# Patient Record
Sex: Female | Born: 1940
Health system: Southern US, Community
[De-identification: ages and names within clinical notes are randomized; demographics above are authoritative.]

## PROBLEM LIST (undated history)

## (undated) ENCOUNTER — Emergency Department (HOSPITAL_COMMUNITY): Admission: EM | Payer: Self-pay | Source: Home / Self Care

## (undated) DIAGNOSIS — Z8719 Personal history of other diseases of the digestive system: Secondary | ICD-10-CM

## (undated) DIAGNOSIS — I82409 Acute embolism and thrombosis of unspecified deep veins of unspecified lower extremity: Secondary | ICD-10-CM

## (undated) DIAGNOSIS — I499 Cardiac arrhythmia, unspecified: Secondary | ICD-10-CM

## (undated) DIAGNOSIS — N3281 Overactive bladder: Secondary | ICD-10-CM

## (undated) DIAGNOSIS — M8580 Other specified disorders of bone density and structure, unspecified site: Secondary | ICD-10-CM

## (undated) DIAGNOSIS — IMO0001 Reserved for inherently not codable concepts without codable children: Secondary | ICD-10-CM

## (undated) DIAGNOSIS — M1990 Unspecified osteoarthritis, unspecified site: Secondary | ICD-10-CM

## (undated) DIAGNOSIS — F419 Anxiety disorder, unspecified: Secondary | ICD-10-CM

## (undated) DIAGNOSIS — Z95818 Presence of other cardiac implants and grafts: Secondary | ICD-10-CM

## (undated) DIAGNOSIS — F3289 Other specified depressive episodes: Secondary | ICD-10-CM

## (undated) DIAGNOSIS — I2699 Other pulmonary embolism without acute cor pulmonale: Secondary | ICD-10-CM

## (undated) DIAGNOSIS — G4730 Sleep apnea, unspecified: Secondary | ICD-10-CM

## (undated) DIAGNOSIS — I4819 Other persistent atrial fibrillation: Secondary | ICD-10-CM

## (undated) DIAGNOSIS — F329 Major depressive disorder, single episode, unspecified: Secondary | ICD-10-CM

## (undated) DIAGNOSIS — J449 Chronic obstructive pulmonary disease, unspecified: Secondary | ICD-10-CM

## (undated) DIAGNOSIS — F4000 Agoraphobia, unspecified: Secondary | ICD-10-CM

## (undated) DIAGNOSIS — E7800 Pure hypercholesterolemia, unspecified: Secondary | ICD-10-CM

## (undated) DIAGNOSIS — I10 Essential (primary) hypertension: Secondary | ICD-10-CM

## (undated) HISTORY — PX: TONSILLECTOMY: SUR1361

## (undated) HISTORY — PX: CATARACT EXTRACTION W/ INTRAOCULAR LENS  IMPLANT, BILATERAL: SHX1307

## (undated) HISTORY — PX: BACK SURGERY: SHX140

## (undated) HISTORY — PX: SHOULDER ARTHROSCOPY W/ ROTATOR CUFF REPAIR: SHX2400

## (undated) HISTORY — DX: Chronic obstructive pulmonary disease, unspecified: J44.9

## (undated) HISTORY — DX: Other specified depressive episodes: F32.89

## (undated) HISTORY — DX: Major depressive disorder, single episode, unspecified: F32.9

## (undated) HISTORY — PX: SHOULDER SURGERY: SHX246

## (undated) HISTORY — DX: Other persistent atrial fibrillation: I48.19

## (undated) HISTORY — PX: WISDOM TOOTH EXTRACTION: SHX21

## (undated) HISTORY — DX: Anxiety disorder, unspecified: F41.9

## (undated) HISTORY — DX: Pure hypercholesterolemia, unspecified: E78.00

## (undated) HISTORY — PX: COLONOSCOPY: SHX174

## (undated) HISTORY — DX: Reserved for inherently not codable concepts without codable children: IMO0001

## (undated) HISTORY — DX: Other specified disorders of bone density and structure, unspecified site: M85.80

## (undated) HISTORY — PX: TOTAL ABDOMINAL HYSTERECTOMY: SHX209

## (undated) HISTORY — DX: Overactive bladder: N32.81

---

## 1981-02-27 HISTORY — PX: APPENDECTOMY: SHX54

## 1999-11-07 ENCOUNTER — Encounter: Payer: Self-pay | Admitting: Emergency Medicine

## 1999-11-07 ENCOUNTER — Emergency Department (HOSPITAL_COMMUNITY): Admission: EM | Admit: 1999-11-07 | Discharge: 1999-11-07 | Payer: Self-pay | Admitting: Emergency Medicine

## 2003-07-20 ENCOUNTER — Encounter: Admission: RE | Admit: 2003-07-20 | Discharge: 2003-07-20 | Payer: Self-pay | Admitting: Internal Medicine

## 2003-09-02 ENCOUNTER — Encounter: Admission: RE | Admit: 2003-09-02 | Discharge: 2003-09-02 | Payer: Self-pay | Admitting: Internal Medicine

## 2005-11-02 ENCOUNTER — Encounter: Admission: RE | Admit: 2005-11-02 | Discharge: 2005-11-02 | Payer: Self-pay | Admitting: Dermatology

## 2005-11-12 ENCOUNTER — Encounter: Admission: RE | Admit: 2005-11-12 | Discharge: 2005-11-12 | Payer: Self-pay | Admitting: Surgery

## 2005-11-12 ENCOUNTER — Inpatient Hospital Stay (HOSPITAL_COMMUNITY): Admission: EM | Admit: 2005-11-12 | Discharge: 2005-11-14 | Payer: Self-pay | Admitting: Emergency Medicine

## 2005-11-12 DIAGNOSIS — S065XAA Traumatic subdural hemorrhage with loss of consciousness status unknown, initial encounter: Secondary | ICD-10-CM

## 2005-11-12 HISTORY — DX: Traumatic subdural hemorrhage with loss of consciousness status unknown, initial encounter: S06.5XAA

## 2005-11-20 ENCOUNTER — Encounter: Admission: RE | Admit: 2005-11-20 | Discharge: 2005-11-20 | Payer: Self-pay | Admitting: Internal Medicine

## 2005-11-29 ENCOUNTER — Encounter: Admission: RE | Admit: 2005-11-29 | Discharge: 2005-11-29 | Payer: Self-pay | Admitting: Neurological Surgery

## 2005-12-22 ENCOUNTER — Encounter: Admission: RE | Admit: 2005-12-22 | Discharge: 2005-12-22 | Payer: Self-pay | Admitting: Neurosurgery

## 2007-10-02 ENCOUNTER — Encounter: Admission: RE | Admit: 2007-10-02 | Discharge: 2007-10-02 | Payer: Self-pay | Admitting: Gastroenterology

## 2007-10-03 ENCOUNTER — Encounter: Admission: RE | Admit: 2007-10-03 | Discharge: 2007-10-03 | Payer: Self-pay | Admitting: Gastroenterology

## 2009-12-02 ENCOUNTER — Encounter: Admission: RE | Admit: 2009-12-02 | Discharge: 2009-12-02 | Payer: Self-pay | Admitting: Neurological Surgery

## 2010-03-19 ENCOUNTER — Encounter: Payer: Self-pay | Admitting: Neurological Surgery

## 2010-07-15 NOTE — H&P (Signed)
NAME:  Judy White, GERKEN NO.:  [id number]   MEDICAL RECORD NO.:  [id number]          PATIENT TYPE:  EMS   LOCATION:  [facility]                         FACILITY:  [facility]   PHYSICIAN:  Hollice Espy, M.D.DATE OF BIRTH:  11-11-1940   DATE OF ADMISSION:  11/12/2005  DATE OF DISCHARGE:                                HISTORY & PHYSICAL   PRIMARY CARE PHYSICIAN:  Darius Bump, M.D.   CONSULTS:  Stefani Dama, M.D., neurosurgery.   CARDIOLOGIST:  Lyn Records, M.D.   CHIEF COMPLAINT:  Headache.   HISTORY OF PRESENT ILLNESS:  Patient is a 70 year old white female with a  past medical history of atrial fibrillation and anxiety, who for the past 3-  4 weeks has been having headaches located in the back of her head but  getting more and more severe for the past 1-2 weeks.  She today noted some  nausea, and she could not take it anymore, came to the emergency room.  In  the emergency room, she had an MRI of the head which showed bilateral  subdural hematomas with some modest mass effect.  Dr. Danielle Dess from  neurosurgery was consulted, who evaluated the patient.  He, in review of the  films, noted that the patient had no neurological findings after examining  the patient, and it was his opinion that she had small bilateral subdural  hematomas that could be monitored.  He felt that the cause of this was  certainly secondary to her Coumadin anticoagulation.  Given the patient's  underlying medical issues of anticoagulation, he discussed the patient with  me, with [facility], and the plan will be to admit the patient to  [facility] service under observation and he will consult.  We will  see if the patient needs surgical intervention.   Currently, the patient is doing well.  She complains of a mild headache, but  she otherwise denies any vision changes, dysphagia, chest pain,  palpitations, shortness of breath, wheeze, cough, abdominal pain, hematuria,  dysuria, constipation, diarrhea, focal extremity numbness, weakness, or  pain.  Review of systems otherwise negative.   Patient's past medical history includes atrial fibrillation and anxiety.  She also has a history of hyperlipidemia and bladder spasm.   MEDICATIONS:  1. Klonopin 1.52 mg p.o. daily p.r.n.  2. Coumadin varying dose.  3. Ditropan XL 10 p.o. daily.  4. Crestor 5 mg p.o. daily.  5. Amiodarone 200 mg daily.   ALLERGIES:  She has no known drug allergies.   SOCIAL HISTORY:  She denies any alcohol or drug use.  She does smoke less  than about a pack per week.   FAMILY HISTORY:  Noncontributory.   PHYSICAL EXAMINATION:  VITAL ON ADMISSION:  Temp 97.3, heart rate 56, blood  pressure 106/62, respirations 16, O2 sat 96% on room air.  GENERAL:  Patient is alert and oriented x3.  No apparent distress.  HEENT:  Normocephalic and atraumatic.  Mucous membranes are slightly dry.  NECK:  She has no carotid bruits.  Cranial nerves II-XII are intact.  HEART:  Regular rate and rhythm.  Normal sinus.  No atrial fibrillation.  LUNGS:  Clear to auscultation bilaterally.  ABDOMEN:  Soft, nontender and nondistended.  Positive bowel sounds.  EXTREMITIES:  No clubbing, cyanosis or edema.  NEUROLOGIC:  She has no neurological findings.   LAB WORK:  White count 9.8, H&H 14.3 and 42, MCV 91, platelet count 238.  No  shift.  Coags are notable for an INR of 2.3, sodium 139, potassium 4.1,  chloride 106, bicarb 28, BUN 17, creatinine 0.8, glucose 98.   ASSESSMENT/PLAN:  1. Subdural hematoma, bilateral:  Conservative measures for now.  Will put      the patient in step-down unit and observe for 24 hours.  Dr. Danielle Dess      plans to get a repeat CT scan in the morning on her.  If it is      negative, possibly the patient could be DC'd to home.  The cause was      secondary to her Coumadin, which will be reversed by anticoagulation by      plasma.  2. Atrial fibrillation:  Fortunately, the  patient is in normal sinus      rhythm her atrial fibrillation and is stabilized with amiodarone.      Would favor, as long as her heart rate stays low, keeping her off      Coumadin.  I have discussed this with Dr. Danielle Dess, and the plan is the      patient can start aspirin, probably in 4-6 weeks.  At this time, I do      not think there is a need for an actual cardiology intervention, as      there will be little to contribute, given that the patient is currently      not in atrial fibrillation.  3. Anxiety:  Continue Klonopin.  4. Hyperlipidemia:  Continue Crestor.      Hollice Espy, M.D.  Electronically Signed     SKK/MEDQ  D:  11/12/2005  T:  11/12/2005  Job:  [id number]   cc:   Stefani Dama, M.D.  Darius Bump, M.D.  Lyn Records, M.D.

## 2010-07-15 NOTE — Consult Note (Signed)
NAME:  Judy White, Judy White NO.:  [id number]   MEDICAL RECORD NO.:  [id number]          PATIENT TYPE:  EMS   LOCATION:  [facility]                         FACILITY:  [facility]   PHYSICIAN:  Stefani Dama, M.D.  DATE OF BIRTH:  [birth year]   DATE OF CONSULTATION:  11/12/2005  DATE OF DISCHARGE:                                   CONSULTATION   REQUESTOR:  Devoria Albe, M.D.   CHIEF COMPLAINT:  Subdural hematoma, headache.   HISTORY OF PRESENT ILLNESS:  The patient is a 70 year old right-handed white  female who has been on Coumadin anticoagulation secondary to atrial  fibrillation.  She has had some headaches for the past 3-4 weeks and it  became severe around Labor Day.  This morning the patient noted some nausea  and she was brought to the emergency department at [facility], were MRI  of the brain was performed demonstrating bilateral subdural hematomas with  modest mass effect.  The hematomas measure approximately 6-7 mm in  thickness.  This morning the patient appears to be in sinus rhythm.  Her INR  was noted be 2.2.  She has no discrete history of trauma.   PAST MEDICAL HISTORY:  Her general health has been good.  Primary care  physician is Dr. Madison Hickman.  Cardiologist is Dr. Garnette Scheuermann.   There are no known allergies.   PHYSICAL EXAMINATION:  Her pupils are 3 mm, briskly reactive to light and  accommodation.  Extraocular movements are full.  Face is symmetric to  grimace.  Tongue and uvula are in midline.  Sclerae and conjunctivae are  clear.  Sensation is intact in all three distributions.  The patient  complains of some mild nausea but has not vomited.  There is no evidence of  any drift, and her motor strength appears symmetric in the upper and lower  extremities.  Deep tendon reflexes are 2+ in the biceps, 1+ in the triceps,  in the patellae and the Achilles.  Babinski's are downgoing.   IMPRESSION:  The patient has small bilateral subdural hematomas  secondary to  Coumadin anticoagulation.   PLAN AT THE CURRENT TIME:  Stop her Coumadin, give her some fresh frozen  plasma and vitamin K.  Cardiology will eventually see the patient.  I  discussed the situation with Dr. Rito Ehrlich, [facility].  They will  admit with I will remain as consultant and follow the patient clinically.  CT scan will be performed in the morning.      Stefani Dama, M.D.  Electronically Signed     HJE/MEDQ  D:  11/12/2005  T:  11/13/2005  Job:  [id number]

## 2010-08-01 ENCOUNTER — Emergency Department (HOSPITAL_COMMUNITY)
Admission: EM | Admit: 2010-08-01 | Discharge: 2010-08-01 | Disposition: A | Discharge status: Home / Self Care | Payer: Medicare Other | Attending: Emergency Medicine | Admitting: Emergency Medicine

## 2010-08-01 ENCOUNTER — Emergency Department (HOSPITAL_COMMUNITY): Payer: Medicare Other

## 2010-08-01 DIAGNOSIS — M546 Pain in thoracic spine: Secondary | ICD-10-CM | POA: Insufficient documentation

## 2010-08-01 DIAGNOSIS — M542 Cervicalgia: Secondary | ICD-10-CM | POA: Insufficient documentation

## 2010-08-01 DIAGNOSIS — R51 Headache: Secondary | ICD-10-CM | POA: Insufficient documentation

## 2010-08-01 DIAGNOSIS — F17200 Nicotine dependence, unspecified, uncomplicated: Secondary | ICD-10-CM | POA: Insufficient documentation

## 2010-08-01 DIAGNOSIS — Z87828 Personal history of other (healed) physical injury and trauma: Secondary | ICD-10-CM | POA: Insufficient documentation

## 2010-08-01 DIAGNOSIS — F411 Generalized anxiety disorder: Secondary | ICD-10-CM | POA: Insufficient documentation

## 2010-08-01 DIAGNOSIS — E785 Hyperlipidemia, unspecified: Secondary | ICD-10-CM | POA: Insufficient documentation

## 2010-08-01 DIAGNOSIS — Z79899 Other long term (current) drug therapy: Secondary | ICD-10-CM | POA: Insufficient documentation

## 2011-03-07 DIAGNOSIS — I4891 Unspecified atrial fibrillation: Secondary | ICD-10-CM | POA: Diagnosis not present

## 2011-03-07 DIAGNOSIS — R079 Chest pain, unspecified: Secondary | ICD-10-CM | POA: Diagnosis not present

## 2011-05-23 DIAGNOSIS — IMO0002 Reserved for concepts with insufficient information to code with codable children: Secondary | ICD-10-CM | POA: Diagnosis not present

## 2011-05-25 DIAGNOSIS — M9981 Other biomechanical lesions of cervical region: Secondary | ICD-10-CM | POA: Diagnosis not present

## 2011-05-25 DIAGNOSIS — M542 Cervicalgia: Secondary | ICD-10-CM | POA: Diagnosis not present

## 2011-05-30 DIAGNOSIS — M542 Cervicalgia: Secondary | ICD-10-CM | POA: Diagnosis not present

## 2011-05-30 DIAGNOSIS — M9981 Other biomechanical lesions of cervical region: Secondary | ICD-10-CM | POA: Diagnosis not present

## 2011-06-01 DIAGNOSIS — M542 Cervicalgia: Secondary | ICD-10-CM | POA: Diagnosis not present

## 2011-06-01 DIAGNOSIS — M9981 Other biomechanical lesions of cervical region: Secondary | ICD-10-CM | POA: Diagnosis not present

## 2011-06-02 DIAGNOSIS — H02839 Dermatochalasis of unspecified eye, unspecified eyelid: Secondary | ICD-10-CM | POA: Diagnosis not present

## 2011-06-02 DIAGNOSIS — H2510 Age-related nuclear cataract, unspecified eye: Secondary | ICD-10-CM | POA: Diagnosis not present

## 2011-06-13 DIAGNOSIS — Z0000 Encounter for general adult medical examination without abnormal findings: Secondary | ICD-10-CM | POA: Diagnosis not present

## 2011-06-13 DIAGNOSIS — M899 Disorder of bone, unspecified: Secondary | ICD-10-CM | POA: Diagnosis not present

## 2011-06-13 DIAGNOSIS — M949 Disorder of cartilage, unspecified: Secondary | ICD-10-CM | POA: Diagnosis not present

## 2011-06-13 DIAGNOSIS — E7800 Pure hypercholesterolemia, unspecified: Secondary | ICD-10-CM | POA: Diagnosis not present

## 2011-06-13 DIAGNOSIS — Z79899 Other long term (current) drug therapy: Secondary | ICD-10-CM | POA: Diagnosis not present

## 2011-06-13 DIAGNOSIS — M542 Cervicalgia: Secondary | ICD-10-CM | POA: Diagnosis not present

## 2011-06-13 DIAGNOSIS — Z1331 Encounter for screening for depression: Secondary | ICD-10-CM | POA: Diagnosis not present

## 2011-06-27 DIAGNOSIS — Z1231 Encounter for screening mammogram for malignant neoplasm of breast: Secondary | ICD-10-CM | POA: Diagnosis not present

## 2011-07-04 DIAGNOSIS — H2510 Age-related nuclear cataract, unspecified eye: Secondary | ICD-10-CM | POA: Diagnosis not present

## 2011-07-27 DIAGNOSIS — IMO0002 Reserved for concepts with insufficient information to code with codable children: Secondary | ICD-10-CM | POA: Diagnosis not present

## 2011-07-27 DIAGNOSIS — H25019 Cortical age-related cataract, unspecified eye: Secondary | ICD-10-CM | POA: Diagnosis not present

## 2011-07-27 DIAGNOSIS — H2510 Age-related nuclear cataract, unspecified eye: Secondary | ICD-10-CM | POA: Diagnosis not present

## 2011-08-03 DIAGNOSIS — IMO0002 Reserved for concepts with insufficient information to code with codable children: Secondary | ICD-10-CM | POA: Diagnosis not present

## 2011-08-03 DIAGNOSIS — H2510 Age-related nuclear cataract, unspecified eye: Secondary | ICD-10-CM | POA: Diagnosis not present

## 2011-09-12 DIAGNOSIS — M542 Cervicalgia: Secondary | ICD-10-CM | POA: Diagnosis not present

## 2011-09-12 DIAGNOSIS — M949 Disorder of cartilage, unspecified: Secondary | ICD-10-CM | POA: Diagnosis not present

## 2011-09-12 DIAGNOSIS — I10 Essential (primary) hypertension: Secondary | ICD-10-CM | POA: Diagnosis not present

## 2011-09-12 DIAGNOSIS — E7800 Pure hypercholesterolemia, unspecified: Secondary | ICD-10-CM | POA: Diagnosis not present

## 2011-09-12 DIAGNOSIS — I4891 Unspecified atrial fibrillation: Secondary | ICD-10-CM | POA: Diagnosis not present

## 2011-09-12 DIAGNOSIS — R079 Chest pain, unspecified: Secondary | ICD-10-CM | POA: Diagnosis not present

## 2011-09-12 DIAGNOSIS — Z79899 Other long term (current) drug therapy: Secondary | ICD-10-CM | POA: Diagnosis not present

## 2011-09-12 DIAGNOSIS — M899 Disorder of bone, unspecified: Secondary | ICD-10-CM | POA: Diagnosis not present

## 2011-09-12 DIAGNOSIS — Z0000 Encounter for general adult medical examination without abnormal findings: Secondary | ICD-10-CM | POA: Diagnosis not present

## 2011-10-12 DIAGNOSIS — IMO0002 Reserved for concepts with insufficient information to code with codable children: Secondary | ICD-10-CM | POA: Diagnosis not present

## 2011-10-31 DIAGNOSIS — M67919 Unspecified disorder of synovium and tendon, unspecified shoulder: Secondary | ICD-10-CM | POA: Diagnosis not present

## 2011-10-31 DIAGNOSIS — M719 Bursopathy, unspecified: Secondary | ICD-10-CM | POA: Diagnosis not present

## 2011-12-01 DIAGNOSIS — Z23 Encounter for immunization: Secondary | ICD-10-CM | POA: Diagnosis not present

## 2011-12-05 DIAGNOSIS — IMO0002 Reserved for concepts with insufficient information to code with codable children: Secondary | ICD-10-CM | POA: Diagnosis not present

## 2011-12-12 DIAGNOSIS — M542 Cervicalgia: Secondary | ICD-10-CM | POA: Diagnosis not present

## 2011-12-22 DIAGNOSIS — L820 Inflamed seborrheic keratosis: Secondary | ICD-10-CM | POA: Diagnosis not present

## 2011-12-22 DIAGNOSIS — D239 Other benign neoplasm of skin, unspecified: Secondary | ICD-10-CM | POA: Diagnosis not present

## 2011-12-22 DIAGNOSIS — L821 Other seborrheic keratosis: Secondary | ICD-10-CM | POA: Diagnosis not present

## 2011-12-22 DIAGNOSIS — D1801 Hemangioma of skin and subcutaneous tissue: Secondary | ICD-10-CM | POA: Diagnosis not present

## 2011-12-22 DIAGNOSIS — L819 Disorder of pigmentation, unspecified: Secondary | ICD-10-CM | POA: Diagnosis not present

## 2012-03-12 DIAGNOSIS — I10 Essential (primary) hypertension: Secondary | ICD-10-CM | POA: Diagnosis not present

## 2012-03-12 DIAGNOSIS — Z79899 Other long term (current) drug therapy: Secondary | ICD-10-CM | POA: Diagnosis not present

## 2012-03-12 DIAGNOSIS — Z7901 Long term (current) use of anticoagulants: Secondary | ICD-10-CM | POA: Diagnosis not present

## 2012-03-12 DIAGNOSIS — F411 Generalized anxiety disorder: Secondary | ICD-10-CM | POA: Diagnosis not present

## 2012-03-12 DIAGNOSIS — I4891 Unspecified atrial fibrillation: Secondary | ICD-10-CM | POA: Diagnosis not present

## 2012-03-12 DIAGNOSIS — E782 Mixed hyperlipidemia: Secondary | ICD-10-CM | POA: Diagnosis not present

## 2012-03-13 DIAGNOSIS — IMO0002 Reserved for concepts with insufficient information to code with codable children: Secondary | ICD-10-CM | POA: Diagnosis not present

## 2012-05-06 DIAGNOSIS — M542 Cervicalgia: Secondary | ICD-10-CM | POA: Diagnosis not present

## 2012-06-12 DIAGNOSIS — L820 Inflamed seborrheic keratosis: Secondary | ICD-10-CM | POA: Diagnosis not present

## 2012-06-12 DIAGNOSIS — D2360 Other benign neoplasm of skin of unspecified upper limb, including shoulder: Secondary | ICD-10-CM | POA: Diagnosis not present

## 2012-06-18 DIAGNOSIS — Z1331 Encounter for screening for depression: Secondary | ICD-10-CM | POA: Diagnosis not present

## 2012-06-18 DIAGNOSIS — M47812 Spondylosis without myelopathy or radiculopathy, cervical region: Secondary | ICD-10-CM | POA: Diagnosis not present

## 2012-06-18 DIAGNOSIS — M949 Disorder of cartilage, unspecified: Secondary | ICD-10-CM | POA: Diagnosis not present

## 2012-06-18 DIAGNOSIS — F17200 Nicotine dependence, unspecified, uncomplicated: Secondary | ICD-10-CM | POA: Diagnosis not present

## 2012-06-18 DIAGNOSIS — E782 Mixed hyperlipidemia: Secondary | ICD-10-CM | POA: Diagnosis not present

## 2012-06-18 DIAGNOSIS — N318 Other neuromuscular dysfunction of bladder: Secondary | ICD-10-CM | POA: Diagnosis not present

## 2012-06-18 DIAGNOSIS — Z79899 Other long term (current) drug therapy: Secondary | ICD-10-CM | POA: Diagnosis not present

## 2012-06-18 DIAGNOSIS — Z0000 Encounter for general adult medical examination without abnormal findings: Secondary | ICD-10-CM | POA: Diagnosis not present

## 2012-06-18 DIAGNOSIS — M899 Disorder of bone, unspecified: Secondary | ICD-10-CM | POA: Diagnosis not present

## 2012-06-18 DIAGNOSIS — I4891 Unspecified atrial fibrillation: Secondary | ICD-10-CM | POA: Diagnosis not present

## 2012-08-01 DIAGNOSIS — Z1231 Encounter for screening mammogram for malignant neoplasm of breast: Secondary | ICD-10-CM | POA: Diagnosis not present

## 2012-08-01 DIAGNOSIS — Z803 Family history of malignant neoplasm of breast: Secondary | ICD-10-CM | POA: Diagnosis not present

## 2012-08-14 DIAGNOSIS — IMO0002 Reserved for concepts with insufficient information to code with codable children: Secondary | ICD-10-CM | POA: Diagnosis not present

## 2012-09-11 DIAGNOSIS — E782 Mixed hyperlipidemia: Secondary | ICD-10-CM | POA: Diagnosis not present

## 2012-09-11 DIAGNOSIS — Z7901 Long term (current) use of anticoagulants: Secondary | ICD-10-CM | POA: Diagnosis not present

## 2012-09-11 DIAGNOSIS — Z79899 Other long term (current) drug therapy: Secondary | ICD-10-CM | POA: Diagnosis not present

## 2012-09-11 DIAGNOSIS — I10 Essential (primary) hypertension: Secondary | ICD-10-CM | POA: Diagnosis not present

## 2012-09-11 DIAGNOSIS — I4891 Unspecified atrial fibrillation: Secondary | ICD-10-CM | POA: Diagnosis not present

## 2012-09-11 DIAGNOSIS — M542 Cervicalgia: Secondary | ICD-10-CM | POA: Diagnosis not present

## 2012-09-11 DIAGNOSIS — F411 Generalized anxiety disorder: Secondary | ICD-10-CM | POA: Diagnosis not present

## 2012-09-13 DIAGNOSIS — H02409 Unspecified ptosis of unspecified eyelid: Secondary | ICD-10-CM | POA: Diagnosis not present

## 2012-09-13 DIAGNOSIS — H52209 Unspecified astigmatism, unspecified eye: Secondary | ICD-10-CM | POA: Diagnosis not present

## 2012-09-13 DIAGNOSIS — H2640 Unspecified secondary cataract: Secondary | ICD-10-CM | POA: Diagnosis not present

## 2012-10-10 DIAGNOSIS — H02409 Unspecified ptosis of unspecified eyelid: Secondary | ICD-10-CM | POA: Diagnosis not present

## 2012-10-10 DIAGNOSIS — H53459 Other localized visual field defect, unspecified eye: Secondary | ICD-10-CM | POA: Diagnosis not present

## 2012-12-12 DIAGNOSIS — Z23 Encounter for immunization: Secondary | ICD-10-CM | POA: Diagnosis not present

## 2012-12-20 DIAGNOSIS — L819 Disorder of pigmentation, unspecified: Secondary | ICD-10-CM | POA: Diagnosis not present

## 2012-12-20 DIAGNOSIS — L820 Inflamed seborrheic keratosis: Secondary | ICD-10-CM | POA: Diagnosis not present

## 2012-12-20 DIAGNOSIS — L821 Other seborrheic keratosis: Secondary | ICD-10-CM | POA: Diagnosis not present

## 2012-12-20 DIAGNOSIS — D1801 Hemangioma of skin and subcutaneous tissue: Secondary | ICD-10-CM | POA: Diagnosis not present

## 2012-12-20 DIAGNOSIS — D239 Other benign neoplasm of skin, unspecified: Secondary | ICD-10-CM | POA: Diagnosis not present

## 2013-01-09 DIAGNOSIS — IMO0002 Reserved for concepts with insufficient information to code with codable children: Secondary | ICD-10-CM | POA: Diagnosis not present

## 2013-03-16 ENCOUNTER — Other Ambulatory Visit: Payer: Self-pay | Admitting: *Deleted

## 2013-03-16 DIAGNOSIS — Z79899 Other long term (current) drug therapy: Secondary | ICD-10-CM

## 2013-03-25 ENCOUNTER — Encounter: Payer: Self-pay | Admitting: *Deleted

## 2013-03-25 ENCOUNTER — Encounter: Payer: Self-pay | Admitting: Interventional Cardiology

## 2013-03-25 DIAGNOSIS — M129 Arthropathy, unspecified: Secondary | ICD-10-CM | POA: Insufficient documentation

## 2013-03-25 DIAGNOSIS — IMO0002 Reserved for concepts with insufficient information to code with codable children: Secondary | ICD-10-CM | POA: Insufficient documentation

## 2013-03-25 DIAGNOSIS — I4891 Unspecified atrial fibrillation: Secondary | ICD-10-CM | POA: Insufficient documentation

## 2013-03-25 DIAGNOSIS — F419 Anxiety disorder, unspecified: Secondary | ICD-10-CM | POA: Insufficient documentation

## 2013-03-25 DIAGNOSIS — M8580 Other specified disorders of bone density and structure, unspecified site: Secondary | ICD-10-CM | POA: Insufficient documentation

## 2013-03-25 DIAGNOSIS — E7800 Pure hypercholesterolemia, unspecified: Secondary | ICD-10-CM | POA: Insufficient documentation

## 2013-03-25 DIAGNOSIS — R079 Chest pain, unspecified: Secondary | ICD-10-CM | POA: Insufficient documentation

## 2013-03-25 DIAGNOSIS — F3289 Other specified depressive episodes: Secondary | ICD-10-CM | POA: Insufficient documentation

## 2013-03-25 DIAGNOSIS — J449 Chronic obstructive pulmonary disease, unspecified: Secondary | ICD-10-CM | POA: Insufficient documentation

## 2013-03-25 DIAGNOSIS — N3281 Overactive bladder: Secondary | ICD-10-CM | POA: Insufficient documentation

## 2013-03-25 DIAGNOSIS — F329 Major depressive disorder, single episode, unspecified: Secondary | ICD-10-CM | POA: Insufficient documentation

## 2013-03-25 DIAGNOSIS — IMO0001 Reserved for inherently not codable concepts without codable children: Secondary | ICD-10-CM | POA: Insufficient documentation

## 2013-03-25 DIAGNOSIS — I10 Essential (primary) hypertension: Secondary | ICD-10-CM | POA: Insufficient documentation

## 2013-03-27 DIAGNOSIS — M79609 Pain in unspecified limb: Secondary | ICD-10-CM | POA: Diagnosis not present

## 2013-03-31 ENCOUNTER — Other Ambulatory Visit: Payer: Medicare Other

## 2013-04-03 ENCOUNTER — Encounter (INDEPENDENT_AMBULATORY_CARE_PROVIDER_SITE_OTHER): Payer: Self-pay

## 2013-04-03 ENCOUNTER — Ambulatory Visit (INDEPENDENT_AMBULATORY_CARE_PROVIDER_SITE_OTHER): Payer: Medicare Other | Admitting: Interventional Cardiology

## 2013-04-03 ENCOUNTER — Ambulatory Visit
Admission: RE | Admit: 2013-04-03 | Discharge: 2013-04-03 | Disposition: A | Discharge status: Home / Self Care | Payer: Medicare Other | Source: Ambulatory Visit | Attending: Interventional Cardiology | Admitting: Interventional Cardiology

## 2013-04-03 ENCOUNTER — Encounter: Payer: Self-pay | Admitting: Interventional Cardiology

## 2013-04-03 ENCOUNTER — Other Ambulatory Visit: Payer: Medicare Other

## 2013-04-03 VITALS — BP 118/70 | HR 87 | Ht 66.5 in | Wt 218.0 lb

## 2013-04-03 DIAGNOSIS — I10 Essential (primary) hypertension: Secondary | ICD-10-CM

## 2013-04-03 DIAGNOSIS — E7800 Pure hypercholesterolemia, unspecified: Secondary | ICD-10-CM | POA: Diagnosis not present

## 2013-04-03 DIAGNOSIS — I4891 Unspecified atrial fibrillation: Secondary | ICD-10-CM | POA: Diagnosis not present

## 2013-04-03 DIAGNOSIS — J449 Chronic obstructive pulmonary disease, unspecified: Secondary | ICD-10-CM | POA: Diagnosis not present

## 2013-04-03 DIAGNOSIS — Z79899 Other long term (current) drug therapy: Secondary | ICD-10-CM

## 2013-04-03 LAB — HEPATIC FUNCTION PANEL
ALK PHOS: 69 U/L (ref 39–117)
ALT: 12 U/L (ref 0–35)
AST: 14 U/L (ref 0–37)
Albumin: 3.9 g/dL (ref 3.5–5.2)
Bilirubin, Direct: 0 mg/dL (ref 0.0–0.3)
Total Bilirubin: 0.4 mg/dL (ref 0.3–1.2)
Total Protein: 7 g/dL (ref 6.0–8.3)

## 2013-04-03 LAB — TSH: TSH: 0.98 u[IU]/mL (ref 0.35–5.50)

## 2013-04-03 MED ORDER — AMIODARONE HCL 200 MG PO TABS: 200.0000 mg | ORAL_TABLET | Freq: Every day | ORAL | Status: DC

## 2013-04-03 NOTE — Progress Notes (Signed)
Patient ID: Judy White, female   DOB: September 07, 1940, 73 y.o.   MRN: [id number] Past Medical History  History of subarachnoid hemorrhage, 2007, while on Coumadin (subsequently discontinued) - Dr. Ellene Route   Atypical chest pain with negative cardiac catheterization in 1997 and negative Cardiolite studies in 2000, Dec. , 2009.   Osteopenia   Urinary frequency/overactive bladder   Mild COPD - continues to smoke   Paroxysmal atrial fibrillation (2007) - Dr. Tamala Julian   Hyperlipidemia    Agaraphobia - Dr. Molli Knock   ophth - [facility]   overactive bladder - Dr. Diona Fanti   osteopenia (2011 BMD T score -.16 FRAX 2.8% and 11%)      [address] [place], [place]  [address] Phone: [phone] Fax:  [phone]  Date:  04/03/2013   ID:  Judy White, DOB [birth year], MRN [id number]  PCP:  No primary provider on file.   ASSESSMENT:  1. Paroxysmal atrial fibrillation, currently present on low-dose amiodarone 2. Unable to use anticoagulation therapy because of intracranial hemorrhage in the past 3. Hypertension controlled 4. Amiodarone therapy  PLAN:  1. Increase amiodarone to 200 mg daily 2. Return to see me in 6 weeks with an EKG 3. TSH, hepatic panel, and chest x-ray today.   SUBJECTIVE: Judy White is a 73 y.o. female who has noticed increased episodes of palpitations since I last saw her. She is on amiodarone 100 mg per day. She at previously discontinued anticoagulation therapy because of intracranial hemorrhage. We decreased amiodarone dose because of side effects. Side effects were suspected but never documented to be amiodarone related. These included dizziness and weakness.   Wt Readings from Last 3 Encounters:  04/03/13 218 lb (98.884 kg)     Past Medical History  Diagnosis Date  . Hypercholesteremia   . Osteopenia   . Chest pain   . Onychia   . HTN (hypertension)   . Depressive disorder, not elsewhere classified   . Anxiety   . Atrial  fibrillation   . Unspecified arthropathy, multiple sites   . Myalgia and myositis, unspecified   . COPD (chronic obstructive pulmonary disease)   . Overactive bladder     Current Outpatient Prescriptions  Medication Sig Dispense Refill  . amiodarone (PACERONE) 200 MG tablet Take 0.5 tablets by mouth daily.      . Ascorbic Acid (VITAMIN C PO) Take by mouth daily.      Marland Kitchen aspirin 81 MG tablet Take 81 mg by mouth daily.      . Calcium-Vitamin D (CALTRATE 600 PLUS-VIT D PO) Take by mouth daily.      . Cholecalciferol (VITAMIN D PO) Take by mouth daily.      . clonazePAM (KLONOPIN) 1 MG tablet Take 1 tablet by mouth 2 (two) times daily.      . Cyanocobalamin (VITAMIN B-12 PO) Take by mouth daily.      Marland Kitchen FLUoxetine (PROZAC) 20 MG capsule Take 1 capsule by mouth daily.      . Omega-3 Fatty Acids (FISH OIL PO) Take by mouth daily.      Marland Kitchen oxybutynin (DITROPAN-XL) 10 MG 24 hr tablet Take 1 tablet by mouth daily.       No current facility-administered medications for this visit.    Allergies:   Allergies not on file  Social History:  The patient  reports that she has been smoking.  She does not have any smokeless tobacco history on file. She reports that she drinks alcohol.  She reports that she does not use illicit drugs.   ROS:  Please see the history of present illness.      All other systems reviewed and negative.   OBJECTIVE: VS:  BP 118/70  Pulse 87  Ht 5' 6.5" (1.689 m)  Wt 218 lb (98.884 kg)  BMI 34.66 kg/m2 Well nourished, well developed, in no acute distress HEENT: normal Neck: JVD flat. Carotid bruit absent  Cardiac:  normal S1, S2; RRR; no murmur Lungs:  clear to auscultation bilaterally, no wheezing, rhonchi or rales Abd: soft, nontender, no hepatomegaly Ext: Edema absent. Pulses 2+ bilateral Skin: warm and dry Neuro:  CNs 2-12 intact, no focal abnormalities noted  EKG:  Atrial fibrillation with moderate rate control       Signed, Illene Labrador III, MD 04/03/2013  2:23 PM

## 2013-04-03 NOTE — Patient Instructions (Signed)
Your physician has recommended you make the following change in your medication:  1) Increase Amiodarone to 200mg  daily Take all other medication as prescribed  Lab today: Tsh, Lft  A chest x-ray takes a picture of the organs and structures inside the chest, including the heart, lungs, and blood vessels. This test can show several things, including, whether the heart is enlarges; whether fluid is building up in the lungs; and whether pacemaker / defibrillator leads are still in place.( To be done at [facility]. [address])   You have a appt scheduled for 05/21/13 @1 :15pm  Your physician wants you to follow-up in: 6 months You will receive a reminder letter in the mail two months in advance. If you don't receive a letter, please call our office to schedule the follow-up appointment.  Your physician recommends that you return for lab work in: 6 months Tsh, Lft...chest xray

## 2013-04-08 ENCOUNTER — Telehealth: Payer: Self-pay

## 2013-04-08 NOTE — Telephone Encounter (Signed)
Message copied by Lamar Laundry on Tue Apr 08, 2013  2:49 PM ------      Message from: [name]      Created: Fri Apr 04, 2013 11:07 AM       No acute abnormality noted ------

## 2013-04-08 NOTE — Telephone Encounter (Signed)
lmom.chest xray results.  No acute abnormality noted

## 2013-04-14 ENCOUNTER — Telehealth: Payer: Self-pay

## 2013-04-14 NOTE — Telephone Encounter (Signed)
Message copied by Lamar Laundry on Mon Apr 14, 2013  1:28 PM ------      Message from: [name]      Created: Fri Apr 04, 2013 11:17 AM       Normal liver and thyroid studies ------

## 2013-04-14 NOTE — Telephone Encounter (Signed)
lmom.lab results.Normal liver and thyroid studies

## 2013-05-21 ENCOUNTER — Ambulatory Visit (INDEPENDENT_AMBULATORY_CARE_PROVIDER_SITE_OTHER): Payer: Medicare Other | Admitting: Interventional Cardiology

## 2013-05-21 ENCOUNTER — Encounter: Payer: Self-pay | Admitting: Interventional Cardiology

## 2013-05-21 VITALS — BP 122/70 | HR 52 | Ht 66.0 in | Wt 184.0 lb

## 2013-05-21 DIAGNOSIS — R079 Chest pain, unspecified: Secondary | ICD-10-CM | POA: Diagnosis not present

## 2013-05-21 DIAGNOSIS — R0789 Other chest pain: Secondary | ICD-10-CM

## 2013-05-21 DIAGNOSIS — I10 Essential (primary) hypertension: Secondary | ICD-10-CM

## 2013-05-21 DIAGNOSIS — I4891 Unspecified atrial fibrillation: Secondary | ICD-10-CM | POA: Diagnosis not present

## 2013-05-21 DIAGNOSIS — Z79899 Other long term (current) drug therapy: Secondary | ICD-10-CM | POA: Insufficient documentation

## 2013-05-21 NOTE — Patient Instructions (Addendum)
Your physician recommends that you continue on your current medications as directed. Please refer to the Current Medication list given to you today.  Your physician has requested that you have en exercise stress myoview. For further information please visit [email or web address]. Please follow instruction sheet, as given.  Your physician wants you to follow-up in: 4-5 months You will receive a reminder letter in the mail two months in advance. If you don't receive a letter, please call our office to schedule the follow-up appointment.

## 2013-05-21 NOTE — Progress Notes (Signed)
Patient ID: Judy White, female   DOB: 10-20-1940, 73 y.o.   MRN: [id number]    [address], [address] [place], [place]  [address] Phone: [phone] Fax:  [phone]  Date:  05/21/2013   ID:  LAKYLA BISWAS, DOB November 11, 1940, MRN [id number]  PCP:  No primary provider on file.   ASSESSMENT:  1. Paroxysmal atrial fibrillation 2. Chest tightness, exertional. Previous catheterization in 2 nuclear studies were unremarkable. Last evaluation was 2009 3. Amiodarone therapy 4. Previous intracranial bleed, therefore no anticoagulation therapy  PLAN:  1. Pharmacologic nuclear study to rule out CAD 2. Continue amiodarone at 200 mg per day 3. Six-month clinical followup or earlier if abnormalities noted on nuclear testing   SUBJECTIVE: Judy White is a 73 y.o. female who continues to have complaints of exertional and rest chest tightness. It is difficult to sort out whether the symptoms are occurring only with palpitations or at other times. She feels that something is wrong. She feels as though there is a weight on her chest. Prior coronary angiography in 1997 was normal. 2 prior nuclear studies for similar complaints, most recently in 2009, were without evidence of ischemia. She denies orthopnea and PND. She has not had syncope. She does not believe the increased dose of amiodarone in suppressing episodes of atrial fibrillation.   Wt Readings from Last 3 Encounters:  05/21/13 184 lb (83.462 kg)  04/03/13 218 lb (98.884 kg)     Past Medical History  Diagnosis Date  . Hypercholesteremia   . Osteopenia   . Chest pain   . Onychia   . HTN (hypertension)   . Depressive disorder, not elsewhere classified   . Anxiety   . Atrial fibrillation   . Unspecified arthropathy, multiple sites   . Myalgia and myositis, unspecified   . COPD (chronic obstructive pulmonary disease)   . Overactive bladder     Current Outpatient Prescriptions  Medication Sig Dispense Refill  . amiodarone (PACERONE)  200 MG tablet Take 1 tablet (200 mg total) by mouth daily.  90 tablet  3  . Ascorbic Acid (VITAMIN C PO) Take by mouth daily.      Marland Kitchen aspirin 81 MG tablet Take 81 mg by mouth daily.      . Calcium-Vitamin D (CALTRATE 600 PLUS-VIT D PO) Take by mouth daily.      . Cholecalciferol (VITAMIN D PO) Take by mouth daily.      . clonazePAM (KLONOPIN) 1 MG tablet Take 1 tablet by mouth 2 (two) times daily.      . Cyanocobalamin (VITAMIN B-12 PO) Take by mouth daily.      Marland Kitchen FLUoxetine (PROZAC) 20 MG capsule Take 1 capsule by mouth daily.      . Omega-3 Fatty Acids (FISH OIL PO) Take by mouth daily.      Marland Kitchen oxybutynin (DITROPAN-XL) 10 MG 24 hr tablet Take 1 tablet by mouth daily.       No current facility-administered medications for this visit.    Allergies:   Not on File  Social History:  The patient  reports that she has been smoking.  She does not have any smokeless tobacco history on file. She reports that she drinks alcohol. She reports that she does not use illicit drugs.   ROS:  Please see the history of present illness.   She denies syncope. No lower extremity swelling.   All other systems reviewed and negative.   OBJECTIVE: VS:  BP 122/70  Pulse 52  Ht 5\' 6"  (1.676 m)  Wt 184 lb (83.462 kg)  BMI 29.71 kg/m2 Well nourished, well developed, in no acute distress, elderly HEENT: normal Neck: JVD flat. Carotid bruit absent  Cardiac:  normal S1, S2; RRR; no murmur Lungs:  clear to auscultation bilaterally, no wheezing, rhonchi or rales Abd: soft, nontender, no hepatomegaly Ext: Edema absent. Pulses 2+ and symmetric Skin: warm and dry Neuro:  CNs 2-12 intact, no focal abnormalities noted  EKG:  Sinus bradycardia at 50 beats per minute with left atrial abnormality. Otherwise normal       Signed, [place], MD 05/21/2013 1:44 PM

## 2013-05-28 ENCOUNTER — Telehealth (HOSPITAL_COMMUNITY): Payer: Self-pay

## 2013-05-29 ENCOUNTER — Telehealth (HOSPITAL_COMMUNITY): Payer: Self-pay

## 2013-05-30 ENCOUNTER — Ambulatory Visit (HOSPITAL_COMMUNITY)
Admission: RE | Admit: 2013-05-30 | Discharge: 2013-05-30 | Disposition: A | Discharge status: Home / Self Care | Payer: Medicare Other | Source: Ambulatory Visit | Attending: Cardiovascular Disease | Admitting: Cardiovascular Disease

## 2013-05-30 DIAGNOSIS — R079 Chest pain, unspecified: Secondary | ICD-10-CM

## 2013-05-30 DIAGNOSIS — R0789 Other chest pain: Secondary | ICD-10-CM | POA: Insufficient documentation

## 2013-05-30 MED ORDER — TECHNETIUM TC 99M SESTAMIBI GENERIC - CARDIOLITE
30.9000 | Freq: Once | INTRAVENOUS | Status: AC | PRN
  Administered 2013-05-30: 30.9 via INTRAVENOUS

## 2013-05-30 MED ORDER — TECHNETIUM TC 99M SESTAMIBI GENERIC - CARDIOLITE
10.8000 | Freq: Once | INTRAVENOUS | Status: AC | PRN
Start: 2013-05-30 — End: 2013-05-30
  Administered 2013-05-30: 11 via INTRAVENOUS

## 2013-05-30 NOTE — Procedures (Addendum)
[facility] [address] [place] [address] [phone]  Cardiology Nuclear Med Study  Judy White is White 73 y.o. female     MRN : [id number]     DOB: [birth year]  Procedure Date: 05/30/2013  Nuclear Med Background Indication for Stress Test:  Evaluation for Ischemia and Abnormal EKG History:  AFIB;Prior NUC study in 2009-no results for comparrison. Cardiac Risk Factors: Hypertension, Lipids, Overweight, Smoker and AFIB  Symptoms:  Chest Pain, DOE, Fatigue, Palpitations and SOB   Nuclear Pre-Procedure Caffeine/Decaff Intake:  7:00pm NPO After: 5:00am   IV Site: R Forearm  IV 0.9% NS with Angio Cath:  22g  Chest Size (in):  n/White IV Started by: Azucena Cecil, RN  Height: 5\' 6"  (1.676 m)  Cup Size: C  BMI:  Body mass index is 29.71 kg/(m^2). Weight:  184 lb (83.462 kg)   Tech Comments:  n/White    Nuclear Med Study 1 or 2 day study: 1 day  Stress Test Type:  Stress  Order Authorizing Provider:  [name], MD   Resting Radionuclide: Technetium 58m Sestamibi  Resting Radionuclide Dose: 10.8 mCi   Stress Radionuclide:  Technetium 70m Sestamibi  Stress Radionuclide Dose: 30.9 mCi           Stress Protocol Rest HR: 51 Stress HR: 126  Rest BP: 124/76 Stress BP: 147/62  Exercise Time (min): 6 METS: 7.5   Predicted Max HR: 146 bpm % Max HR: 86.3 bpm Rate Pressure Product: 00349  Dose of Adenosine (mg):  n/White Dose of Lexiscan: n/White mg  Dose of Atropine (mg): n/White Dose of Dobutamine: n/White mcg/kg/min (at max HR)  Stress Test Technologist: Leane Para, CCT Nuclear Technologist: Imagene Riches, CNMT   Rest Procedure:  Myocardial perfusion imaging was performed at rest 45 minutes following the intravenous administration of Technetium 70m Sestamibi. Stress Procedure:  The patient performed treadmill exercise using [place]  Protocol for 6 minutes. The patient stopped due to SOB, Fatigue and leg discomfort and denied any chest pain.   There were no significant ST-T wave changes.  Technetium 26m Sestamibi was injected at peak exercise and myocardial perfusion imaging was performed after White brief delay.  Transient Ischemic Dilatation (Normal <1.22):  0.87 Lung/Heart Ratio (Normal <0.45):  0.17 QGS EDV:  63 ml QGS ESV:  15 ml LV Ejection Fraction: 76%       Rest ECG: NSR - Normal EKG  Stress ECG: No significant change from baseline ECG  QPS Raw Data Images:  Normal; no motion artifact; normal heart/lung ratio. Stress Images:  Normal homogeneous uptake in all areas of the myocardium. Rest Images:  Normal homogeneous uptake in all areas of the myocardium. Subtraction (SDS):  Normal  Impression Exercise Capacity:  Good exercise capacity. BP Response:  Normal blood pressure response. Clinical Symptoms:  Mild shotness of breath ECG Impression:  No significant ST segment change suggestive of ischemia. Comparison with Prior Nuclear Study: No images to compare  Overall Impression:  Normal stress nuclear study.  LV Wall Motion:  NL LV Function, EF 76%; NL Wall Motion   Judy Julian A, MD  05/30/2013 12:11 PM

## 2013-06-02 ENCOUNTER — Telehealth: Payer: Self-pay

## 2013-06-02 NOTE — Telephone Encounter (Signed)
Message copied by Lamar Laundry on Mon Jun 02, 2013 12:52 PM ------      Message from: [name]      Created: Fri May 30, 2013  3:24 PM       Stress test is normal so we don't think that chest pain is from the heart. ------

## 2013-06-02 NOTE — Telephone Encounter (Signed)
pt called back.pt given myoview results.  Stress test is normal so we don't think that chest pain is from the heart.pt verbalized understanding.

## 2013-06-02 NOTE — Telephone Encounter (Signed)
Message copied by Lamar Laundry on Mon Jun 02, 2013 12:51 PM ------      Message from: [name]      Created: Fri May 30, 2013  3:24 PM       Stress test is normal so we don't think that chest pain is from the heart. ------

## 2013-06-02 NOTE — Telephone Encounter (Signed)
called to give pt myoview results.lmom for pt to callback

## 2013-06-02 NOTE — Telephone Encounter (Signed)
Message copied by Lamar Laundry on Mon Jun 02, 2013  1:04 PM ------      Message from: [name]      Created: Fri May 30, 2013  3:24 PM       Stress test is normal so we don't think that chest pain is from the heart. ------

## 2013-06-05 DIAGNOSIS — IMO0002 Reserved for concepts with insufficient information to code with codable children: Secondary | ICD-10-CM | POA: Diagnosis not present

## 2013-06-23 DIAGNOSIS — Z0000 Encounter for general adult medical examination without abnormal findings: Secondary | ICD-10-CM | POA: Diagnosis not present

## 2013-06-23 DIAGNOSIS — F17200 Nicotine dependence, unspecified, uncomplicated: Secondary | ICD-10-CM | POA: Diagnosis not present

## 2013-06-23 DIAGNOSIS — E782 Mixed hyperlipidemia: Secondary | ICD-10-CM | POA: Diagnosis not present

## 2013-06-23 DIAGNOSIS — Z79899 Other long term (current) drug therapy: Secondary | ICD-10-CM | POA: Diagnosis not present

## 2013-06-23 DIAGNOSIS — Z1331 Encounter for screening for depression: Secondary | ICD-10-CM | POA: Diagnosis not present

## 2013-06-23 DIAGNOSIS — M899 Disorder of bone, unspecified: Secondary | ICD-10-CM | POA: Diagnosis not present

## 2013-06-23 DIAGNOSIS — Z23 Encounter for immunization: Secondary | ICD-10-CM | POA: Diagnosis not present

## 2013-07-09 DIAGNOSIS — M899 Disorder of bone, unspecified: Secondary | ICD-10-CM | POA: Diagnosis not present

## 2013-07-09 DIAGNOSIS — M949 Disorder of cartilage, unspecified: Secondary | ICD-10-CM | POA: Diagnosis not present

## 2013-08-27 NOTE — Telephone Encounter (Signed)
Encounter complete. 

## 2013-09-12 DIAGNOSIS — Z1231 Encounter for screening mammogram for malignant neoplasm of breast: Secondary | ICD-10-CM | POA: Diagnosis not present

## 2013-09-19 DIAGNOSIS — Z961 Presence of intraocular lens: Secondary | ICD-10-CM | POA: Diagnosis not present

## 2013-09-19 DIAGNOSIS — H52209 Unspecified astigmatism, unspecified eye: Secondary | ICD-10-CM | POA: Diagnosis not present

## 2013-10-22 ENCOUNTER — Encounter: Payer: Self-pay | Admitting: Interventional Cardiology

## 2013-10-22 ENCOUNTER — Ambulatory Visit (INDEPENDENT_AMBULATORY_CARE_PROVIDER_SITE_OTHER): Payer: Medicare Other | Admitting: Interventional Cardiology

## 2013-10-22 ENCOUNTER — Other Ambulatory Visit: Payer: Medicare Other

## 2013-10-22 VITALS — BP 128/70 | HR 57 | Ht 66.5 in | Wt 185.0 lb

## 2013-10-22 DIAGNOSIS — I480 Paroxysmal atrial fibrillation: Secondary | ICD-10-CM

## 2013-10-22 DIAGNOSIS — I10 Essential (primary) hypertension: Secondary | ICD-10-CM

## 2013-10-22 DIAGNOSIS — Z79899 Other long term (current) drug therapy: Secondary | ICD-10-CM

## 2013-10-22 DIAGNOSIS — I4891 Unspecified atrial fibrillation: Secondary | ICD-10-CM

## 2013-10-22 LAB — HEPATIC FUNCTION PANEL
ALBUMIN: 3.9 g/dL (ref 3.5–5.2)
ALT: 17 U/L (ref 0–35)
AST: 18 U/L (ref 0–37)
Alkaline Phosphatase: 67 U/L (ref 39–117)
BILIRUBIN DIRECT: 0 mg/dL (ref 0.0–0.3)
Total Bilirubin: 0.5 mg/dL (ref 0.2–1.2)
Total Protein: 7 g/dL (ref 6.0–8.3)

## 2013-10-22 LAB — TSH: TSH: 1.51 u[IU]/mL (ref 0.35–4.50)

## 2013-10-22 NOTE — Progress Notes (Signed)
Patient ID: Judy White, female   DOB: [birth year], 73 y.o.   MRN: [id number]    [address] [place], [place]  [address] Phone: [phone] Fax:  [phone]  Date:  10/22/2013   ID:  ANAB VIVAR, DOB [birth year], MRN [id number]  PCP:  Horton Finer, MD   ASSESSMENT:  1. Paroxysmal atrial fibrillation 2. Amiodarone therapy 3. Recurring chest pain with recent normal Cardiolite 4. Hypertension  PLAN:  1. TSH, hepatic panel, and pale hour chest x-ray today and in 6 months   SUBJECTIVE: Judy White is a 73 y.o. female who continues to have chest discomfort. The recent myocardial perfusion study is normal. She denies syncope. No prolonged palpitations. She has not had medication side effects that she is aware of.   Wt Readings from Last 3 Encounters:  10/22/13 185 lb (83.915 kg)  05/30/13 184 lb (83.462 kg)  05/21/13 184 lb (83.462 kg)     Past Medical History  Diagnosis Date  . Hypercholesteremia   . Osteopenia   . Chest pain   . Onychia   . HTN (hypertension)   . Depressive disorder, not elsewhere classified   . Anxiety   . Atrial fibrillation   . Unspecified arthropathy, multiple sites   . Myalgia and myositis, unspecified   . COPD (chronic obstructive pulmonary disease)   . Overactive bladder     Current Outpatient Prescriptions  Medication Sig Dispense Refill  . amiodarone (PACERONE) 200 MG tablet Take 1 tablet (200 mg total) by mouth daily.  90 tablet  3  . Ascorbic Acid (VITAMIN C PO) Take by mouth daily.      Marland Kitchen aspirin 81 MG tablet Take 81 mg by mouth daily.      . Calcium-Vitamin D (CALTRATE 600 PLUS-VIT D PO) Take by mouth daily.      . Cholecalciferol (VITAMIN D PO) Take by mouth daily.      . clonazePAM (KLONOPIN) 1 MG tablet Take 1 tablet by mouth 2 (two) times daily.      . Cyanocobalamin (VITAMIN B-12 PO) Take by mouth daily.      Marland Kitchen FLUoxetine (PROZAC) 20 MG capsule Take 1 capsule by mouth daily.      . Omega-3 Fatty Acids (FISH  OIL PO) Take by mouth daily.      Marland Kitchen oxybutynin (DITROPAN-XL) 10 MG 24 hr tablet Take 1 tablet by mouth daily.       No current facility-administered medications for this visit.    Allergies:   No Known Allergies  Social History:  The patient  reports that she has been smoking.  She does not have any smokeless tobacco history on file. She reports that she drinks alcohol. She reports that she does not use illicit drugs.   ROS:  Please see the history of present illness.   Stable appetite. No transient neurological symptoms.   All other systems reviewed and negative.   OBJECTIVE: VS:  BP 128/70  Pulse 57  Ht 5' 6.5" (1.689 m)  Wt 185 lb (83.915 kg)  BMI 29.42 kg/m2 Well nourished, well developed, in no acute distress, younger than stated age 77: normal Neck: JVD flat. Carotid bruit absent  Cardiac:  normal S1, S2; RRR; no murmur Lungs:  clear to auscultation bilaterally, no wheezing, rhonchi or rales Abd: soft, nontender, no hepatomegaly Ext: Edema absent. Pulses 2+ bilateral Skin: warm and dry Neuro:  CNs 2-12 intact, no focal abnormalities noted  EKG:  Not repeated  Signed, Illene Labrador III, MD 10/22/2013 1:20 PM

## 2013-10-22 NOTE — Patient Instructions (Signed)
Your physician recommends that you continue on your current medications as directed. Please refer to the Current Medication list given to you today.  Your physician recommends that you return for lab work in: 6 months (Tsh,Hepatic)  A chest x-ray takes a picture of the organs and structures inside the chest, including the heart, lungs, and blood vessels. This test can show several things, including, whether the heart is enlarges; whether fluid is building up in the lungs; and whether pacemaker / defibrillator leads are still in place.(To be done in 6 months)

## 2013-10-30 DIAGNOSIS — IMO0002 Reserved for concepts with insufficient information to code with codable children: Secondary | ICD-10-CM | POA: Diagnosis not present

## 2013-11-27 DIAGNOSIS — Z23 Encounter for immunization: Secondary | ICD-10-CM | POA: Diagnosis not present

## 2013-12-19 ENCOUNTER — Encounter: Payer: Self-pay | Admitting: *Deleted

## 2013-12-19 ENCOUNTER — Emergency Department (HOSPITAL_COMMUNITY): Payer: Medicare Other

## 2013-12-19 ENCOUNTER — Encounter (HOSPITAL_COMMUNITY): Payer: Self-pay | Admitting: Emergency Medicine

## 2013-12-19 ENCOUNTER — Encounter (INDEPENDENT_AMBULATORY_CARE_PROVIDER_SITE_OTHER): Payer: Medicare Other

## 2013-12-19 ENCOUNTER — Emergency Department (HOSPITAL_COMMUNITY)
Admission: EM | Admit: 2013-12-19 | Discharge: 2013-12-19 | Disposition: A | Discharge status: Home / Self Care | Payer: Medicare Other | Attending: Emergency Medicine | Admitting: Emergency Medicine

## 2013-12-19 ENCOUNTER — Telehealth: Payer: Self-pay | Admitting: Interventional Cardiology

## 2013-12-19 DIAGNOSIS — Z7982 Long term (current) use of aspirin: Secondary | ICD-10-CM | POA: Diagnosis not present

## 2013-12-19 DIAGNOSIS — I4892 Unspecified atrial flutter: Secondary | ICD-10-CM | POA: Insufficient documentation

## 2013-12-19 DIAGNOSIS — R51 Headache: Secondary | ICD-10-CM | POA: Insufficient documentation

## 2013-12-19 DIAGNOSIS — F329 Major depressive disorder, single episode, unspecified: Secondary | ICD-10-CM | POA: Insufficient documentation

## 2013-12-19 DIAGNOSIS — R519 Headache, unspecified: Secondary | ICD-10-CM

## 2013-12-19 DIAGNOSIS — F419 Anxiety disorder, unspecified: Secondary | ICD-10-CM | POA: Insufficient documentation

## 2013-12-19 DIAGNOSIS — R079 Chest pain, unspecified: Secondary | ICD-10-CM | POA: Insufficient documentation

## 2013-12-19 DIAGNOSIS — Z79899 Other long term (current) drug therapy: Secondary | ICD-10-CM | POA: Insufficient documentation

## 2013-12-19 DIAGNOSIS — I480 Paroxysmal atrial fibrillation: Secondary | ICD-10-CM

## 2013-12-19 DIAGNOSIS — Z8639 Personal history of other endocrine, nutritional and metabolic disease: Secondary | ICD-10-CM | POA: Insufficient documentation

## 2013-12-19 DIAGNOSIS — R0789 Other chest pain: Secondary | ICD-10-CM | POA: Diagnosis not present

## 2013-12-19 DIAGNOSIS — J449 Chronic obstructive pulmonary disease, unspecified: Secondary | ICD-10-CM | POA: Insufficient documentation

## 2013-12-19 DIAGNOSIS — I4891 Unspecified atrial fibrillation: Secondary | ICD-10-CM | POA: Diagnosis not present

## 2013-12-19 DIAGNOSIS — I10 Essential (primary) hypertension: Secondary | ICD-10-CM | POA: Diagnosis not present

## 2013-12-19 DIAGNOSIS — I484 Atypical atrial flutter: Secondary | ICD-10-CM | POA: Diagnosis not present

## 2013-12-19 DIAGNOSIS — J984 Other disorders of lung: Secondary | ICD-10-CM | POA: Diagnosis not present

## 2013-12-19 DIAGNOSIS — Z720 Tobacco use: Secondary | ICD-10-CM | POA: Diagnosis not present

## 2013-12-19 LAB — I-STAT TROPONIN, ED
Troponin i, poc: 0 ng/mL (ref 0.00–0.08)
Troponin i, poc: 0 ng/mL (ref 0.00–0.08)
Troponin i, poc: 0 ng/mL (ref 0.00–0.08)

## 2013-12-19 LAB — BASIC METABOLIC PANEL
ANION GAP: 14 (ref 5–15)
BUN: 21 mg/dL (ref 6–23)
CALCIUM: 9 mg/dL (ref 8.4–10.5)
CHLORIDE: 104 meq/L (ref 96–112)
CO2: 24 mEq/L (ref 19–32)
Creatinine, Ser: 0.96 mg/dL (ref 0.50–1.10)
GFR calc Af Amer: 65 mL/min — ABNORMAL LOW (ref >90)
GFR calc non Af Amer: 56 mL/min — ABNORMAL LOW (ref >90)
Glucose, Bld: 115 mg/dL — ABNORMAL HIGH (ref 70–99)
Potassium: 4.2 mEq/L (ref 3.7–5.3)
SODIUM: 142 meq/L (ref 137–147)

## 2013-12-19 LAB — CBC
HCT: 46.7 % — ABNORMAL HIGH (ref 36.0–46.0)
Hemoglobin: 15.6 g/dL — ABNORMAL HIGH (ref 12.0–15.0)
MCH: 30.1 pg (ref 26.0–34.0)
MCHC: 33.4 g/dL (ref 30.0–36.0)
MCV: 90 fL (ref 78.0–100.0)
Platelets: 197 10*3/uL (ref 150–400)
RBC: 5.19 MIL/uL — AB (ref 3.87–5.11)
RDW: 13.7 % (ref 11.5–15.5)
WBC: 9.3 10*3/uL (ref 4.0–10.5)

## 2013-12-19 MED ORDER — METOCLOPRAMIDE HCL 10 MG PO TABS: 10.0000 mg | ORAL_TABLET | Freq: Four times a day (QID) | ORAL | Status: DC | PRN

## 2013-12-19 MED ORDER — HYDROCODONE-ACETAMINOPHEN 5-325 MG PO TABS: 2.0000 | ORAL_TABLET | Freq: Every evening | ORAL | Status: DC | PRN

## 2013-12-19 MED ORDER — ASPIRIN 325 MG PO TABS
325.0000 mg | ORAL_TABLET | Freq: Once | ORAL | Status: AC
  Administered 2013-12-19: 325 mg via ORAL
  Filled 2013-12-19: qty 1

## 2013-12-19 NOTE — ED Provider Notes (Signed)
CSN: [id number]     Arrival date & time 12/19/13  1031 History   First MD Initiated Contact with Patient 12/19/13 1119     Chief Complaint  Patient presents with  . Atrial Fibrillation  . Chest Pain     (Consider location/radiation/quality/duration/timing/severity/associated sxs/prior Treatment) HPI 73 year old female with history of paroxysmal atrial fibrillation as well as chronic chest pain syndrome and several months ago she had a normal stress Myoview at that time she was having chest pain episodes about once a week lasting for about 30 minutes or so with onset either at rest or during activity, now over the last several weeks she is experiencing chest pain episodes a few times a week lasting several hours at a time felt as a pressure with shortness of breath sometimes with heart rate over 100 during her episodes, she is currently pain-free but this morning woke up with pulse rate over 100 with chest pressure and shortness of breath and took amiodarone and aspirin prior to arrival, she is no syncope no fever no change in speech or vision no change in swallowing or understanding no lateralizing or focal weakness or numbness but does have constant gradual onset mild pressure frontal headache over the last few days. Patient not on anticoagulation due to history of prior intracranial hemorrhage on Coumadin. Past Medical History  Diagnosis Date  . Hypercholesteremia   . Osteopenia   . Chest pain   . Onychia   . HTN (hypertension)   . Depressive disorder, not elsewhere classified   . Anxiety   . Atrial fibrillation   . Unspecified arthropathy, multiple sites   . Myalgia and myositis, unspecified   . COPD (chronic obstructive pulmonary disease)   . Overactive bladder    Past Surgical History  Procedure Laterality Date  . Tonsillectomy    . Appendectomy    . Cesarean section    . Wisdom tooth extraction    . Shoulder surgery    . Re-excision of breast lumpectomy     Family History   Problem Relation Age of Onset  . Cancer      maternal side  . Heart Problems      paternal side  . Aneurysm Father   . Hypotension Mother    History  Substance Use Topics  . Smoking status: Current Every Day Smoker  . Smokeless tobacco: Not on file  . Alcohol Use: Yes   OB History    No data available     Review of Systems 10 Systems reviewed and are negative for acute change except as noted in the HPI.   Allergies  Review of patient's allergies indicates no known allergies.  Home Medications   Prior to Admission medications   Medication Sig Start Date End Date Taking? Authorizing Provider  amiodarone (PACERONE) 200 MG tablet Take 1 tablet (200 mg total) by mouth daily. 04/03/13  Yes Belva Crome III, MD  Ascorbic Acid (VITAMIN C PO) Take 1 tablet by mouth daily.    Yes Historical Provider, MD  aspirin 81 MG tablet Take 81 mg by mouth daily.   Yes Historical Provider, MD  Calcium-Vitamin D (CALTRATE 600 PLUS-VIT D PO) Take 1 tablet by mouth daily.    Yes Historical Provider, MD  Cholecalciferol (VITAMIN D PO) Take 1 tablet by mouth daily.    Yes Historical Provider, MD  clonazePAM (KLONOPIN) 1 MG tablet Take 1 tablet by mouth 2 (two) times daily. 03/30/13  Yes Historical Provider, MD  Cyanocobalamin (VITAMIN B-12  PO) Take 1 tablet by mouth daily.    Yes Historical Provider, MD  FLUoxetine (PROZAC) 20 MG capsule Take 1 capsule by mouth daily. 03/16/13  Yes Historical Provider, MD  Omega-3 Fatty Acids (FISH OIL PO) Take 1 tablet by mouth daily.    Yes Historical Provider, MD  oxybutynin (DITROPAN-XL) 10 MG 24 hr tablet Take 1 tablet by mouth daily. 03/22/13  Yes Historical Provider, MD  HYDROcodone-acetaminophen (NORCO) 5-325 MG per tablet Take 2 tablets by mouth at bedtime as needed for moderate pain or severe pain. 12/19/13   Babette Relic, MD  metoCLOPramide (REGLAN) 10 MG tablet Take 1 tablet (10 mg total) by mouth every 6 (six) hours as needed for nausea (nausea/headache).  12/19/13   Babette Relic, MD   BP 110/85 mmHg  Pulse 82  Temp(Src) 98.3 F (36.8 C) (Oral)  Resp 16  Ht 5\' 7"  (1.702 m)  Wt 175 lb (79.379 kg)  BMI 27.40 kg/m2  SpO2 95% Physical Exam  Nursing note and vitals reviewed. Constitutional:  Awake, alert, nontoxic appearance with baseline speech for patient.  HENT:  Head: Atraumatic.  Mouth/Throat: No oropharyngeal exudate.  Eyes: EOM are normal. Pupils are equal, round, and reactive to light. Right eye exhibits no discharge. Left eye exhibits no discharge.  Neck: Neck supple.  Cardiovascular: Normal rate and regular rhythm.   No murmur heard. Pulmonary/Chest: Effort normal and breath sounds normal. No stridor. No respiratory distress. She has no wheezes. She has no rales. She exhibits no tenderness.  Abdominal: Soft. Bowel sounds are normal. She exhibits no mass. There is no tenderness. There is no rebound.  Musculoskeletal: She exhibits no tenderness.  Baseline ROM, moves extremities with no obvious new focal weakness.  Lymphadenopathy:    She has no cervical adenopathy.  Neurological: She is alert.  Awake, alert, cooperative and aware of situation; motor strength bilaterally; sensation normal to light touch bilaterally; peripheral visual fields full to confrontation; no facial asymmetry; tongue midline; major cranial nerves appear intact; no pronator drift, normal finger to nose bilaterally  Skin: No rash noted.  Psychiatric: She has a normal mood and affect.    ED Course  Procedures (including critical care time) HEART score 4. D/w cards rec r/o AMI in ED and discharge. 1200 Patient alternating between sinus rhythm, as well as brief runs lasting a few minutes at a time of atrial fibrillation and atrial flutter spontaneously converted back to sinus rhythm. Cards recs home event monitor.1425 Patient / Family / Caregiver informed of clinical course, understand medical decision-making process, and agree with plan.Pt stable in ED with  no significant deterioration in condition. Labs Review Labs Reviewed  CBC - Abnormal; Notable for the following:    RBC 5.19 (*)    Hemoglobin 15.6 (*)    HCT 46.7 (*)    All other components within normal limits  BASIC METABOLIC PANEL - Abnormal; Notable for the following:    Glucose, Bld 115 (*)    GFR calc non Af Amer 56 (*)    GFR calc Af Amer 65 (*)    All other components within normal limits  I-STAT TROPOININ, ED  I-STAT TROPOININ, ED  I-STAT TROPOININ, ED    Imaging Review No results found.   EKG Interpretation   Date/Time:  Friday December 19 2013 14:06:28 EDT Ventricular Rate:  104 PR Interval:  180 QRS Duration: 128 QT Interval:  431 QTC Calculation: 567 R Axis:   63 Text Interpretation:  Atrial flutter Paired ventricular  premature  complexes Right bundle branch block Nonspecific T abnormalities, lateral  leads Compared to previous tracing Atrial flutter Right bundle branch  block NOW PRESENT Confirmed by [facility]  MD, Jenny Reichmann ([id number]) on 12/19/2013  2:10:35 PM      MDM   Final diagnoses:  Chest pain, unspecified chest pain type  Atrial fibrillation and flutter  Acute nonintractable headache, unspecified headache type    I doubt any other EMC precluding discharge at this time including, but not necessarily limited to the following:ACS.    Babette Relic, MD 12/28/13 2006

## 2013-12-19 NOTE — Progress Notes (Signed)
Patient ID: Judy White, female   DOB: 1940/09/04, 73 y.o.   MRN: [id number] [employer] verite 30 day cardiac event monitor applied to patient while in ER awaiting discharge.

## 2013-12-19 NOTE — Discharge Instructions (Signed)
Atrial Fibrillation Atrial fibrillation is a condition that causes your heart to beat irregularly. It may also cause your heart to beat faster than normal. Atrial fibrillation can prevent your heart from pumping blood normally. It increases your risk of stroke and heart problems. HOME CARE  Take medications as told by your doctor.  Only take medications that your doctor says are safe. Some medications can make the condition worse or happen again.  If blood thinners were prescribed by your doctor, take them exactly as told. Too much can cause bleeding. Too little and you will not have the needed protection against stroke and other problems.  Perform blood tests at home if told by your doctor.  Perform blood tests exactly as told by your doctor.  Do not drink alcohol.  Do not drink beverages with caffeine such as coffee, soda, and some teas.  Maintain a healthy weight.  Do not use diet pills unless your doctor says they are safe. They may make heart problems worse.  Follow diet instructions as told by your doctor.  Exercise regularly as told by your doctor.  Keep all follow-up appointments. GET HELP IF:  You notice a change in the speed, rhythm, or strength of your heartbeat.  You suddenly begin peeing (urinating) more often.  You get tired more easily when moving or exercising. GET HELP RIGHT AWAY IF:   You have chest or belly (abdominal) pain.  You feel sick to your stomach (nauseous).  You are short of breath.  You suddenly have swollen feet and ankles.  You feel dizzy.  You face, arms, or legs feel numb or weak.  There is a change in your vision or speech. MAKE SURE YOU:   Understand these instructions.  Will watch your condition.  Will get help right away if you are not doing well or get worse. Document Released: 11/23/2007 Document Revised: 06/30/2013 Document Reviewed: 03/26/2012 [facility] Patient Information 2015 [place], [place]. This information is not  intended to replace advice given to you by your health care provider. Make sure you discuss any questions you have with your health care provider.  Your caregiver has diagnosed you as having chest pain that is not specific for one problem, but does not require admission.  You are at low risk for an acute heart condition or other serious illness. Chest pain comes from many different causes.  SEEK IMMEDIATE MEDICAL ATTENTION IF: You have severe chest pain, especially if the pain is crushing or pressure-like and spreads to the arms, back, neck, or jaw, or if you have sweating, nausea (feeling sick to your stomach), or shortness of breath. THIS IS AN EMERGENCY. Don't wait to see if the pain will go away. Get medical help at once. Call 911 or 0 (operator). DO NOT drive yourself to the hospital.  Your chest pain gets worse and does not go away with rest.  You have an attack of chest pain lasting longer than usual, despite rest and treatment with the medications your caregiver has prescribed.  You wake from sleep with chest pain or shortness of breath.  You feel dizzy or faint.  You have chest pain not typical of your usual pain for which you originally saw your caregiver.  You are having a headache. No specific cause was found today for your headache. It may have been a migraine or other cause of headache. Stress, anxiety, fatigue, and depression are common triggers for headaches. Your headache today does not appear to be life-threatening or require hospitalization,  but often the exact cause of headaches is not determined in the emergency department. Therefore, follow-up with your doctor is very important to find out what may have caused your headache, and whether or not you need any further diagnostic testing or treatment. Sometimes headaches can appear benign (not harmful), but then more serious symptoms can develop which should prompt an immediate re-evaluation by your doctor or the emergency  department. SEEK MEDICAL ATTENTION IF: You develop possible problems with medications prescribed.  The medications don't resolve your headache, if it recurs , or if you have multiple episodes of vomiting or can't take fluids. You have a change from the usual headache. RETURN IMMEDIATELY IF you develop a sudden, severe headache or confusion, become poorly responsive or faint, develop a fever above 100.30F or problem breathing, have a change in speech, vision, swallowing, or understanding, or develop new weakness, numbness, tingling, incoordination, or have a seizure.

## 2013-12-19 NOTE — ED Notes (Signed)
Patient given coffee and heart healthy meal tray ordered per Dr. Stevie Kern.

## 2013-12-19 NOTE — Telephone Encounter (Signed)
New message      Pt has AFIB---last night her pulse was 101 and bp 160/92.  This am, pulse is 53, 123/63 bp.  Pt is sob.  Please advise

## 2013-12-19 NOTE — ED Notes (Signed)
Pt states she has hx of Afib and takes Amiodarone and a baby aspirin.  Pt states that her rate is usually controlled, but for the last 2 days it has been out of control.  Pt also c/o SOB.

## 2013-12-19 NOTE — Telephone Encounter (Signed)
Per Dr. Tamala Julian, patient needs to go on to [facility] ED. She is agreeable to this plan.

## 2013-12-19 NOTE — Telephone Encounter (Signed)
Patient states BP 93/71 P98 right now. She is having chest pain, lightheadedness, and some sob.has taken her Amiodarone this a.m. Denies N/V sweating  Or radiation of pain. Will speak with Linard Millers MD, DOD

## 2014-01-05 ENCOUNTER — Encounter: Payer: Self-pay | Admitting: Interventional Cardiology

## 2014-01-05 ENCOUNTER — Ambulatory Visit (INDEPENDENT_AMBULATORY_CARE_PROVIDER_SITE_OTHER): Payer: Medicare Other | Admitting: Interventional Cardiology

## 2014-01-05 VITALS — BP 132/60 | HR 46 | Ht 67.0 in | Wt 184.6 lb

## 2014-01-05 DIAGNOSIS — I484 Atypical atrial flutter: Secondary | ICD-10-CM

## 2014-01-05 DIAGNOSIS — I10 Essential (primary) hypertension: Secondary | ICD-10-CM | POA: Diagnosis not present

## 2014-01-05 DIAGNOSIS — Z79899 Other long term (current) drug therapy: Secondary | ICD-10-CM | POA: Diagnosis not present

## 2014-01-05 DIAGNOSIS — J441 Chronic obstructive pulmonary disease with (acute) exacerbation: Secondary | ICD-10-CM

## 2014-01-05 DIAGNOSIS — I480 Paroxysmal atrial fibrillation: Secondary | ICD-10-CM | POA: Diagnosis not present

## 2014-01-05 DIAGNOSIS — I4892 Unspecified atrial flutter: Secondary | ICD-10-CM

## 2014-01-05 MED ORDER — DILTIAZEM HCL ER COATED BEADS 120 MG PO CP24: 120.0000 mg | ORAL_CAPSULE | Freq: Every day | ORAL | Status: DC

## 2014-01-05 MED ORDER — AMIODARONE HCL 200 MG PO TABS: 200.0000 mg | ORAL_TABLET | Freq: Every day | ORAL | Status: DC

## 2014-01-05 NOTE — Progress Notes (Signed)
Patient ID: Judy White, female   DOB: 1940-06-05, 73 y.o.   MRN: [id number]    [address] [place], [place]  [address] Phone: [phone] Fax:  [phone]  Date:  01/05/2014   ID:  Judy White, DOB May 28, 1940, MRN [id number]  PCP:  Horton Finer, MD   ASSESSMENT:  1. Paroxysmal atrial fibrillation/flutter despite amiodarone therapy with recent emergency room visit 2. Hypertension 3. Unable to use anticoagulation therapy related to intracranial bleed 4. Amiodarone therapy   PLAN:  1. Continued 30 day monitor 2. 30 day follow-up after monitor completed 3. Start diltiazem CD 120 mg per day 4. May require a higher dose of amiodarone or consideration of ablation   SUBJECTIVE: Judy White is a 73 y.o. female who continues to have palpitations. Was seen in the emergency room with persistent tachycardia and was noted to have atrial flutter by EKG. Prior history of atrial fibrillation for which he takes amiodarone. No transient neurological complaints. Was previously on long-term Coumadin therapy but had intracranial bleeding/subdural medication was discontinued many years ago. She denies headache. No heart sprays up she gets frightened. She has not had syncope.   Wt Readings from Last 3 Encounters:  01/05/14 184 lb 9.6 oz (83.734 kg)  12/19/13 175 lb (79.379 kg)  10/22/13 185 lb (83.915 kg)     Past Medical History  Diagnosis Date  . Hypercholesteremia   . Osteopenia   . Chest pain   . Onychia   . HTN (hypertension)   . Depressive disorder, not elsewhere classified   . Anxiety   . Atrial fibrillation   . Unspecified arthropathy, multiple sites   . Myalgia and myositis, unspecified   . COPD (chronic obstructive pulmonary disease)   . Overactive bladder     Current Outpatient Prescriptions  Medication Sig Dispense Refill  . amiodarone (PACERONE) 200 MG tablet Take 1 tablet (200 mg total) by mouth daily. 90 tablet 3  . Ascorbic Acid (VITAMIN C PO)  Take 1 tablet by mouth daily.     Marland Kitchen aspirin 81 MG tablet Take 81 mg by mouth daily.    . Calcium-Vitamin D (CALTRATE 600 PLUS-VIT D PO) Take 1 tablet by mouth daily.     . Cholecalciferol (VITAMIN D PO) Take 1 tablet by mouth daily.     . clonazePAM (KLONOPIN) 1 MG tablet Take 1 tablet by mouth 2 (two) times daily.    . Cyanocobalamin (VITAMIN B-12 PO) Take 1 tablet by mouth daily.     Marland Kitchen FLUoxetine (PROZAC) 20 MG capsule Take 1 capsule by mouth daily.    Marland Kitchen HYDROcodone-acetaminophen (NORCO) 5-325 MG per tablet Take 2 tablets by mouth at bedtime as needed for moderate pain or severe pain. 6 tablet 0  . Omega-3 Fatty Acids (FISH OIL PO) Take 1 tablet by mouth daily.     Marland Kitchen oxybutynin (DITROPAN-XL) 10 MG 24 hr tablet Take 1 tablet by mouth daily.     No current facility-administered medications for this visit.    Allergies:   No Known Allergies  Social History:  The patient  reports that she has been smoking.  She does not have any smokeless tobacco history on file. She reports that she drinks alcohol. She reports that she does not use illicit drugs.   ROS:  Please see the history of present illness.   Under stress at home because of her husband's deteriorating memory. She denies orthopnea, PND, chest pain.   All other systems  reviewed and negative.   OBJECTIVE: VS:  BP 132/60 mmHg  Pulse 46  Ht 5\' 7"  (1.702 m)  Wt 184 lb 9.6 oz (83.734 kg)  BMI 28.91 kg/m2 Well nourished, well developed, in no acute distress HEENT: normal Neck: JVD flat. Carotid bruit absent  Cardiac:  normal S1, S2; IIRR; no murmur. Rhythm is brief and intermittently irregular Lungs:  clear to auscultation bilaterally, no wheezing, rhonchi or rales; Skin: warm and dry Extremities: No edema Neuro:  CNs 2-12 intact, no focal abnormalities noted  EKG:  Not repeated all EKGs from emergency room visit of 10/23-24/2015 were reviewed.   Atrial flutter was confirmed    Signed, Illene Labrador III, MD 01/05/2014 4:53 PM

## 2014-01-05 NOTE — Patient Instructions (Signed)
Your physician has recommended you make the following change in your medication:  1) START Diltiazem ER 120mg  daily Take all other medications as prescribed  You have a follow up appointment scheduled for 01/28/14 @ 1:15pm

## 2014-01-06 ENCOUNTER — Encounter: Payer: Self-pay | Admitting: Interventional Cardiology

## 2014-01-28 ENCOUNTER — Ambulatory Visit: Payer: Medicare Other | Admitting: Interventional Cardiology

## 2014-02-02 ENCOUNTER — Telehealth: Payer: Self-pay | Admitting: Interventional Cardiology

## 2014-02-02 NOTE — Telephone Encounter (Signed)
New message         Pt calling for monitor results

## 2014-02-03 NOTE — Telephone Encounter (Signed)
Follow up        Pt calling for monitor results again / pt states she is still having problems

## 2014-02-03 NOTE — Telephone Encounter (Signed)
Returned pt call. Adv her that her cardiac monitor report has been received.Dr.Smith will be in the office this afternoon, I will  Give her a call back once he has reviewed the report.pt verbalized understanding.

## 2014-02-05 NOTE — Telephone Encounter (Signed)
Follow up     Pt want her monitor results---she states she is still having problems

## 2014-02-05 NOTE — Telephone Encounter (Signed)
Pt aware of Dr.Smith recommendation Pt needs ref to Dr.Allred to consider AF Ablation Or other management strategy. Adv pt that I scheduler from our office Will call pt to schedule consult with Dr.Allred Pt agreeable and verbalized understanding.

## 2014-02-05 NOTE — Telephone Encounter (Signed)
Returned pt call. Pt aware of cardiac event monitor results -Frequent Afib/Aflutter Adv pt that monitor was reviewed by Dr.Smith, I will talk with him about his recommendations and call back with his response.

## 2014-02-13 DIAGNOSIS — D2372 Other benign neoplasm of skin of left lower limb, including hip: Secondary | ICD-10-CM | POA: Diagnosis not present

## 2014-02-13 DIAGNOSIS — D1801 Hemangioma of skin and subcutaneous tissue: Secondary | ICD-10-CM | POA: Diagnosis not present

## 2014-02-13 DIAGNOSIS — L820 Inflamed seborrheic keratosis: Secondary | ICD-10-CM | POA: Diagnosis not present

## 2014-02-13 DIAGNOSIS — D225 Melanocytic nevi of trunk: Secondary | ICD-10-CM | POA: Diagnosis not present

## 2014-02-13 DIAGNOSIS — L570 Actinic keratosis: Secondary | ICD-10-CM | POA: Diagnosis not present

## 2014-02-13 DIAGNOSIS — L821 Other seborrheic keratosis: Secondary | ICD-10-CM | POA: Diagnosis not present

## 2014-02-14 ENCOUNTER — Encounter: Payer: Self-pay | Admitting: *Deleted

## 2014-03-03 DIAGNOSIS — F3341 Major depressive disorder, recurrent, in partial remission: Secondary | ICD-10-CM | POA: Diagnosis not present

## 2014-03-04 ENCOUNTER — Encounter: Payer: Self-pay | Admitting: Internal Medicine

## 2014-03-04 ENCOUNTER — Ambulatory Visit (INDEPENDENT_AMBULATORY_CARE_PROVIDER_SITE_OTHER): Payer: Medicare Other | Admitting: Internal Medicine

## 2014-03-04 VITALS — BP 118/74 | HR 49 | Ht 66.5 in | Wt 184.0 lb

## 2014-03-04 DIAGNOSIS — Z79899 Other long term (current) drug therapy: Secondary | ICD-10-CM

## 2014-03-04 DIAGNOSIS — I480 Paroxysmal atrial fibrillation: Secondary | ICD-10-CM | POA: Diagnosis not present

## 2014-03-04 DIAGNOSIS — I10 Essential (primary) hypertension: Secondary | ICD-10-CM

## 2014-03-04 NOTE — Patient Instructions (Signed)
Your physician has requested that you have an echocardiogram. Echocardiography is a painless test that uses sound waves to create images of your heart. It provides your doctor with information about the size and shape of your heart and how well your heart's chambers and valves are working. This procedure takes approximately one hour. There are no restrictions for this procedure.   Your physician recommends that you schedule a follow-up appointment in: 6 weeks with Dr Rayann Heman  Your physician recommends that you return for lab work today: Amiodarone level

## 2014-03-05 ENCOUNTER — Encounter: Payer: Self-pay | Admitting: Internal Medicine

## 2014-03-05 NOTE — Progress Notes (Signed)
Primary Care Physician: Horton Finer, MD Referring Physician:  Dr Maia Breslow Judy White is a 74 y.o. female with a h/o paroxysmal atrial fibrillation who presents for EP consultation.  She has had afib for several years and has been managed with amiodarone.  She previously had a spontaneous ICH and has therefore not been on anticoagulation for several years.  Unfortunately, she has developed increasing frequency and duration of atrial fibrillation.  She does not tolerate her AF very well.  She reports symptoms of palpitations, SOB, and fatigue.  She is unaware of triggers or precipitants for her AF.  She recently wore an event monitor which revealed AF burden of 5%.    Today, she denies symptoms of  orthopnea, PND,   dizziness, presyncope, syncope, or neurologic sequela. She has mild ankle edema.  She has occasional CP which is chronic. The patient is tolerating medications without difficulties and is otherwise without complaint today.   Past Medical History  Diagnosis Date  . Hypercholesteremia   . Osteopenia   . HTN (hypertension)   . Depressive disorder, not elsewhere classified   . Anxiety   . Paroxysmal atrial fibrillation   . Myalgia and myositis, unspecified   . COPD (chronic obstructive pulmonary disease)   . Overactive bladder    Past Surgical History  Procedure Laterality Date  . Tonsillectomy    . Appendectomy    . Cesarean section    . Wisdom tooth extraction    . Shoulder surgery    . Re-excision of breast lumpectomy      Current Outpatient Prescriptions  Medication Sig Dispense Refill  . amiodarone (PACERONE) 200 MG tablet Take 1 tablet (200 mg total) by mouth daily. 90 tablet 0  . Ascorbic Acid (VITAMIN C PO) Take 1 tablet by mouth daily.     Marland Kitchen aspirin 81 MG tablet Take 81 mg by mouth daily.    . Calcium-Vitamin D (CALTRATE 600 PLUS-VIT D PO) Take 1 tablet by mouth daily.     . Cholecalciferol (VITAMIN D PO) Take 1 tablet by mouth daily.     . clonazePAM  (KLONOPIN) 1 MG tablet Take 1 tablet by mouth 2 (two) times daily.    . Cyanocobalamin (VITAMIN B-12 PO) Take 1 tablet by mouth daily.     Marland Kitchen diltiazem (CARDIZEM CD) 120 MG 24 hr capsule Take 1 capsule (120 mg total) by mouth daily. 30 capsule 11  . FLUoxetine (PROZAC) 20 MG capsule Take 1 capsule by mouth daily.    Marland Kitchen HYDROcodone-acetaminophen (NORCO) 5-325 MG per tablet Take 2 tablets by mouth at bedtime as needed for moderate pain or severe pain. 6 tablet 0  . Omega-3 Fatty Acids (FISH OIL PO) Take 1 tablet by mouth daily.     Marland Kitchen oxybutynin (DITROPAN-XL) 10 MG 24 hr tablet Take 1 tablet by mouth daily.     No current facility-administered medications for this visit.    No Known Allergies  History   Social History  . Marital Status: Married    Spouse Name: N/A    Number of Children: N/A  . Years of Education: N/A   Occupational History  . Not on file.   Social History Main Topics  . Smoking status: Current Every Day Smoker  . Smokeless tobacco: Not on file  . Alcohol Use: 0.0 oz/week    0 Not specified per week  . Drug Use: No  . Sexual Activity: Not on file   Other Topics Concern  . Not  on file   Social History Narrative    Family History  Problem Relation Age of Onset  . Cancer      maternal side  . Heart Problems      paternal side  . Aneurysm Father   . Hypotension Mother   . Neuropathy Brother   . Heart disease Brother     ROS- + fatigue, Nausea, diarrhea, headaches, All other systems are reviewed and negative except as per the HPI above  Physical Exam: Filed Vitals:   03/04/14 1216  BP: 118/74  Pulse: 49  Height: 5' 6.5" (1.689 m)  Weight: 184 lb (83.462 kg)    GEN- The patient is well appearing, alert and oriented x 3 today.   Head- normocephalic, atraumatic Eyes-  Sclera clear, conjunctiva pink Ears- hearing intact Oropharynx- clear Neck- supple, no JVP Lymph- no cervical lymphadenopathy Lungs- Clear to ausculation bilaterally, normal work of  breathing Heart- Regular rate and rhythm, no murmurs, rubs or gallops, PMI not laterally displaced GI- soft, NT, ND, + BS Extremities- no clubbing, cyanosis, or edema MS- no significant deformity or atrophy Skin- no rash or lesion Psych- euthymic mood, full affect Neuro- strength and sensation are intact   EKG today reveals sinus rhythm 49 bpm, PR 174, Qtc 415 Event monitor is reviewed and reveals afib (5% afib burden)  Assessment and Plan:  1. Paroxysmal atrial fibrillation The patient has symptomatic recurrent AF despite amiodarone therapy.  Her chads2vasc score is at least 4.  She is not anticoagulated due to prior ICH on coumadin.  I think that our treatment options are presently limited.  WE could consider flecainide however I would not anticipate that this would be as successful as amiodarone.  We would have to allow amiodarone to washout for 3 months before we use tikosyn--> this is not a reasonable option.   I think that increasing amiodarone may be an option.  I will check an amiodarone level today.  I will also obtain an echo to evaluate for structural changes related to AF and to better determine our anticipated success rates of maintaining sinus long term based on LA size. I will speak with Dr Tamala Julian regarding the ability to anticoagulate the patient.  If she cannot be anticoagulated then ablation would not be an option.  It may be worth considering eliquis as an alternative to ASA based on AVERROES data.  2. HTN Stable No change required today  She will follow-up with Dr Tamala Julian in the next few weeks as scheduled I will see her back in 6 weeks to follow-up on the above.

## 2014-03-06 ENCOUNTER — Ambulatory Visit (HOSPITAL_COMMUNITY): Payer: Medicare Other | Attending: Cardiovascular Disease | Admitting: Cardiology

## 2014-03-06 DIAGNOSIS — I10 Essential (primary) hypertension: Secondary | ICD-10-CM | POA: Insufficient documentation

## 2014-03-06 DIAGNOSIS — I480 Paroxysmal atrial fibrillation: Secondary | ICD-10-CM

## 2014-03-06 DIAGNOSIS — L570 Actinic keratosis: Secondary | ICD-10-CM | POA: Diagnosis not present

## 2014-03-06 LAB — AMIODARONE LEVEL
AMIODARONE LVL: 1.3 ug/mL — AB (ref 1.5–2.5)
DESETHYLAMIODARONE: 1.3 ug/mL — AB (ref 1.5–2.5)

## 2014-03-06 NOTE — Progress Notes (Signed)
Echo performed. 

## 2014-03-09 ENCOUNTER — Ambulatory Visit (INDEPENDENT_AMBULATORY_CARE_PROVIDER_SITE_OTHER): Payer: Medicare Other | Admitting: Interventional Cardiology

## 2014-03-09 ENCOUNTER — Encounter: Payer: Self-pay | Admitting: Interventional Cardiology

## 2014-03-09 VITALS — BP 124/80 | HR 57 | Ht 66.0 in | Wt 184.0 lb

## 2014-03-09 DIAGNOSIS — I10 Essential (primary) hypertension: Secondary | ICD-10-CM | POA: Diagnosis not present

## 2014-03-09 DIAGNOSIS — R079 Chest pain, unspecified: Secondary | ICD-10-CM | POA: Diagnosis not present

## 2014-03-09 DIAGNOSIS — E7800 Pure hypercholesterolemia, unspecified: Secondary | ICD-10-CM

## 2014-03-09 DIAGNOSIS — Z79899 Other long term (current) drug therapy: Secondary | ICD-10-CM

## 2014-03-09 DIAGNOSIS — I4892 Unspecified atrial flutter: Secondary | ICD-10-CM | POA: Diagnosis not present

## 2014-03-09 DIAGNOSIS — I480 Paroxysmal atrial fibrillation: Secondary | ICD-10-CM

## 2014-03-09 MED ORDER — AMIODARONE HCL 200 MG PO TABS: 300.0000 mg | ORAL_TABLET | Freq: Every day | ORAL | Status: DC

## 2014-03-09 NOTE — Patient Instructions (Signed)
Your physician has recommended you make the following change in your medication:  1) INCREASE Amiodarone to 400mg  daily for 3 weeks. Then take 300mg  daily  You have a follow up appointment scheduled with Dr.Smith on 05/05/14 @ 11:45

## 2014-03-09 NOTE — Progress Notes (Signed)
Patient ID: Judy White, female   DOB: [birth year], 74 y.o.   MRN: [id number]    [address], [address],   [address] Phone: [phone] Fax:  [phone]  Date:  03/09/2014   ID:  Judy White, DOB 01/22/1941, MRN [id number]  PCP:  Judy Finer, MD   ASSESSMENT:  1. Paroxysmal atrial fibrillation with some therapeutic amiodarone dose 2. Unable to anticoagulate due to prior intracranial hemorrhage 3. Hypertension, essential 4. COPD  PLAN:  1. Increase amiodarone to 400 mg daily for 2 weeks then decrease to 300 mg per day 2. Clinical follow-up in 4 weeks 3. We will try to control paroxysmal A. fib by optimizing amiodarone therapy rather than anticoagulating the patient was had a prior intracranial hemorrhage. She may in the future be a referral for a watch man atrial occlusion device   SUBJECTIVE: Judy White is a 74 y.o. female who is back today in follow-up after being seen by Dr. Rayann Heman for atrial fibrillation management. Inability to use anticoagulation limits our ability to treat atrial fibrillation with attempted ablation or other modalities..Allred had the axon ID of check and an amiodarone level which is mildly decreased. She is having atrial fibrillation nearly every day. He causes her to feel weak and out of energy.   Wt Readings from Last 3 Encounters:  03/09/14 184 lb (83.462 kg)  03/04/14 184 lb (83.462 kg)  01/05/14 184 lb 9.6 oz (83.734 kg)     Past Medical History  Diagnosis Date  . Hypercholesteremia   . Osteopenia   . HTN (hypertension)   . Depressive disorder, not elsewhere classified   . Anxiety   . Paroxysmal atrial fibrillation   . Myalgia and myositis, unspecified   . COPD (chronic obstructive pulmonary disease)   . Overactive bladder     Current Outpatient Prescriptions  Medication Sig Dispense Refill  . amiodarone (PACERONE) 200 MG tablet Take 1 tablet (200 mg total) by mouth daily. 90 tablet 0  . Ascorbic Acid (VITAMIN  C PO) Take 1 tablet by mouth daily.     Marland Kitchen aspirin 81 MG tablet Take 81 mg by mouth daily.    . Calcium-Vitamin D (CALTRATE 600 PLUS-VIT D PO) Take 1 tablet by mouth daily.     . Cholecalciferol (VITAMIN D PO) Take 1 tablet by mouth daily.     . clonazePAM (KLONOPIN) 1 MG tablet Take 1 tablet by mouth 2 (two) times daily.    . Cyanocobalamin (VITAMIN B-12 PO) Take 1 tablet by mouth daily.     Marland Kitchen diltiazem (CARDIZEM CD) 120 MG 24 hr capsule Take 1 capsule (120 mg total) by mouth daily. 30 capsule 11  . FLUoxetine (PROZAC) 20 MG capsule Take 1 capsule by mouth daily.    Marland Kitchen HYDROcodone-acetaminophen (NORCO) 5-325 MG per tablet Take 2 tablets by mouth at bedtime as needed for moderate pain or severe pain. 6 tablet 0  . Omega-3 Fatty Acids (FISH OIL PO) Take 1 tablet by mouth daily.     Marland Kitchen oxybutynin (DITROPAN-XL) 10 MG 24 hr tablet Take 1 tablet by mouth daily.     No current facility-administered medications for this visit.    Allergies:   No Known Allergies  Social History:  The patient  reports that she has been smoking.  She does not have any smokeless tobacco history on file. She reports that she drinks alcohol. She reports that she does not use illicit drugs.   ROS:  Please  see the history of present illness.   No transient neurological complaints   All other systems reviewed and negative.   OBJECTIVE: VS:  BP 124/80 mmHg  Pulse 57  Ht 5\' 6"  (1.676 m)  Wt 184 lb (83.462 kg)  BMI 29.71 kg/m2 Well nourished, well developed, in no acute distress, appears frustrated, obese HEENT: normal Neck: JVD flat. Carotid bruit absent  Cardiac:  normal S1, S2; RRR; no murmur Lungs:  clear to auscultation bilaterally, no wheezing, rhonchi or rales Abd: soft, nontender, no hepatomegaly Ext: Edema absent. Pulses 2+ Skin: warm and dry Neuro:  CNs 2-12 intact, no focal abnormalities noted  EKG:  Not performed       Signed, Illene Labrador III, MD 03/09/2014 3:42 PM

## 2014-04-10 ENCOUNTER — Encounter: Payer: Self-pay | Admitting: Interventional Cardiology

## 2014-04-10 ENCOUNTER — Telehealth: Payer: Self-pay | Admitting: Interventional Cardiology

## 2014-04-10 NOTE — Telephone Encounter (Signed)
Patient states that her March 8th appointment with Dr. Tamala Julian was cancelled. She only has the February 25th appointment with Dr. Rayann Heman. Patient was reassured that Dr. Tamala Julian and Dr. Rayann Heman are working closely together and Dr. Tamala Julian will reschedule her appointment with him after she sees Dr. Rayann Heman and he provides his advisement on her status/AFib. Patient also concerned about dosage of Amariodone since Dr. Tamala Julian has been adjusting it. Request to know if she needs a level prior to the February appointment with Dr. Rayann Heman so that it can be adjusted. Routed to Dr. Tamala Julian.

## 2014-04-10 NOTE — Telephone Encounter (Signed)
New Message         Pt calling stating that she wants a return phone call from Lattie Haw in regards to an appointment that was cancelled that she had on 04/20/14 w/ Dr. Tamala Julian. Please call back and advise.

## 2014-04-10 NOTE — Telephone Encounter (Signed)
The patient should be on 300 mg of amiodarone daily.

## 2014-04-10 NOTE — Telephone Encounter (Signed)
Follow up     When did Dr Tamala Julian want to see the patient again?  She said we cancelled her march appt and now she is confused on when she is to come back.  Did Dr Tamala Julian want to see her?

## 2014-04-14 NOTE — Telephone Encounter (Signed)
Returned pt call. Adv her that the appt on 2/22 was made and cancelled in 1 min of each other. Dr.Smith would have like tot see her back after her appt with Dr.Allred on 2/25, that is why an appt was made on March 8, that appt info was given to her at her o//v with Dr.Smith on 1/11. She insist that I cancelled her appt on 3/8. The note showed it was cancelled by Dr.Smith's scheduler on 2/12 a day that I was not in the office, and the pt called in and cancelled it herself. She denied doing that.She is confused and sts that we need to get it together. Adv her that I will confirm with Dr.Smith when he would like to see her back and then call her back to schedule.

## 2014-04-14 NOTE — Telephone Encounter (Signed)
Let's decide when I should see her after she sees Dr. Rayann Heman.Marland Kitchen

## 2014-04-20 ENCOUNTER — Ambulatory Visit: Payer: Medicare Other | Admitting: Interventional Cardiology

## 2014-04-23 ENCOUNTER — Encounter: Payer: Self-pay | Admitting: Internal Medicine

## 2014-04-23 ENCOUNTER — Ambulatory Visit (INDEPENDENT_AMBULATORY_CARE_PROVIDER_SITE_OTHER): Payer: Medicare Other | Admitting: Internal Medicine

## 2014-04-23 VITALS — BP 130/74 | HR 51 | Ht 67.0 in | Wt 184.2 lb

## 2014-04-23 DIAGNOSIS — I10 Essential (primary) hypertension: Secondary | ICD-10-CM

## 2014-04-23 DIAGNOSIS — I480 Paroxysmal atrial fibrillation: Secondary | ICD-10-CM

## 2014-04-23 NOTE — Patient Instructions (Signed)
Your physician recommends that you schedule a follow-up appointment in: 6 weeks with Dr Tamala Julian and as needed with Dr Rayann Heman

## 2014-04-24 NOTE — Progress Notes (Signed)
Electrophysiology Office Note   Date:  04/24/2014   ID:  Judy White, DOB 12-Nov-1940, MRN [id number]  PCP:  Judy Heckle, MD  Cardiologist:  Dr Tamala Julian Primary Electrophysiologist: Judy Grayer, MD    Chief Complaint  Patient presents with  . Follow-up    PAF     History of Present Illness: Judy White is a 74 y.o. female who presents today for electrophysiology evaluation.   She has done well since I last saw her.  At that time, an amiodarone level was obtained which was low.  She had her amiodarone increased by Dr Tamala Julian and has done very well since.  He has been reluctant to start anticoagulation.  She feels that her afib has pretty much gone away with present amiodarone dosing.  She is quite pleased. Today, she denies symptoms of palpitations, chest pain, shortness of breath, orthopnea, PND, lower extremity edema, claudication, dizziness, presyncope, syncope, bleeding, or neurologic sequela. The patient is tolerating medications without difficulties and is otherwise without complaint today.    Past Medical History  Diagnosis Date  . Hypercholesteremia   . Osteopenia   . HTN (hypertension)   . Depressive disorder, not elsewhere classified   . Anxiety   . Paroxysmal atrial fibrillation   . Myalgia and myositis, unspecified   . COPD (chronic obstructive pulmonary disease)   . Overactive bladder    Past Surgical History  Procedure Laterality Date  . Tonsillectomy    . Appendectomy    . Cesarean section    . Wisdom tooth extraction    . Shoulder surgery    . Re-excision of breast lumpectomy       Current Outpatient Prescriptions  Medication Sig Dispense Refill  . amiodarone (PACERONE) 200 MG tablet Take 1.5 tablets (300 mg total) by mouth daily. 45 tablet 5  . Ascorbic Acid (VITAMIN C PO) Take 1 tablet by mouth daily.     Marland Kitchen aspirin 81 MG tablet Take 81 mg by mouth daily.    . Calcium-Vitamin D (CALTRATE 600 PLUS-VIT D PO) Take 1 tablet by mouth daily.     .  Cholecalciferol (VITAMIN D PO) Take 1 tablet by mouth daily.     . clonazePAM (KLONOPIN) 1 MG tablet Take 1 tablet by mouth 2 (two) times daily.    . Cyanocobalamin (VITAMIN B-12 PO) Take 1 tablet by mouth daily.     Marland Kitchen diltiazem (CARDIZEM CD) 120 MG 24 hr capsule Take 1 capsule (120 mg total) by mouth daily. 30 capsule 11  . FLUoxetine (PROZAC) 20 MG capsule Take 1 capsule by mouth daily.    . Omega-3 Fatty Acids (FISH OIL PO) Take 1 tablet by mouth daily.     Marland Kitchen oxybutynin (DITROPAN-XL) 10 MG 24 hr tablet Take 1 tablet by mouth daily.    . Potassium 75 MG TABS Take 1 tablet by mouth daily.     No current facility-administered medications for this visit.    Allergies:   Review of patient's allergies indicates no known allergies.   Social History:  The patient  reports that she has been smoking.  She does not have any smokeless tobacco history on file. She reports that she drinks alcohol. She reports that she does not use illicit drugs.   Family History:  The patient's  family history includes Aneurysm in her father; Cancer in an other family member; Heart Problems in an other family member; Heart disease in her brother; Hypotension in her mother; Neuropathy in her brother.  ROS:  Please see the history of present illness.   All other systems are reviewed and negative.    PHYSICAL EXAM: VS:  BP 130/74 mmHg  Pulse 51  Ht 5\' 7"  (1.702 m)  Wt 184 lb 3.2 oz (83.553 kg)  BMI 28.84 kg/m2 , BMI Body mass index is 28.84 kg/(m^2). GEN: Well nourished, well developed, in no acute distress HEENT: normal Neck: no JVD, carotid bruits, or masses Cardiac: RRR; no murmurs, rubs, or gallops,no edema  Respiratory:  clear to auscultation bilaterally, normal work of breathing GI: soft, nontender, nondistended, + BS MS: no deformity or atrophy Skin: warm and dry  Neuro:  Strength and sensation are intact Psych: euthymic mood, full affect  EKG:  EKG is ordered today. The ekg ordered today shows  sinus rhythm   Recent Labs: 10/22/2013: ALT 17; TSH 1.51 12/19/2013: BUN 21; Creatinine 0.96; Hemoglobin 15.6*; Platelets 197; Potassium 4.2; Sodium 142    Lipid Panel  No results found for: CHOL, TRIG, HDL, CHOLHDL, VLDL, LDLCALC, LDLDIRECT   Wt Readings from Last 3 Encounters:  04/23/14 184 lb 3.2 oz (83.553 kg)  03/09/14 184 lb (83.462 kg)  03/04/14 184 lb (83.462 kg)      Other studies Reviewed: Additional studies/ records that were reviewed today include: Dr Darliss Ridgel recent note   ASSESSMENT AND PLAN:   1. Paroxysmal atrial fibrillation The patient has symptomatic recurrent AF.  Her AF is better controlled with amiodarone 300mg  daily.  She is tolerating this dose without difficulty. I would recommend repeat amiodarone level as well as tfts/pfts/lfts when she sees Dr Tamala Julian again.  Her chads2vasc score is at least 4.  She is not anticoagulated due to prior ICH on coumadin.  It may be worth considering eliquis as an alternative to ASA based on AVERROES data.  I also discussed left atrial appendage occlusion (Watchman) with her today.  She is very interested in this option.  She would be willing to take anticoagulation for several months around the time of the procedure.  Once we have this technology available in our area, I will contact the patient.  2. HTN Stable No change required today  She will follow-up with Dr Tamala Julian in 6 weeks to follow-up on the above. I will see as needed as long as her afib is controlled but will plan to touch base with her once we have Watchman therapy available.    Current medicines are reviewed at length with the patient today.   The patient does not have concerns regarding her medicines.  The following changes were made today:  none  Labs/ tests ordered today include:  Orders Placed This Encounter  Procedures  . EKG 12-Lead    Signed, Judy Grayer, MD  04/24/2014 10:30 PM     [place] [address] [phone] (office) [phone] (fax)

## 2014-05-05 ENCOUNTER — Ambulatory Visit: Payer: Medicare Other | Admitting: Interventional Cardiology

## 2014-05-13 ENCOUNTER — Telehealth: Payer: Self-pay | Admitting: Interventional Cardiology

## 2014-05-13 NOTE — Telephone Encounter (Signed)
New problem   Pt stated her legs are aching and she want to know why, it could be because of some medication. Please advise pt.

## 2014-05-14 NOTE — Telephone Encounter (Signed)
returned pt call. lmtcb 

## 2014-06-05 NOTE — Telephone Encounter (Signed)
Called to called on pt.lmtcb if assistance is needed.

## 2014-06-11 ENCOUNTER — Encounter: Payer: Self-pay | Admitting: Interventional Cardiology

## 2014-06-11 ENCOUNTER — Ambulatory Visit (INDEPENDENT_AMBULATORY_CARE_PROVIDER_SITE_OTHER): Payer: Medicare Other | Admitting: Interventional Cardiology

## 2014-06-11 VITALS — BP 130/62 | HR 54 | Ht 67.0 in | Wt 186.8 lb

## 2014-06-11 DIAGNOSIS — I10 Essential (primary) hypertension: Secondary | ICD-10-CM

## 2014-06-11 DIAGNOSIS — Z79899 Other long term (current) drug therapy: Secondary | ICD-10-CM | POA: Diagnosis not present

## 2014-06-11 DIAGNOSIS — I480 Paroxysmal atrial fibrillation: Secondary | ICD-10-CM | POA: Diagnosis not present

## 2014-06-11 DIAGNOSIS — E7800 Pure hypercholesterolemia, unspecified: Secondary | ICD-10-CM

## 2014-06-11 NOTE — Progress Notes (Signed)
Cardiology Office Note   Date:  06/11/2014   ID:  Judy White, DOB [birth year], MRN [id number]  PCP:  Dorian Heckle, MD  Cardiologist:   Sinclair Grooms, MD   Chief Complaint  Patient presents with  . Atrial Fibrillation      History of Present Illness: Judy White is a 74 y.o. female who presents for paroxysmal atrial fibrillation, chronic amiodarone therapy, hypertension, and diastolic heart failure.  Earlier this year we will having frequent episodes of prolonged tachycardia related atrial fibrillation. She is unable to take anticoagulation due to prior intracranial bleeding. She had an EP consultation earlier this she and ablation was excluded based upon patient preference. Her amiodarone levels were low and we increased the dose to 300 mg per day. She has been on that dose now for greater than a month and episodes of atrial fibrillation are much less frequent. No obvious medication toxicity.  She has noted lower extremity swelling and is having some exertional dyspnea.    Past Medical History  Diagnosis Date  . Hypercholesteremia   . Osteopenia   . HTN (hypertension)   . Depressive disorder, not elsewhere classified   . Anxiety   . Paroxysmal atrial fibrillation   . Myalgia and myositis, unspecified   . COPD (chronic obstructive pulmonary disease)   . Overactive bladder     Past Surgical History  Procedure Laterality Date  . Tonsillectomy    . Appendectomy    . Cesarean section    . Wisdom tooth extraction    . Shoulder surgery    . Re-excision of breast lumpectomy       Current Outpatient Prescriptions  Medication Sig Dispense Refill  . amiodarone (PACERONE) 200 MG tablet Take 1.5 tablets (300 mg total) by mouth daily. 45 tablet 5  . Ascorbic Acid (VITAMIN C PO) Take 500 mg by mouth daily.     Marland Kitchen aspirin 81 MG tablet Take 81 mg by mouth daily.    . Calcium-Vitamin D (CALTRATE 600 PLUS-VIT D PO) Take 1 tablet by mouth daily.     . Cholecalciferol  (VITAMIN D PO) Take 1 tablet by mouth daily.     . clonazePAM (KLONOPIN) 1 MG tablet Take 1 tablet by mouth 2 (two) times daily.    . Cyanocobalamin (VITAMIN B-12 PO) Take 1 tablet by mouth daily.     Marland Kitchen diltiazem (CARDIZEM CD) 120 MG 24 hr capsule Take 1 capsule (120 mg total) by mouth daily. 30 capsule 11  . FLUoxetine (PROZAC) 20 MG capsule Take 1 capsule by mouth daily.    . Omega-3 Fatty Acids (FISH OIL PO) Take 1 tablet by mouth daily.     Marland Kitchen oxybutynin (DITROPAN-XL) 10 MG 24 hr tablet Take 1 tablet by mouth daily.    . Potassium 75 MG TABS Take 1 tablet by mouth daily.     No current facility-administered medications for this visit.    Allergies:   Review of patient's allergies indicates no known allergies.    Social History:  The patient  reports that she has been smoking.  She does not have any smokeless tobacco history on file. She reports that she drinks alcohol. She reports that she does not use illicit drugs.   Family History:  The patient's family history includes Aneurysm in her father; Cancer in an other family member; Heart Problems in an other family member; Heart disease in her brother; Hypotension in her mother; Neuropathy in her brother.  ROS:  Please see the history of present illness.   Otherwise, review of systems are positive for ankle edema, frustration concerning the current practice environment, and weak knees and difficulty with ambulation that she feels is related to her back..   All other systems are reviewed and negative.    PHYSICAL EXAM: VS:  BP 130/62 mmHg  Pulse 54  Ht 5\' 7"  (1.702 m)  Wt 186 lb 12.8 oz (84.732 kg)  BMI 29.25 kg/m2 , BMI Body mass index is 29.25 kg/(m^2). GEN: Well nourished, well developed, in no acute distress HEENT: normal Neck: no JVD, carotid bruits, or masses Cardiac: RRR; no murmurs, rubs, or gallops,no edema  Respiratory:  clear to auscultation bilaterally, normal work of breathing GI: soft, nontender, nondistended, +  BS MS: no deformity or atrophy Skin: warm and dry, no rash Neuro:  Strength and sensation are intact Psych: euthymic mood, full affect   EKG:  EKG is not ordered today.    Recent Labs: 10/22/2013: ALT 17; TSH 1.51 12/19/2013: BUN 21; Creatinine 0.96; Hemoglobin 15.6*; Platelets 197; Potassium 4.2; Sodium 142    Lipid Panel No results found for: CHOL, TRIG, HDL, CHOLHDL, VLDL, LDLCALC, LDLDIRECT    Wt Readings from Last 3 Encounters:  06/11/14 186 lb 12.8 oz (84.732 kg)  04/23/14 184 lb 3.2 oz (83.553 kg)  03/09/14 184 lb (83.462 kg)      Other studies Reviewed: Additional studies/ records that were reviewed today include:   ASSESSMENT AND PLAN:  Paroxysmal atrial fibrillation: Marked decrease in tachycardia and palpitations  Essential hypertension  Hypercholesteremia  On amiodarone therapy: We'll decrease dose of amiodarone to 200 mg per day in mid May.     Current medicines are reviewed at length with the patient today.  The patient has concerns regarding medicines.  The following changes have been made:  Would like to be on no medications of possible. We have planned to to return amiodarone to 200 mg per day. Other medications will not be changed.  Labs/ tests ordered today include:  No orders of the defined types were placed in this encounter.     Disposition:   FU with Linard Millers in 2 months   Signed, Sinclair Grooms, MD  06/11/2014 12:29 PM    [address], [place], [place]  [address] Phone: [phone]; Fax: [phone]

## 2014-06-11 NOTE — Patient Instructions (Addendum)
Your physician has recommended you make the following change in your medication:  1) STARTING mid May decrease Amiodarone to 200mg  daily  Your physician recommends that you schedule a follow-up appointment in: 10 weeks

## 2014-07-02 DIAGNOSIS — Z0000 Encounter for general adult medical examination without abnormal findings: Secondary | ICD-10-CM | POA: Diagnosis not present

## 2014-07-02 DIAGNOSIS — M8580 Other specified disorders of bone density and structure, unspecified site: Secondary | ICD-10-CM | POA: Diagnosis not present

## 2014-07-02 DIAGNOSIS — F419 Anxiety disorder, unspecified: Secondary | ICD-10-CM | POA: Diagnosis not present

## 2014-07-02 DIAGNOSIS — Z79899 Other long term (current) drug therapy: Secondary | ICD-10-CM | POA: Diagnosis not present

## 2014-07-02 DIAGNOSIS — E782 Mixed hyperlipidemia: Secondary | ICD-10-CM | POA: Diagnosis not present

## 2014-07-02 DIAGNOSIS — I480 Paroxysmal atrial fibrillation: Secondary | ICD-10-CM | POA: Diagnosis not present

## 2014-07-02 DIAGNOSIS — I10 Essential (primary) hypertension: Secondary | ICD-10-CM | POA: Diagnosis not present

## 2014-07-02 DIAGNOSIS — M5432 Sciatica, left side: Secondary | ICD-10-CM | POA: Diagnosis not present

## 2014-07-02 DIAGNOSIS — F329 Major depressive disorder, single episode, unspecified: Secondary | ICD-10-CM | POA: Diagnosis not present

## 2014-07-02 DIAGNOSIS — Z1389 Encounter for screening for other disorder: Secondary | ICD-10-CM | POA: Diagnosis not present

## 2014-07-22 DIAGNOSIS — M4316 Spondylolisthesis, lumbar region: Secondary | ICD-10-CM | POA: Diagnosis not present

## 2014-07-22 DIAGNOSIS — Z6829 Body mass index (BMI) 29.0-29.9, adult: Secondary | ICD-10-CM | POA: Diagnosis not present

## 2014-07-22 DIAGNOSIS — M545 Low back pain: Secondary | ICD-10-CM | POA: Diagnosis not present

## 2014-07-25 DIAGNOSIS — M4316 Spondylolisthesis, lumbar region: Secondary | ICD-10-CM | POA: Diagnosis not present

## 2014-07-25 DIAGNOSIS — M5126 Other intervertebral disc displacement, lumbar region: Secondary | ICD-10-CM | POA: Diagnosis not present

## 2014-07-29 HISTORY — PX: POSTERIOR FUSION LUMBAR SPINE: SUR632

## 2014-07-30 DIAGNOSIS — Z6829 Body mass index (BMI) 29.0-29.9, adult: Secondary | ICD-10-CM | POA: Diagnosis not present

## 2014-07-30 DIAGNOSIS — M4316 Spondylolisthesis, lumbar region: Secondary | ICD-10-CM | POA: Diagnosis not present

## 2014-07-31 DIAGNOSIS — F3341 Major depressive disorder, recurrent, in partial remission: Secondary | ICD-10-CM | POA: Diagnosis not present

## 2014-08-03 ENCOUNTER — Other Ambulatory Visit: Payer: Self-pay | Admitting: Neurological Surgery

## 2014-08-06 NOTE — Pre-Procedure Instructions (Signed)
    Judy White  08/06/2014      [facility] - [place], [place] [address] Phone: [phone] Fax: [phone]    Your procedure is scheduled on Tuesday, June 14th, 2016 at 7:30 AM.  Report to [place] at 5:30 A.M.  Call this number if you have problems the morning of surgery:  [phone]   Remember:  Do not eat food or drink liquids after midnight.   Take these medicines the morning of surgery with A SIP OF WATER Amiodarone (Pacerone), Clonazepam (Klonopin), Diltiazem (Cardizem CD), Fluoxetine (Prozac).   Stop taking: Aspirin, Ibuprofen, Goody's, BC's, Fish oil, Herbal medications.    Do not wear jewelry, make-up or nail polish.  Do not wear lotions, powders, or perfumes.  You may wear deodorant.  Do not shave 48 hours prior to surgery.    Do not bring valuables to the hospital.  [facility] is not responsible for any belongings or valuables.  Contacts, dentures or bridgework may not be worn into surgery.  Leave your suitcase in the car.  After surgery it may be brought to your room.  For patients admitted to the hospital, discharge time will be determined by your treatment team.  Patients discharged the day of surgery will not be allowed to drive home.    Special instructions:  See attached.  Please read over the following fact sheets that you were given. Pain Booklet, Coughing and Deep Breathing, Blood Transfusion Information, MRSA Information and Surgical Site Infection Prevention

## 2014-08-07 ENCOUNTER — Encounter (HOSPITAL_COMMUNITY)
Admission: RE | Admit: 2014-08-07 | Discharge: 2014-08-07 | Disposition: A | Discharge status: Home / Self Care | Payer: Medicare Other | Source: Ambulatory Visit | Attending: Neurological Surgery | Admitting: Neurological Surgery

## 2014-08-07 ENCOUNTER — Encounter (HOSPITAL_COMMUNITY): Payer: Self-pay

## 2014-08-07 HISTORY — DX: Cardiac arrhythmia, unspecified: I49.9

## 2014-08-07 LAB — TYPE AND SCREEN
ABO/RH(D): O NEG
Antibody Screen: NEGATIVE

## 2014-08-07 LAB — BASIC METABOLIC PANEL
ANION GAP: 8 (ref 5–15)
BUN: 18 mg/dL (ref 6–20)
CHLORIDE: 102 mmol/L (ref 101–111)
CO2: 29 mmol/L (ref 22–32)
Calcium: 9 mg/dL (ref 8.9–10.3)
Creatinine, Ser: 1.1 mg/dL — ABNORMAL HIGH (ref 0.44–1.00)
GFR calc Af Amer: 55 mL/min — ABNORMAL LOW (ref >60)
GFR, EST NON AFRICAN AMERICAN: 48 mL/min — AB (ref >60)
Glucose, Bld: 90 mg/dL (ref 65–99)
POTASSIUM: 4.5 mmol/L (ref 3.5–5.1)
Sodium: 139 mmol/L (ref 135–145)

## 2014-08-07 LAB — CBC
HCT: 45.9 % (ref 36.0–46.0)
HEMOGLOBIN: 15.4 g/dL — AB (ref 12.0–15.0)
MCH: 30.1 pg (ref 26.0–34.0)
MCHC: 33.6 g/dL (ref 30.0–36.0)
MCV: 89.6 fL (ref 78.0–100.0)
Platelets: 206 10*3/uL (ref 150–400)
RBC: 5.12 MIL/uL — ABNORMAL HIGH (ref 3.87–5.11)
RDW: 13.9 % (ref 11.5–15.5)
WBC: 10.3 10*3/uL (ref 4.0–10.5)

## 2014-08-07 LAB — SURGICAL PCR SCREEN
MRSA, PCR: NEGATIVE
Staphylococcus aureus: POSITIVE — AB

## 2014-08-07 NOTE — Progress Notes (Signed)
Anesthesia Chart Review:  Pt is 74 year old female scheduled for L4-5 PLIF on 08/11/2014 with Dr. Ellene Route.   Cardiologist is Dr. Tamala Julian, last office visit 06/11/14. EP cardiologist is Dr. Rayann Heman, last office visit 04/24/14.  PMH includes: PAF, HTN, hypercholesterolemia, COPD. Current smoker. BMI 29.  Medications include: amiodarone, ASA, diltiazem  Preoperative labs reviewed.    Chest x-ray 12/19/2013 reviewed: Lingular scarring. No acute cardiopulmonary diease.  EKG 04/23/2014: sinus bradycardia (51 bpm). Septal infarct, age undetermined. Dr. Rayann Heman interprets EKG as sinus bradycardia only.   Echo 03/06/2014: - Left ventricle: Inferobasal hypokinesis The cavity size was normal. There was mild focal basal hypertrophy of the septum. Systolic function was normal. The estimated ejection fraction was 55%. Wall motion was normal; there were no regional wall motion abnormalities. - Left atrium: The atrium was mildly dilated. - Atrial septum: No defect or patent foramen ovale was identified.  Nuclear stress test 05/30/2013: Normal stress nuclear study. LV Wall Motion: NL LV Function, EF 76%; NL Wall Motion  If no changes, I anticipate pt can proceed with surgery as scheduled.   Willeen Cass, FNP-BC [facility]/Anesthesiology Phone: [phone] 08/07/2014 3:52 PM

## 2014-08-11 ENCOUNTER — Inpatient Hospital Stay (HOSPITAL_COMMUNITY): Payer: Medicare Other

## 2014-08-11 ENCOUNTER — Inpatient Hospital Stay (HOSPITAL_COMMUNITY)
Admission: RE | Admit: 2014-08-11 | Discharge: 2014-08-13 | DRG: 460 | Disposition: A | Discharge status: Home / Self Care | Payer: Medicare Other | Source: Ambulatory Visit | Attending: Neurological Surgery | Admitting: Neurological Surgery

## 2014-08-11 ENCOUNTER — Encounter (HOSPITAL_COMMUNITY): Payer: Self-pay | Admitting: *Deleted

## 2014-08-11 ENCOUNTER — Inpatient Hospital Stay (HOSPITAL_COMMUNITY): Payer: Medicare Other | Admitting: Emergency Medicine

## 2014-08-11 ENCOUNTER — Inpatient Hospital Stay (HOSPITAL_COMMUNITY): Payer: Medicare Other | Admitting: Anesthesiology

## 2014-08-11 ENCOUNTER — Encounter (HOSPITAL_COMMUNITY)
Admission: RE | Disposition: A | Discharge status: Home / Self Care | Payer: Medicare Other | Source: Ambulatory Visit | Attending: Neurological Surgery

## 2014-08-11 DIAGNOSIS — Z01812 Encounter for preprocedural laboratory examination: Secondary | ICD-10-CM | POA: Diagnosis not present

## 2014-08-11 DIAGNOSIS — M8580 Other specified disorders of bone density and structure, unspecified site: Secondary | ICD-10-CM | POA: Diagnosis present

## 2014-08-11 DIAGNOSIS — Z7982 Long term (current) use of aspirin: Secondary | ICD-10-CM | POA: Diagnosis not present

## 2014-08-11 DIAGNOSIS — I480 Paroxysmal atrial fibrillation: Secondary | ICD-10-CM | POA: Diagnosis present

## 2014-08-11 DIAGNOSIS — M48061 Spinal stenosis, lumbar region without neurogenic claudication: Secondary | ICD-10-CM | POA: Diagnosis present

## 2014-08-11 DIAGNOSIS — Z79899 Other long term (current) drug therapy: Secondary | ICD-10-CM

## 2014-08-11 DIAGNOSIS — F17210 Nicotine dependence, cigarettes, uncomplicated: Secondary | ICD-10-CM | POA: Diagnosis present

## 2014-08-11 DIAGNOSIS — I10 Essential (primary) hypertension: Secondary | ICD-10-CM | POA: Diagnosis present

## 2014-08-11 DIAGNOSIS — J449 Chronic obstructive pulmonary disease, unspecified: Secondary | ICD-10-CM | POA: Diagnosis present

## 2014-08-11 DIAGNOSIS — E780 Pure hypercholesterolemia: Secondary | ICD-10-CM | POA: Diagnosis present

## 2014-08-11 DIAGNOSIS — M4806 Spinal stenosis, lumbar region: Secondary | ICD-10-CM | POA: Diagnosis present

## 2014-08-11 DIAGNOSIS — M4316 Spondylolisthesis, lumbar region: Secondary | ICD-10-CM | POA: Diagnosis present

## 2014-08-11 DIAGNOSIS — M5416 Radiculopathy, lumbar region: Secondary | ICD-10-CM | POA: Diagnosis present

## 2014-08-11 DIAGNOSIS — F419 Anxiety disorder, unspecified: Secondary | ICD-10-CM | POA: Diagnosis present

## 2014-08-11 DIAGNOSIS — Z419 Encounter for procedure for purposes other than remedying health state, unspecified: Secondary | ICD-10-CM

## 2014-08-11 DIAGNOSIS — F329 Major depressive disorder, single episode, unspecified: Secondary | ICD-10-CM | POA: Diagnosis present

## 2014-08-11 DIAGNOSIS — Z981 Arthrodesis status: Secondary | ICD-10-CM | POA: Diagnosis not present

## 2014-08-11 SURGERY — POSTERIOR LUMBAR FUSION 1 LEVEL
Anesthesia: General | Site: Back

## 2014-08-11 MED ORDER — BISACODYL 10 MG RE SUPP: 10.0000 mg | Freq: Every day | RECTAL | Status: DC | PRN

## 2014-08-11 MED ORDER — MIDAZOLAM HCL 5 MG/5ML IJ SOLN
INTRAMUSCULAR | Status: DC | PRN
  Administered 2014-08-11: 1 mg via INTRAVENOUS

## 2014-08-11 MED ORDER — OXYBUTYNIN CHLORIDE ER 10 MG PO TB24
10.0000 mg | ORAL_TABLET | Freq: Every day | ORAL | Status: DC
  Administered 2014-08-12 – 2014-08-13 (×2): 10 mg via ORAL
  Filled 2014-08-11 (×2): qty 1

## 2014-08-11 MED ORDER — KETOROLAC TROMETHAMINE 0.5 % OP SOLN
1.0000 [drp] | Freq: Three times a day (TID) | OPHTHALMIC | Status: DC | PRN
  Administered 2014-08-11 – 2014-08-12 (×2): 1 [drp] via OPHTHALMIC
  Filled 2014-08-11: qty 3

## 2014-08-11 MED ORDER — BUPIVACAINE HCL (PF) 0.5 % IJ SOLN
INTRAMUSCULAR | Status: DC | PRN
  Administered 2014-08-11: 5 mL

## 2014-08-11 MED ORDER — NEOSTIGMINE METHYLSULFATE 10 MG/10ML IV SOLN
INTRAVENOUS | Status: DC | PRN
  Administered 2014-08-11: 3 mg via INTRAVENOUS

## 2014-08-11 MED ORDER — CEFAZOLIN SODIUM 1-5 GM-% IV SOLN
1.0000 g | Freq: Three times a day (TID) | INTRAVENOUS | Status: AC
  Administered 2014-08-11 (×2): 1 g via INTRAVENOUS
  Filled 2014-08-11 (×2): qty 50

## 2014-08-11 MED ORDER — SENNA 8.6 MG PO TABS
1.0000 | ORAL_TABLET | Freq: Two times a day (BID) | ORAL | Status: DC
  Administered 2014-08-11 – 2014-08-13 (×5): 8.6 mg via ORAL
  Filled 2014-08-11 (×5): qty 1

## 2014-08-11 MED ORDER — SODIUM CHLORIDE 0.9 % IJ SOLN
3.0000 mL | Freq: Two times a day (BID) | INTRAMUSCULAR | Status: DC
  Administered 2014-08-12 (×2): 3 mL via INTRAVENOUS

## 2014-08-11 MED ORDER — ALUM & MAG HYDROXIDE-SIMETH 200-200-20 MG/5ML PO SUSP: 30.0000 mL | Freq: Four times a day (QID) | ORAL | Status: DC | PRN

## 2014-08-11 MED ORDER — HYDROMORPHONE HCL 1 MG/ML IJ SOLN: 0.5000 mg | INTRAMUSCULAR | Status: DC | PRN

## 2014-08-11 MED ORDER — KETOROLAC TROMETHAMINE 15 MG/ML IJ SOLN
INTRAMUSCULAR | Status: AC
  Administered 2014-08-11: 15 mg via INTRAVENOUS
  Filled 2014-08-11: qty 1

## 2014-08-11 MED ORDER — SODIUM CHLORIDE 0.9 % IJ SOLN: 3.0000 mL | INTRAMUSCULAR | Status: DC | PRN

## 2014-08-11 MED ORDER — ACETAMINOPHEN 325 MG PO TABS
650.0000 mg | ORAL_TABLET | ORAL | Status: DC | PRN
  Administered 2014-08-12: 650 mg via ORAL
  Filled 2014-08-11: qty 2

## 2014-08-11 MED ORDER — MUPIROCIN 2 % EX OINT
TOPICAL_OINTMENT | CUTANEOUS | Status: AC
  Administered 2014-08-11: 1
  Filled 2014-08-11: qty 22

## 2014-08-11 MED ORDER — DEXAMETHASONE SODIUM PHOSPHATE 10 MG/ML IJ SOLN
INTRAMUSCULAR | Status: DC | PRN
  Administered 2014-08-11: 10 mg via INTRAVENOUS

## 2014-08-11 MED ORDER — ONDANSETRON HCL 4 MG/2ML IJ SOLN
INTRAMUSCULAR | Status: AC
  Filled 2014-08-11: qty 2

## 2014-08-11 MED ORDER — KETOROLAC TROMETHAMINE 0.5 % OP SOLN
1.0000 [drp] | Freq: Three times a day (TID) | OPHTHALMIC | Status: DC | PRN
  Administered 2014-08-11: 1 [drp] via OPHTHALMIC
  Filled 2014-08-11: qty 3

## 2014-08-11 MED ORDER — FENTANYL CITRATE (PF) 100 MCG/2ML IJ SOLN
INTRAMUSCULAR | Status: DC | PRN
  Administered 2014-08-11 (×4): 50 ug via INTRAVENOUS

## 2014-08-11 MED ORDER — LIDOCAINE-EPINEPHRINE 1 %-1:100000 IJ SOLN
INTRAMUSCULAR | Status: DC | PRN
  Administered 2014-08-11: 5 mL

## 2014-08-11 MED ORDER — EPHEDRINE SULFATE 50 MG/ML IJ SOLN
INTRAMUSCULAR | Status: DC | PRN
  Administered 2014-08-11 (×2): 10 mg via INTRAVENOUS

## 2014-08-11 MED ORDER — ONDANSETRON HCL 4 MG/2ML IJ SOLN
INTRAMUSCULAR | Status: DC | PRN
  Administered 2014-08-11: 4 mg via INTRAVENOUS

## 2014-08-11 MED ORDER — PROPOFOL 10 MG/ML IV BOLUS
INTRAVENOUS | Status: DC | PRN
  Administered 2014-08-11: 120 mg via INTRAVENOUS

## 2014-08-11 MED ORDER — MAGNESIUM CITRATE PO SOLN
1.0000 | Freq: Once | ORAL | Status: AC | PRN
  Filled 2014-08-11: qty 296

## 2014-08-11 MED ORDER — DILTIAZEM HCL ER COATED BEADS 120 MG PO CP24
120.0000 mg | ORAL_CAPSULE | Freq: Every day | ORAL | Status: DC
  Administered 2014-08-12 – 2014-08-13 (×2): 120 mg via ORAL
  Filled 2014-08-11 (×2): qty 1

## 2014-08-11 MED ORDER — ACETAMINOPHEN 650 MG RE SUPP: 650.0000 mg | RECTAL | Status: DC | PRN

## 2014-08-11 MED ORDER — POTASSIUM 75 MG PO TABS: 1.0000 | ORAL_TABLET | Freq: Every day | ORAL | Status: DC

## 2014-08-11 MED ORDER — MEPERIDINE HCL 25 MG/ML IJ SOLN: 6.2500 mg | INTRAMUSCULAR | Status: DC | PRN

## 2014-08-11 MED ORDER — HYDROCODONE-ACETAMINOPHEN 5-325 MG PO TABS
1.0000 | ORAL_TABLET | ORAL | Status: DC | PRN
  Administered 2014-08-12 – 2014-08-13 (×2): 1 via ORAL
  Administered 2014-08-13 (×2): 2 via ORAL
  Administered 2014-08-13: 1 via ORAL
  Filled 2014-08-11: qty 1
  Filled 2014-08-11 (×2): qty 2
  Filled 2014-08-11 (×2): qty 1

## 2014-08-11 MED ORDER — BSS IO SOLN: 15.0000 mL | Freq: Once | INTRAOCULAR | Status: DC

## 2014-08-11 MED ORDER — KETOROLAC TROMETHAMINE 15 MG/ML IJ SOLN
15.0000 mg | Freq: Four times a day (QID) | INTRAMUSCULAR | Status: AC
  Administered 2014-08-11 – 2014-08-12 (×5): 15 mg via INTRAVENOUS
  Filled 2014-08-11 (×7): qty 1

## 2014-08-11 MED ORDER — DOCUSATE SODIUM 100 MG PO CAPS
100.0000 mg | ORAL_CAPSULE | Freq: Two times a day (BID) | ORAL | Status: DC
Start: 2014-08-11 — End: 2014-08-13
  Administered 2014-08-11 – 2014-08-13 (×5): 100 mg via ORAL
  Filled 2014-08-11 (×5): qty 1

## 2014-08-11 MED ORDER — ARTIFICIAL TEARS OP OINT
TOPICAL_OINTMENT | OPHTHALMIC | Status: AC
  Filled 2014-08-11: qty 3.5

## 2014-08-11 MED ORDER — LACTATED RINGERS IV SOLN
INTRAVENOUS | Status: DC | PRN
  Administered 2014-08-11 (×2): via INTRAVENOUS

## 2014-08-11 MED ORDER — ROCURONIUM BROMIDE 50 MG/5ML IV SOLN
INTRAVENOUS | Status: AC
  Filled 2014-08-11: qty 1

## 2014-08-11 MED ORDER — SODIUM CHLORIDE 0.9 % IJ SOLN
INTRAMUSCULAR | Status: AC
  Filled 2014-08-11: qty 10

## 2014-08-11 MED ORDER — DEXTROSE 5 % IV SOLN
500.0000 mg | Freq: Four times a day (QID) | INTRAVENOUS | Status: DC | PRN
  Filled 2014-08-11: qty 5

## 2014-08-11 MED ORDER — EPHEDRINE SULFATE 50 MG/ML IJ SOLN
INTRAMUSCULAR | Status: AC
  Filled 2014-08-11: qty 1

## 2014-08-11 MED ORDER — FENTANYL CITRATE (PF) 250 MCG/5ML IJ SOLN
INTRAMUSCULAR | Status: AC
  Filled 2014-08-11: qty 5

## 2014-08-11 MED ORDER — ALBUMIN HUMAN 5 % IV SOLN
INTRAVENOUS | Status: DC | PRN
  Administered 2014-08-11: 08:00:00 via INTRAVENOUS

## 2014-08-11 MED ORDER — CLONAZEPAM 1 MG PO TABS
1.0000 mg | ORAL_TABLET | Freq: Two times a day (BID) | ORAL | Status: DC
  Administered 2014-08-11 – 2014-08-13 (×4): 1 mg via ORAL
  Filled 2014-08-11 (×4): qty 1

## 2014-08-11 MED ORDER — OXYCODONE-ACETAMINOPHEN 5-325 MG PO TABS: 1.0000 | ORAL_TABLET | ORAL | Status: DC | PRN

## 2014-08-11 MED ORDER — PHENYLEPHRINE 40 MCG/ML (10ML) SYRINGE FOR IV PUSH (FOR BLOOD PRESSURE SUPPORT)
PREFILLED_SYRINGE | INTRAVENOUS | Status: AC
  Filled 2014-08-11: qty 10

## 2014-08-11 MED ORDER — ONDANSETRON HCL 4 MG/2ML IJ SOLN: 4.0000 mg | Freq: Once | INTRAMUSCULAR | Status: DC | PRN

## 2014-08-11 MED ORDER — NEOSTIGMINE METHYLSULFATE 10 MG/10ML IV SOLN
INTRAVENOUS | Status: AC
  Filled 2014-08-11: qty 1

## 2014-08-11 MED ORDER — SODIUM CHLORIDE 0.9 % IR SOLN
Status: DC | PRN
  Administered 2014-08-11: 09:00:00

## 2014-08-11 MED ORDER — CEFAZOLIN SODIUM-DEXTROSE 2-3 GM-% IV SOLR
2.0000 g | INTRAVENOUS | Status: AC
  Administered 2014-08-11: 2 g via INTRAVENOUS

## 2014-08-11 MED ORDER — ONDANSETRON HCL 4 MG/2ML IJ SOLN: 4.0000 mg | INTRAMUSCULAR | Status: DC | PRN

## 2014-08-11 MED ORDER — PHENOL 1.4 % MT LIQD: 1.0000 | OROMUCOSAL | Status: DC | PRN

## 2014-08-11 MED ORDER — DEXAMETHASONE SODIUM PHOSPHATE 10 MG/ML IJ SOLN
INTRAMUSCULAR | Status: AC
  Filled 2014-08-11: qty 1

## 2014-08-11 MED ORDER — HYDROMORPHONE HCL 1 MG/ML IJ SOLN
INTRAMUSCULAR | Status: AC
  Administered 2014-08-11: 0.25 mg via INTRAVENOUS
  Filled 2014-08-11: qty 1

## 2014-08-11 MED ORDER — LIDOCAINE HCL (CARDIAC) 20 MG/ML IV SOLN
INTRAVENOUS | Status: AC
  Filled 2014-08-11: qty 5

## 2014-08-11 MED ORDER — MENTHOL 3 MG MT LOZG: 1.0000 | LOZENGE | OROMUCOSAL | Status: DC | PRN

## 2014-08-11 MED ORDER — THROMBIN 20000 UNITS EX SOLR
CUTANEOUS | Status: DC | PRN
  Administered 2014-08-11: 09:00:00 via TOPICAL

## 2014-08-11 MED ORDER — SODIUM CHLORIDE 0.9 % IN NEBU
INHALATION_SOLUTION | RESPIRATORY_TRACT | Status: AC
  Filled 2014-08-11: qty 3

## 2014-08-11 MED ORDER — SUCCINYLCHOLINE CHLORIDE 20 MG/ML IJ SOLN
INTRAMUSCULAR | Status: AC
  Filled 2014-08-11: qty 2

## 2014-08-11 MED ORDER — MIDAZOLAM HCL 2 MG/2ML IJ SOLN
INTRAMUSCULAR | Status: AC
  Filled 2014-08-11: qty 2

## 2014-08-11 MED ORDER — GLYCOPYRROLATE 0.2 MG/ML IJ SOLN
INTRAMUSCULAR | Status: DC | PRN
  Administered 2014-08-11 (×2): 0.1 mg via INTRAVENOUS
  Administered 2014-08-11: 0.4 mg via INTRAVENOUS

## 2014-08-11 MED ORDER — ARTIFICIAL TEARS OP OINT
TOPICAL_OINTMENT | OPHTHALMIC | Status: DC | PRN
  Administered 2014-08-11: 1 via OPHTHALMIC

## 2014-08-11 MED ORDER — ROCURONIUM BROMIDE 100 MG/10ML IV SOLN
INTRAVENOUS | Status: DC | PRN
  Administered 2014-08-11: 50 mg via INTRAVENOUS

## 2014-08-11 MED ORDER — HYDROMORPHONE HCL 1 MG/ML IJ SOLN
0.2500 mg | INTRAMUSCULAR | Status: DC | PRN
  Administered 2014-08-11 (×2): 0.25 mg via INTRAVENOUS
  Administered 2014-08-11: 0.5 mg via INTRAVENOUS

## 2014-08-11 MED ORDER — 0.9 % SODIUM CHLORIDE (POUR BTL) OPTIME
TOPICAL | Status: DC | PRN
Start: 2014-08-11 — End: 2014-08-11
  Administered 2014-08-11: 1000 mL

## 2014-08-11 MED ORDER — GLYCOPYRROLATE 0.2 MG/ML IJ SOLN
INTRAMUSCULAR | Status: AC
  Filled 2014-08-11: qty 2

## 2014-08-11 MED ORDER — CEFAZOLIN SODIUM-DEXTROSE 2-3 GM-% IV SOLR
INTRAVENOUS | Status: AC
  Filled 2014-08-11: qty 50

## 2014-08-11 MED ORDER — SODIUM CHLORIDE 0.9 % IV SOLN
INTRAVENOUS | Status: DC
  Administered 2014-08-11 – 2014-08-12 (×2): via INTRAVENOUS

## 2014-08-11 MED ORDER — AMIODARONE HCL 200 MG PO TABS
200.0000 mg | ORAL_TABLET | Freq: Every day | ORAL | Status: DC
  Administered 2014-08-12 – 2014-08-13 (×2): 200 mg via ORAL
  Filled 2014-08-11 (×2): qty 1

## 2014-08-11 MED ORDER — PROPOFOL 10 MG/ML IV BOLUS
INTRAVENOUS | Status: AC
  Filled 2014-08-11: qty 20

## 2014-08-11 MED ORDER — POLYETHYLENE GLYCOL 3350 17 G PO PACK: 17.0000 g | PACK | Freq: Every day | ORAL | Status: DC | PRN

## 2014-08-11 MED ORDER — LIDOCAINE HCL (CARDIAC) 20 MG/ML IV SOLN
INTRAVENOUS | Status: DC | PRN
  Administered 2014-08-11: 100 mg via INTRAVENOUS

## 2014-08-11 MED ORDER — GLYCOPYRROLATE 0.2 MG/ML IJ SOLN
INTRAMUSCULAR | Status: AC
  Filled 2014-08-11: qty 1

## 2014-08-11 MED ORDER — METHOCARBAMOL 500 MG PO TABS
500.0000 mg | ORAL_TABLET | Freq: Four times a day (QID) | ORAL | Status: DC | PRN
  Administered 2014-08-12 – 2014-08-13 (×2): 500 mg via ORAL
  Filled 2014-08-11 (×3): qty 1

## 2014-08-11 MED ORDER — DEXTROSE 5 % IV SOLN
10.0000 mg | INTRAVENOUS | Status: DC | PRN
  Administered 2014-08-11: 20 ug/min via INTRAVENOUS

## 2014-08-11 MED ORDER — FLUOXETINE HCL 20 MG PO CAPS
20.0000 mg | ORAL_CAPSULE | Freq: Every day | ORAL | Status: DC
  Administered 2014-08-12 – 2014-08-13 (×2): 20 mg via ORAL
  Filled 2014-08-11 (×2): qty 1

## 2014-08-11 MED ORDER — THROMBIN 5000 UNITS EX SOLR
OROMUCOSAL | Status: DC | PRN
  Administered 2014-08-11: 09:00:00 via TOPICAL

## 2014-08-11 MED ORDER — SODIUM CHLORIDE 0.9 % IV SOLN: 250.0000 mL | INTRAVENOUS | Status: DC

## 2014-08-11 SURGICAL SUPPLY — 67 items
ADH SKN CLS APL DERMABOND .7 (GAUZE/BANDAGES/DRESSINGS) ×1
ADH SKN CLS LQ APL DERMABOND (GAUZE/BANDAGES/DRESSINGS) ×1
BAG DECANTER FOR FLEXI CONT (MISCELLANEOUS) ×3 IMPLANT
BLADE CLIPPER SURG (BLADE) IMPLANT
BONE MATRIX OSTEOCEL PRO MED (Bone Implant) ×4 IMPLANT
BUR MATCHSTICK NEURO 3.0 LAGG (BURR) ×3 IMPLANT
CAGE COROENT LRG 10X9X28-12 (Cage) ×4 IMPLANT
CANISTER SUCT 3000ML PPV (MISCELLANEOUS) ×3 IMPLANT
CONT SPEC 4OZ CLIKSEAL STRL BL (MISCELLANEOUS) ×6 IMPLANT
COVER BACK TABLE 60X90IN (DRAPES) ×3 IMPLANT
DECANTER SPIKE VIAL GLASS SM (MISCELLANEOUS) ×3 IMPLANT
DERMABOND ADHESIVE PROPEN (GAUZE/BANDAGES/DRESSINGS) ×2
DERMABOND ADVANCED (GAUZE/BANDAGES/DRESSINGS) ×2
DERMABOND ADVANCED .7 DNX12 (GAUZE/BANDAGES/DRESSINGS) ×1 IMPLANT
DERMABOND ADVANCED .7 DNX6 (GAUZE/BANDAGES/DRESSINGS) IMPLANT
DRAPE C-ARM 42X72 X-RAY (DRAPES) ×6 IMPLANT
DRAPE LAPAROTOMY 100X72X124 (DRAPES) ×3 IMPLANT
DRAPE POUCH INSTRU U-SHP 10X18 (DRAPES) ×3 IMPLANT
DRAPE PROXIMA HALF (DRAPES) IMPLANT
DRSG OPSITE POSTOP 4X8 (GAUZE/BANDAGES/DRESSINGS) ×4 IMPLANT
DURAPREP 26ML APPLICATOR (WOUND CARE) ×3 IMPLANT
ELECT REM PT RETURN 9FT ADLT (ELECTROSURGICAL) ×3
ELECTRODE REM PT RTRN 9FT ADLT (ELECTROSURGICAL) ×1 IMPLANT
GAUZE SPONGE 4X4 12PLY STRL (GAUZE/BANDAGES/DRESSINGS) ×3 IMPLANT
GAUZE SPONGE 4X4 16PLY XRAY LF (GAUZE/BANDAGES/DRESSINGS) IMPLANT
GLOVE BIO SURGEON STRL SZ 6.5 (GLOVE) ×4 IMPLANT
GLOVE BIO SURGEONS STRL SZ 6.5 (GLOVE) ×4
GLOVE BIOGEL PI IND STRL 6.5 (GLOVE) IMPLANT
GLOVE BIOGEL PI IND STRL 8.5 (GLOVE) ×2 IMPLANT
GLOVE BIOGEL PI INDICATOR 6.5 (GLOVE) ×4
GLOVE BIOGEL PI INDICATOR 8.5 (GLOVE) ×4
GLOVE ECLIPSE 8.0 STRL XLNG CF (GLOVE) ×2 IMPLANT
GLOVE ECLIPSE 8.5 STRL (GLOVE) ×6 IMPLANT
GLOVE EXAM NITRILE LRG STRL (GLOVE) IMPLANT
GLOVE EXAM NITRILE MD LF STRL (GLOVE) IMPLANT
GLOVE EXAM NITRILE XL STR (GLOVE) IMPLANT
GLOVE EXAM NITRILE XS STR PU (GLOVE) IMPLANT
GOWN STRL REUS W/ TWL LRG LVL3 (GOWN DISPOSABLE) IMPLANT
GOWN STRL REUS W/ TWL XL LVL3 (GOWN DISPOSABLE) IMPLANT
GOWN STRL REUS W/TWL 2XL LVL3 (GOWN DISPOSABLE) ×6 IMPLANT
GOWN STRL REUS W/TWL LRG LVL3 (GOWN DISPOSABLE) ×6
GOWN STRL REUS W/TWL XL LVL3 (GOWN DISPOSABLE)
HEMOSTAT POWDER KIT SURGIFOAM (HEMOSTASIS) IMPLANT
KIT BASIN OR (CUSTOM PROCEDURE TRAY) ×3 IMPLANT
KIT ROOM TURNOVER OR (KITS) ×3 IMPLANT
MILL MEDIUM DISP (BLADE) ×2 IMPLANT
NEEDLE HYPO 22GX1.5 SAFETY (NEEDLE) ×3 IMPLANT
NS IRRIG 1000ML POUR BTL (IV SOLUTION) ×3 IMPLANT
PACK LAMINECTOMY NEURO (CUSTOM PROCEDURE TRAY) ×3 IMPLANT
PAD ARMBOARD 7.5X6 YLW CONV (MISCELLANEOUS) ×9 IMPLANT
PATTIES SURGICAL .5 X1 (DISPOSABLE) ×3 IMPLANT
ROD RELINE-O LORD 5.5X40 (Rod) ×4 IMPLANT
SCREW LOCK RELINE 5.5 TULIP (Screw) ×8 IMPLANT
SCREW RELINE-O POLY 6.5X45 (Screw) ×8 IMPLANT
SPONGE LAP 4X18 X RAY DECT (DISPOSABLE) IMPLANT
SPONGE SURGIFOAM ABS GEL 100 (HEMOSTASIS) ×3 IMPLANT
SUT VIC AB 1 CT1 18XBRD ANBCTR (SUTURE) ×1 IMPLANT
SUT VIC AB 1 CT1 8-18 (SUTURE) ×3
SUT VIC AB 2-0 CP2 18 (SUTURE) ×5 IMPLANT
SUT VIC AB 3-0 SH 8-18 (SUTURE) ×5 IMPLANT
SYR 20ML ECCENTRIC (SYRINGE) ×3 IMPLANT
SYR 3ML LL SCALE MARK (SYRINGE) ×12 IMPLANT
TOWEL OR 17X24 6PK STRL BLUE (TOWEL DISPOSABLE) ×3 IMPLANT
TOWEL OR 17X26 10 PK STRL BLUE (TOWEL DISPOSABLE) ×3 IMPLANT
TRAP SPECIMEN MUCOUS 40CC (MISCELLANEOUS) ×3 IMPLANT
TRAY FOLEY W/METER SILVER 14FR (SET/KITS/TRAYS/PACK) ×3 IMPLANT
WATER STERILE IRR 1000ML POUR (IV SOLUTION) ×3 IMPLANT

## 2014-08-11 NOTE — Transfer of Care (Signed)
Immediate Anesthesia Transfer of Care Note  Patient: Judy White  Procedure(s) Performed: Procedure(s) with comments: Lumbar four-five Posterior lumbar interbody fusion (N/A) - L4-5 Posterior lumbar interbody fusion  Patient Location: PACU  Anesthesia Type:General  Level of Consciousness: awake, alert  and oriented  Airway & Oxygen Therapy: Patient Spontanous Breathing and Patient connected to nasal cannula oxygen  Post-op Assessment: Report given to RN and Post -op Vital signs reviewed and stable  Post vital signs: Reviewed and stable  Last Vitals:  Filed Vitals:   08/11/14 0639  BP:   Pulse:   Temp: 36.6 C  Resp:     Complications: No apparent anesthesia complications

## 2014-08-11 NOTE — Anesthesia Postprocedure Evaluation (Signed)
Anesthesia Post Note  Patient: Judy White  Procedure(s) Performed: Procedure(s) (LRB): Lumbar four-five Posterior lumbar interbody fusion (N/A)  Anesthesia type: general  Patient location: PACU  Post pain: Pain level controlled  Post assessment: Patient's Cardiovascular Status Stable  Last Vitals:  Filed Vitals:   08/11/14 1300  BP: 100/51  Pulse: 55  Temp: 36.4 C  Resp: 12    Post vital signs: Reviewed and stable  Level of consciousness: sedated  Complications: Pt complained of burning eyes bilaterally. No vision change. Mild redness bilaterally.  Plan: Bilat tordol drops. If not better in am - consult with ophthalmologist. Discussed with Dr Ellene Route.

## 2014-08-11 NOTE — Progress Notes (Signed)
Pt admitted to 4N30 from PACU with L4-5 PLIF.  Honeycomb dressing CDI to back.  Pt sleepy but easy to arouse.  Per PACU RN she c/o pain to her eyes.  Rinsed with NS and torodol drops given.  Family at bedside.  Brace in room.  Bed alarm set, call bell within reach, and pt verbalizes understanding to call before attempting to get out of bed.  Will continue to monitor.

## 2014-08-11 NOTE — Anesthesia Preprocedure Evaluation (Addendum)
Anesthesia Evaluation  Patient identified by MRN, date of birth, ID band Patient awake    Reviewed: Allergy & Precautions, NPO status , Patient's Chart, lab work & pertinent test results  History of Anesthesia Complications Negative for: history of anesthetic complications  Airway Mallampati: II  TM Distance: >3 FB Neck ROM: Full    Dental  (+) Teeth Intact, Dental Advisory Given   Pulmonary COPDCurrent Smoker,  breath sounds clear to auscultation  Pulmonary exam normal       Cardiovascular hypertension, Pt. on medications Normal cardiovascular exam+ dysrhythmias Atrial Fibrillation Rhythm:Regular Rate:Bradycardia     Neuro/Psych PSYCHIATRIC DISORDERS Anxiety Depression    GI/Hepatic   Endo/Other    Renal/GU      Musculoskeletal   Abdominal   Peds  Hematology   Anesthesia Other Findings   Reproductive/Obstetrics                           Anesthesia Physical Anesthesia Plan  ASA: II  Anesthesia Plan: General   Post-op Pain Management:    Induction: Intravenous  Airway Management Planned: Oral ETT  Additional Equipment:   Intra-op Plan:   Post-operative Plan: Extubation in OR  Informed Consent:   Dental advisory given  Plan Discussed with: Surgeon and CRNA  Anesthesia Plan Comments:        Anesthesia Quick Evaluation

## 2014-08-11 NOTE — H&P (Signed)
Judy White is an 74 y.o. female.   Chief Complaint: Back and bilateral leg pain HPI: Patient is a 74 year old individual is had chronic back pain for a considerable length of time and motor function has been gradually deteriorating and she's been advised regarding surgical decompression and stabilization at L4-L5. She's now admitted for this procedure.  Past Medical History  Diagnosis Date  . Hypercholesteremia   . Osteopenia   . Depressive disorder, not elsewhere classified   . Anxiety   . Paroxysmal atrial fibrillation   . Myalgia and myositis, unspecified   . COPD (chronic obstructive pulmonary disease)   . Overactive bladder   . Dysrhythmia     af  . HTN (hypertension)     [name]    Past Surgical History  Procedure Laterality Date  . Tonsillectomy    . Appendectomy    . Cesarean section    . Wisdom tooth extraction    . Shoulder surgery    . Re-excision of breast lumpectomy    . Cardiac catheterization      >5 yrs  . Breast surgery      Family History  Problem Relation Age of Onset  . Cancer      maternal side  . Heart Problems      paternal side  . Aneurysm Father   . Hypotension Mother   . Neuropathy Brother   . Heart disease Brother    Social History:  reports that she has been smoking.  She does not have any smokeless tobacco history on file. She reports that she drinks alcohol. She reports that she does not use illicit drugs.  Allergies: No Known Allergies  Medications Prior to Admission  Medication Sig Dispense Refill  . amiodarone (PACERONE) 200 MG tablet Take 1.5 tablets (300 mg total) by mouth daily. (Patient taking differently: Take 200 mg by mouth daily. ) 45 tablet 5  . Ascorbic Acid (VITAMIN C PO) Take 500 mg by mouth daily.     Marland Kitchen aspirin 81 MG tablet Take 81 mg by mouth daily.    . Calcium-Vitamin D (CALTRATE 600 PLUS-VIT D PO) Take 1 tablet by mouth daily.     . cholecalciferol (VITAMIN D) 1000 UNITS tablet Take 1,000 Units by mouth  daily.    . clonazePAM (KLONOPIN) 1 MG tablet Take 1 tablet by mouth 2 (two) times daily.    . Cyanocobalamin (VITAMIN B-12 PO) Take 1 tablet by mouth daily.     Marland Kitchen diltiazem (CARDIZEM CD) 120 MG 24 hr capsule Take 1 capsule (120 mg total) by mouth daily. 30 capsule 11  . FLUoxetine (PROZAC) 20 MG capsule Take 20 mg by mouth daily.     . Omega-3 Fatty Acids (FISH OIL PO) Take 1 tablet by mouth daily.     Marland Kitchen oxybutynin (DITROPAN-XL) 10 MG 24 hr tablet Take 1 tablet by mouth daily.    . Potassium 75 MG TABS Take 1 tablet by mouth daily.      No results found for this or any previous visit (from the past 48 hour(s)). No results found.  Review of Systems  Constitutional: Negative.   HENT: Negative.   Eyes: Negative.   Respiratory: Negative.   Cardiovascular: Negative.   Gastrointestinal: Negative.   Genitourinary: Negative.   Musculoskeletal: Positive for back pain.  Skin: Negative.   Neurological: Positive for sensory change and focal weakness.  Endo/Heme/Allergies: Negative.   Psychiatric/Behavioral: Negative.     Blood pressure 118/54, pulse 46, temperature 97.8  F (36.6 C), temperature source Oral, resp. rate 20, weight 82.192 kg (181 lb 3.2 oz), SpO2 96 %. Physical Exam  Constitutional: She appears well-developed and well-nourished.  HENT:  Head: Normocephalic and atraumatic.  Eyes: Conjunctivae are normal.  Neck: Normal range of motion. Neck supple.  GI: Soft. Bowel sounds are normal.  Musculoskeletal:  Midline back pain bilateral straight leg lifting at 15 Patrick's maneuver is negative bilaterally  Neurological:  Motor strength intact in iliopsoas and quadriceps tibialis anterior week 4-5 bilaterally gastrocs strong and normal absent deep tendon reflexes and Achilles bilaterally other reflexes 1+ and symmetric  Skin: Skin is warm and dry.  Psychiatric: She has a normal mood and affect. Her behavior is normal. Judgment and thought content normal.    Teague Goynes  J 08/11/2014, 7:27 AM

## 2014-08-11 NOTE — Anesthesia Procedure Notes (Signed)
Procedure Name: Intubation Date/Time: 08/11/2014 7:39 AM Performed by: Susa Loffler Pre-anesthesia Checklist: Patient identified, Timeout performed, Emergency Drugs available, Suction available and Patient being monitored Patient Re-evaluated:Patient Re-evaluated prior to inductionOxygen Delivery Method: Circle system utilized Preoxygenation: Pre-oxygenation with 100% oxygen Intubation Type: IV induction Ventilation: Mask ventilation without difficulty Laryngoscope Size: Mac and 3 Grade View: Grade II Tube type: Oral Tube size: 7.0 mm Airway Equipment and Method: Stylet Placement Confirmation: ETT inserted through vocal cords under direct vision,  positive ETCO2 and breath sounds checked- equal and bilateral Secured at: 22 cm Tube secured with: Tape Dental Injury: Teeth and Oropharynx as per pre-operative assessment

## 2014-08-11 NOTE — Op Note (Signed)
Date of surgery: 08/11/2014 Preoperative diagnosis: Lumbar spondylolisthesis and stenosis with lumbar radiculopathy L4-L5 Postoperative diagnosis: Lumbar spondylolisthesis and stenosis with lumbar radiculopathy L4-L5 Procedure: Bilateral laminotomies and decompression of L4-L5 posterior lumbar interbody arthrodesis L4-L5 with peek spacers local autograft and allograft posterior lateral arthrodesis with local autograft and allograft pedicle screw fixation L4-L5 Surgeon: Kristeen Miss First assistant: Karie Chimera M.D. Anesthesia: Gen. endotracheal Indications: Patient is a 74 year old individual who has had significant back and bilateral lower extremity pain she has primarily right leg pain but has had left leg pain also she has advancing spondylolisthesis at L4-L5 with severe stenosis. She's been advised regarding surgical decompression and stabilization.  Procedure: Patient was brought to the operating room supine on a stretcher, after the smooth induction of general endotracheal anesthesia, she was turned prone and the back was prepped with alcohol and DuraPrep. A midline incision was created and carried down to the lumbar dorsal fascia fascia was opened on either side of midline to expose the spinous processes of 4 was identified as L4 and L5 positively on the radiograph. Interlaminar space was then cleared subperiosteally out to the facet joint and a transverse processes of L4 and L5 also exposed and decorticated and packed off for later use and fusion. Then laminotomies were created removing the inferior marginal lamina of L4 out to and including the entirety of the facet at L4-L5. Thick redundant yellow ligament was removed from this region and the common dural tube and the L4 nerve root superiorly was decompressed first on the left side than on the right side on the right side dura some severe hypertrophy of soft tissue from the facet joint itself causing severe compression of the L4 nerve root  superiorly and the L5 nerve root inferiorly. The dissection was then continued to isolate the disc space at the L4-L5 level on the left side similar pathology was found though not as severe. Hemostasis in the epidural spaces was then obtained with a bipolar cautery. The disc space was then entered and a complete discectomy was performed at L4-L5 this allowed for distraction of the interspace and ultimately a 10 mm tall 12 lordotic's trial spacer was placed to reduce the spondylolisthesis and also restart some lordosis in this region. To the spacers were packed with a combination of autograft and allograft in the form of osseous cell. The interspace was packed with 9 mL of osseous cell lung graft combination. Pedicle entry sites were then chosen at L4 and L5 and 6.5 x 45 mm screws were placed into the pedicles sequentially using fluoroscopic guidance. The holes were also sounded and no cutout was noted. A 40 mm precontoured rod was then placed between the screw heads and tightening neutral construct final radio grafts were obtained at this point lateral gutters were then packed with the additional bone that had been harvested after removing the sponges were placed in this region. Once the final radio grafts were obtained and was noted that construct was good in the patient's spine was straight with restore lordosis the nerve roots were checked to make sure that there were decompressed at L4 and L5 and then the lumbar dorsal fascia was reapproximated with #1 Vicodin interrupted fashion. 20 Vicryls used in the subcutaneous tissues, 30 Vicryls subcuticularly. Dermabond was placed on the skin. Blood loss for the procedure was estimated at some 400 mL. Some cell saver blood was returned to the patient.

## 2014-08-12 LAB — CBC
HEMATOCRIT: 37.6 % (ref 36.0–46.0)
Hemoglobin: 12.3 g/dL (ref 12.0–15.0)
MCH: 29.9 pg (ref 26.0–34.0)
MCHC: 32.7 g/dL (ref 30.0–36.0)
MCV: 91.5 fL (ref 78.0–100.0)
Platelets: 154 10*3/uL (ref 150–400)
RBC: 4.11 MIL/uL (ref 3.87–5.11)
RDW: 14.2 % (ref 11.5–15.5)
WBC: 18.7 10*3/uL — ABNORMAL HIGH (ref 4.0–10.5)

## 2014-08-12 LAB — BASIC METABOLIC PANEL
ANION GAP: 8 (ref 5–15)
BUN: 20 mg/dL (ref 6–20)
CO2: 26 mmol/L (ref 22–32)
Calcium: 8.2 mg/dL — ABNORMAL LOW (ref 8.9–10.3)
Chloride: 103 mmol/L (ref 101–111)
Creatinine, Ser: 1.05 mg/dL — ABNORMAL HIGH (ref 0.44–1.00)
GFR, EST AFRICAN AMERICAN: 58 mL/min — AB (ref >60)
GFR, EST NON AFRICAN AMERICAN: 50 mL/min — AB (ref >60)
Glucose, Bld: 156 mg/dL — ABNORMAL HIGH (ref 65–99)
Potassium: 4.7 mmol/L (ref 3.5–5.1)
Sodium: 137 mmol/L (ref 135–145)

## 2014-08-12 MED FILL — Sodium Chloride IV Soln 0.9%: INTRAVENOUS | Qty: 2000 | Status: AC

## 2014-08-12 MED FILL — Heparin Sodium (Porcine) Inj 1000 Unit/ML: INTRAMUSCULAR | Qty: 30 | Status: AC

## 2014-08-12 NOTE — Evaluation (Signed)
Physical Therapy Evaluation Patient Details Name: Judy White MRN: [id number] DOB: 1941/01/17 Today's Date: 08/12/2014   History of Present Illness  3 yp female s/p PLIF L4-5 with allograft posterior lateral arthrodesis with local autogradt and pedicle screw fixation. PMH: osteopenia, COPD, anxiety, overactive bladder, HTN, shoulder surg, breast lumpectomy, maalgia and myositisi, atrial fibrillation, depression  Clinical Impression  Patient demonstrates deficits in functional mobility as indicated below. Will need continued skilled PT to address deficits and maximize function. Will see as indicated and progress as tolerated. OF NOTE: Patient mobilizing well, will perform stair negotiation and car transfer simulation next session in preparation for dc home.    Follow Up Recommendations No PT follow up;Supervision for mobility/OOB    Equipment Recommendations   (patient states that she has a walker)    Recommendations for Other Services       Precautions / Restrictions Precautions Precautions: Back Precaution Booklet Issued: Yes (comment) Precaution Comments: pt able to verbalize 3/3 precautions Required Braces or Orthoses: Spinal Brace Spinal Brace: Lumbar corset Restrictions Weight Bearing Restrictions: No      Mobility  Bed Mobility Overal bed mobility: Needs Assistance Bed Mobility: Rolling;Sidelying to Sit Rolling: Min guard Sidelying to sit: Min guard       General bed mobility comments: VCs for technique, patient able to perform without physical assist  Transfers Overall transfer level: Needs assistance Equipment used: Rolling walker (2 wheeled) Transfers: Sit to/from Stand Sit to Stand: Min guard         General transfer comment: VC's for hand placement  Ambulation/Gait Ambulation/Gait assistance: Supervision;Min guard Ambulation Distance (Feet): 40 Feet Assistive device: Rolling walker (2 wheeled) Gait Pattern/deviations: Step-through pattern;Decreased  stride length Gait velocity: decreased Gait velocity interpretation: Below normal speed for age/gender General Gait Details: steady with ambulation, occasional cues for turns with back precautions  Stairs            Wheelchair Mobility    Modified Rankin (Stroke Patients Only)       Balance Overall balance assessment: Needs assistance Sitting-balance support: Feet supported       Standing balance support: Single extremity supported;During functional activity                                 Pertinent Vitals/Pain Pain Assessment: No/denies pain    Home Living Family/patient expects to be discharged to:: Private residence Living Arrangements: Spouse/significant other Available Help at Discharge: Family;Available 24 hours/day Type of Home: House Home Access: Stairs to enter   [employer] of Steps: 2 Home Layout: One level Home Equipment: Shower seat - built in;Walker - 4 wheels;Hand held shower head;Adaptive equipment      Prior Function Level of Independence: Independent               Hand Dominance   Dominant Hand: Right    Extremity/Trunk Assessment   Upper Extremity Assessment: Overall WFL for tasks assessed           Lower Extremity Assessment: Overall WFL for tasks assessed         Communication   Communication: No difficulties  Cognition Arousal/Alertness: Awake/alert Behavior During Therapy: WFL for tasks assessed/performed Overall Cognitive Status: Within Functional Limits for tasks assessed                      General Comments      Exercises        Assessment/Plan  PT Assessment Patient needs continued PT services  PT Diagnosis Difficulty walking;Acute pain   PT Problem List Decreased strength;Decreased activity tolerance;Decreased balance;Decreased mobility;Decreased knowledge of precautions;Pain  PT Treatment Interventions DME instruction;Gait training;Stair training;Functional  mobility training;Therapeutic activities;Therapeutic exercise;Balance training;Patient/family education   PT Goals (Current goals can be found in the Care Plan section) Acute Rehab PT Goals Patient Stated Goal: to go home PT Goal Formulation: With patient/family Time For Goal Achievement: 08/26/14 Potential to Achieve Goals: Good    Frequency Min 5X/week   Barriers to discharge        Co-evaluation               End of Session Equipment Utilized During Treatment: Gait belt;Back brace Activity Tolerance: No increased pain;Patient tolerated treatment well Patient left: in chair;with call bell/phone within reach;with family/visitor present Nurse Communication: Mobility status         Time: 0950-1007 PT Time Calculation (min) (ACUTE ONLY): 17 min   Charges:   PT Evaluation $Initial PT Evaluation Tier I: 1 Procedure     PT G CodesDuncan Dull 08-14-14, 11:53 AM Alben Deeds, PT DPT  [phone]

## 2014-08-12 NOTE — Clinical Social Work Note (Addendum)
CSW Consult Acknowledged:   CSW received a consult for SNF placement. CSW awaiting PT/OT evaluation to determine the appropriate level of care.      Addendum: CSW met with the pt and PT. Per PT evaluation SNF placement is not the appropriate level of care for the pt. CSW will sign off.    [place], MSW, [place]

## 2014-08-12 NOTE — Evaluation (Signed)
Occupational Therapy Evaluation Patient Details Name: Judy White MRN: [id number] DOB: 08/23/1940 Today's Date: 08/12/2014    History of Present Illness 42 yp female s/p PLIF L4-5 with allograft posterior lateral arthrodesis with local autogradt and pedicle screw fixation. PMH: osteopenia, COPD, anxiety, overactive bladder, HTN, shoulder surg, breast lumpectomy, maalgia and myositisi, atrial fibrillation, depression   Clinical Impression   Patient is s/p PLIF L4-5 surgery resulting in functional limitations due to the deficits listed below (see OT problem list). Pt able to verbalize 3/3 precautions. Education provided on maintaining precautions during ADLs, with emphasis on toileting and LB dressing. Pt demonstrated ability to complete ADLs with min guard assist and cuing to maintain precautions. Pt completed walk-in shower transfer with min guard assist. Pt has family available upon d/c to A with ADLs as needed. Patient will benefit from skilled OT acutely to increase independence and safety with ADLS to allow discharge home.     Follow Up Recommendations  No OT follow up    Equipment Recommendations  None recommended by OT    Recommendations for Other Services       Precautions / Restrictions Precautions Precautions: Back Precaution Booklet Issued: Yes (comment) Precaution Comments: pt able to verbalize 3/3 precautions Required Braces or Orthoses: Spinal Brace Spinal Brace: Lumbar corset Restrictions Weight Bearing Restrictions: No      Mobility Bed Mobility Overal bed mobility: Needs Assistance Bed Mobility: Rolling;Sidelying to Sit Rolling: Min guard Sidelying to sit: Min guard       General bed mobility comments: received in chair  Transfers Overall transfer level: Needs assistance Equipment used: Rolling walker (2 wheeled) Transfers: Sit to/from Stand Sit to Stand: Min guard         General transfer comment: VC's for hand placement    Balance Overall  balance assessment: Needs assistance Sitting-balance support: Feet supported       Standing balance support: Single extremity supported;During functional activity                                ADL Overall ADL's : Needs assistance/impaired Eating/Feeding: Independent;Sitting   Grooming: Wash/dry hands;Wash/dry face;Oral care;Standing;Min guard (cuing for back precautions)   Upper Body Bathing: Sitting;Minimal assitance   Lower Body Bathing: Minimal assistance;Sit to/from stand   Upper Body Dressing : Sitting;Minimal assistance (don brace)   Lower Body Dressing: Minimal assistance;Sit to/from stand   Toilet Transfer: Min guard;Ambulation;Regular Toilet;RW   Toileting- Clothing Manipulation and Hygiene: Supervision/safety;Cueing for back precautions;Sit to/from stand   Tub/ Shower Transfer: Walk-in shower;Min guard;Ambulation;Shower seat;Rolling walker   Functional mobility during ADLs: Min guard;Rolling walker General ADL Comments: Cuing to maintain precautions during functional activities     Vision Vision Assessment?: No apparent visual deficits   Perception     Praxis      Pertinent Vitals/Pain Pain Assessment: No/denies pain     Hand Dominance Right   Extremity/Trunk Assessment Upper Extremity Assessment Upper Extremity Assessment: Overall WFL for tasks assessed   Lower Extremity Assessment Lower Extremity Assessment: Overall WFL for tasks assessed       Communication Communication Communication: No difficulties   Cognition Arousal/Alertness: Awake/alert Behavior During Therapy: WFL for tasks assessed/performed Overall Cognitive Status: Within Functional Limits for tasks assessed                     General Comments       Exercises       Shoulder Instructions  Home Living Family/patient expects to be discharged to:: Private residence Living Arrangements: Spouse/significant other Available Help at Discharge:  Family;Available 24 hours/day Type of Home: House Home Access: Stairs to enter [employer] of Steps: 2   Home Layout: One level     Bathroom Shower/Tub: Walk-in Hydrologist: Handicapped height     Home Equipment: Shower seat - built in;Walker - 4 wheels;Hand held shower head;Adaptive equipment Adaptive Equipment: Reacher        Prior Functioning/Environment Level of Independence: Independent             OT Diagnosis: Generalized weakness   OT Problem List: Decreased knowledge of use of DME or AE;Decreased knowledge of precautions;Pain   OT Treatment/Interventions:      OT Goals(Current goals can be found in the care plan section) Acute Rehab OT Goals Patient Stated Goal: to go home OT Goal Formulation: With patient Time For Goal Achievement: 08/26/14 Potential to Achieve Goals: Good ADL Goals Pt Will Perform Upper Body Dressing: with supervision;sitting (don brace) Pt Will Perform Lower Body Dressing: with supervision;sit to/from stand;with adaptive equipment Pt Will Transfer to Toilet: with supervision;ambulating;regular height toilet  OT Frequency:     Barriers to D/C:            Co-evaluation              End of Session Equipment Utilized During Treatment: Gait belt;Rolling walker;Back brace Nurse Communication: Mobility status;Precautions  Activity Tolerance: Patient tolerated treatment well;No increased pain Patient left: in chair;with call bell/phone within reach;with chair alarm set;with family/visitor present   Time: 1100-1125 OT Time Calculation (min): 25 min Charges:  OT General Charges $OT Visit: 1 Procedure OT Evaluation $Initial OT Evaluation Tier I: 1 Procedure OT Treatments $Self Care/Home Management : 8-22 mins G-Codes:    Forest Gleason 08/12/2014, 12:12 PM

## 2014-08-12 NOTE — Progress Notes (Signed)
Patient ID: Judy White, female   DOB: 1940-11-22, 74 y.o.   MRN: [id number] Vital signs are stable. Her blood pressure is been a little on the low side but now is 93/48 She has been receiving IV fluid Motor function has been intact Leg pain has been essentially gone Back is somewhat sore Continue to mobilize Hep well IV

## 2014-08-13 MED ORDER — METHOCARBAMOL 500 MG PO TABS: 500.0000 mg | ORAL_TABLET | Freq: Four times a day (QID) | ORAL | Status: DC | PRN

## 2014-08-13 MED ORDER — HYDROCODONE-ACETAMINOPHEN 5-325 MG PO TABS: 1.0000 | ORAL_TABLET | ORAL | Status: DC | PRN

## 2014-08-13 NOTE — Progress Notes (Signed)
Physical Therapy Treatment Patient Details Name: Judy White MRN: [id number] DOB: 1940/08/26 Today's Date: 08/13/2014    History of Present Illness 78 yp female s/p PLIF L4-5 with allograft posterior lateral arthrodesis with local autogradt and pedicle screw fixation. PMH: osteopenia, COPD, anxiety, overactive bladder, HTN, shoulder surg, breast lumpectomy, maalgia and myositisi, atrial fibrillation, depression    PT Comments    Patient mobilizing well. Ambulated extended distance, performed stair negotiation, and simulated car transfer. Patient with 3/3 recall of precautions. Anticipate patient will be safe for d/c home with spouse. Encouraged mobility upon discharge.   Follow Up Recommendations  No PT follow up;Supervision for mobility/OOB     Equipment Recommendations   (patient states that she has a walker)    Recommendations for Other Services       Precautions / Restrictions Precautions Precautions: Back Precaution Booklet Issued: Yes (comment) Precaution Comments: pt able to verbalize 3/3 precautions Required Braces or Orthoses: Spinal Brace Spinal Brace: Lumbar corset Restrictions Weight Bearing Restrictions: No    Mobility  Bed Mobility Overal bed mobility: Needs Assistance Bed Mobility: Rolling;Sidelying to Sit Rolling: Supervision Sidelying to sit: Supervision       General bed mobility comments: Good technique, no physical assist required  Transfers Overall transfer level: Needs assistance Equipment used: Rolling walker (2 wheeled) Transfers: Sit to/from Stand Sit to Stand: Supervision         General transfer comment: Good technique, no physical assist required  Ambulation/Gait Ambulation/Gait assistance: Supervision Ambulation Distance (Feet): 260 Feet Assistive device: Rolling walker (2 wheeled) Gait Pattern/deviations: Step-through pattern;Decreased stride length Gait velocity: decreased Gait velocity interpretation: Below normal speed for  age/gender General Gait Details: good stability during increased ambulation, modest dizziness during activity    Stairs Stairs: Yes Stairs assistance: Supervision Stair Management: Forwards Number of Stairs: 5 General stair comments: Cues for step to technique  Wheelchair Mobility    Modified Rankin (Stroke Patients Only)       Balance                                    Cognition Arousal/Alertness: Awake/alert Behavior During Therapy: WFL for tasks assessed/performed Overall Cognitive Status: Within Functional Limits for tasks assessed                      Exercises      General Comments General comments (skin integrity, edema, etc.): patient able to don her brace without assist      Pertinent Vitals/Pain Pain Assessment: 0-10 Pain Score: 4  Pain Location: back Pain Descriptors / Indicators: Sore Pain Intervention(s): Monitored during session;Repositioned;Relaxation    Home Living                      Prior Function            PT Goals (current goals can now be found in the care plan section) Acute Rehab PT Goals Patient Stated Goal: to go home PT Goal Formulation: With patient/family Time For Goal Achievement: 08/26/14 Potential to Achieve Goals: Good Progress towards PT goals: Progressing toward goals    Frequency  Min 5X/week    PT Plan Current plan remains appropriate    Co-evaluation             End of Session Equipment Utilized During Treatment: Gait belt;Back brace Activity Tolerance: No increased pain;Patient tolerated treatment well Patient left: in chair;with  call bell/phone within reach;with family/visitor present     Time: 0828-0852 PT Time Calculation (min) (ACUTE ONLY): 24 min  Charges:  $Gait Training: 8-22 mins $Therapeutic Activity: 8-22 mins                    G CodesDuncan Dull 2014/08/27, 1:01 PM Alben Deeds, [place] DPT  [phone]

## 2014-08-13 NOTE — Discharge Summary (Signed)
Physician Discharge Summary  Patient ID: Judy White MRN: [id number] DOB/AGE: January 31, 1941 74 y.o.  Admit date: 08/11/2014 Discharge date: 08/13/2014  Admission Diagnoses: Spondylolisthesis and stenosis L4-L5 with radiculopathy  Discharge Diagnoses: Spondylolisthesis and stenosis L4-L5 with radiculopathy Active Problems:   Lumbar stenosis   Discharged Condition: good  Hospital Course: Patient was admitted to undergo surgical decompression at L4-L5. She tolerated surgery well. Her incision is clean and dry.  Consults: None  Significant Diagnostic Studies: None  Treatments: surgery: Decompression L4-L5 posterior lumbar interbody arthrodesis peek spacers local autograft allograft posterior lateral arthrodesis with local autograft and allograft L4-L5  Discharge Exam: Blood pressure 97/57, pulse 55, temperature 98.4 F (36.9 C), temperature source Oral, resp. rate 18, height 5\' 6"  (1.676 m), weight 82.192 kg (181 lb 3.2 oz), SpO2 94 %. Incision is clean and dry motor function is intact in lower extremities station and gait are normal  Disposition: 01-Home or Self Care  Discharge Instructions    Call MD for:  redness, tenderness, or signs of infection (pain, swelling, redness, odor or green/yellow discharge around incision site)    Complete by:  As directed      Call MD for:  severe uncontrolled pain    Complete by:  As directed      Call MD for:  temperature >100.4    Complete by:  As directed      Diet - low sodium heart healthy    Complete by:  As directed      Discharge instructions    Complete by:  As directed   Okay to shower. Do not apply salves or appointments to incision. No heavy lifting with the upper extremities greater than 15 pounds. May resume driving when not requiring pain medication and patient feels comfortable with doing so.     Increase activity slowly    Complete by:  As directed             Medication List    TAKE these medications        amiodarone  200 MG tablet  Commonly known as:  PACERONE  Take 1.5 tablets (300 mg total) by mouth daily.     aspirin 81 MG tablet  Take 81 mg by mouth daily.     CALTRATE 600 PLUS-VIT D PO  Take 1 tablet by mouth daily.     cholecalciferol 1000 UNITS tablet  Commonly known as:  VITAMIN D  Take 1,000 Units by mouth daily.     clonazePAM 1 MG tablet  Commonly known as:  KLONOPIN  Take 1 tablet by mouth 2 (two) times daily.     diltiazem 120 MG 24 hr capsule  Commonly known as:  CARDIZEM CD  Take 1 capsule (120 mg total) by mouth daily.     FISH OIL PO  Take 1 tablet by mouth daily.     FLUoxetine 20 MG capsule  Commonly known as:  PROZAC  Take 20 mg by mouth daily.     HYDROcodone-acetaminophen 5-325 MG per tablet  Commonly known as:  NORCO/VICODIN  Take 1-2 tablets by mouth every 4 (four) hours as needed for moderate pain.     methocarbamol 500 MG tablet  Commonly known as:  ROBAXIN  Take 1 tablet (500 mg total) by mouth every 6 (six) hours as needed for muscle spasms.     oxybutynin 10 MG 24 hr tablet  Commonly known as:  DITROPAN-XL  Take 1 tablet by mouth daily.     Potassium 75  MG Tabs  Take 1 tablet by mouth daily.     VITAMIN B-12 PO  Take 1 tablet by mouth daily.     VITAMIN C PO  Take 500 mg by mouth daily.         SignedEarleen Newport 08/13/2014, 5:26 PM

## 2014-08-13 NOTE — Progress Notes (Signed)
Patient ID: Judy White, female   DOB: Aug 26, 1940, 74 y.o.   MRN: [id number] Vital signs are stable Motor function is ok For discharge tomorrow.

## 2014-08-20 ENCOUNTER — Ambulatory Visit (HOSPITAL_COMMUNITY)
Admission: RE | Admit: 2014-08-20 | Discharge: 2014-08-20 | Disposition: A | Discharge status: Home / Self Care | Payer: Medicare Other | Source: Ambulatory Visit | Attending: Internal Medicine | Admitting: Internal Medicine

## 2014-08-20 ENCOUNTER — Other Ambulatory Visit (HOSPITAL_COMMUNITY): Payer: Self-pay | Admitting: Neurological Surgery

## 2014-08-20 DIAGNOSIS — M7989 Other specified soft tissue disorders: Secondary | ICD-10-CM | POA: Diagnosis not present

## 2014-08-20 DIAGNOSIS — M79604 Pain in right leg: Secondary | ICD-10-CM | POA: Insufficient documentation

## 2014-08-20 DIAGNOSIS — R609 Edema, unspecified: Secondary | ICD-10-CM

## 2014-08-20 DIAGNOSIS — M79605 Pain in left leg: Secondary | ICD-10-CM | POA: Diagnosis not present

## 2014-08-20 NOTE — Progress Notes (Signed)
Bilateral lower extremity venous duplex completed.  No evidence of DVT or superficial thrombosis.  There appears to be a Baker's cyst in the right popliteal fossa.

## 2014-08-21 DIAGNOSIS — R0602 Shortness of breath: Secondary | ICD-10-CM | POA: Diagnosis not present

## 2014-08-21 DIAGNOSIS — R600 Localized edema: Secondary | ICD-10-CM | POA: Diagnosis not present

## 2014-08-24 ENCOUNTER — Other Ambulatory Visit: Payer: Self-pay

## 2014-08-24 NOTE — Progress Notes (Signed)
Cardiology Office Note   Date:  08/25/2014   ID:  Judy White, DOB 12-08-1940, MRN [id number]  PCP:  Judy Heckle, MD  Cardiologist:  Judy Grooms, MD   Chief Complaint  Patient presents with  . Congestive Heart Failure      History of Present Illness: Judy White is a 74 y.o. female who presents for chronic atrial fibrillation, anticoagulation has cause intracranial bleeding, hypertension, and COPD.  She recently had lumbar surgery by Dr. Kristeen White. She was in the hospital for 2 days. When she got home she noted lower extremity swelling. She had a lower extremity duplex study on 08/20/2014 that was negative for evidence of DVT. She was noted to have orthopnea. She saw Dr. Briscoe White, M.D. at the [facility] walk-in clinic. A chest x-ray was done and it revealed volume overload. Furosemide 40 mg twice a day was started. Lower extremity swelling has improved but not reverted back to normal. Orthopnea has improved. She denies chest pain.  During the recent hospital stay for back surgery, she had low blood pressure and required IV fluid. Upon arriving home from the hospital she noted or extremity swelling and some mild dyspnea.    Past Medical History  Diagnosis Date  . Hypercholesteremia   . Osteopenia   . Depressive disorder, not elsewhere classified   . Anxiety   . Paroxysmal atrial fibrillation   . Myalgia and myositis, unspecified   . COPD (chronic obstructive pulmonary disease)   . Overactive bladder   . Dysrhythmia     af  . HTN (hypertension)     Judy White    Past Surgical History  Procedure Laterality Date  . Tonsillectomy    . Appendectomy    . Cesarean section    . Wisdom tooth extraction    . Shoulder surgery    . Re-excision of breast lumpectomy    . Cardiac catheterization      >5 yrs  . Breast surgery       Current Outpatient Prescriptions  Medication Sig Dispense Refill  . amiodarone (PACERONE) 200 MG tablet Take 200 mg by mouth daily.     . Ascorbic Acid (VITAMIN C PO) Take 500 mg by mouth daily.     Marland Kitchen aspirin 81 MG tablet Take 81 mg by mouth daily.    . Calcium-Vitamin D (CALTRATE 600 PLUS-VIT D PO) Take 1 tablet by mouth daily.     . cholecalciferol (VITAMIN D) 1000 UNITS tablet Take 1,000 Units by mouth daily.    . clonazePAM (KLONOPIN) 1 MG tablet Take 1 tablet by mouth 2 (two) times daily.    . Cyanocobalamin (VITAMIN B-12 PO) Take 1 tablet by mouth daily.     Marland Kitchen diltiazem (CARDIZEM CD) 120 MG 24 hr capsule Take 1 capsule (120 mg total) by mouth daily. 30 capsule 11  . FLUoxetine (PROZAC) 20 MG capsule Take 20 mg by mouth daily.     . furosemide (LASIX) 40 MG tablet Take 1 tablet by mouth daily.    Marland Kitchen HYDROcodone-acetaminophen (NORCO/VICODIN) 5-325 MG per tablet Take 1-2 tablets by mouth every 4 (four) hours as needed for moderate pain. 60 tablet 0  . methocarbamol (ROBAXIN) 500 MG tablet Take 1 tablet (500 mg total) by mouth every 6 (six) hours as needed for muscle spasms. 60 tablet 3  . Omega-3 Fatty Acids (FISH OIL PO) Take 1 tablet by mouth daily.     Marland Kitchen oxybutynin (DITROPAN-XL) 10 MG 24 hr tablet  Take 1 tablet by mouth daily.    . Potassium 75 MG TABS Take 1 tablet by mouth daily.     No current facility-administered medications for this visit.    Allergies:   Review of patient's allergies indicates no known allergies.    Social History:  The patient  reports that she has been smoking.  She does not have any smokeless tobacco history on file. She reports that she drinks alcohol. She reports that she does not use illicit drugs.   Family History:  The patient's family history includes Aneurysm in her father; Cancer in an other family member; Heart Problems in an other family member; Heart disease in her brother; Hypotension in her mother; Neuropathy in her brother.    ROS:  Please see the history of present illness.   Otherwise, review of systems are positive for orthopnea and soreness in her back. Wheezing. Cough..    All other systems are reviewed and negative.    PHYSICAL EXAM: VS:  BP 102/56 mmHg  Pulse 49  Ht 5' 6.5" (1.689 m)  Wt 85.004 kg (187 lb 6.4 oz)  BMI 29.80 kg/m2 , BMI Body mass index is 29.8 kg/(m^2). GEN: Well nourished, well developed, in no acute distress HEENT: normal Neck: no JVD, carotid bruits, or masses Cardiac: RRR; no murmurs, rubs, or gallops,no edema  Respiratory:  clear to auscultation bilaterally, normal work of breathing GI: soft, nontender, nondistended, + BS MS: no deformity or atrophy Skin: warm and dry, no rash Neuro:  Strength and sensation are intact Psych: euthymic mood, full affect   EKG:  EKG is ordered today. The ekg ordered today demonstrates sinus bradycardia at 49 bpm. Otherwise normal.   Recent Labs: 10/22/2013: ALT 17; TSH 1.51 08/12/2014: BUN 20; Creatinine, Ser 1.05*; Hemoglobin 12.3; Platelets 154; Potassium 4.7; Sodium 137    Lipid Panel No results found for: CHOL, TRIG, HDL, CHOLHDL, VLDL, LDLCALC, LDLDIRECT    Wt Readings from Last 3 Encounters:  08/25/14 85.004 kg (187 lb 6.4 oz)  08/11/14 82.192 kg (181 lb 3.2 oz)  08/07/14 82.192 kg (181 lb 3.2 oz)      Other studies Reviewed: Additional studies/ records that were reviewed today include: . Review of the above records demonstrates:    ASSESSMENT AND PLAN:  1. Bilateral lower extremity edema and dyspnea compatible with acute on chronic diastolic heart failure secondary to intravenous fluid administration during a recent hospital stay. Her weight is still up 7 pounds above her presurgical weight. Continue 40 mg of furosemide twice a day for an additional 5 days or until all the fluid has resolved as of it happens to be a shorter time frame. A basic metabolic panel and BNP will be obtained today.  2. Hypercholesteremia Noncontributory to today's evaluation  3. Essential hypertension Controlled  4. Paroxysmal atrial fibrillation Currently in normal sinus rhythm  5. Chronic  amiodarone therapy for maintenance of normal sinus rhythm    Current medicines are reviewed at length with the patient today.  The patient does not have concerns regarding medicines.  The following changes have been made:  Continue furosemide 40 mg twice a day for 5 days or until lower extremity edema resolves if that happens to be a shorter duration. Basic metabolic panel and bmet will be obtained today.  Labs/ tests ordered today include:   Orders Placed This Encounter  Procedures  . Basic metabolic panel  . B Nat Peptide  . EKG 12-Lead     Disposition:  FU with HS in 3 months  Signed, Judy Grooms, MD  08/25/2014 3:01 PM    [facility] [address], [place], [place]  [address] Phone: [phone]; Fax: [phone]

## 2014-08-25 ENCOUNTER — Ambulatory Visit (INDEPENDENT_AMBULATORY_CARE_PROVIDER_SITE_OTHER): Payer: Medicare Other | Admitting: Interventional Cardiology

## 2014-08-25 ENCOUNTER — Encounter: Payer: Self-pay | Admitting: Interventional Cardiology

## 2014-08-25 VITALS — BP 102/56 | HR 49 | Ht 66.5 in | Wt 187.4 lb

## 2014-08-25 DIAGNOSIS — Z79899 Other long term (current) drug therapy: Secondary | ICD-10-CM

## 2014-08-25 DIAGNOSIS — I480 Paroxysmal atrial fibrillation: Secondary | ICD-10-CM

## 2014-08-25 DIAGNOSIS — E7800 Pure hypercholesterolemia, unspecified: Secondary | ICD-10-CM

## 2014-08-25 DIAGNOSIS — I10 Essential (primary) hypertension: Secondary | ICD-10-CM | POA: Diagnosis not present

## 2014-08-25 DIAGNOSIS — I5031 Acute diastolic (congestive) heart failure: Secondary | ICD-10-CM | POA: Diagnosis not present

## 2014-08-25 DIAGNOSIS — I5032 Chronic diastolic (congestive) heart failure: Secondary | ICD-10-CM | POA: Insufficient documentation

## 2014-08-25 LAB — BASIC METABOLIC PANEL
BUN: 23 mg/dL (ref 6–23)
CO2: 32 meq/L (ref 19–32)
CREATININE: 1.23 mg/dL — AB (ref 0.40–1.20)
Calcium: 8.9 mg/dL (ref 8.4–10.5)
Chloride: 100 mEq/L (ref 96–112)
GFR: 45.1 mL/min — ABNORMAL LOW (ref >60.00)
Glucose, Bld: 103 mg/dL — ABNORMAL HIGH (ref 70–99)
Potassium: 3.8 mEq/L (ref 3.5–5.1)
Sodium: 139 mEq/L (ref 135–145)

## 2014-08-25 LAB — BRAIN NATRIURETIC PEPTIDE: Pro B Natriuretic peptide (BNP): 136 pg/mL — ABNORMAL HIGH (ref 0.0–100.0)

## 2014-08-25 NOTE — Patient Instructions (Signed)
Medication Instructions:  CONTINUE Lasix 40mg  twice daily for 5 days then STOP  Labwork: Bmet and Bnp Today  Testing/Procedures: None   Follow-Up: Your physician recommends that you schedule a follow-up appointment in: 3 months   Any Other Special Instructions Will Be Listed Below (If Applicable). Call the office in 1 week with an update of your swelling

## 2014-08-26 ENCOUNTER — Telehealth: Payer: Self-pay

## 2014-08-26 NOTE — Telephone Encounter (Signed)
-----   Message from Belva Crome, MD sent at 08/25/2014  6:18 PM EDT ----- Labs are okay. Het increased potassium supplements, ie raisins, oranges, lemons, etc.

## 2014-08-26 NOTE — Telephone Encounter (Signed)
Pt awe of lab results and Dr.Smith's recommendations. Labs are okay. She should  Increase potassium supplements in her diet, ie raisins, oranges, lemons, etc. Pt should call the office early next wk for an update on her swelling. Pt verbalized understanding.

## 2014-09-01 ENCOUNTER — Telehealth: Payer: Self-pay | Admitting: Interventional Cardiology

## 2014-09-01 DIAGNOSIS — Z79899 Other long term (current) drug therapy: Secondary | ICD-10-CM

## 2014-09-01 NOTE — Telephone Encounter (Signed)
Spoke with patient and she states her swelling is better but not all the way down Having Afib daily but episodes only last 10-15 minutes Patient does not weigh herself daily Discussed with Dr Tamala Julian and will have patient continue Lasix one daily and get bmet this week Advised patient, verbalized understanding  Patient will start weighing herself daily  Would like to speak with Dr Tamala Julian some time soon Will forward to Dr Tamala Julian

## 2014-09-01 NOTE — Telephone Encounter (Signed)
New message    Patient calling was told to call back Monday or Tuesday  If she still having swelling    Pt C/O swelling: STAT is pt has developed SOB within 24 hours  1. How long have you been experiencing swelling? S/p surgery on 6/14 .   2. Where is the swelling located? Feet - still swollen not nearly like it was   3.  Are you currently taking a "fluid pill"? Lasix for  5 days. Stop 7/2 .    4.  Are you currently SOB? Yes   5.  Have you traveled recently no

## 2014-09-02 ENCOUNTER — Other Ambulatory Visit (INDEPENDENT_AMBULATORY_CARE_PROVIDER_SITE_OTHER): Payer: Medicare Other | Admitting: *Deleted

## 2014-09-02 DIAGNOSIS — Z79899 Other long term (current) drug therapy: Secondary | ICD-10-CM | POA: Diagnosis not present

## 2014-09-02 LAB — BASIC METABOLIC PANEL
BUN: 20 mg/dL (ref 6–23)
CALCIUM: 8.9 mg/dL (ref 8.4–10.5)
CO2: 30 mEq/L (ref 19–32)
Chloride: 101 mEq/L (ref 96–112)
Creatinine, Ser: 1 mg/dL (ref 0.40–1.20)
GFR: 57.27 mL/min — AB (ref >60.00)
Glucose, Bld: 90 mg/dL (ref 70–99)
Potassium: 4.1 mEq/L (ref 3.5–5.1)
Sodium: 138 mEq/L (ref 135–145)

## 2014-09-02 NOTE — Addendum Note (Signed)
Addended by: Eulis Foster on: 09/02/2014 01:52 PM   Modules accepted: Orders

## 2014-09-08 ENCOUNTER — Other Ambulatory Visit: Payer: Self-pay

## 2014-09-08 ENCOUNTER — Telehealth: Payer: Self-pay | Admitting: Interventional Cardiology

## 2014-09-08 MED ORDER — FUROSEMIDE 40 MG PO TABS: 40.0000 mg | ORAL_TABLET | Freq: Every day | ORAL | Status: DC

## 2014-09-08 NOTE — Telephone Encounter (Signed)
Patient called in for lasix refill.

## 2014-09-08 NOTE — Telephone Encounter (Addendum)
error 

## 2014-09-09 DIAGNOSIS — M4316 Spondylolisthesis, lumbar region: Secondary | ICD-10-CM | POA: Diagnosis not present

## 2014-09-25 DIAGNOSIS — H52203 Unspecified astigmatism, bilateral: Secondary | ICD-10-CM | POA: Diagnosis not present

## 2014-09-25 DIAGNOSIS — Z961 Presence of intraocular lens: Secondary | ICD-10-CM | POA: Diagnosis not present

## 2014-09-30 DIAGNOSIS — L820 Inflamed seborrheic keratosis: Secondary | ICD-10-CM | POA: Diagnosis not present

## 2014-10-06 ENCOUNTER — Telehealth: Payer: Self-pay | Admitting: Interventional Cardiology

## 2014-10-06 DIAGNOSIS — M7989 Other specified soft tissue disorders: Secondary | ICD-10-CM

## 2014-10-06 NOTE — Telephone Encounter (Signed)
Pt aware of message below. Message fwd to [place] for further review

## 2014-10-06 NOTE — Telephone Encounter (Signed)
Needs bilateral lower extremity doppler study to r/o DVT

## 2014-10-06 NOTE — Telephone Encounter (Signed)
New message     Pt c/o swelling: STAT is pt has developed SOB within 24 hours  1. How long have you been experiencing swelling? Since June 14  2. Where is the swelling located? Feet and ankles  3.  Are you currently taking a "fluid pill"? Yes-----lasix  4.  Are you currently SOB?  no 5.  Have you traveled recently?  no Pt states that her feet are hot and swollen.  Please advise

## 2014-10-06 NOTE — Telephone Encounter (Signed)
Returned pt call. Pt sts that her feet have remained swollen, if with  The addition to lasix 40mg  daily in early July. Pt sts that she has been weighing regularly and her weights have been stable.pt denies chest pain or sob. Pt is concerned that her LE swelling has not resolved, her feet are red, and warm to the touch. She would like for someone to look at her feet. Adv pt I will fwd an update to Dr.Smith and call back with his recommendation. Pt agreeable and verbalized understanding.

## 2014-10-07 ENCOUNTER — Other Ambulatory Visit: Payer: Self-pay

## 2014-10-07 DIAGNOSIS — M7989 Other specified soft tissue disorders: Secondary | ICD-10-CM

## 2014-10-07 NOTE — Telephone Encounter (Signed)
Pt aware of Dr.Smith's recommendation Needs bilateral lower extremity doppler study to r/o DVT Adv pt that a scheduler from our office will call her to schedule her LE study @ our [facility] office. Pt appreciative for the call back and verbalized understanding.

## 2014-10-08 ENCOUNTER — Ambulatory Visit (HOSPITAL_COMMUNITY)
Admission: RE | Admit: 2014-10-08 | Discharge: 2014-10-08 | Disposition: A | Discharge status: Home / Self Care | Payer: Medicare Other | Source: Ambulatory Visit | Attending: Cardiology | Admitting: Cardiology

## 2014-10-08 DIAGNOSIS — M7989 Other specified soft tissue disorders: Secondary | ICD-10-CM | POA: Diagnosis not present

## 2014-10-08 NOTE — Telephone Encounter (Signed)
Pt aware LE venous dopp  Is scheduled for today @1 :30pm. Pt aware it will be performed @ our [facility] office. Pt verbalized understanding.

## 2014-10-09 ENCOUNTER — Telehealth: Payer: Self-pay

## 2014-10-09 NOTE — Telephone Encounter (Signed)
Pt aware of LE dopp.  No blood clots in legs.  Pt verbalized understanding. Pt will f/u with her pcp.

## 2014-10-09 NOTE — Telephone Encounter (Signed)
-----   Message from Belva Crome, MD sent at 10/09/2014 11:41 AM EDT ----- No blood clots in legs.

## 2014-10-30 DIAGNOSIS — I480 Paroxysmal atrial fibrillation: Secondary | ICD-10-CM | POA: Diagnosis not present

## 2014-10-30 DIAGNOSIS — L309 Dermatitis, unspecified: Secondary | ICD-10-CM | POA: Diagnosis not present

## 2014-10-30 DIAGNOSIS — L03115 Cellulitis of right lower limb: Secondary | ICD-10-CM | POA: Diagnosis not present

## 2014-10-30 DIAGNOSIS — Z79899 Other long term (current) drug therapy: Secondary | ICD-10-CM | POA: Diagnosis not present

## 2014-10-30 DIAGNOSIS — I10 Essential (primary) hypertension: Secondary | ICD-10-CM | POA: Diagnosis not present

## 2014-10-30 DIAGNOSIS — E782 Mixed hyperlipidemia: Secondary | ICD-10-CM | POA: Diagnosis not present

## 2014-11-12 ENCOUNTER — Other Ambulatory Visit: Payer: Self-pay | Admitting: Interventional Cardiology

## 2014-11-13 DIAGNOSIS — L03115 Cellulitis of right lower limb: Secondary | ICD-10-CM | POA: Diagnosis not present

## 2014-11-18 DIAGNOSIS — Z23 Encounter for immunization: Secondary | ICD-10-CM | POA: Diagnosis not present

## 2014-11-27 ENCOUNTER — Ambulatory Visit (INDEPENDENT_AMBULATORY_CARE_PROVIDER_SITE_OTHER): Payer: Medicare Other | Admitting: Interventional Cardiology

## 2014-11-27 ENCOUNTER — Encounter: Payer: Self-pay | Admitting: Interventional Cardiology

## 2014-11-27 VITALS — BP 130/66 | HR 47 | Ht 66.0 in | Wt 178.1 lb

## 2014-11-27 DIAGNOSIS — J441 Chronic obstructive pulmonary disease with (acute) exacerbation: Secondary | ICD-10-CM

## 2014-11-27 DIAGNOSIS — Z79899 Other long term (current) drug therapy: Secondary | ICD-10-CM | POA: Diagnosis not present

## 2014-11-27 DIAGNOSIS — I480 Paroxysmal atrial fibrillation: Secondary | ICD-10-CM | POA: Diagnosis not present

## 2014-11-27 DIAGNOSIS — I5032 Chronic diastolic (congestive) heart failure: Secondary | ICD-10-CM | POA: Diagnosis not present

## 2014-11-27 DIAGNOSIS — I10 Essential (primary) hypertension: Secondary | ICD-10-CM

## 2014-11-27 MED ORDER — FUROSEMIDE 20 MG PO TABS: 20.0000 mg | ORAL_TABLET | Freq: Every day | ORAL | Status: DC

## 2014-11-27 NOTE — Patient Instructions (Signed)
Medication Instructions:  Your physician recommends that you continue on your current medications as directed. Please refer to the Current Medication list given to you today.   Labwork: Your physician recommends that you return for lab work in: Tsh, Cmet   Testing/Procedures: None ordered  Follow-Up: Your physician wants you to follow-up in: 6 months with Dr.Smith You will receive a reminder letter in the mail two months in advance. If you don't receive a letter, please call our office to schedule the follow-up appointment.   Any Other Special Instructions Will Be Listed Below (If Applicable).

## 2014-11-27 NOTE — Progress Notes (Signed)
Cardiology Office Note   Date:  11/27/2014   ID:  Judy White, DOB May 08, 1940, MRN [id number]  PCP:  Henrine Screws, MD  Cardiologist:  Judy Grooms, MD   Chief Complaint  Patient presents with  . Atrial Fibrillation      History of Present Illness: Judy White is a 74 y.o. female who presents for paroxysmal atrial fibrillation, amiodarone therapy, diastolic heart failure, unable to use anticoagulation due to intracranial hemorrhage, essential hypertension,and anxiety disorder.  She is unable to use anticoagulation therapy. Atrial fibrillation is being controlled with low-dose amiodarone. She is on aspirin as stroke prophylaxis. Diuretic dose is been decreased by Dr. Inda Merlin. She is tolerating amiodarone without complications. She has not had syncope or other cardiac complaints.  Past Medical History  Diagnosis Date  . Hypercholesteremia   . Osteopenia   . Depressive disorder, not elsewhere classified   . Anxiety   . Paroxysmal atrial fibrillation   . Myalgia and myositis, unspecified   . COPD (chronic obstructive pulmonary disease)   . Overactive bladder   . Dysrhythmia     af  . HTN (hypertension)     Judy White    Past Surgical History  Procedure Laterality Date  . Tonsillectomy    . Appendectomy    . Cesarean section    . Wisdom tooth extraction    . Shoulder surgery    . Re-excision of breast lumpectomy    . Cardiac catheterization      >5 yrs  . Breast surgery       Current Outpatient Prescriptions  Medication Sig Dispense Refill  . amiodarone (PACERONE) 200 MG tablet Take 1 tablet (200 mg total) by mouth daily. 30 tablet 0  . Ascorbic Acid (VITAMIN C PO) Take 500 mg by mouth daily.     Marland Kitchen aspirin 81 MG tablet Take 81 mg by mouth daily.    . Calcium-Vitamin D (CALTRATE 600 PLUS-VIT D PO) Take 1 tablet by mouth daily.     . cholecalciferol (VITAMIN D) 1000 UNITS tablet Take 1,000 Units by mouth daily.    . clonazePAM (KLONOPIN) 1 MG tablet Take  1 tablet by mouth 2 (two) times daily.    . Cyanocobalamin (VITAMIN B-12 PO) Take 1 tablet by mouth daily.     Marland Kitchen diltiazem (CARDIZEM CD) 120 MG 24 hr capsule Take 1 capsule (120 mg total) by mouth daily. 30 capsule 11  . FLUoxetine (PROZAC) 20 MG capsule Take 20 mg by mouth daily.     . furosemide (LASIX) 40 MG tablet Take 20 mg by mouth daily.    Marland Kitchen HYDROcodone-acetaminophen (NORCO/VICODIN) 5-325 MG per tablet Take 1-2 tablets by mouth every 4 (four) hours as needed for moderate pain. 60 tablet 0  . methocarbamol (ROBAXIN) 500 MG tablet Take 1 tablet (500 mg total) by mouth every 6 (six) hours as needed for muscle spasms. 60 tablet 3  . Omega-3 Fatty Acids (FISH OIL PO) Take 1 tablet by mouth daily.     Marland Kitchen oxybutynin (DITROPAN-XL) 10 MG 24 hr tablet Take 1 tablet by mouth daily.    . Potassium 75 MG TABS Take 1 tablet by mouth daily.     No current facility-administered medications for this visit.    Allergies:   Review of patient's allergies indicates no known allergies.    Social History:  The patient  reports that she has been smoking.  She has never used smokeless tobacco. She reports that she drinks alcohol.  She reports that she does not use illicit drugs.   Family History:  The patient's family history includes Aneurysm in her father; Cancer in an other family member; Heart Problems in an other family member; Heart disease in her brother; Hypotension in her mother; Neuropathy in her brother.    ROS:  Please see the history of present illness.   Otherwise, review of systems are positive for abdominal discomfort, diarrhea, rash, resolving lower extremity swelling, and occasional palpitations..   All other systems are reviewed and negative.    PHYSICAL EXAM: VS:  BP 130/66 mmHg  Pulse 47  Ht 5\' 6"  (1.676 m)  Wt 80.795 kg (178 lb 1.9 oz)  BMI 28.76 kg/m2  SpO2 96% , BMI Body mass index is 28.76 kg/(m^2). GEN: Well nourished, well developed, in no acute distress HEENT: normal Neck:  no JVD, carotid bruits, or masses Cardiac: RRR.  There is no murmur, rub, or gallop. There is no edema. Respiratory:  clear to auscultation bilaterally, normal work of breathing. GI: soft, nontender, nondistended, + BS MS: no deformity or atrophy Skin: warm and dry, no rash Neuro:  Strength and sensation are intact Psych: euthymic mood, full affect   EKG:  EKG is not  ordered today.    Recent Labs: 08/12/2014: Hemoglobin 12.3; Platelets 154 08/25/2014: Pro B Natriuretic peptide (BNP) 136.0* 09/02/2014: BUN 20; Creatinine, Ser 1.00; Potassium 4.1; Sodium 138    Lipid Panel No results found for: CHOL, TRIG, HDL, CHOLHDL, VLDL, LDLCALC, LDLDIRECT    Wt Readings from Last 3 Encounters:  11/27/14 80.795 kg (178 lb 1.9 oz)  08/25/14 85.004 kg (187 lb 6.4 oz)  08/11/14 82.192 kg (181 lb 3.2 oz)      Other studies Reviewed: Additional studies/ records that were reviewed today include: reviewed most recent records. Don't have fetal records available.. The findings include anabiotic regimen and to significant decrease in lower extremity swelling and decrease in warmth and erythema in the left lower extremity..    ASSESSMENT AND PLAN:  1. Paroxysmal atrial fibrillation Rhythm control with amiodarone  2. On amiodarone therapy No side effects or symptoms at this timearea Will check laboratory work on next office visit   3. Chronic obstructive pulmonary disease with acute exacerbation No change  4. Chronic diastolic heart failure Acute decompensation has resolved with control of A. Fib. No evidence of volume overload.  5. Essential hypertension controlled    Current medicines are reviewed at length with the patient today.  The patient has the following concerns regarding medicines: we will write a new prescription for furosemide and make it 20 mg tablets and she is stable on that dose..  The following changes/actions have been instituted:    Furosemide 20 mg tablets one  daily  TSH and hepatic panel along with electrolytes on next office visit in 6 months  Labs/ tests ordered today include:  No orders of the defined types were placed in this encounter.     Disposition:   FU with HS in 6 months  Signed, Judy Grooms, MD     11/27/2014 12:23 PM    [facility], [place], [place]  [address] Phone: [phone]; Fax: [phone]

## 2014-11-29 DIAGNOSIS — R112 Nausea with vomiting, unspecified: Secondary | ICD-10-CM | POA: Diagnosis not present

## 2014-11-29 DIAGNOSIS — R197 Diarrhea, unspecified: Secondary | ICD-10-CM | POA: Diagnosis not present

## 2014-11-30 DIAGNOSIS — R197 Diarrhea, unspecified: Secondary | ICD-10-CM | POA: Diagnosis not present

## 2014-12-14 ENCOUNTER — Telehealth: Payer: Self-pay | Admitting: Nurse Practitioner

## 2014-12-14 NOTE — Telephone Encounter (Signed)
Called patient to inform her that [employer] was now available.  She is still very interested in discussion.  Appt with Dr Rayann Heman 12-16-14 at 1:45 to discuss further.  Pt aware and agrees with plan.  [name], NP 12/14/2014 3:38 PM

## 2014-12-15 ENCOUNTER — Other Ambulatory Visit: Payer: Self-pay | Admitting: Interventional Cardiology

## 2014-12-16 ENCOUNTER — Ambulatory Visit (INDEPENDENT_AMBULATORY_CARE_PROVIDER_SITE_OTHER): Payer: Medicare Other | Admitting: Internal Medicine

## 2014-12-16 ENCOUNTER — Encounter: Payer: Self-pay | Admitting: Internal Medicine

## 2014-12-16 ENCOUNTER — Other Ambulatory Visit: Payer: Self-pay

## 2014-12-16 VITALS — BP 112/64 | HR 54 | Ht 66.0 in | Wt 178.8 lb

## 2014-12-16 DIAGNOSIS — I480 Paroxysmal atrial fibrillation: Secondary | ICD-10-CM

## 2014-12-16 DIAGNOSIS — I10 Essential (primary) hypertension: Secondary | ICD-10-CM | POA: Diagnosis not present

## 2014-12-16 NOTE — Patient Instructions (Signed)
Medication Instructions:  Your physician recommends that you continue on your current medications as directed. Please refer to the Current Medication list given to you today.   Labwork: Your physician recommends that you return for lab work at hospital on 12/22/14   Testing/Procedures: Your physician has requested that you have a TEE. During a TEE, sound waves are used to create images of your heart. It provides your doctor with information about the size and shape of your heart and how well your heart's chambers and valves are working. In this test, a transducer is attached to the end of a flexible tube that's guided down your throat and into your esophagus (the tube leading from you mouth to your stomach) to get a more detailed image of your heart. You are not awake for the procedure. Please see the instruction sheet given to you today. For further information please visit [email or web address] with Dr Debara Pickett.  Please check in at the [facility] at 9:30am  Nothing to eat or drink after midnight.  Okay to take medications the morning of TEE with small sip of water  On 12/31/14 will need to be at hospital at 6:00am.  Nothing to eat or drink after midnight  Do not take any medications the morning of your proceudre   Follow-Up:  Your physician recommends that you schedule a follow-up appointment in: 7-10 days from 12/31/14 with [name], NP or Dr Rayann Heman   Any Other Special Instructions Will Be Listed Below (If Applicable).

## 2014-12-17 NOTE — Progress Notes (Signed)
Electrophysiology Office Note   Date:  12/17/2014   ID:  Judy White, DOB 04-12-1940, MRN [id number]  PCP:  Henrine Screws, MD  Cardiologist:  Dr Tamala Julian Primary Electrophysiologist: Thompson Grayer, MD    Chief Complaint  Patient presents with  . PAF     History of Present Illness: Judy White is a 74 y.o. female who presents today for electrophysiology follow-up.   She has done well since I last saw her. SHe continues to have occasional episodes of afib.  She remains afraid to try anticoagulation long term given prior ICH.  She is very intersted in left atrial appendage occlusion and does feel that she could take anticoagulation for the required duration. Today, she denies symptoms of palpitations, chest pain, shortness of breath, orthopnea, PND, lower extremity edema, claudication, dizziness, presyncope, syncope, bleeding, or neurologic sequela. The patient is tolerating medications without difficulties and is otherwise without complaint today.    Past Medical History  Diagnosis Date  . Hypercholesteremia   . Osteopenia   . Depressive disorder, not elsewhere classified   . Anxiety   . Paroxysmal atrial fibrillation (HCC)   . Myalgia and myositis, unspecified   . COPD (chronic obstructive pulmonary disease) ([place])   . Overactive bladder   . Dysrhythmia     af  . HTN (hypertension)     [name]   Past Surgical History  Procedure Laterality Date  . Tonsillectomy    . Appendectomy    . Cesarean section    . Wisdom tooth extraction    . Shoulder surgery    . Re-excision of breast lumpectomy    . Cardiac catheterization      >5 yrs  . Breast surgery       Current Outpatient Prescriptions  Medication Sig Dispense Refill  . amiodarone (PACERONE) 200 MG tablet Take 200 mg by mouth daily.    . Ascorbic Acid (VITAMIN C PO) Take 500 mg by mouth daily.     Marland Kitchen aspirin 81 MG tablet Take 81 mg by mouth daily.    . Calcium-Vitamin D (CALTRATE 600 PLUS-VIT D PO) Take 1  tablet by mouth daily.     . chlorhexidine (PERIDEX) 0.12 % solution as directed.    . cholecalciferol (VITAMIN D) 1000 UNITS tablet Take 1,000 Units by mouth daily.    . clonazePAM (KLONOPIN) 1 MG tablet Take 1 tablet by mouth 2 (two) times daily.    . Cyanocobalamin (VITAMIN B-12 PO) Take 1 tablet by mouth daily.     Marland Kitchen diltiazem (CARDIZEM CD) 120 MG 24 hr capsule Take 1 capsule (120 mg total) by mouth daily. 30 capsule 11  . FLUoxetine (PROZAC) 20 MG capsule Take 20 mg by mouth daily.     . furosemide (LASIX) 20 MG tablet Take 1 tablet (20 mg total) by mouth daily. 90 tablet 3  . methocarbamol (ROBAXIN) 500 MG tablet Take 1 tablet (500 mg total) by mouth every 6 (six) hours as needed for muscle spasms. 60 tablet 3  . Omega-3 Fatty Acids (FISH OIL PO) Take 1 tablet by mouth daily.     Marland Kitchen oxybutynin (DITROPAN-XL) 10 MG 24 hr tablet Take 1 tablet by mouth daily.    . Potassium 75 MG TABS Take 1 tablet by mouth daily.     No current facility-administered medications for this visit.    Allergies:   Review of patient's allergies indicates no known allergies.   Social History:  The patient  reports that she  has been smoking.  She has never used smokeless tobacco. She reports that she drinks alcohol. She reports that she does not use illicit drugs.   Family History:  The patient's  family history includes Aneurysm in her father; Cancer in an other family member; Heart Problems in an other family member; Heart disease in her brother; Hypotension in her mother; Neuropathy in her brother.    ROS:  Please see the history of present illness.   All other systems are reviewed and negative.    PHYSICAL EXAM: VS:  BP 112/64 mmHg  Pulse 54  Ht 5\' 6"  (1.676 m)  Wt 178 lb 12.8 oz (81.103 kg)  BMI 28.87 kg/m2 , BMI Body mass index is 28.87 kg/(m^2). GEN: Well nourished, well developed, in no acute distress HEENT: normal Neck: no JVD, carotid bruits, or masses Cardiac: RRR; no murmurs, rubs, or  gallops,no edema  Respiratory:  clear to auscultation bilaterally, normal work of breathing GI: soft, nontender, nondistended, + BS MS: no deformity or atrophy Skin: warm and dry  Neuro:  Strength and sensation are intact Psych: euthymic mood, full affect  EKG:  EKG is ordered today. The ekg ordered today shows sinus rhythm   Recent Labs: 08/12/2014: Hemoglobin 12.3; Platelets 154 08/25/2014: Pro B Natriuretic peptide (BNP) 136.0* 09/02/2014: BUN 20; Creatinine, Ser 1.00; Potassium 4.1; Sodium 138    Lipid Panel  No results found for: CHOL, TRIG, HDL, CHOLHDL, VLDL, LDLCALC, LDLDIRECT   Wt Readings from Last 3 Encounters:  12/16/14 178 lb 12.8 oz (81.103 kg)  11/27/14 178 lb 1.9 oz (80.795 kg)  08/25/14 187 lb 6.4 oz (85.004 kg)      Other studies Reviewed: Additional studies/ records that were reviewed today include: Dr Darliss Ridgel recent note   ASSESSMENT AND PLAN:   1. Paroxysmal atrial fibrillation stable Her chads2vasc score is at least 4.  She is not anticoagulated due to prior ICH on coumadin.  The patients chart has been reviewed and I feel that they would be a candidate for short term oral anticoagulation.  Procedural risks for the Watchman implant have been reviewed with the patient including a 1% risk of stroke, 2% risk of perforation, 0.1% risk of device embolization.  Given the patient's poor candidacy for long-term oral anticoagulation, ability to tolerate short term oral anticoagulation, I have recommended the watchman left atrial appendage closure system.  TEE will be scheduled to review LAA anatomy.  The patient understands that the ability to implant Watchman is dependent on results of the TEE.  If patient is candidate for Watchman based on TEE results, we will schedule the procedure at the next available time.   2. HTN Stable No change required today  Today, I have spent 40 minutes with the patient discussing anticoagulation/ watchman implant.  More than 50% of  the visit time today was spent on this issue.    Current medicines are reviewed at length with the patient today.   The patient does not have concerns regarding her medicines.  The following changes were made today:  none  Signed, Thompson Grayer, MD  12/17/2014 9:19 PM     [address] [place] [address] [phone] (office) [phone] (fax)

## 2014-12-18 DIAGNOSIS — Z1231 Encounter for screening mammogram for malignant neoplasm of breast: Secondary | ICD-10-CM | POA: Diagnosis not present

## 2014-12-18 NOTE — Progress Notes (Signed)
I'm okay with proceeding with Watchman but the 6-8 week anticoagulation may be problematic.

## 2014-12-22 ENCOUNTER — Encounter (HOSPITAL_COMMUNITY)
Admission: RE | Disposition: A | Discharge status: Home / Self Care | Payer: Self-pay | Source: Ambulatory Visit | Attending: Internal Medicine

## 2014-12-22 ENCOUNTER — Encounter (HOSPITAL_COMMUNITY): Payer: Self-pay

## 2014-12-22 ENCOUNTER — Ambulatory Visit (HOSPITAL_COMMUNITY)
Admission: RE | Admit: 2014-12-22 | Discharge: 2014-12-22 | Disposition: A | Discharge status: Home / Self Care | Payer: Medicare Other | Source: Ambulatory Visit | Attending: Internal Medicine | Admitting: Internal Medicine

## 2014-12-22 ENCOUNTER — Ambulatory Visit (HOSPITAL_BASED_OUTPATIENT_CLINIC_OR_DEPARTMENT_OTHER)
Admission: RE | Admit: 2014-12-22 | Discharge: 2014-12-22 | Disposition: A | Discharge status: Home / Self Care | Payer: Medicare Other | Source: Ambulatory Visit | Attending: Internal Medicine | Admitting: Internal Medicine

## 2014-12-22 DIAGNOSIS — I4892 Unspecified atrial flutter: Secondary | ICD-10-CM | POA: Insufficient documentation

## 2014-12-22 DIAGNOSIS — I340 Nonrheumatic mitral (valve) insufficiency: Secondary | ICD-10-CM

## 2014-12-22 DIAGNOSIS — I483 Typical atrial flutter: Secondary | ICD-10-CM | POA: Insufficient documentation

## 2014-12-22 DIAGNOSIS — I4891 Unspecified atrial fibrillation: Secondary | ICD-10-CM | POA: Diagnosis not present

## 2014-12-22 DIAGNOSIS — I480 Paroxysmal atrial fibrillation: Secondary | ICD-10-CM | POA: Insufficient documentation

## 2014-12-22 HISTORY — PX: TEE WITHOUT CARDIOVERSION: SHX5443

## 2014-12-22 SURGERY — ECHOCARDIOGRAM, TRANSESOPHAGEAL
Anesthesia: Moderate Sedation

## 2014-12-22 MED ORDER — MIDAZOLAM HCL 5 MG/ML IJ SOLN
INTRAMUSCULAR | Status: AC
  Filled 2014-12-22: qty 2

## 2014-12-22 MED ORDER — LIDOCAINE VISCOUS 2 % MT SOLN
OROMUCOSAL | Status: DC | PRN
  Administered 2014-12-22: 20 mL via OROMUCOSAL

## 2014-12-22 MED ORDER — MIDAZOLAM HCL 10 MG/2ML IJ SOLN
INTRAMUSCULAR | Status: DC | PRN
  Administered 2014-12-22: 1 mg via INTRAVENOUS
  Administered 2014-12-22: 2 mg via INTRAVENOUS
  Administered 2014-12-22 (×2): 1 mg via INTRAVENOUS

## 2014-12-22 MED ORDER — LIDOCAINE VISCOUS 2 % MT SOLN
OROMUCOSAL | Status: AC
  Filled 2014-12-22: qty 15

## 2014-12-22 MED ORDER — FENTANYL CITRATE (PF) 100 MCG/2ML IJ SOLN
INTRAMUSCULAR | Status: DC | PRN
  Administered 2014-12-22 (×2): 25 ug via INTRAVENOUS

## 2014-12-22 MED ORDER — DIPHENHYDRAMINE HCL 50 MG/ML IJ SOLN
INTRAMUSCULAR | Status: AC
  Filled 2014-12-22: qty 1

## 2014-12-22 MED ORDER — BUTAMBEN-TETRACAINE-BENZOCAINE 2-2-14 % EX AERO
INHALATION_SPRAY | CUTANEOUS | Status: DC | PRN
  Administered 2014-12-22: 2 via TOPICAL

## 2014-12-22 MED ORDER — SODIUM CHLORIDE 0.9 % IV SOLN
INTRAVENOUS | Status: DC
  Administered 2014-12-22: 10:00:00 via INTRAVENOUS

## 2014-12-22 MED ORDER — FENTANYL CITRATE (PF) 100 MCG/2ML IJ SOLN
INTRAMUSCULAR | Status: AC
  Filled 2014-12-22: qty 2

## 2014-12-22 NOTE — CV Procedure (Signed)
Marland Kitchen   TRANSESOPHAGEAL ECHOCARDIOGRAM (TEE) NOTE  - WATCHMAN ATRIAL APPENDAGE OCCLUDER DEVICE EVALUATION    INDICATIONS: atrial fibrillation, atrial flutter and bradyarrythmia  PROCEDURE:   Informed consent was obtained prior to the procedure. The risks, benefits and alternatives for the procedure were discussed and the patient comprehended these risks.  Risks include, but are not limited to, cough, sore throat, vomiting, nausea, somnolence, esophageal and stomach trauma or perforation, bleeding, low blood pressure, aspiration, pneumonia, infection, trauma to the teeth and death.    After a procedural time-out, the patient was given 5 mg versed and 50 mcg fentanyl for moderate sedation.  The oropharynx was anesthetized 10 cc of topical 1% viscous lidocaine and 2 cetacaine sprays. The transesophageal probe was inserted in the esophagus and stomach without difficulty and multiple views were obtained.  The patient was kept under observation until the patient left the procedure room.  The patient left the procedure room in stable condition.   Agitated microbubble saline contrast was administered.  COMPLICATIONS:    There were no immediate complications. The patient was noted to go in and out of sinus bradycardia, atrial flutter and atrial fibrillation during the study.  Findings:  1. LEFT VENTRICLE: The left ventricular wall thickness is normal.  The left ventricular cavity is normal in size. Wall motion is normal.  LVEF is 55-60%.  2. RIGHT VENTRICLE:  The right ventricle is normal in structure and function without any thrombus or masses.    3. LEFT ATRIUM:  The left atrium is dilated in size without any thrombus or masses.  There is not spontaneous echo contrast ("smoke") in the left atrium consistent with a low flow state.  4. LEFT ATRIAL APPENDAGE:  The left atrial appendage is free of any thrombus or masses. The appendage has multiple lobes and a cauliflower morphology. There is a small,  second accessory lobe which is caudal and anterior to the appendage and courses toward the LUPV. Pulse doppler indicates high flow in the appendage (when the patient was in sinus rhythm). The LUPV is oriented anterior to the left atrial appendage and not co-axial. Pertinent measurements in the appendage are as follows:   0 degrees 45 degrees 90 degrees 135 degrees  LAA Ostium (mm) N/A 18 17 17   LAA Length (mm) N/A 24 13 27      *LAA size should be between >17 mm and <31 mm, length generally greater than ostial length. Measure 2 cm from the coumadin ridge to ostium and across from left coronary artery.  .      5. ATRIAL SEPTUM:  The atrial septum appears intact and is free of thrombus and/or masses.  There is no evidence for interatrial shunting by color doppler and saline microbubble.  6. RIGHT ATRIUM:  The right atrium is normal in size and function without any thrombus or masses.  7. MITRAL VALVE:  The mitral valve is normal in structure and function with Mild regurgitation.  There were no vegetations or stenosis.  8. AORTIC VALVE:  The aortic valve is trileaflet, normal in structure and function with trivial regurgitation.  There were no vegetations or stenosis  9. TRICUSPID VALVE:  The tricuspid valve is normal in structure and function with trivial regurgitation.  There were no vegetations or stenosis  10.  PULMONIC VALVE:  The pulmonic valve is normal in structure and function with no regurgitation.  There were no vegetations or stenosis.   11. AORTIC ARCH, ASCENDING AND DESCENDING AORTA:  There was grade 1 (  Lollie Marrow. Al, 1992) atherosclerosis of the proximal descending aorta.  12. PULMONARY VEINS: Anomalous pulmonary venous return was not noted.  13. PERICARDIUM: The pericardium appeared normal and non-thickened.  There is no pericardial effusion.  IMPRESSION:   1. No LAA thrombus - cauliflower morphology, bi-lobar with a small cranial and anteriorly displaced accessory lobe.    2. Negative for PFO 3. Mild MR 4. Mild aortic atherosclerosis 5. No pericardial effusion.  RECOMMENDATIONS:   (available Watchman sizes: 21, 24, 27, 30, 33 mm)    1. Despite the small accessory second lobe of the appendage, the neck seems suitable for closure. The maximal ostial length was 18 mm, suggesting a device size of 21 mm.  Time Spent Directly with the Patient:  45 minutes   Pixie Casino, MD, [facility] 2 Attending Cardiologist [facility]  12/22/2014, 11:31 AM

## 2014-12-22 NOTE — Discharge Instructions (Signed)
Transesophageal Echocardiogram °Transesophageal echocardiography (TEE) is a special type of test that produces images of the heart by using sound waves (echocardiogram). This type of echocardiography can obtain better images of the heart than standard echocardiography. TEE is done by passing a flexible tube down the esophagus. The heart is located in front of the esophagus. Because the heart and esophagus are close to one another, your health care provider can take very clear, detailed pictures of the heart via ultrasound waves. °TEE may be done: °· If your health care provider needs more information based on standard echocardiography findings. °· If you had a stroke. This might have happened because a clot formed in your heart. TEE can visualize different areas of the heart and check for clots. °· To check valve anatomy and function. °· To check for infection on the inside of your heart (endocarditis). °· To evaluate the dividing wall (septum) of the heart and presence of a hole that did not close after birth (patent foramen ovale or atrial septal defect). °· To help diagnose a tear in the wall of the aorta (aortic dissection). °· During cardiac valve surgery. This allows the surgeon to assess the valve repair before closing the chest. °· During a variety of other cardiac procedures to guide positioning of catheters. °· Sometimes before a cardioversion, which is a shock to convert heart rhythm back to normal. °LET YOUR HEALTH CARE PROVIDER KNOW ABOUT:  °· Any allergies you have. °· All medicines you are taking, including vitamins, herbs, eye drops, creams, and over-the-counter medicines. °· Previous problems you or members of your family have had with the use of anesthetics. °· Any blood disorders you have. °· Previous surgeries you have had. °· Medical conditions you have. °· Swallowing difficulties. °· An esophageal obstruction. °RISKS AND COMPLICATIONS  °Generally, TEE is a safe procedure. However, as with any  procedure, complications can occur. Possible complications include an esophageal tear (rupture). °BEFORE THE PROCEDURE  °· Do not eat or drink for 6 hours before the procedure or as directed by your health care provider. °· Arrange for someone to drive you home after the procedure. Do not drive yourself home. During the procedure, you will be given medicines that can continue to make you feel drowsy and can impair your reflexes. °· An IV access tube will be started in the arm. °PROCEDURE  °· A medicine to help you relax (sedative) will be given through the IV access tube. °· A medicine may be sprayed or gargled to numb the back of the throat. °· Your blood pressure, heart rate, and breathing (vital signs) will be monitored during the procedure. °· The TEE probe is a long, flexible tube. The tip of the probe is placed into the back of the mouth, and you will be asked to swallow. This helps to pass the tip of the probe into the esophagus. Once the tip of the probe is in the correct area, your health care provider can take pictures of the heart. °· TEE is usually not a painful procedure. You may feel the probe press against the back of the throat. The probe does not enter the trachea and does not affect your breathing. °AFTER THE PROCEDURE  °· You will be in bed, resting, until you have fully returned to consciousness. °· When you first awaken, your throat may feel slightly sore and will probably still feel numb. This will improve slowly over time. °· You will not be allowed to eat or drink until it   is clear that the numbness has improved.  Once you have been able to drink, urinate, and sit on the edge of the bed without feeling sick to your stomach (nausea) or dizzy, you may be cleared to go home.  You should have a friend or family member with you for the next 24 hours after your procedure.   This information is not intended to replace advice given to you by your health care provider. Make sure you discuss any  questions you have with your health care provider.    Moderate Conscious Sedation, Adult, Care After Refer to this sheet in the next few weeks. These instructions provide you with information on caring for yourself after your procedure. Your health care provider may also give you more specific instructions. Your treatment has been planned according to current medical practices, but problems sometimes occur. Call your health care provider if you have any problems or questions after your procedure. WHAT TO EXPECT AFTER THE PROCEDURE  After your procedure:  You may feel sleepy, clumsy, and have poor balance for several hours.  Vomiting may occur if you eat too soon after the procedure. HOME CARE INSTRUCTIONS  Do not participate in any activities where you could become injured for at least 24 hours. Do not:  Drive.  Swim.  Ride a bicycle.  Operate heavy machinery.  Cook.  Use power tools.  Climb ladders.  Work from a high place.  Do not make important decisions or sign legal documents until you are improved.  If you vomit, drink water, juice, or soup when you can drink without vomiting. Make sure you have little or no nausea before eating solid foods.  Only take over-the-counter or prescription medicines for pain, discomfort, or fever as directed by your health care provider.  Make sure you and your family fully understand everything about the medicines given to you, including what side effects may occur.  You should not drink alcohol, take sleeping pills, or take medicines that cause drowsiness for at least 24 hours.  If you smoke, do not smoke without supervision.  If you are feeling better, you may resume normal activities 24 hours after you were sedated.  Keep all appointments with your health care provider. SEEK MEDICAL CARE IF:  Your skin is pale or bluish in color.  You continue to feel nauseous or vomit.  Your pain is getting worse and is not helped by  medicine.  You have bleeding or swelling.  You are still sleepy or feeling clumsy after 24 hours. SEEK IMMEDIATE MEDICAL CARE IF:  You develop a rash.  You have difficulty breathing.  You develop any type of allergic problem.  You have a fever. MAKE SURE YOU:  Understand these instructions.  Will watch your condition.  Will get help right away if you are not doing well or get worse.   This information is not intended to replace advice given to you by your health care provider. Make sure you discuss any questions you have with your health care provider.   Document Released: 12/04/2012 Document Revised: 03/06/2014 Document Reviewed: 12/04/2012 Elsevier Interactive Patient Education 2016 [place] Released: 05/06/2002 Document Revised: 02/18/2013 Document Reviewed: 08/15/2012 Elsevier Interactive Patient Education [employer].

## 2014-12-22 NOTE — H&P (Signed)
     INTERVAL PROCEDURE H&P  History and Physical Interval Note:  12/22/2014 10:05 AM  Judy White has presented today for their planned procedure. The various methods of treatment have been discussed with the patient and family. After consideration of risks, benefits and other options for treatment, the patient has consented to the procedure.  The patients' outpatient history has been reviewed, patient examined, and no change in status from most recent office note within the past 30 days. I have reviewed the patients' chart and labs and will proceed as planned. Questions were answered to the patient's satisfaction.   Pixie Casino, MD, [facility] Attending Cardiologist [place] [facility] [place] 12/22/2014, 10:05 AM

## 2014-12-23 ENCOUNTER — Encounter (HOSPITAL_COMMUNITY): Payer: Self-pay | Admitting: Internal Medicine

## 2014-12-23 DIAGNOSIS — M4316 Spondylolisthesis, lumbar region: Secondary | ICD-10-CM | POA: Diagnosis not present

## 2014-12-24 ENCOUNTER — Telehealth: Payer: Self-pay | Admitting: Internal Medicine

## 2014-12-24 NOTE — Telephone Encounter (Signed)
Per pt       Pt call back stating she has learned that she can be on a blood thinner by Dr. Bronwen Betters.    Pt would like a call back on what she and when she should do the procedure, Watchman or flutter ??  Please.

## 2014-12-24 NOTE — Telephone Encounter (Signed)
Pt called to let Dr. Tamala Julian and Dr. Rayann Heman know that she was seen by her neurologist Dr. Edwena Bunde yesterday. She had a brain scan. The   Neurology said that pt can be on anticoagulants. Pt is scheduled for the WATCHMAN ON 12/31/14, because she was not a candidate for another procedure for A-fib needing for her to be on blood thinners. So pt wants for Dr. Rayann Heman and Tamala Julian know about it, so she can have the best procedure for her. Per Pt. Dr. Bronwen Betters will send a letter to Dr. Tamala Julian about it.

## 2014-12-27 NOTE — Telephone Encounter (Signed)
I would recommend that we proceed with Watchman with short term anticoagulation as planned.

## 2014-12-28 NOTE — Telephone Encounter (Signed)
Spoke with patient and discussed Dr Jackalyn Lombard recommendations. She would like to proceed with Watchman implant. Instructions reviewed again with patient. All questions answered.

## 2014-12-31 ENCOUNTER — Ambulatory Visit (HOSPITAL_COMMUNITY): Payer: Medicare Other | Admitting: Anesthesiology

## 2014-12-31 ENCOUNTER — Inpatient Hospital Stay (HOSPITAL_COMMUNITY)
Admission: RE | Admit: 2014-12-31 | Discharge: 2015-01-01 | DRG: 274 | Disposition: A | Discharge status: Home / Self Care | Payer: Medicare Other | Source: Ambulatory Visit | Attending: Internal Medicine | Admitting: Internal Medicine

## 2014-12-31 ENCOUNTER — Encounter (HOSPITAL_COMMUNITY)
Admission: RE | Disposition: A | Discharge status: Home / Self Care | Payer: Self-pay | Source: Ambulatory Visit | Attending: Internal Medicine

## 2014-12-31 ENCOUNTER — Encounter (HOSPITAL_COMMUNITY): Payer: Self-pay | Admitting: Certified Registered Nurse Anesthetist

## 2014-12-31 ENCOUNTER — Ambulatory Visit (HOSPITAL_COMMUNITY)
Admission: RE | Admit: 2014-12-31 | Discharge: 2014-12-31 | Disposition: A | Discharge status: Home / Self Care | Payer: Medicare Other | Source: Ambulatory Visit | Attending: Cardiology | Admitting: Cardiology

## 2014-12-31 DIAGNOSIS — F329 Major depressive disorder, single episode, unspecified: Secondary | ICD-10-CM | POA: Diagnosis present

## 2014-12-31 DIAGNOSIS — I10 Essential (primary) hypertension: Secondary | ICD-10-CM | POA: Diagnosis present

## 2014-12-31 DIAGNOSIS — Z8673 Personal history of transient ischemic attack (TIA), and cerebral infarction without residual deficits: Secondary | ICD-10-CM

## 2014-12-31 DIAGNOSIS — J449 Chronic obstructive pulmonary disease, unspecified: Secondary | ICD-10-CM | POA: Diagnosis present

## 2014-12-31 DIAGNOSIS — Z7982 Long term (current) use of aspirin: Secondary | ICD-10-CM | POA: Diagnosis not present

## 2014-12-31 DIAGNOSIS — Z006 Encounter for examination for normal comparison and control in clinical research program: Secondary | ICD-10-CM | POA: Diagnosis not present

## 2014-12-31 DIAGNOSIS — F419 Anxiety disorder, unspecified: Secondary | ICD-10-CM | POA: Diagnosis present

## 2014-12-31 DIAGNOSIS — M8580 Other specified disorders of bone density and structure, unspecified site: Secondary | ICD-10-CM | POA: Diagnosis present

## 2014-12-31 DIAGNOSIS — F17200 Nicotine dependence, unspecified, uncomplicated: Secondary | ICD-10-CM | POA: Diagnosis present

## 2014-12-31 DIAGNOSIS — Z79899 Other long term (current) drug therapy: Secondary | ICD-10-CM | POA: Diagnosis not present

## 2014-12-31 DIAGNOSIS — I481 Persistent atrial fibrillation: Secondary | ICD-10-CM

## 2014-12-31 DIAGNOSIS — I480 Paroxysmal atrial fibrillation: Principal | ICD-10-CM | POA: Diagnosis present

## 2014-12-31 DIAGNOSIS — I483 Typical atrial flutter: Secondary | ICD-10-CM | POA: Diagnosis present

## 2014-12-31 DIAGNOSIS — E7800 Pure hypercholesterolemia, unspecified: Secondary | ICD-10-CM | POA: Diagnosis present

## 2014-12-31 DIAGNOSIS — I4891 Unspecified atrial fibrillation: Secondary | ICD-10-CM | POA: Diagnosis present

## 2014-12-31 DIAGNOSIS — N3281 Overactive bladder: Secondary | ICD-10-CM | POA: Diagnosis present

## 2014-12-31 DIAGNOSIS — J90 Pleural effusion, not elsewhere classified: Secondary | ICD-10-CM | POA: Diagnosis not present

## 2014-12-31 HISTORY — PX: LEFT ATRIAL APPENDAGE OCCLUSION: SHX173A

## 2014-12-31 LAB — PROTIME-INR
INR: 1.01 (ref 0.00–1.49)
Prothrombin Time: 13.5 seconds (ref 11.6–15.2)

## 2014-12-31 LAB — CBC
HEMATOCRIT: 44 % (ref 36.0–46.0)
Hemoglobin: 14.5 g/dL (ref 12.0–15.0)
MCH: 29.5 pg (ref 26.0–34.0)
MCHC: 33 g/dL (ref 30.0–36.0)
MCV: 89.6 fL (ref 78.0–100.0)
Platelets: 206 10*3/uL (ref 150–400)
RBC: 4.91 MIL/uL (ref 3.87–5.11)
RDW: 15.2 % (ref 11.5–15.5)
WBC: 8.1 10*3/uL (ref 4.0–10.5)

## 2014-12-31 LAB — BASIC METABOLIC PANEL
ANION GAP: 9 (ref 5–15)
BUN: 23 mg/dL — AB (ref 6–20)
CALCIUM: 8.8 mg/dL — AB (ref 8.9–10.3)
CO2: 25 mmol/L (ref 22–32)
Chloride: 105 mmol/L (ref 101–111)
Creatinine, Ser: 1.12 mg/dL — ABNORMAL HIGH (ref 0.44–1.00)
GFR calc Af Amer: 54 mL/min — ABNORMAL LOW (ref >60)
GFR calc non Af Amer: 46 mL/min — ABNORMAL LOW (ref >60)
GLUCOSE: 113 mg/dL — AB (ref 65–99)
Potassium: 3.9 mmol/L (ref 3.5–5.1)
Sodium: 139 mmol/L (ref 135–145)

## 2014-12-31 LAB — PREPARE RBC (CROSSMATCH)

## 2014-12-31 LAB — POCT ACTIVATED CLOTTING TIME
ACTIVATED CLOTTING TIME: 232 s
ACTIVATED CLOTTING TIME: 159 s

## 2014-12-31 LAB — MRSA PCR SCREENING: MRSA by PCR: NEGATIVE

## 2014-12-31 SURGERY — LEFT ATRIAL APPENDAGE OCCLUSION
Anesthesia: General

## 2014-12-31 MED ORDER — AMIODARONE HCL 200 MG PO TABS
200.0000 mg | ORAL_TABLET | Freq: Every day | ORAL | Status: DC
Start: 2014-12-31 — End: 2015-01-01
  Administered 2014-12-31: 200 mg via ORAL
  Filled 2014-12-31: qty 1

## 2014-12-31 MED ORDER — PHENYLEPHRINE HCL 10 MG/ML IJ SOLN
INTRAMUSCULAR | Status: DC | PRN
  Administered 2014-12-31 (×2): 80 ug via INTRAVENOUS

## 2014-12-31 MED ORDER — WARFARIN - PHYSICIAN DOSING INPATIENT
Freq: Every day | Status: DC
  Administered 2014-12-31: 18:00:00

## 2014-12-31 MED ORDER — HEPARIN SODIUM (PORCINE) 1000 UNIT/ML IJ SOLN
INTRAMUSCULAR | Status: DC | PRN
  Administered 2014-12-31: 10000 [IU] via INTRAVENOUS

## 2014-12-31 MED ORDER — HYDROCODONE-ACETAMINOPHEN 5-325 MG PO TABS
1.0000 | ORAL_TABLET | ORAL | Status: DC | PRN
  Administered 2014-12-31: 2 via ORAL
  Administered 2014-12-31: 1 via ORAL
  Filled 2014-12-31: qty 1
  Filled 2014-12-31: qty 2

## 2014-12-31 MED ORDER — FUROSEMIDE 20 MG PO TABS: 20.0000 mg | ORAL_TABLET | Freq: Every day | ORAL | Status: DC

## 2014-12-31 MED ORDER — EPHEDRINE SULFATE 50 MG/ML IJ SOLN
INTRAMUSCULAR | Status: DC | PRN
  Administered 2014-12-31: 5 mg via INTRAVENOUS
  Administered 2014-12-31: 10 mg via INTRAVENOUS

## 2014-12-31 MED ORDER — ONDANSETRON HCL 4 MG/2ML IJ SOLN
INTRAMUSCULAR | Status: DC | PRN
  Administered 2014-12-31: 4 mg via INTRAVENOUS

## 2014-12-31 MED ORDER — IOHEXOL 350 MG/ML SOLN
INTRAVENOUS | Status: DC | PRN
  Administered 2014-12-31: 70 mL via INTRAVENOUS

## 2014-12-31 MED ORDER — PROPOFOL 10 MG/ML IV BOLUS
INTRAVENOUS | Status: DC | PRN
Start: 2014-12-31 — End: 2014-12-31
  Administered 2014-12-31: 130 mg via INTRAVENOUS

## 2014-12-31 MED ORDER — SODIUM CHLORIDE 0.9 % IV SOLN: 250.0000 mL | INTRAVENOUS | Status: DC | PRN

## 2014-12-31 MED ORDER — METHOCARBAMOL 500 MG PO TABS: 500.0000 mg | ORAL_TABLET | Freq: Four times a day (QID) | ORAL | Status: DC | PRN

## 2014-12-31 MED ORDER — FENTANYL CITRATE (PF) 100 MCG/2ML IJ SOLN
INTRAMUSCULAR | Status: DC | PRN
  Administered 2014-12-31: 25 ug via INTRAVENOUS
  Administered 2014-12-31: 75 ug via INTRAVENOUS
  Administered 2014-12-31 (×2): 25 ug via INTRAVENOUS

## 2014-12-31 MED ORDER — OXYCODONE HCL 5 MG/5ML PO SOLN: 5.0000 mg | Freq: Once | ORAL | Status: DC | PRN

## 2014-12-31 MED ORDER — ASPIRIN 81 MG PO CHEW: 81.0000 mg | CHEWABLE_TABLET | Freq: Every day | ORAL | Status: DC

## 2014-12-31 MED ORDER — DILTIAZEM HCL ER COATED BEADS 120 MG PO CP24: 120.0000 mg | ORAL_CAPSULE | Freq: Every day | ORAL | Status: DC

## 2014-12-31 MED ORDER — WARFARIN VIDEO
Freq: Once | Status: AC
  Administered 2014-12-31: 20:00:00

## 2014-12-31 MED ORDER — SODIUM CHLORIDE 0.45 % IV SOLN: INTRAVENOUS | Status: DC

## 2014-12-31 MED ORDER — FENTANYL CITRATE (PF) 100 MCG/2ML IJ SOLN: 25.0000 ug | INTRAMUSCULAR | Status: DC | PRN

## 2014-12-31 MED ORDER — CEFAZOLIN SODIUM-DEXTROSE 2-3 GM-% IV SOLR
2.0000 g | INTRAVENOUS | Status: AC
  Administered 2014-12-31: 2 g via INTRAVENOUS

## 2014-12-31 MED ORDER — BUPIVACAINE HCL (PF) 0.25 % IJ SOLN
INTRAMUSCULAR | Status: DC | PRN
  Administered 2014-12-31: 20 mL

## 2014-12-31 MED ORDER — SODIUM CHLORIDE 0.9 % IV SOLN
INTRAVENOUS | Status: AC
  Administered 2014-12-31: 12:00:00 via INTRAVENOUS

## 2014-12-31 MED ORDER — SODIUM CHLORIDE 0.9 % IJ SOLN: 3.0000 mL | INTRAMUSCULAR | Status: DC | PRN

## 2014-12-31 MED ORDER — OXYCODONE HCL 5 MG PO TABS: 5.0000 mg | ORAL_TABLET | Freq: Once | ORAL | Status: DC | PRN

## 2014-12-31 MED ORDER — POTASSIUM 75 MG PO TABS: 1.0000 | ORAL_TABLET | Freq: Every day | ORAL | Status: DC

## 2014-12-31 MED ORDER — CEFAZOLIN SODIUM-DEXTROSE 2-3 GM-% IV SOLR
INTRAVENOUS | Status: AC
  Filled 2014-12-31: qty 50

## 2014-12-31 MED ORDER — SODIUM CHLORIDE 0.9 % IJ SOLN
3.0000 mL | Freq: Two times a day (BID) | INTRAMUSCULAR | Status: DC
Start: 2014-12-31 — End: 2015-01-01

## 2014-12-31 MED ORDER — LIDOCAINE HCL (CARDIAC) 20 MG/ML IV SOLN
INTRAVENOUS | Status: DC | PRN
  Administered 2014-12-31: 60 mg via INTRAVENOUS

## 2014-12-31 MED ORDER — DEXTROSE 5 % IV SOLN
10.0000 mg | INTRAVENOUS | Status: DC | PRN
  Administered 2014-12-31: 50 ug/min via INTRAVENOUS

## 2014-12-31 MED ORDER — HEPARIN (PORCINE) IN NACL 2-0.9 UNIT/ML-% IJ SOLN
INTRAMUSCULAR | Status: AC
  Filled 2014-12-31: qty 500

## 2014-12-31 MED ORDER — CLONAZEPAM 1 MG PO TABS
1.0000 mg | ORAL_TABLET | Freq: Two times a day (BID) | ORAL | Status: DC
  Administered 2014-12-31 (×2): 1 mg via ORAL
  Filled 2014-12-31 (×3): qty 1

## 2014-12-31 MED ORDER — MIDAZOLAM HCL 5 MG/5ML IJ SOLN
INTRAMUSCULAR | Status: DC | PRN
  Administered 2014-12-31 (×2): 0.5 mg via INTRAVENOUS
  Administered 2014-12-31: 1 mg via INTRAVENOUS

## 2014-12-31 MED ORDER — WARFARIN SODIUM 5 MG PO TABS
5.0000 mg | ORAL_TABLET | Freq: Once | ORAL | Status: AC
Start: 2014-12-31 — End: 2014-12-31
  Administered 2014-12-31: 5 mg via ORAL
  Filled 2014-12-31: qty 1

## 2014-12-31 MED ORDER — LACTATED RINGERS IV SOLN
INTRAVENOUS | Status: DC | PRN
  Administered 2014-12-31 (×2): via INTRAVENOUS

## 2014-12-31 MED ORDER — OXYBUTYNIN CHLORIDE ER 10 MG PO TB24
10.0000 mg | ORAL_TABLET | Freq: Every day | ORAL | Status: DC
  Administered 2014-12-31: 10 mg via ORAL
  Filled 2014-12-31 (×2): qty 1

## 2014-12-31 MED ORDER — BUPIVACAINE HCL (PF) 0.25 % IJ SOLN
INTRAMUSCULAR | Status: AC
  Filled 2014-12-31: qty 30

## 2014-12-31 MED ORDER — PATIENT'S GUIDE TO USING COUMADIN BOOK
Freq: Once | Status: AC
  Administered 2014-12-31: 14:00:00
  Filled 2014-12-31: qty 1

## 2014-12-31 MED ORDER — HEPARIN (PORCINE) IN NACL 2-0.9 UNIT/ML-% IJ SOLN
INTRAMUSCULAR | Status: DC | PRN
  Administered 2014-12-31: 08:00:00

## 2014-12-31 MED ORDER — ROCURONIUM BROMIDE 100 MG/10ML IV SOLN
INTRAVENOUS | Status: DC | PRN
  Administered 2014-12-31: 30 mg via INTRAVENOUS
  Administered 2014-12-31 (×2): 20 mg via INTRAVENOUS

## 2014-12-31 MED ORDER — ACETAMINOPHEN 325 MG PO TABS
650.0000 mg | ORAL_TABLET | ORAL | Status: DC | PRN
  Filled 2014-12-31: qty 2

## 2014-12-31 MED ORDER — SUGAMMADEX SODIUM 200 MG/2ML IV SOLN
INTRAVENOUS | Status: DC | PRN
  Administered 2014-12-31: 200 mg via INTRAVENOUS

## 2014-12-31 MED ORDER — PROTAMINE SULFATE 10 MG/ML IV SOLN
INTRAVENOUS | Status: DC | PRN
  Administered 2014-12-31 (×4): 5 mg via INTRAVENOUS

## 2014-12-31 MED ORDER — ONDANSETRON HCL 4 MG/2ML IJ SOLN: 4.0000 mg | Freq: Four times a day (QID) | INTRAMUSCULAR | Status: DC | PRN

## 2014-12-31 MED ORDER — FLUOXETINE HCL 20 MG PO CAPS
20.0000 mg | ORAL_CAPSULE | Freq: Every day | ORAL | Status: DC
  Administered 2014-12-31: 20 mg via ORAL
  Filled 2014-12-31: qty 1

## 2014-12-31 SURGICAL SUPPLY — 17 items
BAG SNAP BAND KOVER 36X36 (MISCELLANEOUS) IMPLANT
BLANKET WARM UNDERBOD FULL ACC (MISCELLANEOUS) ×2 IMPLANT
CATH DIAG 6FR PIGTAIL (CATHETERS) ×2 IMPLANT
DILATOR VESSEL 38 20CM 8FR (INTRODUCER) ×2 IMPLANT
KIT HEART LEFT (KITS) ×2 IMPLANT
NDL TRANSEP BRK 71CM 407200 (NEEDLE) IMPLANT
NEEDLE TRANSEP BRK 71CM 407200 (NEEDLE) ×3 IMPLANT
PACK CARDIAC CATHETERIZATION (CUSTOM PROCEDURE TRAY) ×2 IMPLANT
PAD DEFIB LIFELINK (PAD) ×2 IMPLANT
SHEATH INTRO CHECKFLO 16F 13 (SHEATH) IMPLANT
SHEATH INTRO CHECKFLO 16F 13CM (SHEATH) ×2
SHEATH SWARTZ TS SL2 63CM 8.5F (SHEATH) ×2 IMPLANT
SHIELD RADPAD SCOOP 12X17 (MISCELLANEOUS) ×2 IMPLANT
TRANSDUCER W/STOPCOCK (MISCELLANEOUS) ×2 IMPLANT
WATCHMAN ACCESS ANTERIOR CURVE (SHEATH) ×2 IMPLANT
WATCHMAN CLOSURE 30MM (Prosthesis & Implant Heart) ×2 IMPLANT
WIRE AMPLATZ WHISKJ .035X260CM (WIRE) ×2 IMPLANT

## 2014-12-31 NOTE — Interval H&P Note (Signed)
History and Physical Interval Note:  I had a long discussion with Dr Tamala Julian.  He has reviewed patients chart at length and knows her well.  As an independent physician who knows the patient well, he has recommended that we proceed with watchman implant.  Pt is aware of risks involved and wishes to proceed.   Risks including but not limited to bleeding, stroke, vascular damage, tamponade, perforation, damage to the heart and other structures, worsening renal function, and death. The patient understands these risk and wishes to proceed at this time.   12/31/2014 8:06 AM  Judy White  has presented today for surgery, with the diagnosis of AFIB  The various methods of treatment have been discussed with the patient and family. After consideration of risks, benefits and other options for treatment, the patient has consented to  Procedure(s): LEFT ATRIAL APPENDAGE OCCLUSION (N/A) as a surgical intervention .  The patient's history has been reviewed, patient examined, no change in status, stable for surgery.  I have reviewed the patient's chart and labs.  Questions were answered to the patient's satisfaction.     Thompson Grayer

## 2014-12-31 NOTE — Care Management Note (Signed)
Case Management Note  Patient Details  Name: Judy White MRN: [id number] Date of Birth: [birth year]  Subjective/Objective:      Adm w atrial appendage insertion              Action/Plan:lives w [facility], pcp dr Herbie [place]   Expected Discharge Date:                  Expected Discharge Plan:     In-House Referral:     Discharge planning Services     Post Acute Care Choice:    Choice offered to:     DME Arranged:    DME Agency:     HH Arranged:    [place] Agency:     Status of Service:     Medicare Important Message Given:    Date Medicare IM Given:    Medicare IM give by:    Date Additional Medicare IM Given:    Additional Medicare Important Message give by:     If discussed at [place] of Stay Meetings, dates discussed:    Additional Comments: ur review done  Lacretia Leigh, RN 12/31/2014, 11:40 AM

## 2014-12-31 NOTE — Anesthesia Preprocedure Evaluation (Signed)
Anesthesia Evaluation  Patient identified by MRN, date of birth, ID band Patient awake    Reviewed: Allergy & Precautions, NPO status , Patient's Chart, lab work & pertinent test results  History of Anesthesia Complications Negative for: history of anesthetic complications  Airway Mallampati: II  TM Distance: >3 FB Neck ROM: Full    Dental  (+) Teeth Intact   Pulmonary neg shortness of breath, neg sleep apnea, COPD,  COPD inhaler, neg recent URI, Current Smoker,    breath sounds clear to auscultation       Cardiovascular hypertension, Pt. on medications (-) angina(-) Past MI and (-) CHF + dysrhythmias Atrial Fibrillation  Rhythm:Irregular + Systolic murmurs    Neuro/Psych neg Seizures PSYCHIATRIC DISORDERS Anxiety Depression  Neuromuscular disease    GI/Hepatic negative GI ROS, Neg liver ROS,   Endo/Other  negative endocrine ROS  Renal/GU negative Renal ROS     Musculoskeletal negative musculoskeletal ROS (+)   Abdominal   Peds  Hematology negative hematology ROS (+)   Anesthesia Other Findings   Reproductive/Obstetrics                             Anesthesia Physical Anesthesia Plan  ASA: II  Anesthesia Plan: General   Post-op Pain Management:    Induction: Intravenous  Airway Management Planned: Oral ETT  Additional Equipment: Arterial line  Intra-op Plan:   Post-operative Plan: Extubation in OR  Informed Consent: I have reviewed the patients History and Physical, chart, labs and discussed the procedure including the risks, benefits and alternatives for the proposed anesthesia with the patient or authorized representative who has indicated his/her understanding and acceptance.   Dental advisory given  Plan Discussed with: CRNA and Surgeon  Anesthesia Plan Comments:         Anesthesia Quick Evaluation

## 2014-12-31 NOTE — Transfer of Care (Signed)
Immediate Anesthesia Transfer of Care Note  Patient: Judy White  Procedure(s) Performed: Procedure(s): LEFT ATRIAL APPENDAGE OCCLUSION (N/A)  Patient Location: PACU  Anesthesia Type:General  Level of Consciousness: awake, alert , oriented and patient cooperative  Airway & Oxygen Therapy: Patient Spontanous Breathing and Patient connected to nasal cannula oxygen  Post-op Assessment: Report given to RN, Post -op Vital signs reviewed and stable, Patient moving all extremities and Patient moving all extremities X 4  Post vital signs: Reviewed and stable  Last Vitals:  Filed Vitals:   12/31/14 0947  BP:   Pulse: 0  Temp:   Resp:     Complications: No apparent anesthesia complications

## 2014-12-31 NOTE — Progress Notes (Signed)
Site area: RFV Site Prior to Removal:  Level 0 Pressure Applied For:58min Manual: yes   Patient Status During Pull:stable   Post Pull Site:  Level0 Post Pull Instructions Given:yes   Post Pull Pulses Present palpable:  Dressing Applied: clear  Bedrest begins @ 1100 till 1700 Comments:

## 2014-12-31 NOTE — Anesthesia Procedure Notes (Signed)
Procedure Name: Intubation Date/Time: 12/31/2014 8:00 AM Performed by: Layla Maw Pre-anesthesia Checklist: Patient identified, Patient being monitored, Timeout performed, Emergency Drugs available and Suction available Patient Re-evaluated:Patient Re-evaluated prior to inductionOxygen Delivery Method: Circle System Utilized Preoxygenation: Pre-oxygenation with 100% oxygen Intubation Type: IV induction Ventilation: Mask ventilation without difficulty Laryngoscope Size: Miller and 3 Grade View: Grade I Tube type: Oral Tube size: 7.0 mm Number of attempts: 1 Airway Equipment and Method: Stylet Placement Confirmation: ETT inserted through vocal cords under direct vision,  positive ETCO2 and breath sounds checked- equal and bilateral Secured at: 22 cm Tube secured with: Tape Dental Injury: Teeth and Oropharynx as per pre-operative assessment

## 2014-12-31 NOTE — H&P (View-Only) (Signed)
Electrophysiology Office Note   Date:  12/17/2014   ID:  Judy White, DOB 05/18/1940, MRN [id number]  PCP:  Henrine Screws, MD  Cardiologist:  Dr Tamala Julian Primary Electrophysiologist: Thompson Grayer, MD    Chief Complaint  Patient presents with  . PAF     History of Present Illness: Judy White is a 74 y.o. female who presents today for electrophysiology follow-up.   She has done well since I last saw her. SHe continues to have occasional episodes of afib.  She remains afraid to try anticoagulation long term given prior ICH.  She is very intersted in left atrial appendage occlusion and does feel that she could take anticoagulation for the required duration. Today, she denies symptoms of palpitations, chest pain, shortness of breath, orthopnea, PND, lower extremity edema, claudication, dizziness, presyncope, syncope, bleeding, or neurologic sequela. The patient is tolerating medications without difficulties and is otherwise without complaint today.    Past Medical History  Diagnosis Date  . Hypercholesteremia   . Osteopenia   . Depressive disorder, not elsewhere classified   . Anxiety   . Paroxysmal atrial fibrillation (HCC)   . Myalgia and myositis, unspecified   . COPD (chronic obstructive pulmonary disease) ([place])   . Overactive bladder   . Dysrhythmia     af  . HTN (hypertension)     Judy White   Past Surgical History  Procedure Laterality Date  . Tonsillectomy    . Appendectomy    . Cesarean section    . Wisdom tooth extraction    . Shoulder surgery    . Re-excision of breast lumpectomy    . Cardiac catheterization      >5 yrs  . Breast surgery       Current Outpatient Prescriptions  Medication Sig Dispense Refill  . amiodarone (PACERONE) 200 MG tablet Take 200 mg by mouth daily.    . Ascorbic Acid (VITAMIN C PO) Take 500 mg by mouth daily.     Marland Kitchen aspirin 81 MG tablet Take 81 mg by mouth daily.    . Calcium-Vitamin D (CALTRATE 600 PLUS-VIT D PO) Take 1  tablet by mouth daily.     . chlorhexidine (PERIDEX) 0.12 % solution as directed.    . cholecalciferol (VITAMIN D) 1000 UNITS tablet Take 1,000 Units by mouth daily.    . clonazePAM (KLONOPIN) 1 MG tablet Take 1 tablet by mouth 2 (two) times daily.    . Cyanocobalamin (VITAMIN B-12 PO) Take 1 tablet by mouth daily.     Marland Kitchen diltiazem (CARDIZEM CD) 120 MG 24 hr capsule Take 1 capsule (120 mg total) by mouth daily. 30 capsule 11  . FLUoxetine (PROZAC) 20 MG capsule Take 20 mg by mouth daily.     . furosemide (LASIX) 20 MG tablet Take 1 tablet (20 mg total) by mouth daily. 90 tablet 3  . methocarbamol (ROBAXIN) 500 MG tablet Take 1 tablet (500 mg total) by mouth every 6 (six) hours as needed for muscle spasms. 60 tablet 3  . Omega-3 Fatty Acids (FISH OIL PO) Take 1 tablet by mouth daily.     Marland Kitchen oxybutynin (DITROPAN-XL) 10 MG 24 hr tablet Take 1 tablet by mouth daily.    . Potassium 75 MG TABS Take 1 tablet by mouth daily.     No current facility-administered medications for this visit.    Allergies:   Review of patient's allergies indicates no known allergies.   Social History:  The patient  reports that she  has been smoking.  She has never used smokeless tobacco. She reports that she drinks alcohol. She reports that she does not use illicit drugs.   Family History:  The patient's  family history includes Aneurysm in her father; Cancer in an other family member; Heart Problems in an other family member; Heart disease in her brother; Hypotension in her mother; Neuropathy in her brother.    ROS:  Please see the history of present illness.   All other systems are reviewed and negative.    PHYSICAL EXAM: VS:  BP 112/64 mmHg  Pulse 54  Ht 5\' 6"  (1.676 m)  Wt 178 lb 12.8 oz (81.103 kg)  BMI 28.87 kg/m2 , BMI Body mass index is 28.87 kg/(m^2). GEN: Well nourished, well developed, in no acute distress HEENT: normal Neck: no JVD, carotid bruits, or masses Cardiac: RRR; no murmurs, rubs, or  gallops,no edema  Respiratory:  clear to auscultation bilaterally, normal work of breathing GI: soft, nontender, nondistended, + BS MS: no deformity or atrophy Skin: warm and dry  Neuro:  Strength and sensation are intact Psych: euthymic mood, full affect  EKG:  EKG is ordered today. The ekg ordered today shows sinus rhythm   Recent Labs: 08/12/2014: Hemoglobin 12.3; Platelets 154 08/25/2014: Pro B Natriuretic peptide (BNP) 136.0* 09/02/2014: BUN 20; Creatinine, Ser 1.00; Potassium 4.1; Sodium 138    Lipid Panel  No results found for: CHOL, TRIG, HDL, CHOLHDL, VLDL, LDLCALC, LDLDIRECT   Wt Readings from Last 3 Encounters:  12/16/14 178 lb 12.8 oz (81.103 kg)  11/27/14 178 lb 1.9 oz (80.795 kg)  08/25/14 187 lb 6.4 oz (85.004 kg)      Other studies Reviewed: Additional studies/ records that were reviewed today include: Dr Darliss Ridgel recent note   ASSESSMENT AND PLAN:   1. Paroxysmal atrial fibrillation stable Her chads2vasc score is at least 4.  She is not anticoagulated due to prior ICH on coumadin.  The patients chart has been reviewed and I feel that they would be a candidate for short term oral anticoagulation.  Procedural risks for the Watchman implant have been reviewed with the patient including a 1% risk of stroke, 2% risk of perforation, 0.1% risk of device embolization.  Given the patient's poor candidacy for long-term oral anticoagulation, ability to tolerate short term oral anticoagulation, I have recommended the watchman left atrial appendage closure system.  TEE will be scheduled to review LAA anatomy.  The patient understands that the ability to implant Watchman is dependent on results of the TEE.  If patient is candidate for Watchman based on TEE results, we will schedule the procedure at the next available time.   2. HTN Stable No change required today  Today, I have spent 40 minutes with the patient discussing anticoagulation/ watchman implant.  More than 50% of  the visit time today was spent on this issue.    Current medicines are reviewed at length with the patient today.   The patient does not have concerns regarding her medicines.  The following changes were made today:  none  Signed, Thompson Grayer, MD  12/17/2014 9:19 PM     [place] [id number] [phone] (office) [phone] (fax)

## 2014-12-31 NOTE — Discharge Summary (Signed)
ELECTROPHYSIOLOGY PROCEDURE DISCHARGE SUMMARY    Patient ID: Judy White,  MRN: [id number], DOB/AGE: 76-Apr-1942 76 y.o.  Admit date: 12/31/2014 Discharge date: 01/01/2015  Primary Care Physician: Henrine Screws, MD Primary Cardiologist: Tamala Julian Electrophysiologist: Thompson Grayer, MD  Primary Discharge Diagnosis:  Paroxysmal atrial fibrillation with prior ICH on anticoagulation status post LAA occluder insertion this admission  Secondary Discharge Diagnosis:  1.  HTN 2.  COPD 3.  Anxiety  Procedures This Admission:  1.  Insertion of left atrial appendage occluder (Watchman) on 12/31/14 by Dr Rayann Heman and Dr Burt Knack.  This study demonstrated successful implantation of Watchman LAA occluder with no early apparent complications. TEE at time of procedure demonstrated no leak around device.   Brief HPI: Judy White is a 74 y.o. female with a history of paroxysmal atrial fibrillation.  They are felt to not be a candidate for long term Warfarin due to prior ICH. Risks, benefits, and alternatives to Watchman implant were reviewed with the patient who wished to proceed.  The patient underwent TEE prior to the procedure which demonstrated appendage suitable for attempt at [facility] placement.    Hospital Course:  The patient was admitted and underwent Watchman insertion with details as outlined above.  They were monitored on telemetry overnight which demonstrated sinus rhythm.  Groin was without complication on the day of discharge.  The patient was examined and considered to be stable for discharge.  Bedside echo by Dr Rayann Heman demonstrated stable device position without pericardial effusion.  Wound care and restrictions were reviewed with the patient.   This patients CHA2DS2-VASc Score is 4 Above score calculated as 1 point each if present [CHF, HTN, DM, Vascular=MI/PAD/Aortic Plaque, Age if 65-74, or Female] Above score calculated as 2 points each if present [Age > 75, or Stroke/TIA/TE]  The  patient will be started on Warfarin at discharge with goal INR of 2-3.  Coumadin clinic follow up will be arranged in 4 days.  The patient will be seen by APP in 1 week for groin check, monitor for s/s of bleeding, CBC.  45 day TEE and office visit with AF clinic will also be scheduled at the 1 week follow up visit.   Physical Exam: Filed Vitals:   01/01/15 0000 01/01/15 0019 01/01/15 0344 01/01/15 0802  BP: 102/56 102/56 83/58 94/47   Pulse: 49 44 52   Temp:  97.8 F (36.6 C) 97.8 F (36.6 C) 98.5 F (36.9 C)  TempSrc:  Oral Oral Oral  Resp: 17 16 15 13   Height:      Weight:      SpO2: 98% 93% 90% 94%    GEN- The patient is well appearing, alert and oriented x 3 today.   HEENT: normocephalic, atraumatic; sclera clear, conjunctiva pink; hearing intact; oropharynx clear; neck supple Lungs- Clear to ausculation bilaterally, normal work of breathing.  No wheezes, rales, rhonchi Heart- Regular rate and rhythm, no murmurs, rubs or gallops GI- soft, non-tender, non-distended, bowel sounds present Extremities- no clubbing, cyanosis, or edema; DP/PT/radial pulses 2+ bilaterally, groin without hematoma/bruit MS- no significant deformity or atrophy Skin- warm and dry, no rash or lesion Psych- euthymic mood, full affect Neuro- strength and sensation are intact   Labs:   Lab Results  Component Value Date   WBC 8.1 12/31/2014   HGB 14.5 12/31/2014   HCT 44.0 12/31/2014   MCV 89.6 12/31/2014   PLT 206 12/31/2014     Recent Labs Lab 01/01/15 0359  NA 141  K 4.0  CL 102  CO2 31  BUN 17  CREATININE 1.06*  CALCIUM 8.4*  GLUCOSE 96     Discharge Medications:    Medication List    TAKE these medications        amiodarone 200 MG tablet  Commonly known as:  PACERONE  Take 200 mg by mouth daily.     aspirin 81 MG tablet  Take 81 mg by mouth daily.     CALTRATE 600 PLUS-VIT D PO  Take 1 tablet by mouth daily.     cholecalciferol 1000 UNITS tablet  Commonly known as:   VITAMIN D  Take 1,000 Units by mouth daily.     clonazePAM 1 MG tablet  Commonly known as:  KLONOPIN  Take 1 tablet by mouth 2 (two) times daily.     diltiazem 120 MG 24 hr capsule  Commonly known as:  CARDIZEM CD  Take 1 capsule (120 mg total) by mouth daily.     FISH OIL PO  Take 1 tablet by mouth daily.     FLUoxetine 20 MG capsule  Commonly known as:  PROZAC  Take 20 mg by mouth daily.     furosemide 20 MG tablet  Commonly known as:  LASIX  Take 1 tablet (20 mg total) by mouth daily.     methocarbamol 500 MG tablet  Commonly known as:  ROBAXIN  Take 1 tablet (500 mg total) by mouth every 6 (six) hours as needed for muscle spasms.     oxybutynin 10 MG 24 hr tablet  Commonly known as:  DITROPAN-XL  Take 1 tablet by mouth daily.     Potassium 75 MG Tabs  Take 1 tablet by mouth daily.     VITAMIN B-12 PO  Take 1 tablet by mouth daily.     VITAMIN C PO  Take 500 mg by mouth daily.     warfarin 5 MG tablet  Commonly known as:  COUMADIN  Take 1 tablet (5 mg total) by mouth daily.        Disposition:   Follow-up Information    Follow up with [facility] On 01/04/2015.   Specialty:  Cardiology   Why:  at 9:35AM for coumadin check   Contact information:   [address], [place] [phone]      Follow up with Thompson Grayer, MD On 01/11/2015.   Specialty:  Cardiology   Why:  at [place] (appt with [name], NP)   Contact information:   [place] [address] [phone]       Duration of Discharge Encounter: Greater than 30 minutes including physician time.  Signed, Thompson Grayer, MD 01/01/2015 8:38 AM

## 2014-12-31 NOTE — Progress Notes (Signed)
Echocardiogram Echocardiogram Transesophageal(Watchman procedure) has been performed.  Judy White 12/31/2014, 12:39 PM

## 2014-12-31 NOTE — Discharge Instructions (Signed)
No driving for 4 days. No lifting over 5 lbs for 1 week. No sexual activity for 1 week. You may return to work in 1 week. Keep procedure site clean & dry. If you notice increased pain, swelling, bleeding or pus, call/return!  You may shower, but no soaking baths/hot tubs/pools for 1 week.  ° ° °

## 2014-12-31 NOTE — Progress Notes (Addendum)
Site area: R radial a-line Site Prior to Removal:  Level 0 Pressure Applied For:70min Manual:   yes Patient Status During Pull:  stable Post Pull Site:  Level Post Pull Instructions Given:yes   Post Pull Pulses Present: palpable Dressing Applied:  clear Bedrest begins @  N/A Comments:

## 2015-01-01 ENCOUNTER — Encounter (HOSPITAL_COMMUNITY): Payer: Self-pay | Admitting: Internal Medicine

## 2015-01-01 ENCOUNTER — Other Ambulatory Visit: Payer: Self-pay

## 2015-01-01 ENCOUNTER — Inpatient Hospital Stay (HOSPITAL_COMMUNITY): Payer: Medicare Other

## 2015-01-01 DIAGNOSIS — I480 Paroxysmal atrial fibrillation: Principal | ICD-10-CM

## 2015-01-01 LAB — BASIC METABOLIC PANEL
Anion gap: 8 (ref 5–15)
BUN: 17 mg/dL (ref 6–20)
CHLORIDE: 102 mmol/L (ref 101–111)
CO2: 31 mmol/L (ref 22–32)
Calcium: 8.4 mg/dL — ABNORMAL LOW (ref 8.9–10.3)
Creatinine, Ser: 1.06 mg/dL — ABNORMAL HIGH (ref 0.44–1.00)
GFR calc Af Amer: 58 mL/min — ABNORMAL LOW (ref >60)
GFR, EST NON AFRICAN AMERICAN: 50 mL/min — AB (ref >60)
GLUCOSE: 96 mg/dL (ref 65–99)
POTASSIUM: 4 mmol/L (ref 3.5–5.1)
Sodium: 141 mmol/L (ref 135–145)

## 2015-01-01 LAB — TYPE AND SCREEN
ABO/RH(D): O NEG
Antibody Screen: NEGATIVE
UNIT DIVISION: 0
Unit division: 0

## 2015-01-01 MED ORDER — WARFARIN SODIUM 5 MG PO TABS: 5.0000 mg | ORAL_TABLET | Freq: Every day | ORAL | Status: DC

## 2015-01-01 NOTE — Anesthesia Postprocedure Evaluation (Signed)
  Anesthesia Post-op Note  Patient: Judy White  Procedure(s) Performed: Procedure(s): LEFT ATRIAL APPENDAGE OCCLUSION (N/A)  Patient Location: PACU  Anesthesia Type:General  Level of Consciousness: awake  Airway and Oxygen Therapy: Patient Spontanous Breathing  Post-op Pain: mild  Post-op Assessment: Post-op Vital signs reviewed, Patient's Cardiovascular Status Stable, Respiratory Function Stable, Patent Airway, No signs of Nausea or vomiting and Pain level controlled              Post-op Vital Signs: Reviewed and stable  Last Vitals:  Filed Vitals:   01/01/15 0802  BP: 94/47  Pulse:   Temp: 36.9 C  Resp: 13    Complications: No apparent anesthesia complications

## 2015-01-02 ENCOUNTER — Other Ambulatory Visit: Payer: Self-pay | Admitting: Interventional Cardiology

## 2015-01-04 ENCOUNTER — Ambulatory Visit (INDEPENDENT_AMBULATORY_CARE_PROVIDER_SITE_OTHER): Payer: Medicare Other | Admitting: Pharmacist

## 2015-01-04 DIAGNOSIS — I480 Paroxysmal atrial fibrillation: Secondary | ICD-10-CM

## 2015-01-04 LAB — POCT INR: INR: 2.6

## 2015-01-06 DIAGNOSIS — F3341 Major depressive disorder, recurrent, in partial remission: Secondary | ICD-10-CM | POA: Diagnosis not present

## 2015-01-11 ENCOUNTER — Ambulatory Visit (INDEPENDENT_AMBULATORY_CARE_PROVIDER_SITE_OTHER): Payer: Medicare Other | Admitting: Nurse Practitioner

## 2015-01-11 ENCOUNTER — Encounter: Payer: Self-pay | Admitting: Internal Medicine

## 2015-01-11 ENCOUNTER — Ambulatory Visit (INDEPENDENT_AMBULATORY_CARE_PROVIDER_SITE_OTHER): Payer: Medicare Other | Admitting: *Deleted

## 2015-01-11 VITALS — BP 116/70 | HR 57 | Ht 66.0 in | Wt 182.0 lb

## 2015-01-11 DIAGNOSIS — I480 Paroxysmal atrial fibrillation: Secondary | ICD-10-CM

## 2015-01-11 DIAGNOSIS — I10 Essential (primary) hypertension: Secondary | ICD-10-CM

## 2015-01-11 LAB — POCT INR: INR: 5.3

## 2015-01-11 NOTE — Patient Instructions (Addendum)
Medication Instructions:  Your physician recommends that you continue on your current medications as directed. Please refer to the Current Medication list given to you today.   Labwork: None ordered   Testing/Procedures: Your physician has requested that you have a TEE. During a TEE, sound waves are used to create images of your heart. It provides your doctor with information about the size and shape of your heart and how well your heart's chambers and valves are working. In this test, a transducer is attached to the end of a flexible tube that's guided down your throat and into your esophagus (the tube leading from you mouth to your stomach) to get a more detailed image of your heart. You are not awake for the procedure. Please see the instruction sheet given to you today. For further information please visit [email or web address].   Please arrive at the [place] [facility] on 02/16/15 at 7:30am  Do not eat or drink after midnight the night before.    Follow-Up: Your physician recommends that you schedule a follow-up appointment on 12/21/156 with [name], NP   Any Other Special Instructions Will Be Listed Below (If Applicable).     If you need a refill on your cardiac medications before your next appointment, please call your pharmacy.

## 2015-01-11 NOTE — Progress Notes (Signed)
Electrophysiology Office Note Date: 01/11/2015  ID:  Judy White, DOB [birth year], MRN [id number]  PCP: Henrine Screws, MD Primary Cardiologist: Tamala Julian Electrophysiologist: Rayann Heman  CC: Watchman follow up  Judy White is a 74 y.o. female seen today for Dr Rayann Heman.  She presents today for post Watchman follow up.  Since discharge, the patient reports doing very well.  She is taking Warfarin and ASA as prescribed and has follow up with CVRR later today.  She has had intermittent headaches that are relieved with Tylenol and very different from previous ICH headaches.  She denies chest pain, palpitations, dyspnea, PND, orthopnea, nausea, vomiting, dizziness, syncope.  She has not had other bleeding issues.   Past Medical History  Diagnosis Date  . Hypercholesteremia   . Osteopenia   . Depressive disorder, not elsewhere classified   . Anxiety   . Paroxysmal atrial fibrillation (HCC)   . Myalgia and myositis, unspecified   . COPD (chronic obstructive pulmonary disease) ([place])   . Overactive bladder   . Paroxysmal atrial fibrillation (HCC)   . HTN (hypertension)    Past Surgical History  Procedure Laterality Date  . Tonsillectomy    . Appendectomy    . Cesarean section    . Wisdom tooth extraction    . Shoulder surgery    . Re-excision of breast lumpectomy    . Cardiac catheterization      >5 yrs  . Breast surgery    . Tee without cardioversion N/A 12/22/2014    Procedure: TRANSESOPHAGEAL ECHOCARDIOGRAM (TEE);  Surgeon: Pixie Casino, MD;  Location[facility] END[facility]COPY;  Service: Cardiovascular;  Laterality: N/A;  . Electrophysiologic study N/A 12/31/2014    LEFT ATRIAL APPENDAGE OCCLUSION;  WATCHMAN implant by Dr Rayann Heman    Current Outpatient Prescriptions  Medication Sig Dispense Refill  . acetaminophen (TYLENOL) 500 MG tablet Take 500 mg by mouth every 6 (six) hours as needed (pain).    Marland Kitchen amiodarone (PACERONE) 200 MG tablet Take 200 mg by mouth daily.    . Ascorbic Acid  (VITAMIN C PO) Take 500 mg by mouth daily.     Marland Kitchen aspirin 81 MG tablet Take 81 mg by mouth daily.    . Calcium-Vitamin D (CALTRATE 600 PLUS-VIT D PO) Take 1 tablet by mouth daily.     . cholecalciferol (VITAMIN D) 1000 UNITS tablet Take 1,000 Units by mouth daily.    . clonazePAM (KLONOPIN) 1 MG tablet Take 1 tablet by mouth 2 (two) times daily.    . Cyanocobalamin (VITAMIN B-12 PO) Take 1 tablet by mouth daily.     Marland Kitchen diltiazem (CARDIZEM CD) 120 MG 24 hr capsule Take 1 capsule (120 mg total) by mouth daily. 30 capsule 9  . FLUoxetine (PROZAC) 20 MG capsule Take 20 mg by mouth daily.     . furosemide (LASIX) 20 MG tablet Take 1 tablet (20 mg total) by mouth daily. 90 tablet 3  . methocarbamol (ROBAXIN) 500 MG tablet Take 1 tablet (500 mg total) by mouth every 6 (six) hours as needed for muscle spasms. 60 tablet 3  . Omega-3 Fatty Acids (FISH OIL PO) Take 1 tablet by mouth daily.     Marland Kitchen oxybutynin (DITROPAN-XL) 10 MG 24 hr tablet Take 1 tablet by mouth daily.    . Potassium 75 MG TABS Take 1 tablet by mouth daily.    Marland Kitchen warfarin (COUMADIN) 5 MG tablet Take 1 tablet (5 mg total) by mouth daily. 45 tablet 0   No  current facility-administered medications for this visit.    Allergies:   Review of patient's allergies indicates no known allergies.   Social History: Social History   Social History  . Marital Status: Married    Spouse Name: N/A  . Number of Children: N/A  . Years of Education: N/A   Occupational History  . Not on file.   Social History Main Topics  . Smoking status: Current Every Day Smoker -- 0.50 packs/day for 50 years  . Smokeless tobacco: Never Used  . Alcohol Use: 0.0 oz/week    0 Standard drinks or equivalent per week     Comment: occ  . Drug Use: No  . Sexual Activity: Not on file   Other Topics Concern  . Not on file   Social History Narrative    Family History: Family History  Problem Relation Age of Onset  . Cancer      maternal side  . Heart Problems       paternal side  . Aneurysm Father   . Hypotension Mother   . Neuropathy Brother   . Heart disease Brother     Review of Systems: All other systems reviewed and are otherwise negative except as noted above.   Physical Exam: VS:  BP 116/70 mmHg  Pulse 57  Ht 5\' 6"  (1.676 m)  Wt 182 lb (82.555 kg)  BMI 29.39 kg/m2 , BMI Body mass index is 29.39 kg/(m^2). Wt Readings from Last 3 Encounters:  01/11/15 182 lb (82.555 kg)  12/31/14 175 lb (79.379 kg)  12/22/14 178 lb (80.74 kg)    GEN- The patient is well appearing, alert and oriented x 3 today.   HEENT: normocephalic, atraumatic; sclera clear, conjunctiva pink; hearing intact; oropharynx clear; neck supple  Lungs- Clear to ausculation bilaterally, normal work of breathing.  No wheezes, rales, rhonchi Heart- Regular rate and rhythm, no murmurs, rubs or gallops  GI- soft, non-tender, non-distended, bowel sounds present  Extremities- no clubbing, cyanosis, or edema; DP/PT/radial pulses 2+ bilaterally MS- no significant deformity or atrophy Skin- warm and dry, no rash or lesion  Psych- euthymic mood, full affect Neuro- strength and sensation are intact   EKG:  EKG is not ordered today.  Recent Labs: 08/25/2014: Pro B Natriuretic peptide (BNP) 136.0* 12/31/2014: Hemoglobin 14.5; Platelets 206 01/01/2015: BUN 17; Creatinine, Ser 1.06*; Potassium 4.0; Sodium 141    Other studies Reviewed: Additional studies/ records that were reviewed today include: hospital records  Assessment and Plan: 1.  Paroxysmal atrial fibrillation Maintaining SR by symptoms S/p Watchman implant 12/31/14 - doing well post procedure. She does have occasional sinus headaches relieved by tylenol that are very different from previous ICH headaches. No visual changes, focal neuro defects. She is aware of warning symptoms of ICH and will seek medical attention if they occur. TEE 02/16/15 to evaluate device and ability to discontinue Warfarin Continue Warfarin  for now until after TEE at 6 weeks for [facility] of at least 4  2.  HTN Stable No change required today  Current medicines are reviewed at length with the patient today.   The patient does not have concerns regarding her medicines.  The following changes were made today:  none  Labs/ tests ordered today include: none    Disposition:   Follow up with me in 6 weeks after TEE   Signed, [name], NP 01/11/2015 8:53 AM   [place] [address] [phone] (office) [phone] (fax)

## 2015-01-18 ENCOUNTER — Ambulatory Visit (INDEPENDENT_AMBULATORY_CARE_PROVIDER_SITE_OTHER): Payer: Medicare Other | Admitting: *Deleted

## 2015-01-18 DIAGNOSIS — I480 Paroxysmal atrial fibrillation: Secondary | ICD-10-CM | POA: Diagnosis not present

## 2015-01-18 LAB — POCT INR: INR: 1.5

## 2015-01-26 ENCOUNTER — Ambulatory Visit (INDEPENDENT_AMBULATORY_CARE_PROVIDER_SITE_OTHER): Payer: Medicare Other | Admitting: Pharmacist

## 2015-01-26 DIAGNOSIS — I480 Paroxysmal atrial fibrillation: Secondary | ICD-10-CM | POA: Diagnosis not present

## 2015-01-26 LAB — POCT INR: INR: 2.7

## 2015-02-02 ENCOUNTER — Ambulatory Visit (INDEPENDENT_AMBULATORY_CARE_PROVIDER_SITE_OTHER): Payer: Medicare Other | Admitting: *Deleted

## 2015-02-02 DIAGNOSIS — I480 Paroxysmal atrial fibrillation: Secondary | ICD-10-CM

## 2015-02-02 LAB — POCT INR: INR: 3

## 2015-02-09 ENCOUNTER — Encounter: Payer: Self-pay | Admitting: Internal Medicine

## 2015-02-09 ENCOUNTER — Ambulatory Visit (INDEPENDENT_AMBULATORY_CARE_PROVIDER_SITE_OTHER): Payer: Medicare Other | Admitting: Pharmacist

## 2015-02-09 DIAGNOSIS — I480 Paroxysmal atrial fibrillation: Secondary | ICD-10-CM

## 2015-02-09 LAB — POCT INR: INR: 2.8

## 2015-02-16 ENCOUNTER — Encounter (HOSPITAL_COMMUNITY): Payer: Self-pay | Admitting: *Deleted

## 2015-02-16 ENCOUNTER — Encounter (HOSPITAL_COMMUNITY)
Admission: RE | Disposition: A | Discharge status: Home / Self Care | Payer: Self-pay | Source: Ambulatory Visit | Attending: Internal Medicine

## 2015-02-16 ENCOUNTER — Ambulatory Visit (HOSPITAL_COMMUNITY)
Admission: RE | Admit: 2015-02-16 | Discharge: 2015-02-16 | Disposition: A | Discharge status: Home / Self Care | Payer: Medicare Other | Source: Ambulatory Visit | Attending: Internal Medicine | Admitting: Internal Medicine

## 2015-02-16 ENCOUNTER — Ambulatory Visit (HOSPITAL_BASED_OUTPATIENT_CLINIC_OR_DEPARTMENT_OTHER)
Admission: RE | Admit: 2015-02-16 | Discharge: 2015-02-16 | Disposition: A | Discharge status: Home / Self Care | Payer: Medicare Other | Source: Ambulatory Visit | Attending: Internal Medicine | Admitting: Internal Medicine

## 2015-02-16 DIAGNOSIS — I4891 Unspecified atrial fibrillation: Secondary | ICD-10-CM | POA: Diagnosis not present

## 2015-02-16 DIAGNOSIS — Z79899 Other long term (current) drug therapy: Secondary | ICD-10-CM | POA: Insufficient documentation

## 2015-02-16 DIAGNOSIS — I340 Nonrheumatic mitral (valve) insufficiency: Secondary | ICD-10-CM | POA: Diagnosis not present

## 2015-02-16 DIAGNOSIS — J449 Chronic obstructive pulmonary disease, unspecified: Secondary | ICD-10-CM | POA: Insufficient documentation

## 2015-02-16 DIAGNOSIS — F418 Other specified anxiety disorders: Secondary | ICD-10-CM | POA: Insufficient documentation

## 2015-02-16 DIAGNOSIS — Z7982 Long term (current) use of aspirin: Secondary | ICD-10-CM | POA: Diagnosis not present

## 2015-02-16 DIAGNOSIS — I480 Paroxysmal atrial fibrillation: Secondary | ICD-10-CM | POA: Insufficient documentation

## 2015-02-16 DIAGNOSIS — I10 Essential (primary) hypertension: Secondary | ICD-10-CM | POA: Insufficient documentation

## 2015-02-16 DIAGNOSIS — Z8249 Family history of ischemic heart disease and other diseases of the circulatory system: Secondary | ICD-10-CM | POA: Insufficient documentation

## 2015-02-16 DIAGNOSIS — E7800 Pure hypercholesterolemia, unspecified: Secondary | ICD-10-CM | POA: Insufficient documentation

## 2015-02-16 HISTORY — PX: TEE WITHOUT CARDIOVERSION: SHX5443

## 2015-02-16 SURGERY — ECHOCARDIOGRAM, TRANSESOPHAGEAL
Anesthesia: Moderate Sedation

## 2015-02-16 MED ORDER — DIPHENHYDRAMINE HCL 50 MG/ML IJ SOLN
INTRAMUSCULAR | Status: AC
  Filled 2015-02-16: qty 1

## 2015-02-16 MED ORDER — FENTANYL CITRATE (PF) 100 MCG/2ML IJ SOLN
INTRAMUSCULAR | Status: DC | PRN
  Administered 2015-02-16 (×2): 25 ug via INTRAVENOUS
  Administered 2015-02-16: 50 ug via INTRAVENOUS

## 2015-02-16 MED ORDER — DIPHENHYDRAMINE HCL 50 MG/ML IJ SOLN
INTRAMUSCULAR | Status: DC | PRN
  Administered 2015-02-16: 25 mg via INTRAVENOUS

## 2015-02-16 MED ORDER — SODIUM CHLORIDE 0.9 % IV SOLN
INTRAVENOUS | Status: DC
  Administered 2015-02-16: 500 mL via INTRAVENOUS

## 2015-02-16 MED ORDER — MIDAZOLAM HCL 10 MG/2ML IJ SOLN
INTRAMUSCULAR | Status: DC | PRN
  Administered 2015-02-16: 1 mg via INTRAVENOUS
  Administered 2015-02-16: 2 mg via INTRAVENOUS
  Administered 2015-02-16 (×3): 1 mg via INTRAVENOUS

## 2015-02-16 MED ORDER — MIDAZOLAM HCL 5 MG/ML IJ SOLN
INTRAMUSCULAR | Status: AC
  Filled 2015-02-16: qty 2

## 2015-02-16 MED ORDER — LIDOCAINE VISCOUS 2 % MT SOLN
OROMUCOSAL | Status: DC | PRN
  Administered 2015-02-16: 10 mL via OROMUCOSAL

## 2015-02-16 MED ORDER — FENTANYL CITRATE (PF) 100 MCG/2ML IJ SOLN
INTRAMUSCULAR | Status: AC
  Filled 2015-02-16: qty 2

## 2015-02-16 MED ORDER — BUTAMBEN-TETRACAINE-BENZOCAINE 2-2-14 % EX AERO
INHALATION_SPRAY | CUTANEOUS | Status: DC | PRN
  Administered 2015-02-16: 2 via TOPICAL

## 2015-02-16 MED ORDER — LIDOCAINE VISCOUS 2 % MT SOLN
OROMUCOSAL | Status: AC
  Filled 2015-02-16: qty 15

## 2015-02-16 NOTE — CV Procedure (Signed)
.     TRANSESOPHAGEAL ECHOCARDIOGRAM (TEE) NOTE  - WATCHMAN ATRIAL APPENDAGE OCCLUDER DEVICE EVALUATION    INDICATIONS: atrial fibrillation  PROCEDURE:   Informed consent was obtained prior to the procedure. The risks, benefits and alternatives for the procedure were discussed and the patient comprehended these risks.  Risks include, but are not limited to, cough, sore throat, vomiting, nausea, somnolence, esophageal and stomach trauma or perforation, bleeding, low blood pressure, aspiration, pneumonia, infection, trauma to the teeth and death.    After a procedural time-out, the patient was given 6 mg versed and 100 mcg fentanyl along with 25 mg IV benadryl for moderate sedation.  The oropharynx was anesthetized 10 cc of topical 1% viscous lidocaine and 2 cetacaine sprays.  The transesophageal probe was inserted in the esophagus and stomach without difficulty and multiple views were obtained.  The patient was kept under observation until the patient left the procedure room.  The patient left the procedure room in stable condition.   Agitated microbubble saline contrast was not administered.  COMPLICATIONS:    There were no immediate complications.  Findings:  1. LEFT VENTRICLE: The left ventricular wall thickness is mildly increased.  The left ventricular cavity is normal in size. Wall motion is normal.  LVEF is 55-60%.  2. RIGHT VENTRICLE:  The right ventricle is normal in structure and function without any thrombus or masses.    3. LEFT ATRIUM:  The left atrium is dilated in size without any thrombus or masses.  There is not spontaneous echo contrast ("smoke") in the left atrium consistent with a low flow state.  4. LEFT ATRIAL APPENDAGE:  The left atrial appendage is noted to have a well-seated 30 mm Watchman occluder device in place. Pulse doppler indicates a small amount of color flow in the inferior aspect of the appendage.    5. ATRIAL SEPTUM:  The atrial septum is noted to have  a small septal defect with intermittent bidirectional color flow.  6. RIGHT ATRIUM:  The right atrium is normal in size and function without any thrombus or masses.  7. MITRAL VALVE:  The mitral valve is normal in structure and function with Mild regurgitation.  There were no vegetations or stenosis.  8. AORTIC VALVE:  The aortic valve is trileaflet, normal in structure and function with no regurgitation.  There were no vegetations or stenosis  9. TRICUSPID VALVE:  The tricuspid valve is normal in structure and function with trivial regurgitation.  There were no vegetations or stenosis  10.  PULMONIC VALVE:  The pulmonic valve is normal in structure and function with no regurgitation.  There were no vegetations or stenosis.   11. AORTIC ARCH, ASCENDING AND DESCENDING AORTA:  There was no Ron Parker et. Al, 1992) atherosclerosis of the ascending aorta, aortic arch, or proximal descending aorta.  12. PULMONARY VEINS: Anomalous pulmonary venous return was not noted.  13. PERICARDIUM: The pericardium appeared normal and non-thickened.  There is no pericardial effusion.  IMPRESSION:   1.  Stable 30 mm Watchman LAA occluder device. The device is well seated and there is a small amount at the inferior aspect of the appendage. No color flow was detected below the device.   RECOMMENDATION:  1. Successful implant of LAA occluder device with minimal leak around the device - suspect this will ultimately thrombose.   Time Spent Directly with the Patient:  45 minutes   Judy Casino, MD, [facility] Attending Cardiologist [facility]  02/16/2015, 9:50 AM

## 2015-02-16 NOTE — H&P (Addendum)
Electrophysiology Office Note Date: 02/17/2015  ID: Judy White, DOB Jul 10, 1940, MRN [id number]  PCP: Henrine Screws, MD Primary Cardiologist: Tamala Julian Electrophysiologist: Allred  CC: 6 week Watchman follow up  Judy White is a 74 y.o. female seen today for Dr Rayann Heman. She presents today for 6 week watchman follow up. TEE yesterday demonstrated well seated Watchman device with minimal leak. Since last being seen in our clinic, the patient reports doing very well. She denies chest pain, palpitations, dyspnea, PND, orthopnea, nausea, vomiting, dizziness, syncope, edema, weight gain, or early satiety. She has tolerated ASA and Warfarin without significant bleeding issues. She has had some bruising.    Past Medical History  Diagnosis Date  . Hypercholesteremia   . Osteopenia   . Depressive disorder, not elsewhere classified   . Anxiety   . Paroxysmal atrial fibrillation (HCC)   . Myalgia and myositis, unspecified   . COPD (chronic obstructive pulmonary disease) ([place])   . Overactive bladder   . Paroxysmal atrial fibrillation (HCC)   . HTN (hypertension)    Past Surgical History  Procedure Laterality Date  . Tonsillectomy    . Appendectomy    . Cesarean section    . Wisdom tooth extraction    . Shoulder surgery    . Cardiac catheterization      >5 yrs  . Breast surgery    . Tee without cardioversion N/A 12/22/2014    Procedure: TRANSESOPHAGEAL ECHOCARDIOGRAM (TEE); Surgeon: Pixie Casino, MD; Location: [facility] ENDOSCOPY; Service: Cardiovascular; Laterality: N/A;  . Electrophysiologic study N/A 12/31/2014    LEFT ATRIAL APPENDAGE OCCLUSION; WATCHMAN implant by Dr Rayann Heman  . Tee without cardioversion N/A 02/16/2015    Procedure: TRANSESOPHAGEAL ECHOCARDIOGRAM (TEE); Surgeon: Pixie Casino, MD; Location: [facility] ENDOSCOPY; Service: Cardiovascular; Laterality: N/A;    Current  Outpatient Prescriptions  Medication Sig Dispense Refill  . acetaminophen (TYLENOL) 500 MG tablet Take 500 mg by mouth every 6 (six) hours as needed (pain).    Marland Kitchen amiodarone (PACERONE) 200 MG tablet Take 200 mg by mouth daily.    . Ascorbic Acid (VITAMIN C PO) Take 500 mg by mouth daily.     Marland Kitchen aspirin 81 MG tablet Take 81 mg by mouth daily.    . Calcium-Vitamin D (CALTRATE 600 PLUS-VIT D PO) Take 1 tablet by mouth daily.     . cholecalciferol (VITAMIN D) 1000 UNITS tablet Take 1,000 Units by mouth daily.    . clonazePAM (KLONOPIN) 1 MG tablet Take 1-1.5 tablets by mouth 2 (two) times daily as needed for anxiety.     . Cyanocobalamin (VITAMIN B-12 PO) Take 1 tablet by mouth daily.     Marland Kitchen diltiazem (CARDIZEM CD) 120 MG 24 hr capsule Take 1 capsule (120 mg total) by mouth daily. 30 capsule 9  . FLUoxetine (PROZAC) 20 MG capsule Take 20 mg by mouth daily.     . furosemide (LASIX) 20 MG tablet Take 1 tablet (20 mg total) by mouth daily. 90 tablet 3  . methocarbamol (ROBAXIN) 500 MG tablet Take 1 tablet (500 mg total) by mouth every 6 (six) hours as needed for muscle spasms. 60 tablet 3  . Omega-3 Fatty Acids (FISH OIL PO) Take 1 tablet by mouth daily.     Marland Kitchen oxybutynin (DITROPAN-XL) 10 MG 24 hr tablet Take 1 tablet by mouth daily.    . Potassium 75 MG TABS Take 1 tablet by mouth daily.    Marland Kitchen warfarin (COUMADIN) 5 MG tablet Take 1 tablet (  5 mg total) by mouth daily. (Patient taking differently: Take 2.5 mg by mouth daily. ) 45 tablet 0     Allergies: Review of patient's allergies indicates no known allergies.   Social History: Social History   Social History  . Marital Status: Married    Spouse Name: N/A  . Number of Children: N/A  . Years of Education: N/A   Occupational History  . Not on file.   Social History Main Topics  . Smoking status: Current Every Day Smoker -- 0.50  packs/day for 50 years  . Smokeless tobacco: Never Used  . Alcohol Use: 0.0 oz/week    0 Standard drinks or equivalent per week     Comment: occ  . Drug Use: No  . Sexual Activity: Not on file   Other Topics Concern  . Not on file   Social History Narrative    Family History: Family History  Problem Relation Age of Onset  . Cancer      maternal side  . Heart Problems      paternal side  . Aneurysm Father   . Hypotension Mother   . Neuropathy Brother   . Heart disease Brother     Review of Systems: All other systems reviewed and are otherwise negative except as noted above.   Physical Exam: VS: BP 100/58 mmHg  Pulse 60  Ht 5\' 6"  (1.676 m)  Wt 177 lb 6.4 oz (80.468 kg)  BMI 28.65 kg/m2 , BMI Body mass index is 28.65 kg/(m^2). Wt Readings from Last 3 Encounters:  02/17/15 177 lb 6.4 oz (80.468 kg)  01/11/15 182 lb (82.555 kg)  12/31/14 175 lb (79.379 kg)   GEN- The patient is well appearing, alert and oriented x 3 today.  HEENT: normocephalic, atraumatic; sclera clear, conjunctiva pink; hearing intact; oropharynx clear; neck supple  Lungs- Clear to ausculation bilaterally, normal work of breathing. No wheezes, rales, rhonchi Heart- Regular rate and rhythm, no murmurs, rubs or gallops  GI- soft, non-tender, non-distended, bowel sounds present  Extremities- no clubbing, cyanosis, or edema; DP/PT/radial pulses 2+ bilaterally MS- no significant deformity or atrophy Skin- warm and dry, no rash or lesion  Psych- euthymic mood, full affect Neuro- strength and sensation are intact   EKG: EKG is not ordered today.  Recent Labs: 08/25/2014: Pro B Natriuretic peptide (BNP) 136.0* 12/31/2014: Hemoglobin 14.5; Platelets 206 01/01/2015: BUN 17; Creatinine, Ser 1.06*; Potassium 4.0; Sodium 141    Other studies Reviewed: Additional studies/ records that were reviewed today include: recent  TEE  Assessment and Plan: 1. Paroxysmal atrial fibrillation Maintaining SR by symptoms S/p Watchman implant 12/31/14  TEE 12/20 with well seated Watchman and 1.52mm jet around device Discontinue Warfarin Continue ASA/Plavix until 6 months post procedure (06/2014) then discontinue Plavix  2. HTN Stable No change required today   Current medicines are reviewed at length with the patient today.  The patient does not have concerns regarding her medicines. The following changes were made today: Discontinue Warfarin, start Plavix 75mg  daily  Labs/ tests ordered today include: CBC, BMET   Disposition: Follow up with Dr Tamala Julian as scheduled, me in 06/2014   Signed, [name], NP 02/17/2015 8:10 AM   [facility] [address] [place] [address] [phone] (office) [phone] (fax)            History and Physical Interval Note:  02/16/2015 8:19 AM  Leonia Corona Bagnell has presented today for their planned procedure. The various methods of treatment have  been discussed with the patient and family. After consideration of risks, benefits and other options for treatment, the patient has consented to the procedure.  There have been no interval changes since the above office note. I have examined the patient and discussed the repeat TEE in detail.  The patients' outpatient history has been reviewed, patient examined, and no change in status from most recent office note within the past 30 days. I have reviewed the patients' chart and labs and will proceed as planned. Questions were answered to the patient's satisfaction.   Pixie Casino, MD, [facility] Attending Cardiologist Linden C Talor Cheema 02/16/2015, 8:19 AM

## 2015-02-16 NOTE — Progress Notes (Signed)
Electrophysiology Office Note Date: 02/17/2015  ID:  Judy White, DOB [birth year], MRN [id number]  PCP: Henrine Screws, MD Primary Cardiologist: Tamala Julian Electrophysiologist: Allred  CC: 6 week Watchman follow up  Judy White is a 74 y.o. female seen today for Dr Rayann Heman. She presents today for 6 week watchman follow up. TEE yesterday demonstrated well seated Watchman device with minimal leak. Since last being seen in our clinic, the patient reports doing very well.  She denies chest pain, palpitations, dyspnea, PND, orthopnea, nausea, vomiting, dizziness, syncope, edema, weight gain, or early satiety.  She has tolerated ASA and Warfarin without significant bleeding issues. She has had some bruising.     Past Medical History  Diagnosis Date  . Hypercholesteremia   . Osteopenia   . Depressive disorder, not elsewhere classified   . Anxiety   . Paroxysmal atrial fibrillation (HCC)   . Myalgia and myositis, unspecified   . COPD (chronic obstructive pulmonary disease) ([place])   . Overactive bladder   . Paroxysmal atrial fibrillation (HCC)   . HTN (hypertension)    Past Surgical History  Procedure Laterality Date  . Tonsillectomy    . Appendectomy    . Cesarean section    . Wisdom tooth extraction    . Shoulder surgery    . Cardiac catheterization      >5 yrs  . Breast surgery    . Tee without cardioversion N/A 12/22/2014    Procedure: TRANSESOPHAGEAL ECHOCARDIOGRAM (TEE);  Surgeon: Pixie Casino, MD;  Location: [facility] ENDOSCOPY;  Service: Cardiovascular;  Laterality: N/A;  . Electrophysiologic study N/A 12/31/2014    LEFT ATRIAL APPENDAGE OCCLUSION;  WATCHMAN implant by Dr Rayann Heman  . Tee without cardioversion N/A 02/16/2015    Procedure: TRANSESOPHAGEAL ECHOCARDIOGRAM (TEE);  Surgeon: Pixie Casino, MD;  Location: [facility] ENDOSCOPY;  Service: Cardiovascular;  Laterality: N/A;    Current Outpatient Prescriptions  Medication Sig Dispense Refill  . acetaminophen (TYLENOL) 500 MG  tablet Take 500 mg by mouth every 6 (six) hours as needed (pain).    Marland Kitchen amiodarone (PACERONE) 200 MG tablet Take 200 mg by mouth daily.    . Ascorbic Acid (VITAMIN C PO) Take 500 mg by mouth daily.     Marland Kitchen aspirin 81 MG tablet Take 81 mg by mouth daily.    . Calcium-Vitamin D (CALTRATE 600 PLUS-VIT D PO) Take 1 tablet by mouth daily.     . cholecalciferol (VITAMIN D) 1000 UNITS tablet Take 1,000 Units by mouth daily.    . clonazePAM (KLONOPIN) 1 MG tablet Take 1-1.5 tablets by mouth 2 (two) times daily as needed for anxiety.     . Cyanocobalamin (VITAMIN B-12 PO) Take 1 tablet by mouth daily.     Marland Kitchen diltiazem (CARDIZEM CD) 120 MG 24 hr capsule Take 1 capsule (120 mg total) by mouth daily. 30 capsule 9  . FLUoxetine (PROZAC) 20 MG capsule Take 20 mg by mouth daily.     . furosemide (LASIX) 20 MG tablet Take 1 tablet (20 mg total) by mouth daily. 90 tablet 3  . methocarbamol (ROBAXIN) 500 MG tablet Take 1 tablet (500 mg total) by mouth every 6 (six) hours as needed for muscle spasms. 60 tablet 3  . Omega-3 Fatty Acids (FISH OIL PO) Take 1 tablet by mouth daily.     Marland Kitchen oxybutynin (DITROPAN-XL) 10 MG 24 hr tablet Take 1 tablet by mouth daily.    . Potassium 75 MG TABS Take 1 tablet by mouth daily.    Marland Kitchen  warfarin (COUMADIN) 5 MG tablet Take 1 tablet (5 mg total) by mouth daily. (Patient taking differently: Take 2.5 mg by mouth daily. ) 45 tablet 0     Allergies:   Review of patient's allergies indicates no known allergies.   Social History: Social History   Social History  . Marital Status: Married    Spouse Name: N/A  . Number of Children: N/A  . Years of Education: N/A   Occupational History  . Not on file.   Social History Main Topics  . Smoking status: Current Every Day Smoker -- 0.50 packs/day for 50 years  . Smokeless tobacco: Never Used  . Alcohol Use: 0.0 oz/week    0 Standard drinks or equivalent per week     Comment: occ  . Drug Use: No  . Sexual Activity: Not on file   Other  Topics Concern  . Not on file   Social History Narrative    Family History: Family History  Problem Relation Age of Onset  . Cancer      maternal side  . Heart Problems      paternal side  . Aneurysm Father   . Hypotension Mother   . Neuropathy Brother   . Heart disease Brother     Review of Systems: All other systems reviewed and are otherwise negative except as noted above.   Physical Exam: VS:  BP 100/58 mmHg  Pulse 60  Ht 5\' 6"  (1.676 m)  Wt 177 lb 6.4 oz (80.468 kg)  BMI 28.65 kg/m2 , BMI Body mass index is 28.65 kg/(m^2). Wt Readings from Last 3 Encounters:  02/17/15 177 lb 6.4 oz (80.468 kg)  01/11/15 182 lb (82.555 kg)  12/31/14 175 lb (79.379 kg)   GEN- The patient is well appearing, alert and oriented x 3 today.  HEENT: normocephalic, atraumatic; sclera clear, conjunctiva pink; hearing intact; oropharynx clear; neck supple  Lungs- Clear to ausculation bilaterally, normal work of breathing. No wheezes, rales, rhonchi Heart- Regular rate and rhythm, no murmurs, rubs or gallops  GI- soft, non-tender, non-distended, bowel sounds present  Extremities- no clubbing, cyanosis, or edema; DP/PT/radial pulses 2+ bilaterally MS- no significant deformity or atrophy Skin- warm and dry, no rash or lesion  Psych- euthymic mood, full affect Neuro- strength and sensation are intact   EKG:  EKG is not ordered today.  Recent Labs: 08/25/2014: Pro B Natriuretic peptide (BNP) 136.0* 12/31/2014: Hemoglobin 14.5; Platelets 206 01/01/2015: BUN 17; Creatinine, Ser 1.06*; Potassium 4.0; Sodium 141    Other studies Reviewed: Additional studies/ records that were reviewed today include: recent TEE  Assessment and Plan: 1. Paroxysmal atrial fibrillation Maintaining SR by symptoms S/p Watchman implant 12/31/14  TEE 12/20 with well seated Watchman and 1.81mm jet around device Discontinue Warfarin Continue ASA/Plavix until 6 months post procedure (06/2014) then discontinue  Plavix  2. HTN Stable No change required today   Current medicines are reviewed at length with the patient today.   The patient does not have concerns regarding her medicines.  The following changes were made today:  Discontinue Warfarin, start Plavix 75mg  daily  Labs/ tests ordered today include: CBC, BMET   Disposition:   Follow up with Dr Tamala Julian as scheduled, me in 06/2014   Signed, [name], NP 02/17/2015 8:10 AM   [facility] [address] [place] [address] [phone] (office) [phone] (fax)

## 2015-02-16 NOTE — Progress Notes (Signed)
Echocardiogram Echocardiogram Transesophageal has been performed.  Joelene Millin 02/16/2015, 10:00 AM

## 2015-02-16 NOTE — Discharge Instructions (Signed)

## 2015-02-17 ENCOUNTER — Encounter: Payer: Self-pay | Admitting: Nurse Practitioner

## 2015-02-17 ENCOUNTER — Ambulatory Visit (INDEPENDENT_AMBULATORY_CARE_PROVIDER_SITE_OTHER): Payer: Medicare Other | Admitting: Nurse Practitioner

## 2015-02-17 VITALS — BP 100/58 | HR 60 | Ht 66.0 in | Wt 177.4 lb

## 2015-02-17 DIAGNOSIS — I4891 Unspecified atrial fibrillation: Secondary | ICD-10-CM | POA: Diagnosis not present

## 2015-02-17 DIAGNOSIS — I480 Paroxysmal atrial fibrillation: Secondary | ICD-10-CM | POA: Diagnosis not present

## 2015-02-17 DIAGNOSIS — I10 Essential (primary) hypertension: Secondary | ICD-10-CM | POA: Diagnosis not present

## 2015-02-17 LAB — BASIC METABOLIC PANEL
BUN: 18 mg/dL (ref 7–25)
CHLORIDE: 104 mmol/L (ref 98–110)
CO2: 28 mmol/L (ref 20–31)
CREATININE: 1 mg/dL — AB (ref 0.60–0.93)
Calcium: 8.7 mg/dL (ref 8.6–10.4)
GLUCOSE: 88 mg/dL (ref 65–99)
POTASSIUM: 3.7 mmol/L (ref 3.5–5.3)
Sodium: 142 mmol/L (ref 135–146)

## 2015-02-17 LAB — CBC
HEMATOCRIT: 44.3 % (ref 36.0–46.0)
HEMOGLOBIN: 14.4 g/dL (ref 12.0–15.0)
MCH: 29.4 pg (ref 26.0–34.0)
MCHC: 32.5 g/dL (ref 30.0–36.0)
MCV: 90.4 fL (ref 78.0–100.0)
MPV: 10.2 fL (ref 8.6–12.4)
Platelets: 217 10*3/uL (ref 150–400)
RBC: 4.9 MIL/uL (ref 3.87–5.11)
RDW: 14.4 % (ref 11.5–15.5)
WBC: 7 10*3/uL (ref 4.0–10.5)

## 2015-02-17 MED ORDER — CLOPIDOGREL BISULFATE 75 MG PO TABS: 75.0000 mg | ORAL_TABLET | Freq: Every day | ORAL | Status: DC

## 2015-02-17 NOTE — Patient Instructions (Addendum)
Medication Instructions:   STOP COUMADIN  START PLAVIX 51 MGM ONCE A DAY     If you need a refill on your cardiac medications before your next appointment, please call your pharmacy.  Labwork: CBC  AND BMET    Testing/Procedures: NONE ORDER TODAY    Follow-Up:  Your physician wants you to follow-up in: 5- [place] will receive a reminder letter in the mail two months in advance. If you don't receive a letter, please call our office to schedule the follow-up appointment.     Any Other Special Instructions Will Be Listed Below (If Applicable).

## 2015-02-18 ENCOUNTER — Telehealth: Payer: Self-pay | Admitting: *Deleted

## 2015-02-18 NOTE — Telephone Encounter (Signed)
-----   Message from Patsey Berthold, NP sent at 02/17/2015  3:35 PM EST ----- Please notify patient of stable labs.

## 2015-02-19 ENCOUNTER — Telehealth: Payer: Self-pay | Admitting: *Deleted

## 2015-02-19 NOTE — Telephone Encounter (Signed)
-----   Message from Patsey Berthold, NP sent at 02/17/2015  3:35 PM EST ----- Please notify patient of stable labs.

## 2015-02-23 ENCOUNTER — Ambulatory Visit: Payer: Self-pay | Admitting: Cardiology

## 2015-02-23 DIAGNOSIS — I480 Paroxysmal atrial fibrillation: Secondary | ICD-10-CM

## 2015-02-24 DIAGNOSIS — Z6828 Body mass index (BMI) 28.0-28.9, adult: Secondary | ICD-10-CM | POA: Diagnosis not present

## 2015-04-30 DIAGNOSIS — D1801 Hemangioma of skin and subcutaneous tissue: Secondary | ICD-10-CM | POA: Diagnosis not present

## 2015-04-30 DIAGNOSIS — L821 Other seborrheic keratosis: Secondary | ICD-10-CM | POA: Diagnosis not present

## 2015-04-30 DIAGNOSIS — L814 Other melanin hyperpigmentation: Secondary | ICD-10-CM | POA: Diagnosis not present

## 2015-04-30 DIAGNOSIS — D225 Melanocytic nevi of trunk: Secondary | ICD-10-CM | POA: Diagnosis not present

## 2015-04-30 DIAGNOSIS — L820 Inflamed seborrheic keratosis: Secondary | ICD-10-CM | POA: Diagnosis not present

## 2015-05-10 ENCOUNTER — Other Ambulatory Visit: Payer: Self-pay | Admitting: Interventional Cardiology

## 2015-05-11 ENCOUNTER — Telehealth: Payer: Self-pay | Admitting: Nurse Practitioner

## 2015-05-11 NOTE — Telephone Encounter (Signed)
New Message:  Pt wants to know if she can stop her Plavix?

## 2015-05-11 NOTE — Telephone Encounter (Signed)
Spoke with pt and she states that she had a blood blister come up on her face over the weekend and she has had one in her mouth before. Advised pt that looking at Amber's note that the plan was for her to be on ASA/Plavix for 6 months post Watchman. Pt states that when she was on the Coumadin she had a hemorrhage and was told if she had any trouble with the Plavix to let us know. Advised pt that I will send this message to [name], NP for review and advisement.

## 2015-05-13 NOTE — Telephone Encounter (Signed)
Would continue ASA and Plavix and monitor for now. If not resolved or if recurring more frequently, should come in for office visit to discuss. [name], NP 05/13/2015 11:20 AM

## 2015-05-13 NOTE — Telephone Encounter (Signed)
Spoke to pt and informed her of recommendations per [employer]. Pt verbalized understanding and was in agreement with this plan.

## 2015-06-10 DIAGNOSIS — F3341 Major depressive disorder, recurrent, in partial remission: Secondary | ICD-10-CM | POA: Diagnosis not present

## 2015-06-29 ENCOUNTER — Encounter: Payer: Self-pay | Admitting: Nurse Practitioner

## 2015-06-29 NOTE — Progress Notes (Signed)
Electrophysiology Office Note Date: 06/30/2015  ID:  Judy White, DOB [birth year], MRN [id number]  PCP: Henrine Screws, MD Primary Cardiologist: Tamala Julian Electrophysiologist: Allred  CC: 6 month Watchman follow up  Judy White is a 75 y.o. female seen today for Dr Rayann Heman. She presents today for 6 month watchman follow up. Since last being seen in our clinic, the patient reports doing very well.  She denies chest pain, palpitations, dyspnea, PND, orthopnea, nausea, vomiting, dizziness, syncope, weight gain, or early satiety.  She has tolerated ASA and Plavix without significant bleeding issues.  She has had increased LE edema recently and back pain.   Watchman: 12/31/14 Device implant 02/16/15 TEE with good seal and no leak    Past Medical History  Diagnosis Date  . Hypercholesteremia   . Osteopenia   . Depressive disorder, not elsewhere classified   . Anxiety   . Myalgia and myositis, unspecified   . COPD (chronic obstructive pulmonary disease) ([place])   . Overactive bladder   . Paroxysmal atrial fibrillation (HCC)     a. s/p Watchman implant 12/2014  . HTN (hypertension)    Past Surgical History  Procedure Laterality Date  . Tonsillectomy    . Appendectomy    . Cesarean section    . Wisdom tooth extraction    . Shoulder surgery    . Cardiac catheterization      >5 yrs  . Breast surgery    . Tee without cardioversion N/A 12/22/2014    Procedure: TRANSESOPHAGEAL ECHOCARDIOGRAM (TEE);  Surgeon: Pixie Casino, MD;  Location: [facility] ENDOSCOPY;  Service: Cardiovascular;  Laterality: N/A;  . Electrophysiologic study N/A 12/31/2014    Watchman implant with Dr Rayann Heman and Dr Burt Knack  . Tee without cardioversion N/A 02/16/2015    post Watchman TEE with good seal and no leak around device     Current outpatient prescriptions:  .  acetaminophen (TYLENOL) 500 MG tablet, Take 500 mg by mouth every 6 (six) hours as needed (pain)., Disp: , Rfl:  .  amiodarone (PACERONE) 200 MG tablet,  Take 200 mg by mouth daily., Disp: , Rfl:  .  Ascorbic Acid (VITAMIN C PO), Take 500 mg by mouth daily. , Disp: , Rfl:  .  aspirin 81 MG tablet, Take 81 mg by mouth daily., Disp: , Rfl:  .  Calcium-Vitamin D (CALTRATE 600 PLUS-VIT D PO), Take 1 tablet by mouth daily. , Disp: , Rfl:  .  cholecalciferol (VITAMIN D) 1000 UNITS tablet, Take 1,000 Units by mouth daily., Disp: , Rfl:  .  clonazePAM (KLONOPIN) 1 MG tablet, Take 1.5-2 tablets by mouth daily. , Disp: , Rfl:  .  Cyanocobalamin (VITAMIN B-12 PO), Take 1 tablet by mouth daily. , Disp: , Rfl:  .  diltiazem (CARDIZEM CD) 120 MG 24 hr capsule, Take 1 capsule (120 mg total) by mouth daily., Disp: 30 capsule, Rfl: 9 .  FLUoxetine (PROZAC) 20 MG capsule, Take 20 mg by mouth daily. , Disp: , Rfl:  .  furosemide (LASIX) 20 MG tablet, Take 1 tablet (20 mg total) by mouth daily., Disp: 90 tablet, Rfl: 3 .  methocarbamol (ROBAXIN) 500 MG tablet, Take 1 tablet (500 mg total) by mouth every 6 (six) hours as needed for muscle spasms., Disp: 60 tablet, Rfl: 3 .  Omega-3 Fatty Acids (FISH OIL PO), Take 1 tablet by mouth daily. , Disp: , Rfl:  .  oxybutynin (DITROPAN-XL) 10 MG 24 hr tablet, Take 1 tablet by mouth daily.,  Disp: , Rfl:  .  Potassium 75 MG TABS, Take 1 tablet by mouth daily., Disp: , Rfl:   Allergies:   Review of patient's allergies indicates no known allergies.   Social History: Social History   Social History  . Marital Status: Married    Spouse Name: N/A  . Number of Children: N/A  . Years of Education: N/A   Occupational History  . Not on file.   Social History Main Topics  . Smoking status: Current Every Day Smoker -- 0.50 packs/day for 50 years  . Smokeless tobacco: Never Used  . Alcohol Use: 0.0 oz/week    0 Standard drinks or equivalent per week     Comment: occ  . Drug Use: No  . Sexual Activity: Not on file   Other Topics Concern  . Not on file   Social History Narrative    Family History: Family History    Problem Relation Age of Onset  . Cancer      maternal side  . Heart Problems      paternal side  . Aneurysm Father   . Hypotension Mother   . Neuropathy Brother   . Heart disease Brother     Review of Systems: All other systems reviewed and are otherwise negative except as noted above.   Physical Exam: VS:  BP 115/68 mmHg  Pulse 50  Ht 5\' 6"  (1.676 m)  Wt 179 lb 3.2 oz (81.285 kg)  BMI 28.94 kg/m2 , BMI Body mass index is 28.94 kg/(m^2). Wt Readings from Last 3 Encounters:  06/30/15 179 lb 3.2 oz (81.285 kg)  02/17/15 177 lb 6.4 oz (80.468 kg)  01/11/15 182 lb (82.555 kg)   GEN- The patient is well appearing, alert and oriented x 3 today.  HEENT: normocephalic, atraumatic; sclera clear, conjunctiva pink; hearing intact; oropharynx clear; neck supple  Lungs- Clear to ausculation bilaterally, normal work of breathing. No wheezes, rales, rhonchi Heart- Regular rate and rhythm, no murmurs, rubs or gallops  GI- soft, non-tender, non-distended, bowel sounds present  Extremities- no clubbing, cyanosis, 1+ LE edema L>R edema; DP/PT/radial pulses 2+ bilaterally MS- no significant deformity or atrophy Skin- warm and dry, no rash or lesion  Psych- euthymic mood, full affect Neuro- strength and sensation are intact   EKG:  EKG is not ordered today.  Recent Labs: 08/25/2014: Pro B Natriuretic peptide (BNP) 136.0* 02/17/2015: BUN 18; Creat 1.00*; Hemoglobin 14.4; Platelets 217; Potassium 3.7; Sodium 142    Assessment and Plan: 1. Paroxysmal atrial fibrillation Maintaining SR by symptoms Continue Amiodarone (TSH, LFT's at next office visit, annual eye exams recommended) CHADS2VASC is 4, s/p Watchman implant 12/31/14  Discontinue Plavix at this time (6 months post procedure) Continue life-long ASA 325mg  daily Discussed with patient today  2. HTN Stable No change required today   Current medicines are reviewed at length with the patient today.   The patient does  not have concerns regarding her medicines.  The following changes were made today:  Discontinue Plavix, continue ASA 325mg  daily   Labs/ tests ordered today include:none  Disposition:   Follow up with Dr Tamala Julian in 6 months, Dr Rayann Heman 1 year (sooner if needed for AF management)   Signed, [name], NP 06/30/2015 11:22 AM   [facility] [address] [place] [address] [phone] (office) [phone] (fax)

## 2015-06-30 ENCOUNTER — Ambulatory Visit (INDEPENDENT_AMBULATORY_CARE_PROVIDER_SITE_OTHER): Payer: Medicare Other | Admitting: Nurse Practitioner

## 2015-06-30 ENCOUNTER — Encounter: Payer: Self-pay | Admitting: Nurse Practitioner

## 2015-06-30 VITALS — BP 115/68 | HR 50 | Ht 66.0 in | Wt 179.2 lb

## 2015-06-30 DIAGNOSIS — I10 Essential (primary) hypertension: Secondary | ICD-10-CM

## 2015-06-30 DIAGNOSIS — I480 Paroxysmal atrial fibrillation: Secondary | ICD-10-CM

## 2015-06-30 NOTE — Patient Instructions (Signed)
Medication Instructions:   STOP TAKING PLAVIX   If you need a refill on your cardiac medications before your next appointment, please call your pharmacy.  Labwork:  NONE ORDER TODAY    Testing/Procedures: NONE ORDER TODAY   Follow-Up:  Your physician wants you to follow-up in:  [place]  [place] will receive a reminder letter in the mail two months in advance. If you don't receive a letter, please call our office to schedule the follow-up appointment.  Your physician wants you to follow-up in: [place] will receive a reminder letter in the mail two months in advance. If you don't receive a letter, please call our office to schedule the follow-up appointment.     Any Other Special Instructions Will Be Listed Below (If Applicable).

## 2015-07-06 DIAGNOSIS — Z79899 Other long term (current) drug therapy: Secondary | ICD-10-CM | POA: Diagnosis not present

## 2015-07-06 DIAGNOSIS — Z6829 Body mass index (BMI) 29.0-29.9, adult: Secondary | ICD-10-CM | POA: Diagnosis not present

## 2015-07-06 DIAGNOSIS — I10 Essential (primary) hypertension: Secondary | ICD-10-CM | POA: Diagnosis not present

## 2015-07-06 DIAGNOSIS — I480 Paroxysmal atrial fibrillation: Secondary | ICD-10-CM | POA: Diagnosis not present

## 2015-07-06 DIAGNOSIS — E559 Vitamin D deficiency, unspecified: Secondary | ICD-10-CM | POA: Diagnosis not present

## 2015-07-06 DIAGNOSIS — E782 Mixed hyperlipidemia: Secondary | ICD-10-CM | POA: Diagnosis not present

## 2015-07-06 DIAGNOSIS — E669 Obesity, unspecified: Secondary | ICD-10-CM | POA: Diagnosis not present

## 2015-07-06 DIAGNOSIS — Z1389 Encounter for screening for other disorder: Secondary | ICD-10-CM | POA: Diagnosis not present

## 2015-07-06 DIAGNOSIS — Z0001 Encounter for general adult medical examination with abnormal findings: Secondary | ICD-10-CM | POA: Diagnosis not present

## 2015-07-11 ENCOUNTER — Other Ambulatory Visit: Payer: Self-pay | Admitting: Interventional Cardiology

## 2015-08-20 ENCOUNTER — Encounter (HOSPITAL_COMMUNITY): Payer: Self-pay | Admitting: Internal Medicine

## 2015-08-27 DIAGNOSIS — M4806 Spinal stenosis, lumbar region: Secondary | ICD-10-CM | POA: Diagnosis not present

## 2015-08-27 DIAGNOSIS — M4726 Other spondylosis with radiculopathy, lumbar region: Secondary | ICD-10-CM | POA: Diagnosis not present

## 2015-08-27 DIAGNOSIS — M5416 Radiculopathy, lumbar region: Secondary | ICD-10-CM | POA: Diagnosis not present

## 2015-08-27 DIAGNOSIS — M5136 Other intervertebral disc degeneration, lumbar region: Secondary | ICD-10-CM | POA: Diagnosis not present

## 2015-09-03 DIAGNOSIS — H04123 Dry eye syndrome of bilateral lacrimal glands: Secondary | ICD-10-CM | POA: Diagnosis not present

## 2015-09-29 DIAGNOSIS — Z961 Presence of intraocular lens: Secondary | ICD-10-CM | POA: Diagnosis not present

## 2015-09-29 DIAGNOSIS — H52203 Unspecified astigmatism, bilateral: Secondary | ICD-10-CM | POA: Diagnosis not present

## 2015-10-07 ENCOUNTER — Inpatient Hospital Stay (HOSPITAL_COMMUNITY)
Admission: EM | Admit: 2015-10-07 | Discharge: 2015-10-12 | DRG: 291 | Disposition: A | Discharge status: Home / Self Care | Payer: Medicare Other | Attending: Internal Medicine | Admitting: Internal Medicine

## 2015-10-07 ENCOUNTER — Telehealth: Payer: Self-pay | Admitting: Interventional Cardiology

## 2015-10-07 ENCOUNTER — Encounter (HOSPITAL_COMMUNITY): Payer: Self-pay | Admitting: Emergency Medicine

## 2015-10-07 ENCOUNTER — Emergency Department (HOSPITAL_COMMUNITY): Payer: Medicare Other

## 2015-10-07 DIAGNOSIS — R0602 Shortness of breath: Secondary | ICD-10-CM | POA: Diagnosis not present

## 2015-10-07 DIAGNOSIS — I82442 Acute embolism and thrombosis of left tibial vein: Secondary | ICD-10-CM | POA: Diagnosis present

## 2015-10-07 DIAGNOSIS — I2699 Other pulmonary embolism without acute cor pulmonale: Secondary | ICD-10-CM

## 2015-10-07 DIAGNOSIS — I480 Paroxysmal atrial fibrillation: Secondary | ICD-10-CM | POA: Diagnosis present

## 2015-10-07 DIAGNOSIS — J811 Chronic pulmonary edema: Secondary | ICD-10-CM | POA: Diagnosis present

## 2015-10-07 DIAGNOSIS — R9389 Abnormal findings on diagnostic imaging of other specified body structures: Secondary | ICD-10-CM

## 2015-10-07 DIAGNOSIS — J449 Chronic obstructive pulmonary disease, unspecified: Secondary | ICD-10-CM | POA: Diagnosis present

## 2015-10-07 DIAGNOSIS — R079 Chest pain, unspecified: Secondary | ICD-10-CM | POA: Diagnosis not present

## 2015-10-07 DIAGNOSIS — F329 Major depressive disorder, single episode, unspecified: Secondary | ICD-10-CM | POA: Diagnosis present

## 2015-10-07 DIAGNOSIS — N3281 Overactive bladder: Secondary | ICD-10-CM | POA: Diagnosis present

## 2015-10-07 DIAGNOSIS — T462X5A Adverse effect of other antidysrhythmic drugs, initial encounter: Secondary | ICD-10-CM | POA: Diagnosis present

## 2015-10-07 DIAGNOSIS — J849 Interstitial pulmonary disease, unspecified: Secondary | ICD-10-CM | POA: Diagnosis not present

## 2015-10-07 DIAGNOSIS — J810 Acute pulmonary edema: Secondary | ICD-10-CM

## 2015-10-07 DIAGNOSIS — J9601 Acute respiratory failure with hypoxia: Secondary | ICD-10-CM | POA: Diagnosis present

## 2015-10-07 DIAGNOSIS — I82432 Acute embolism and thrombosis of left popliteal vein: Secondary | ICD-10-CM | POA: Diagnosis present

## 2015-10-07 DIAGNOSIS — F17210 Nicotine dependence, cigarettes, uncomplicated: Secondary | ICD-10-CM | POA: Diagnosis present

## 2015-10-07 DIAGNOSIS — I4892 Unspecified atrial flutter: Secondary | ICD-10-CM | POA: Diagnosis not present

## 2015-10-07 DIAGNOSIS — I5033 Acute on chronic diastolic (congestive) heart failure: Secondary | ICD-10-CM | POA: Diagnosis present

## 2015-10-07 DIAGNOSIS — Z79899 Other long term (current) drug therapy: Secondary | ICD-10-CM

## 2015-10-07 DIAGNOSIS — M8580 Other specified disorders of bone density and structure, unspecified site: Secondary | ICD-10-CM | POA: Diagnosis present

## 2015-10-07 DIAGNOSIS — I4891 Unspecified atrial fibrillation: Secondary | ICD-10-CM | POA: Diagnosis not present

## 2015-10-07 DIAGNOSIS — R0789 Other chest pain: Secondary | ICD-10-CM | POA: Diagnosis not present

## 2015-10-07 DIAGNOSIS — I110 Hypertensive heart disease with heart failure: Secondary | ICD-10-CM | POA: Diagnosis present

## 2015-10-07 DIAGNOSIS — R51 Headache: Secondary | ICD-10-CM | POA: Diagnosis not present

## 2015-10-07 DIAGNOSIS — Z7982 Long term (current) use of aspirin: Secondary | ICD-10-CM

## 2015-10-07 DIAGNOSIS — Z8673 Personal history of transient ischemic attack (TIA), and cerebral infarction without residual deficits: Secondary | ICD-10-CM

## 2015-10-07 HISTORY — DX: Agoraphobia, unspecified: F40.00

## 2015-10-07 HISTORY — DX: Essential (primary) hypertension: I10

## 2015-10-07 HISTORY — DX: Unspecified osteoarthritis, unspecified site: M19.90

## 2015-10-07 LAB — CBC WITH DIFFERENTIAL/PLATELET
BASOS ABS: 0.1 10*3/uL (ref 0.0–0.1)
BASOS PCT: 0 %
EOS ABS: 0.1 10*3/uL (ref 0.0–0.7)
EOS PCT: 1 %
HCT: 46.1 % — ABNORMAL HIGH (ref 36.0–46.0)
HEMOGLOBIN: 15.6 g/dL — AB (ref 12.0–15.0)
LYMPHS ABS: 1 10*3/uL (ref 0.7–4.0)
Lymphocytes Relative: 7 %
MCH: 30.5 pg (ref 26.0–34.0)
MCHC: 33.8 g/dL (ref 30.0–36.0)
MCV: 90 fL (ref 78.0–100.0)
Monocytes Absolute: 1 10*3/uL (ref 0.1–1.0)
Monocytes Relative: 7 %
NEUTROS PCT: 85 %
Neutro Abs: 12 10*3/uL — ABNORMAL HIGH (ref 1.7–7.7)
PLATELETS: 294 10*3/uL (ref 150–400)
RBC: 5.12 MIL/uL — AB (ref 3.87–5.11)
RDW: 13 % (ref 11.5–15.5)
WBC: 14.1 10*3/uL — AB (ref 4.0–10.5)

## 2015-10-07 LAB — I-STAT TROPONIN, ED: Troponin i, poc: 0 ng/mL (ref 0.00–0.08)

## 2015-10-07 LAB — COMPREHENSIVE METABOLIC PANEL
ALBUMIN: 2.8 g/dL — AB (ref 3.5–5.0)
ALT: 20 U/L (ref 14–54)
AST: 19 U/L (ref 15–41)
Alkaline Phosphatase: 77 U/L (ref 38–126)
Anion gap: 9 (ref 5–15)
BUN: 13 mg/dL (ref 6–20)
CHLORIDE: 100 mmol/L — AB (ref 101–111)
CO2: 24 mmol/L (ref 22–32)
CREATININE: 0.89 mg/dL (ref 0.44–1.00)
Calcium: 8.8 mg/dL — ABNORMAL LOW (ref 8.9–10.3)
GFR calc non Af Amer: >60 mL/min (ref >60)
GLUCOSE: 127 mg/dL — AB (ref 65–99)
Potassium: 3.8 mmol/L (ref 3.5–5.1)
SODIUM: 133 mmol/L — AB (ref 135–145)
Total Bilirubin: 0.5 mg/dL (ref 0.3–1.2)
Total Protein: 6.9 g/dL (ref 6.5–8.1)

## 2015-10-07 NOTE — ED Triage Notes (Signed)
PT st's she has had chest pressure off and on x's 2 weeks.  St's today while at work she had a near syncopal episode.  Pt also st's she has hx of hypotension.  Pt st's at this time she feels dizzy

## 2015-10-07 NOTE — Telephone Encounter (Signed)
Pt currently in ER for evaluation.  [name], NP 10/07/2015 6:48 PM

## 2015-10-07 NOTE — Telephone Encounter (Signed)
New message     Pt husband is calling for rn to return call about his wife   Pt c/o BP issue: STAT if pt c/o blurred vision, one-sided weakness or slurred speech  1. What are your last 5 BP readings? 171/96  Hr 96  2. Are you having any other symptoms (ex. Dizziness, headache, blurred vision, passed out)? Dizzy, nauseated, and she feels like she is going to pass out so she sat down   3. What is your BP issue? high

## 2015-10-07 NOTE — Telephone Encounter (Signed)
Attempted to call patient back to discuss her s/s but she refused to get on the phone.  She stated she doesn't "even feel like talking."  Per husband pt has had an increased HR for the last 5 to 6 days.  Her HR has been 90 to 100 bpm.  He had to pick her up earlier today b/c she didn't feel well.  When he checked her VS at home her BP was 121/96 and HR was 96 bpm.  At last check it was 112/96 HR 94.  The battery was recently changed in the cuff about one month ago.  Whenever she gets up she becomes SOB and dizzy.  She has dizziness when changing positions and nothing makes it better.  She denies any other s/s per husband's report.  She has a HX of HTN and chronic At Fib.  She is taking medications as listed and denies any new medications since she was seen here last.  Advised to call her PCP for further evaluation but I will send this information to Dr. Tamala Julian for his review as well as [name], NP as she has seen her most recently.  Aware we will c/b with any new orders or recommendations.  Husband states understanding.

## 2015-10-08 ENCOUNTER — Encounter (HOSPITAL_COMMUNITY): Payer: Self-pay | Admitting: *Deleted

## 2015-10-08 ENCOUNTER — Ambulatory Visit (HOSPITAL_COMMUNITY): Payer: Medicare Other

## 2015-10-08 ENCOUNTER — Emergency Department (HOSPITAL_COMMUNITY): Payer: Medicare Other

## 2015-10-08 ENCOUNTER — Inpatient Hospital Stay (HOSPITAL_COMMUNITY): Payer: Medicare Other

## 2015-10-08 DIAGNOSIS — J9601 Acute respiratory failure with hypoxia: Secondary | ICD-10-CM | POA: Diagnosis present

## 2015-10-08 DIAGNOSIS — Z79899 Other long term (current) drug therapy: Secondary | ICD-10-CM

## 2015-10-08 DIAGNOSIS — J849 Interstitial pulmonary disease, unspecified: Secondary | ICD-10-CM | POA: Diagnosis not present

## 2015-10-08 DIAGNOSIS — J439 Emphysema, unspecified: Secondary | ICD-10-CM | POA: Diagnosis not present

## 2015-10-08 DIAGNOSIS — N3281 Overactive bladder: Secondary | ICD-10-CM | POA: Diagnosis present

## 2015-10-08 DIAGNOSIS — R51 Headache: Secondary | ICD-10-CM | POA: Diagnosis not present

## 2015-10-08 DIAGNOSIS — F329 Major depressive disorder, single episode, unspecified: Secondary | ICD-10-CM | POA: Diagnosis present

## 2015-10-08 DIAGNOSIS — F17210 Nicotine dependence, cigarettes, uncomplicated: Secondary | ICD-10-CM | POA: Diagnosis present

## 2015-10-08 DIAGNOSIS — I82432 Acute embolism and thrombosis of left popliteal vein: Secondary | ICD-10-CM | POA: Diagnosis present

## 2015-10-08 DIAGNOSIS — I2699 Other pulmonary embolism without acute cor pulmonale: Secondary | ICD-10-CM

## 2015-10-08 DIAGNOSIS — Z8673 Personal history of transient ischemic attack (TIA), and cerebral infarction without residual deficits: Secondary | ICD-10-CM | POA: Diagnosis not present

## 2015-10-08 DIAGNOSIS — R0602 Shortness of breath: Secondary | ICD-10-CM | POA: Diagnosis not present

## 2015-10-08 DIAGNOSIS — J811 Chronic pulmonary edema: Secondary | ICD-10-CM | POA: Diagnosis present

## 2015-10-08 DIAGNOSIS — J42 Unspecified chronic bronchitis: Secondary | ICD-10-CM | POA: Diagnosis not present

## 2015-10-08 DIAGNOSIS — I82442 Acute embolism and thrombosis of left tibial vein: Secondary | ICD-10-CM | POA: Diagnosis present

## 2015-10-08 DIAGNOSIS — I110 Hypertensive heart disease with heart failure: Secondary | ICD-10-CM | POA: Diagnosis present

## 2015-10-08 DIAGNOSIS — I5043 Acute on chronic combined systolic (congestive) and diastolic (congestive) heart failure: Secondary | ICD-10-CM

## 2015-10-08 DIAGNOSIS — I82402 Acute embolism and thrombosis of unspecified deep veins of left lower extremity: Secondary | ICD-10-CM | POA: Diagnosis not present

## 2015-10-08 DIAGNOSIS — M8580 Other specified disorders of bone density and structure, unspecified site: Secondary | ICD-10-CM | POA: Diagnosis present

## 2015-10-08 DIAGNOSIS — I5033 Acute on chronic diastolic (congestive) heart failure: Secondary | ICD-10-CM | POA: Diagnosis present

## 2015-10-08 DIAGNOSIS — Z7982 Long term (current) use of aspirin: Secondary | ICD-10-CM | POA: Diagnosis not present

## 2015-10-08 DIAGNOSIS — R079 Chest pain, unspecified: Secondary | ICD-10-CM | POA: Diagnosis not present

## 2015-10-08 DIAGNOSIS — I480 Paroxysmal atrial fibrillation: Secondary | ICD-10-CM | POA: Diagnosis present

## 2015-10-08 DIAGNOSIS — J449 Chronic obstructive pulmonary disease, unspecified: Secondary | ICD-10-CM | POA: Diagnosis present

## 2015-10-08 DIAGNOSIS — I82409 Acute embolism and thrombosis of unspecified deep veins of unspecified lower extremity: Secondary | ICD-10-CM

## 2015-10-08 DIAGNOSIS — J810 Acute pulmonary edema: Secondary | ICD-10-CM | POA: Diagnosis not present

## 2015-10-08 DIAGNOSIS — T462X5A Adverse effect of other antidysrhythmic drugs, initial encounter: Secondary | ICD-10-CM | POA: Diagnosis present

## 2015-10-08 DIAGNOSIS — I4892 Unspecified atrial flutter: Secondary | ICD-10-CM | POA: Diagnosis not present

## 2015-10-08 DIAGNOSIS — I2690 Septic pulmonary embolism without acute cor pulmonale: Secondary | ICD-10-CM | POA: Diagnosis not present

## 2015-10-08 DIAGNOSIS — R938 Abnormal findings on diagnostic imaging of other specified body structures: Secondary | ICD-10-CM | POA: Diagnosis not present

## 2015-10-08 HISTORY — DX: Acute embolism and thrombosis of unspecified deep veins of unspecified lower extremity: I82.409

## 2015-10-08 HISTORY — DX: Other pulmonary embolism without acute cor pulmonale: I26.99

## 2015-10-08 LAB — TROPONIN I
Troponin I: <0.03 ng/mL (ref <0.03)
Troponin I: <0.03 ng/mL (ref <0.03)

## 2015-10-08 LAB — D-DIMER, QUANTITATIVE (NOT AT ARMC): D DIMER QUANT: 2.67 ug{FEU}/mL — AB (ref 0.00–0.50)

## 2015-10-08 LAB — BRAIN NATRIURETIC PEPTIDE: B NATRIURETIC PEPTIDE 5: 125.2 pg/mL — AB (ref 0.0–100.0)

## 2015-10-08 MED ORDER — METHOCARBAMOL 500 MG PO TABS: 500.0000 mg | ORAL_TABLET | Freq: Four times a day (QID) | ORAL | Status: DC | PRN

## 2015-10-08 MED ORDER — AMIODARONE HCL 200 MG PO TABS
200.0000 mg | ORAL_TABLET | Freq: Every day | ORAL | Status: DC
  Filled 2015-10-08 (×2): qty 1

## 2015-10-08 MED ORDER — FUROSEMIDE 10 MG/ML IJ SOLN
40.0000 mg | Freq: Every day | INTRAMUSCULAR | Status: DC
  Filled 2015-10-08: qty 4

## 2015-10-08 MED ORDER — ONDANSETRON HCL 4 MG/2ML IJ SOLN: 4.0000 mg | Freq: Four times a day (QID) | INTRAMUSCULAR | Status: DC | PRN

## 2015-10-08 MED ORDER — CLONAZEPAM 0.5 MG PO TABS
1.5000 mg | ORAL_TABLET | Freq: Every day | ORAL | Status: DC
  Administered 2015-10-08 – 2015-10-09 (×2): 2 mg via ORAL
  Administered 2015-10-10: 1.5 mg via ORAL
  Filled 2015-10-08: qty 4
  Filled 2015-10-08 (×2): qty 3
  Filled 2015-10-08: qty 4

## 2015-10-08 MED ORDER — OXYBUTYNIN CHLORIDE ER 10 MG PO TB24
10.0000 mg | ORAL_TABLET | Freq: Every day | ORAL | Status: DC
  Administered 2015-10-08 – 2015-10-12 (×5): 10 mg via ORAL
  Filled 2015-10-08 (×5): qty 1

## 2015-10-08 MED ORDER — ACETAMINOPHEN 325 MG PO TABS
650.0000 mg | ORAL_TABLET | ORAL | Status: DC | PRN
  Administered 2015-10-10 – 2015-10-11 (×2): 650 mg via ORAL
  Filled 2015-10-08 (×2): qty 2

## 2015-10-08 MED ORDER — DILTIAZEM HCL ER COATED BEADS 120 MG PO CP24
120.0000 mg | ORAL_CAPSULE | Freq: Every day | ORAL | Status: DC
  Administered 2015-10-08: 120 mg via ORAL
  Filled 2015-10-08: qty 1

## 2015-10-08 MED ORDER — ASPIRIN 81 MG PO CHEW
324.0000 mg | CHEWABLE_TABLET | Freq: Once | ORAL | Status: AC
  Administered 2015-10-08: 324 mg via ORAL
  Filled 2015-10-08: qty 4

## 2015-10-08 MED ORDER — SODIUM CHLORIDE 0.9 % IV SOLN: 250.0000 mL | INTRAVENOUS | Status: DC | PRN

## 2015-10-08 MED ORDER — FUROSEMIDE 10 MG/ML IJ SOLN
40.0000 mg | Freq: Two times a day (BID) | INTRAMUSCULAR | Status: DC
  Administered 2015-10-08 – 2015-10-10 (×4): 40 mg via INTRAVENOUS
  Filled 2015-10-08 (×4): qty 4

## 2015-10-08 MED ORDER — FUROSEMIDE 10 MG/ML IJ SOLN
20.0000 mg | Freq: Once | INTRAMUSCULAR | Status: AC
  Administered 2015-10-08: 20 mg via INTRAVENOUS
  Filled 2015-10-08: qty 2

## 2015-10-08 MED ORDER — IOPAMIDOL (ISOVUE-370) INJECTION 76%
INTRAVENOUS | Status: AC
  Administered 2015-10-08: 100 mL
  Filled 2015-10-08: qty 100

## 2015-10-08 MED ORDER — ACETAMINOPHEN 500 MG PO TABS: 500.0000 mg | ORAL_TABLET | Freq: Four times a day (QID) | ORAL | Status: DC | PRN

## 2015-10-08 MED ORDER — ENOXAPARIN SODIUM 40 MG/0.4ML ~~LOC~~ SOLN
40.0000 mg | SUBCUTANEOUS | Status: DC
  Administered 2015-10-08: 40 mg via SUBCUTANEOUS
  Filled 2015-10-08: qty 0.4

## 2015-10-08 MED ORDER — FUROSEMIDE 20 MG PO TABS
20.0000 mg | ORAL_TABLET | Freq: Every day | ORAL | Status: DC
Start: 2015-10-09 — End: 2015-10-09
  Administered 2015-10-09: 20 mg via ORAL
  Filled 2015-10-08 (×2): qty 1

## 2015-10-08 MED ORDER — SODIUM CHLORIDE 0.9% FLUSH
3.0000 mL | INTRAVENOUS | Status: DC | PRN
  Administered 2015-10-09: 3 mL via INTRAVENOUS
  Filled 2015-10-08: qty 3

## 2015-10-08 MED ORDER — SODIUM CHLORIDE 0.9% FLUSH
3.0000 mL | Freq: Two times a day (BID) | INTRAVENOUS | Status: DC
  Administered 2015-10-08 – 2015-10-11 (×5): 3 mL via INTRAVENOUS

## 2015-10-08 MED ORDER — VITAMIN D 1000 UNITS PO TABS
1000.0000 [IU] | ORAL_TABLET | Freq: Every day | ORAL | Status: DC
  Administered 2015-10-08 – 2015-10-12 (×5): 1000 [IU] via ORAL
  Filled 2015-10-08 (×5): qty 1

## 2015-10-08 MED ORDER — FLUOXETINE HCL 20 MG PO CAPS
20.0000 mg | ORAL_CAPSULE | Freq: Every day | ORAL | Status: DC
  Administered 2015-10-08 – 2015-10-12 (×5): 20 mg via ORAL
  Filled 2015-10-08 (×5): qty 1

## 2015-10-08 MED ORDER — ASPIRIN 81 MG PO CHEW
81.0000 mg | CHEWABLE_TABLET | Freq: Every day | ORAL | Status: DC
  Administered 2015-10-08 – 2015-10-12 (×5): 81 mg via ORAL
  Filled 2015-10-08 (×5): qty 1

## 2015-10-08 NOTE — Plan of Care (Signed)
Pt had a CTA chest today secondary to presenting sx and elevated DDimer. CTA was positive for "several small" PEs in RLL without heart strain. Pt also here with pulmonary edema/CHF decompensation.  Significant to note is the pt has a hx of a brain hemorrhage and has not been placed on anticoagulants since, even for her Afib with a CHADs score of 4. She does have a watchman device in her heart due to no anticoagulation. NP spoke with pt about findings and options for treatment given her hx of brain bleed. At this time, she wants to hold off on Heparin/anticoagulation for the PEs and will discuss further in am with attending. I suspect the risks outweigh the benefits of the anticoagulation, but will let others weigh in tomorrow morning.  Pt is not having CP, tachycardia, dyspnea, or hypoxia.   Discussed with Dr. Alcario Drought of [facility] who admitted pt yesterday.  Baltazar Najjar

## 2015-10-08 NOTE — ED Notes (Signed)
Oxygen applied via nasal cannula at 2 LPM.

## 2015-10-08 NOTE — Care Management Note (Signed)
Case Management Note  Patient Details  Name: SIMRANPREET FALZON MRN: [id number] Date of Birth: [birth year]  Subjective/Objective:         Admitted to Observation with Pulmonary Edema           Action/Plan: Patient lives at home with spouse; independent of her ADL's, continues to drive, no DME; private insurance with Medicare / BCBS with prescription drug coverage; pharmacy of choice is [facility]; patient reports no problem getting her medication. She had scales at home and weighs herself daily. She states that she "loves salt." CM encouraged pt to try to decrease her salt intake;   Expected Discharge Date:   poddibly 10/11/2015               Expected Discharge Plan:  Home/Self Care  In-House Referral:   [facility]F  Discharge planning Services  CM Consult  Status of Service:  In process, will continue to follow  Sherrilyn Rist [id number] 10/08/2015, 4:06 PM

## 2015-10-08 NOTE — Progress Notes (Signed)
Nutrition Brief Note  Patient identified on the Malnutrition Screening Tool (MST) Report  Wt Readings from Last 15 Encounters:  10/08/15 161 lb 11.2 oz (73.3 kg)  06/30/15 179 lb 3.2 oz (81.3 kg)  02/17/15 177 lb 6.4 oz (80.5 kg)  01/11/15 182 lb (82.6 kg)  12/31/14 175 lb (79.4 kg)  12/22/14 178 lb (80.7 kg)  12/16/14 178 lb 12.8 oz (81.1 kg)  11/27/14 178 lb 1.9 oz (80.8 kg)  08/25/14 187 lb 6.4 oz (85 kg)  08/11/14 181 lb 3.2 oz (82.2 kg)  08/07/14 181 lb 3.2 oz (82.2 kg)  06/11/14 186 lb 12.8 oz (84.7 kg)  04/23/14 184 lb 3.2 oz (83.6 kg)  03/09/14 184 lb (83.5 kg)  03/04/14 184 lb (83.5 kg)    Body mass index is 25.33 kg/m. Patient meets criteria for Overweight based on current BMI. Pt states that her appetite has been good and she has been eating normally. She states that the weight loss from 175 to 161 has been over the past year. She relates the weight loss to eating healthier- more vegetables and fruits and other fresh foods. She reports eating a lot of protein-rich foods throughout the day as well. She denies any questions regarding the heart healthy diet. States that she loves salt, but she can cut back.   Current diet order is Heart Healthy, patient is consuming approximately 75% of meals at this time. Labs and medications reviewed.   No nutrition interventions warranted at this time. If nutrition issues arise, please consult RD.   Scarlette Ar RD, LDN Inpatient Clinical Dietitian Pager: [phone] After Hours Pager: [phone]

## 2015-10-08 NOTE — ED Notes (Signed)
Patient transported to CT 

## 2015-10-08 NOTE — Care Management Obs Status (Signed)
[facility] NOTIFICATION   Patient Details  Name: ANALEAH MEINHARDT MRN: [id number] Date of Birth: 07/16/1940   Medicare Observation Status Notification Given:  Yes    Royston Bake, RN 10/08/2015, 4:05 PM

## 2015-10-08 NOTE — Progress Notes (Signed)
Patient admitted after midnight, please see H&P.  Worsening SOB over last few weeks.  D dimer + so will get CTA to r/o PE.  Appreciate cardiology eval.  BP low but not symptomatic-- have held lasix until seen by cards  Judy White

## 2015-10-08 NOTE — ED Notes (Signed)
Dr. Schlossman at bedside at this time.  

## 2015-10-08 NOTE — H&P (Signed)
History and Physical    Judy White [id number] DOB: 1940-05-27 DOA: 10/07/2015   PCP: Henrine Screws, MD Chief Complaint:  Chief Complaint  Patient presents with  . Chest Pain    HPI: Judy White is a 75 y.o. female with medical history significant of COPD, HTN, paroxysmal-A.Fib, patient not on blood thinners despite CHADS vasc of 4 due to history of brain bleed.  Instead patient had a new device (Watchman's) placed in November of last year in her left atrial appendage.  She has been on both rhythm control and rate control meds.  Amiodarone for several years to try and rhythm control her A.Fib/flutter; however, this hasnt really been working all that well (which is why they put in the [facility] device).  Recently she has had increased SOB, palpitations, over the past 1 week.  Chest tightness.  SOB worse with exertion, better at rest.  Productive cough and fatigue.  Cough worse when laying down  ED Course: She is in A.Fib/flutter at a rate of ~100-105 BPM.  She has pulmonary edema and what appears to be chronic interstitial lung disease as well on her CXR (first that this latter is mentioned).  She also has baseline COPD too.  Review of Systems: As per HPI otherwise 10 point review of systems negative.    Past Medical History:  Diagnosis Date  . Anxiety   . COPD (chronic obstructive pulmonary disease) ([place])   . Depressive disorder, not elsewhere classified   . HTN (hypertension)   . Hypercholesteremia   . Myalgia and myositis, unspecified   . Osteopenia   . Overactive bladder   . Paroxysmal atrial fibrillation ([place])    a. s/p Watchman implant 12/2014    Past Surgical History:  Procedure Laterality Date  . APPENDECTOMY    . BREAST SURGERY    . CARDIAC CATHETERIZATION     >5 yrs  . CESAREAN SECTION    . LEFT ATRIAL APPENDAGE OCCLUSION N/A 12/31/2014   Procedure: LEFT ATRIAL APPENDAGE OCCLUSION;  Surgeon: Thompson Grayer, MD;  Location: [place] [facility];  Service:  Cardiovascular;  Laterality: N/A;  . SHOULDER SURGERY    . TEE WITHOUT CARDIOVERSION N/A 12/22/2014   Procedure: TRANSESOPHAGEAL ECHOCARDIOGRAM (TEE);  Surgeon: Pixie Casino, MD;  Location: [facility] ENDOSCOPY;  Service: Cardiovascular;  Laterality: N/A;  . TEE WITHOUT CARDIOVERSION N/A 02/16/2015   post Watchman TEE with good seal and no leak around device   . TONSILLECTOMY    . WISDOM TOOTH EXTRACTION       reports that she has been smoking.  She has a 25.00 pack-year smoking history. She has never used smokeless tobacco. She reports that she drinks alcohol. She reports that she does not use drugs.  No Known Allergies  Family History  Problem Relation Age of Onset  . Aneurysm Father   . Hypotension Mother   . Neuropathy Brother   . Heart disease Brother   . Cancer      maternal side  . Heart Problems      paternal side      Prior to Admission medications   Medication Sig Start Date End Date Taking? Authorizing Provider  acetaminophen (TYLENOL) 500 MG tablet Take 500 mg by mouth every 6 (six) hours as needed (pain).    Historical Provider, MD  Ascorbic Acid (VITAMIN C PO) Take 500 mg by mouth daily.     Historical Provider, MD  aspirin 81 MG tablet Take 81 mg by mouth daily.  Historical Provider, MD  Calcium-Vitamin D (CALTRATE 600 PLUS-VIT D PO) Take 1 tablet by mouth daily.     Historical Provider, MD  cholecalciferol (VITAMIN D) 1000 UNITS tablet Take 1,000 Units by mouth daily.    Historical Provider, MD  clonazePAM (KLONOPIN) 1 MG tablet Take 1.5-2 tablets by mouth daily.  03/30/13   Historical Provider, MD  Cyanocobalamin (VITAMIN B-12 PO) Take 1 tablet by mouth daily.     Historical Provider, MD  diltiazem (CARDIZEM CD) 120 MG 24 hr capsule Take 1 capsule (120 mg total) by mouth daily. 01/04/15   Belva Crome, MD  FLUoxetine (PROZAC) 20 MG capsule Take 20 mg by mouth daily.  03/16/13   Historical Provider, MD  furosemide (LASIX) 20 MG tablet Take 1 tablet (20 mg total) by  mouth daily. 11/27/14   Belva Crome, MD  methocarbamol (ROBAXIN) 500 MG tablet Take 1 tablet (500 mg total) by mouth every 6 (six) hours as needed for muscle spasms. 08/13/14   Kristeen Miss, MD  Omega-3 Fatty Acids (FISH OIL PO) Take 1 tablet by mouth daily.     Historical Provider, MD  oxybutynin (DITROPAN-XL) 10 MG 24 hr tablet Take 1 tablet by mouth daily. 03/22/13   Historical Provider, MD  Potassium 75 MG TABS Take 1 tablet by mouth daily.    Historical Provider, MD    Physical Exam: Vitals:   10/08/15 0030 10/08/15 0045 10/08/15 0100 10/08/15 0130  BP: 127/78 128/67 125/73 125/75  Pulse: (!) 50 62 108 (!) 52  Resp: 19 18 19 13   Temp:      TempSrc:      SpO2: 93% 94% 96% 94%  Weight:      Height:          Constitutional: NAD, calm, comfortable Eyes: PERRL, lids and conjunctivae normal ENMT: Mucous membranes are moist. Posterior pharynx clear of any exudate or lesions.Normal dentition.  Neck: normal, supple, no masses, no thyromegaly Respiratory: clear to auscultation bilaterally, no wheezing, no crackles. Normal respiratory effort. No accessory muscle use.  Cardiovascular: Regular rate and rhythm, no murmurs / rubs / gallops. No extremity edema. 2+ pedal pulses. No carotid bruits.  Abdomen: no tenderness, no masses palpated. No hepatosplenomegaly. Bowel sounds positive.  Musculoskeletal: no clubbing / cyanosis. No joint deformity upper and lower extremities. Good ROM, no contractures. Normal muscle tone.  Skin: no rashes, lesions, ulcers. No induration Neurologic: CN 2-12 grossly intact. Sensation intact, DTR normal. Strength 5/5 in all 4.  Psychiatric: Normal judgment and insight. Alert and oriented x 3. Normal mood.    Labs on Admission: I have personally reviewed following labs and imaging studies  CBC:  Recent Labs Lab 10/07/15 1840  WBC 14.1*  NEUTROABS 12.0*  HGB 15.6*  HCT 46.1*  MCV 90.0  PLT [id number]   Basic Metabolic Panel:  Recent Labs Lab 10/07/15 1840    NA 133*  K 3.8  CL 100*  CO2 24  GLUCOSE 127*  BUN 13  CREATININE 0.89  CALCIUM 8.8*   GFR: Estimated Creatinine Clearance: 55.9 mL/min (by C-G formula based on SCr of 0.89 mg/dL). Liver Function Tests:  Recent Labs Lab 10/07/15 1840  AST 19  ALT 20  ALKPHOS 77  BILITOT 0.5  PROT 6.9  ALBUMIN 2.8*   No results for input(s): LIPASE, AMYLASE in the last 168 hours. No results for input(s): AMMONIA in the last 168 hours. Coagulation Profile: No results for input(s): INR, PROTIME in the last 168 hours. Cardiac Enzymes:  No results for input(s): CKTOTAL, CKMB, CKMBINDEX, TROPONINI in the last 168 hours. BNP (last 3 results) No results for input(s): PROBNP in the last 8760 hours. HbA1C: No results for input(s): HGBA1C in the last 72 hours. CBG: No results for input(s): GLUCAP in the last 168 hours. Lipid Profile: No results for input(s): CHOL, HDL, LDLCALC, TRIG, CHOLHDL, LDLDIRECT in the last 72 hours. Thyroid Function Tests: No results for input(s): TSH, T4TOTAL, FREET4, T3FREE, THYROIDAB in the last 72 hours. Anemia Panel: No results for input(s): VITAMINB12, FOLATE, FERRITIN, TIBC, IRON, RETICCTPCT in the last 72 hours. Urine analysis: No results found for: COLORURINE, APPEARANCEUR, LABSPEC, PHURINE, GLUCOSEU, HGBUR, BILIRUBINUR, KETONESUR, PROTEINUR, UROBILINOGEN, NITRITE, LEUKOCYTESUR Sepsis Labs: @LABRCNTIP (procalcitonin:4,lacticidven:4) )No results found for this or any previous visit (from the past 240 hour(s)).   Radiological Exams on Admission: Dg Chest 2 View  Result Date: 10/07/2015 CLINICAL DATA:  Central chest pain, shortness of breath, dizziness and nausea. EXAM: CHEST  2 VIEW COMPARISON:  01/01/2015 FINDINGS: Left arterial appendage occlusion device is stable. Cardiomediastinal silhouette is mildly enlarged. Mediastinal contours appear intact. There is no evidence of pneumothorax. There are increased interstitial markings which may be seen with  interstitial pulmonary edema. An element of chronic interstitial lung disease is also present with associated chronic hyperinflation of the lungs. There is mild blunting of the costophrenic angles which may be seen with small pleural effusions. There is right apical pleural thickening. Osseous structures are without acute abnormality. Soft tissues are grossly normal. IMPRESSION: Mildly enlarged heart. Interstitial pulmonary edema on the background of chronic interstitial lung disease. Chronic right apical pleural thickening, relatively stable when compared to the 2014 radiographs, which likely represents scarring. Electronically Signed   By: Fidela Salisbury M.D.   On: 10/07/2015 19:48    EKG: Independently reviewed.  Assessment/Plan Principal Problem:   Pulmonary edema Active Problems:   COPD (chronic obstructive pulmonary disease) (HCC)   On amiodarone therapy   Atrial flutter, paroxysmal (HCC)   Interstitial lung disease (HCC)   Acute on chronic diastolic (congestive) heart failure (HCC)    Pulmonary edema: Acute on chronic diastolic CHF -  HF pathway  Diuresing  PAF -  Given possible new interstitial lung disease and the fact that amiodarone appears not to be working in this patient, Dr. Raiford Simmonds recommends that we stop it so I have done so.  She should follow up with her cardiologist regarding this soon.  Continue Cardizem for rate control.  Per Dr. Raiford Simmonds: No need for anticoagulation thanks to this ;Watchman' device which got put in because they didn't want to anticoagulate her given history of brain bleed in past.  COPD - continue home nebs   DVT prophylaxis: Lovenox Code Status: Full Family Communication: No family in room Consults called: Spoke with Dr. Raiford Simmonds on phone Admission status: Admit to obs   Etta Quill DO [facility] Pager [phone] from 7PM-7AM  If 7AM-7PM, please contact the day physician for the patient [email or web address] Password  [email or web address]  10/08/2015, 2:15 AM

## 2015-10-08 NOTE — Consult Note (Signed)
Date: 10/08/2015               Patient Name:  Judy White MRN: [id number]  DOB: 10/12/1940 Age / Sex: 75 y.o., female   PCP: Josetta Huddle, MD         Requesting Physician: Dr. Geradine Girt, DO    Consulting Reason:  A- Fib     Chief Complaint: Chest pressure with SOB   History of Present Illness: Judy White is a 75 y.o. female with medical history significant of, HTN, paroxysmal-A.Fib, patient not on blood thinners despite CHADS vasc of 4 due to history of brain bleed.  Instead patient had a new device (Watchman's) placed in November of last year in her left atrial appendage,most probably due to her lack of response to Amiodarone which she was taking for many years. She states having worsening fatigue, chest tightness accompanied with SOB, palpitation over the last 2-3 wks. Yesterday around noon while at work she became dizzy and lightheaded with near passing out state, which prompt her to seek medical attension.She States that recently she becomes SOB while just walking down the hall and unable to lay flat due to SOB.Uses one big pillow, denies any PND.She states that recently her pulse stays in 80-90es which is unusual for her. At base line it was in 50-60. C/O mild dry cough, worse at night and dry throat but denies any chest pain, fever,night sweats, N/V/D.  C/O morning headaches for past 1-2 months, denies any recent change in vision. Headaches relieved with tylenol. Denies any recent change in wt., appetite, bowel and urinary habits. She had O2 sat. Of 86 at ED yesterday and started on 2L of O2. She is a smoker 1/2 pack/day for last 72yrs.  Meds: . amiodarone  200 mg Oral Daily  . aspirin  81 mg Oral Daily  . cholecalciferol  1,000 Units Oral Daily  . clonazePAM  1.5-2 mg Oral Daily  . enoxaparin (LOVENOX) injection  40 mg Subcutaneous Q24H  . FLUoxetine  20 mg Oral Daily  . furosemide  40 mg Intravenous BID  . [START ON 10/09/2015] furosemide  20 mg Oral Daily  . oxybutynin  10 mg  Oral Daily  . sodium chloride flush  3 mL Intravenous Q12H   Allergies: Allergies as of 10/07/2015  . (No Known Allergies)   Past Medical History:  Diagnosis Date  . Anxiety   . COPD (chronic obstructive pulmonary disease) ([place])   . Depressive disorder, not elsewhere classified   . HTN (hypertension)   . Hypercholesteremia   . Myalgia and myositis, unspecified   . Osteopenia   . Overactive bladder   . Paroxysmal atrial fibrillation ([place])    a. s/p Watchman implant 12/2014   Past Surgical History:  Procedure Laterality Date  . APPENDECTOMY    . BREAST SURGERY    . CARDIAC CATHETERIZATION     >5 yrs  . CESAREAN SECTION    . LEFT ATRIAL APPENDAGE OCCLUSION N/A 12/31/2014   Procedure: LEFT ATRIAL APPENDAGE OCCLUSION;  Surgeon: Thompson Grayer, MD;  Location: [facility];  Service: Cardiovascular;  Laterality: N/A;  . SHOULDER SURGERY    . TEE WITHOUT CARDIOVERSION N/A 12/22/2014   Procedure: TRANSESOPHAGEAL ECHOCARDIOGRAM (TEE);  Surgeon: Pixie Casino, MD;  Location: [facility] END[facility]COPY;  Service: Cardiovascular;  Laterality: N/A;  . TEE WITHOUT CARDIOVERSION N/A 02/16/2015   post Watchman TEE with good seal and no leak around device   . TONSILLECTOMY    .  WISDOM TOOTH EXTRACTION     Family History  Problem Relation Age of Onset  . Aneurysm Father   . Hypotension Mother   . Neuropathy Brother   . Heart disease Brother   . Cancer      maternal side  . Heart Problems      paternal side   Social History   Social History  . Marital status: Married    Spouse name: N/A  . Number of children: N/A  . Years of education: N/A   Occupational History  . Not on file.   Social History Main Topics  . Smoking status: Current Every Day Smoker    Packs/day: 0.50    Years: 50.00  . Smokeless tobacco: Never Used  . Alcohol use 0.0 oz/week     Comment: occ  . Drug use: No  . Sexual activity: Not on file   Other Topics Concern  . Not on file   Social History Narrative  .  No narrative on file    Review of Systems: Pertinent items are noted in HPI.  Physical Exam: Blood pressure (!) 88/60, pulse 81, temperature 97.8 F (36.6 C), temperature source Oral, resp. rate 16, height 5\' 7"  (1.702 m), weight 161 lb 11.2 oz (73.3 kg), SpO2 91 %. BP (!) 94/49 (BP Location: Left Arm)   Pulse 60   Temp 97.9 F (36.6 C) (Oral)   Resp 18   Ht 5\' 7"  (1.702 m)   Wt 161 lb 11.2 oz (73.3 kg)   SpO2 95%   BMI 25.33 kg/m   General Appearance:    Alert, cooperative, no distress, appears stated age  Head:    Normocephalic, without obvious abnormality, atraumatic  Eyes:    PERRL, conjunctiva/corneas clear, EOM's intact, fundi    benign, both eyes     Nose:   Nares normal, septum midline, mucosa normal, no drainage    or sinus tenderness  Throat:   Lips, mucosa, and tongue normal; teeth and gums normal  Neck:   Supple, symmetrical, trachea midline, no adenopathy;    thyroid:  no enlargement/tenderness/nodules; no carotid   bruit or JVD  Back:     Symmetric, no curvature, ROM normal, no CVA tenderness  Lungs:      Basal crackles bilaterally, respirations unlabored  Chest Wall:    No tenderness or deformity   Heart:    Irregular rate and rhythm, S1 and S2 normal, no murmur, rub   or gallop     Abdomen:     Soft, non-tender, bowel sounds active all four quadrants,    no masses, no organomegaly        Extremities:   Extremities normal, atraumatic,Venous engorgment, no cyanosis or edema, Decreased pedal pulses bilaterally.           Neurologic:   CNII-XII intact, normal strength, sensation and reflexes    throughout     Lab results: CBC Latest Ref Rng & Units 10/07/2015 02/17/2015 12/31/2014  WBC 4.0 - 10.5 K/uL 14.1(H) 7.0 8.1  Hemoglobin 12.0 - 15.0 g/dL 15.6(H) 14.4 14.5  Hematocrit 36.0 - 46.0 % 46.1(H) 44.3 44.0  Platelets 150 - 400 K/uL 294 217 206   BMP Latest Ref Rng & Units 10/07/2015 02/17/2015 01/01/2015  Glucose 65 - 99 mg/dL 127(H) 88 96  BUN 6 - 20  mg/dL 13 18 17   Creatinine 0.44 - 1.00 mg/dL 0.89 1.00(H) 1.06(H)  Sodium 135 - 145 mmol/L 133(L) 142 141  Potassium 3.5 - 5.1 mmol/L 3.8  3.7 4.0  Chloride 101 - 111 mmol/L 100(L) 104 102  CO2 22 - 32 mmol/L 24 28 31   Calcium 8.9 - 10.3 mg/dL 8.8(L) 8.7 8.4(L)   Trop. <0.03 BNP: 125.2 Imaging results: Dg Chest 2 View  Result Date: 10/07/2015 CLINICAL DATA:  Central chest pain, shortness of breath, dizziness and nausea. EXAM: CHEST  2 VIEW COMPARISON:  01/01/2015 FINDINGS: Left arterial appendage occlusion device is stable. Cardiomediastinal silhouette is mildly enlarged. Mediastinal contours appear intact. There is no evidence of pneumothorax. There are increased interstitial markings which may be seen with interstitial pulmonary edema. An element of chronic interstitial lung disease is also present with associated chronic hyperinflation of the lungs. There is mild blunting of the costophrenic angles which may be seen with small pleural effusions. There is right apical pleural thickening. Osseous structures are without acute abnormality. Soft tissues are grossly normal. IMPRESSION: Mildly enlarged heart. Interstitial pulmonary edema on the background of chronic interstitial lung disease. Chronic right apical pleural thickening, relatively stable when compared to the 2014 radiographs, which likely represents scarring. Electronically Signed   By: Fidela Salisbury M.D.   On: 10/07/2015 19:48   Ct Head Wo Contrast  Result Date: 10/08/2015 CLINICAL DATA:  Initial evaluation for acute headache. EXAM: CT HEAD WITHOUT CONTRAST TECHNIQUE: Contiguous axial images were obtained from the base of the skull through the vertex without intravenous contrast. COMPARISON:  Prior CT from 08/01/2010. FINDINGS: Scalp soft tissues within normal limits. No acute abnormality about the globes and orbits. Patient is status post lens extraction bilaterally. Paranasal sinuses are clear.  No mastoid effusion. Calvarium intact.  Mild age-related cerebral atrophy with chronic microvascular ischemic disease. No acute intracranial hemorrhage or evidence of large vessel territory infarct. No mass lesion, midline shift, or mass effect. No hydrocephalus. No extra-axial fluid collection. IMPRESSION: 1. No acute intracranial process. 2. Mild age-related cerebral atrophy with chronic small vessel ischemic disease. Electronically Signed   By: Jeannine Boga M.D.   On: 10/08/2015 02:22   Other results: EKG: Vent. rate 103 BPM PR interval * ms QRS duration 78 ms QT/QTc 352/461 ms P-R-T axes * 67 -49 Atrial fibrillation Septal infarct , age undetermined ST & T wave abnormality, consider inferior ischemia Abnormal ECG Atrial fibrillation versus flutter with variable block is new in comparison to prior  Assessment, Plan, & Recommendations by Problem: Judy White is a 75 y.o. female with medical history significant of, HTN, paroxysmal-A.Fib, presented to ED with  worsening fatigue, chest tightness accompanied with SOB, palpitation over the last 2-3 wks.  A-FIB: She was on rate and rhythm control medicines of Amiodorone 200 mg and Cardizem 100 mg for years, however, this hasnt really been working all that well (which is why they put in the Watchman's device).  Patient not on blood thinners despite CHADS vasc of 4 due to history of brain bleed. Her ECG done yesterday shows atrial fibrillation/flutter with variable block. Pt. Was discussed and seen with Dr.Bellina White and she advised -Restart Amiodarone 200 mg daily -Diurese her as HF can precipitate A- Fib. - Hold Cardizem during diuresing due to her low BP   Pulmonary Edema/Chronic Interstitial Lung Disease on CXR??Her Amiodorone was stopped yesterday considering her CXR report. She was saturating in mid ninties on 2l.  She should get a pulmonary function test and evaluation to access her lung disease. Chronic hypoxia can be responsible for her current complaints and morning  headaches. - Diurese her today and then do CT chest tomorrow to see  whether its fluid or any other lung pathology is there.  Diastolic Heart Failure: She doesn't look much volume overload with her recent wt. Being less then her previous measured wt. And no peripheral edema. But she had bilateral basal crackles. -Lasix 40 mg IV BID  - Hold Cardizem in the setting of low BP - Monitor BP while diuresing -ECHO   Dispo: Disposition is deferred at this time, awaiting improvement of current medical problems. Anticipated discharge in approximately 1-2 day(s).   The patient does have a current PCP Josetta Huddle, MD) and does need an [facility] hospital follow-up appointment after discharge.  The patient does not have transportation limitations that hinder transportation to clinic appointments.  Signed: Lorella Nimrod, MD 10/08/2015, 11:23 AM    The patient was seen, examined and discussed with Judy Nimrod, MD and I agree with the above.   A pleasant 75 year old female with known PAF, s/p Watchman device placement in 12/2014 sec inability to anticoagulate (prior intracranial bleed on coumadin in 2009), ongoing smoker, who presented with progressively worsening SOB x 2 weeks that was originally exertional but progressed to two pillow orthopnea and PND. The patient also noticed palpitations and was found to be in a-fib with ventr rates in 80', typically in 60'. She states that she was experiencing some chills, swetas and dry cough, her CBC shows leukocytosis, she also has elevated D dimer, her BP is low, amiodarone was discontinued because of concern of amiodarone toxicity.  Her physical exam shows irregular heart rhythm, and crackles at both lungs, O2 stas as low as 91%.CXR shows Interstitial pulmonary edema on the background of chronic interstitial lung disease.  At this point I would tread aggressively for acute on chronic combined systolic and diastolic CHF with lasix 40 mg iv BID. Her BP is low, I would  d/c cardizem for now and restart amiodarone 200 mg po daily for rate control. I would plan a chest CT for tomorrow since she already had a head CT today, chest CT for evaluation of interstitial lung disease and pulmonary embolism in the settings of elevated D dimer. If true interstitial lung disease, we will discuss with EP about antiarrhythmic regimen for the future.   Judy White 10/08/2015

## 2015-10-08 NOTE — ED Provider Notes (Signed)
[facility] DEPT Provider Note   CSN: [id number] Arrival date & time: 10/07/15  1826  By signing my name below, I, Judy White, attest that this documentation has been prepared under the direction and in the presence of Gareth Morgan, MD. Electronically Signed: Irene White, ED Scribe. 10/08/15. 12:49 AM.  First Provider Contact:  None    History   Chief Complaint Chief Complaint  Patient presents with  . Chest Pain   The history is provided by the patient. No language interpreter was used.  HPI Comments: Judy White is a 75 y.o. Female with a hx of COPD, HTN, paroxysmal A-Fib, and chronic diastolic heart failure who presents to the Emergency Department complaining of gradually worsening, intermittent, tight chest pain onset 1 week ago. Pt reports associated SOB, palpitations, headache, dizziness, and sudden onset lightheadedness that began 12 hours ago. Pt states that her chest tightness worsened at that time. Pt states that she almost passed out at work which caused her to come to the ED tonight. Pt reports worsening SOB with exertion. She has been complaining of productive cough and fatigue over the past few weeks. She states that the cough worsens when laying down. Pt is on baby aspirins and has taken 3 today. She states that she has a Watchman implant in the left shoulder for stroke prevention. Pt is on amiodarone. Her last stress test was performed in 2015. Pt denies leg pain and swelling.   Past Medical History:  Diagnosis Date  . Agoraphobia   . Anxiety   . Arthritis    "normal for the age; nothing gives me problems" (10/08/2015)  . COPD (chronic obstructive pulmonary disease) ([place])    "mild" (10/08/2015)  . Depressive disorder, not elsewhere classified   . Hypercholesteremia   . Hypertension   . Myalgia and myositis, unspecified   . Osteopenia   . Overactive bladder   . Paroxysmal atrial fibrillation (HCC)    a. s/p Watchman implant 12/2014  . Pneumonia X 1     Patient Active Problem List   Diagnosis Date Noted  . Pulmonary edema 10/08/2015  . Interstitial lung disease ([place]) 10/08/2015  . Acute on chronic diastolic (congestive) heart failure ([place]) 10/08/2015  . Paroxysmal atrial fibrillation (HCC)   . Typical atrial flutter ([place])   . Chronic diastolic heart failure ([place]) 08/25/2014  . Lumbar stenosis 08/11/2014  . Atrial flutter, paroxysmal ([employer]) 01/05/2014  . On amiodarone therapy 05/21/2013  . Hypercholesteremia   . Osteopenia   . HTN (hypertension)   . Depressive disorder, not elsewhere classified   . Anxiety   . Unspecified arthropathy, multiple sites   . Myalgia and myositis, unspecified   . COPD (chronic obstructive pulmonary disease) ([place])   . Overactive bladder     Past Surgical History:  Procedure Laterality Date  . APPENDECTOMY  1983  . BACK SURGERY    . CARDIAC CATHETERIZATION     >5 yrs  . CATARACT EXTRACTION W/ INTRAOCULAR LENS  IMPLANT, BILATERAL Bilateral   . CESAREAN SECTION  1967  . LEFT ATRIAL APPENDAGE OCCLUSION N/A 12/31/2014   Procedure: LEFT ATRIAL APPENDAGE OCCLUSION;  Surgeon: Thompson Grayer, MD;  Location: [facility];  Service: Cardiovascular;  Laterality: N/A;  . POSTERIOR FUSION LUMBAR SPINE Bilateral 07/2014  . SHOULDER ARTHROSCOPY W/ ROTATOR CUFF REPAIR Left   . SHOULDER SURGERY Right    "tendon broke; couldn't be repaired"  . TEE WITHOUT CARDIOVERSION N/A 12/22/2014   Procedure: TRANSESOPHAGEAL ECHOCARDIOGRAM (TEE);  Surgeon: Nadean Corwin  Hilty, MD;  Location: [facility];  Service: Cardiovascular;  Laterality: N/A;  . TEE WITHOUT CARDIOVERSION N/A 02/16/2015   post Watchman TEE with good seal and no leak around device   . TONSILLECTOMY    . TOTAL ABDOMINAL HYSTERECTOMY    . WISDOM TOOTH EXTRACTION      OB History    No data available       Home Medications    Prior to Admission medications   Medication Sig Start Date End Date Taking? Authorizing Provider  Ascorbic Acid (VITAMIN C  PO) Take 500 mg by mouth daily.    Yes Historical Provider, MD  aspirin 81 MG tablet Take 81 mg by mouth daily.   Yes Historical Provider, MD  Calcium-Vitamin D (CALTRATE 600 PLUS-VIT D PO) Take 1 tablet by mouth daily.    Yes Historical Provider, MD  cholecalciferol (VITAMIN D) 1000 UNITS tablet Take 1,000 Units by mouth daily.   Yes Historical Provider, MD  clonazePAM (KLONOPIN) 1 MG tablet Take 1.5-2 tablets by mouth daily.  03/30/13  Yes Historical Provider, MD  Cyanocobalamin (VITAMIN B-12 PO) Take 1 tablet by mouth daily.    Yes Historical Provider, MD  diltiazem (CARDIZEM CD) 120 MG 24 hr capsule Take 1 capsule (120 mg total) by mouth daily. 01/04/15  Yes Belva Crome, MD  FLUoxetine (PROZAC) 20 MG capsule Take 20 mg by mouth daily.  03/16/13  Yes Historical Provider, MD  furosemide (LASIX) 20 MG tablet Take 1 tablet (20 mg total) by mouth daily. 11/27/14  Yes Belva Crome, MD  Omega-3 Fatty Acids (FISH OIL PO) Take 1 tablet by mouth daily.    Yes Historical Provider, MD  oxybutynin (DITROPAN-XL) 10 MG 24 hr tablet Take 1 tablet by mouth daily. 03/22/13  Yes Historical Provider, MD  Potassium 75 MG TABS Take 1 tablet by mouth daily.   Yes Historical Provider, MD    Family History Family History  Problem Relation Age of Onset  . Aneurysm Father   . Hypotension Mother   . Neuropathy Brother   . Heart disease Brother   . Cancer      maternal side  . Heart Problems      paternal side    Social History Social History  Substance Use Topics  . Smoking status: Current Every Day Smoker    Packs/day: 0.50    Years: 60.00    Types: Cigarettes  . Smokeless tobacco: Never Used  . Alcohol use 0.0 oz/week     Comment: 10/08/2015 "don't drink at all right now; social drinker"     Allergies   Review of patient's allergies indicates no known allergies.   Review of Systems Review of Systems  Constitutional: Positive for fatigue. Negative for fever.  Respiratory: Positive for cough and  shortness of breath.   Cardiovascular: Positive for chest pain and palpitations. Negative for leg swelling.  Genitourinary: Negative for dysuria and frequency.  Neurological: Positive for dizziness and light-headedness.  All other systems reviewed and are negative.    Physical Exam Updated Vital Signs BP (!) 94/49 (BP Location: Left Arm)   Pulse 60   Temp 97.9 F (36.6 C) (Oral)   Resp 18   Ht 5\' 7"  (1.702 m)   Wt 161 lb 11.2 oz (73.3 kg)   SpO2 95%   BMI 25.33 kg/m   Physical Exam  Constitutional: She is oriented to person, place, and time. She appears well-developed and well-nourished.  HENT:  Head: Normocephalic and atraumatic.  Eyes: EOM are normal. Pupils are equal, round, and reactive to light.  Neck: Normal range of motion. Neck supple. No JVD present.  Cardiovascular: Normal rate and regular rhythm.  Exam reveals no gallop and no friction rub.   No murmur heard. Pulmonary/Chest: Effort normal. No respiratory distress.  Bibasilar crackles   Abdominal: Soft. There is no tenderness.  Musculoskeletal: Normal range of motion. She exhibits no edema.  Neurological: She is alert and oriented to person, place, and time.  Skin: Skin is warm and dry.  Psychiatric: She has a normal mood and affect. Her behavior is normal.  Nursing note and vitals reviewed.    ED Treatments / Results  DIAGNOSTIC STUDIES: Oxygen Saturation is 86% on RA, low by my interpretation.    COORDINATION OF CARE: 12:39 AM-Discussed treatment plan which includes labs, EKG, and x-ray with pt at bedside and pt agreed to plan.    Labs (all labs ordered are listed, but only abnormal results are displayed) Labs Reviewed  CBC WITH DIFFERENTIAL/PLATELET - Abnormal; Notable for the following:       Result Value   WBC 14.1 (*)    RBC 5.12 (*)    Hemoglobin 15.6 (*)    HCT 46.1 (*)    Neutro Abs 12.0 (*)    All other components within normal limits  COMPREHENSIVE METABOLIC PANEL - Abnormal; Notable  for the following:    Sodium 133 (*)    Chloride 100 (*)    Glucose, Bld 127 (*)    Calcium 8.8 (*)    Albumin 2.8 (*)    All other components within normal limits  BRAIN NATRIURETIC PEPTIDE - Abnormal; Notable for the following:    B Natriuretic Peptide 125.2 (*)    All other components within normal limits  D-DIMER, QUANTITATIVE (NOT AT [facility]) - Abnormal; Notable for the following:    D-Dimer, Quant 2.67 (*)    All other components within normal limits  TROPONIN I  TROPONIN I  TROPONIN I  I-STAT TROPOININ, ED    EKG  EKG Interpretation  Date/Time:  Thursday October 07 2015 18:28:52 EDT Ventricular Rate:  103 PR Interval:    QRS Duration: 78 QT Interval:  352 QTC Calculation: 461 R Axis:   67 Text Interpretation:  Atrial fibrillation Septal infarct , age undetermined ST & T wave abnormality, consider inferior ischemia Abnormal ECG Atrial fibrillation versus flutter with variable block is new in comparison to prior Confirmed by [facility] MD, [place] ([id number]) on 10/08/2015 12:05:12 AM Also confirmed by [facility] MD, [place] ([id number]), editor [place], Leda Gauze [phone])  on 10/08/2015 7:37:32 AM       Radiology Dg Chest 2 View  Result Date: 10/07/2015 CLINICAL DATA:  Central chest pain, shortness of breath, dizziness and nausea. EXAM: CHEST  2 VIEW COMPARISON:  01/01/2015 FINDINGS: Left arterial appendage occlusion device is stable. Cardiomediastinal silhouette is mildly enlarged. Mediastinal contours appear intact. There is no evidence of pneumothorax. There are increased interstitial markings which may be seen with interstitial pulmonary edema. An element of chronic interstitial lung disease is also present with associated chronic hyperinflation of the lungs. There is mild blunting of the costophrenic angles which may be seen with small pleural effusions. There is right apical pleural thickening. Osseous structures are without acute abnormality. Soft tissues are grossly normal. IMPRESSION:  Mildly enlarged heart. Interstitial pulmonary edema on the background of chronic interstitial lung disease. Chronic right apical pleural thickening, relatively stable when compared to the 2014 radiographs, which likely represents  scarring. Electronically Signed   By: Fidela Salisbury M.D.   On: 10/07/2015 19:48   Ct Head Wo Contrast  Result Date: 10/08/2015 CLINICAL DATA:  Initial evaluation for acute headache. EXAM: CT HEAD WITHOUT CONTRAST TECHNIQUE: Contiguous axial images were obtained from the base of the skull through the vertex without intravenous contrast. COMPARISON:  Prior CT from 08/01/2010. FINDINGS: Scalp soft tissues within normal limits. No acute abnormality about the globes and orbits. Patient is status post lens extraction bilaterally. Paranasal sinuses are clear.  No mastoid effusion. Calvarium intact. Mild age-related cerebral atrophy with chronic microvascular ischemic disease. No acute intracranial hemorrhage or evidence of large vessel territory infarct. No mass lesion, midline shift, or mass effect. No hydrocephalus. No extra-axial fluid collection. IMPRESSION: 1. No acute intracranial process. 2. Mild age-related cerebral atrophy with chronic small vessel ischemic disease. Electronically Signed   By: Jeannine Boga M.D.   On: 10/08/2015 02:22    Procedures Procedures (including critical care time)  Medications Ordered in ED Medications  clonazePAM (KLONOPIN) tablet 1.5-2 mg (2 mg Oral Given 10/08/15 1101)  oxybutynin (DITROPAN-XL) 24 hr tablet 10 mg (10 mg Oral Given 10/08/15 1101)  FLUoxetine (PROZAC) capsule 20 mg (20 mg Oral Given 10/08/15 1101)  aspirin chewable tablet 81 mg (81 mg Oral Given 10/08/15 1102)  cholecalciferol (VITAMIN D) tablet 1,000 Units (1,000 Units Oral Given 10/08/15 1101)  methocarbamol (ROBAXIN) tablet 500 mg (not administered)  sodium chloride flush (NS) 0.9 % injection 3 mL (3 mLs Intravenous Given 10/08/15 1103)  sodium chloride flush (NS)  0.9 % injection 3 mL (not administered)  0.9 %  sodium chloride infusion (not administered)  acetaminophen (TYLENOL) tablet 650 mg (not administered)  ondansetron (ZOFRAN) injection 4 mg (not administered)  enoxaparin (LOVENOX) injection 40 mg (40 mg Subcutaneous Given 10/08/15 1455)  furosemide (LASIX) tablet 20 mg (not administered)  furosemide (LASIX) injection 40 mg (not administered)  amiodarone (PACERONE) tablet 200 mg (200 mg Oral Not Given 10/08/15 1400)  furosemide (LASIX) injection 20 mg (20 mg Intravenous Given 10/08/15 0138)  aspirin chewable tablet 324 mg (324 mg Oral Given 10/08/15 0230)     Initial Impression / Assessment and Plan / ED Course  I have reviewed the triage vital signs and the nursing notes.  Pertinent labs & imaging results that were available during my care of the patient were reviewed by me and considered in my medical decision making (see chart for details).  Clinical Course   75yo female with history of mild COPD, htn, paroxysmal atrial fibrillation, chronic diastolic HF, presents with concern for chest tightness, shortness of breath. Patient in likely atrial flutter with variable block vs atrial fibrillation with rate control in the ED. XR shows pulmonary edema on background of interstitial lung disease. Pt with O2 to 89% which is new to her. Troponin negative. Pt high risk HEART score. Will admit for concern for hypoxia with possible contributing CHF, possible amiodarone induced interstitial lung disease, atrial flutter and exertional chest pain. Given lasix.   Final Clinical Impressions(s) / ED Diagnoses   Final diagnoses:  PE (pulmonary embolism) CT ordered to evaluate for, not diagnosed at this time.  Acute pulmonary edema (HCC)  Interstitial lung disease (HCC)  Paroxysmal atrial fibrillation (HCC)  Acute on chronic diastolic (congestive) heart failure (HCC)  Chest pain, unspecified chest pain type  I personally performed the services described in this  documentation, which was scribed in my presence. The recorded information has been reviewed and is accurate.  New Prescriptions Current Discharge Medication List       Gareth Morgan, MD 10/08/15 [phone]

## 2015-10-09 ENCOUNTER — Inpatient Hospital Stay (HOSPITAL_COMMUNITY): Payer: Medicare Other

## 2015-10-09 DIAGNOSIS — I2699 Other pulmonary embolism without acute cor pulmonale: Secondary | ICD-10-CM

## 2015-10-09 DIAGNOSIS — I5033 Acute on chronic diastolic (congestive) heart failure: Principal | ICD-10-CM

## 2015-10-09 DIAGNOSIS — I82402 Acute embolism and thrombosis of unspecified deep veins of left lower extremity: Secondary | ICD-10-CM

## 2015-10-09 DIAGNOSIS — I4892 Unspecified atrial flutter: Secondary | ICD-10-CM

## 2015-10-09 DIAGNOSIS — R0602 Shortness of breath: Secondary | ICD-10-CM

## 2015-10-09 DIAGNOSIS — J810 Acute pulmonary edema: Secondary | ICD-10-CM

## 2015-10-09 LAB — ECHOCARDIOGRAM COMPLETE
FS: 21 % — AB (ref 28–44)
Height: 67 in
IVS/LV PW RATIO, ED: 0.81
LA ID, A-P, ES: 32 mm
LA diam end sys: 32 mm
LA diam index: 1.72 cm/m2
LA vol A4C: 31.2 ml
LA vol index: 18.2 mL/m2
LA vol: 33.8 mL
LV PW d: 10.3 mm — AB (ref 0.6–1.1)
LVOT area: 2.84 cm2
LVOT diameter: 19 mm
Weight: 2627.2 oz

## 2015-10-09 LAB — BASIC METABOLIC PANEL
ANION GAP: 10 (ref 5–15)
BUN: 23 mg/dL — ABNORMAL HIGH (ref 6–20)
CHLORIDE: 98 mmol/L — AB (ref 101–111)
CO2: 26 mmol/L (ref 22–32)
CREATININE: 0.92 mg/dL (ref 0.44–1.00)
Calcium: 8.5 mg/dL — ABNORMAL LOW (ref 8.9–10.3)
GFR calc non Af Amer: 59 mL/min — ABNORMAL LOW (ref >60)
Glucose, Bld: 105 mg/dL — ABNORMAL HIGH (ref 65–99)
Potassium: 3.9 mmol/L (ref 3.5–5.1)
SODIUM: 134 mmol/L — AB (ref 135–145)

## 2015-10-09 MED ORDER — ENOXAPARIN SODIUM 120 MG/0.8ML ~~LOC~~ SOLN: 1.5000 mg/kg | SUBCUTANEOUS | Status: DC

## 2015-10-09 MED ORDER — ENOXAPARIN SODIUM 120 MG/0.8ML ~~LOC~~ SOLN
1.5000 mg/kg | SUBCUTANEOUS | Status: DC
  Administered 2015-10-09 – 2015-10-12 (×4): 115 mg via SUBCUTANEOUS
  Filled 2015-10-09 (×4): qty 0.8

## 2015-10-09 MED ORDER — SODIUM CHLORIDE 0.9% FLUSH
3.0000 mL | Freq: Two times a day (BID) | INTRAVENOUS | Status: DC
  Administered 2015-10-09 – 2015-10-12 (×6): 3 mL via INTRAVENOUS

## 2015-10-09 MED ORDER — BISOPROLOL FUMARATE 5 MG PO TABS
10.0000 mg | ORAL_TABLET | Freq: Every day | ORAL | Status: DC
  Administered 2015-10-09: 10 mg via ORAL
  Filled 2015-10-09 (×2): qty 2

## 2015-10-09 MED ORDER — SODIUM CHLORIDE 0.9% FLUSH: 3.0000 mL | INTRAVENOUS | Status: DC | PRN

## 2015-10-09 NOTE — Progress Notes (Signed)
PROGRESS NOTE    Judy White  [id number] DOB: May 13, 1940 DOA: 10/07/2015 PCP: Henrine Screws, MD   Outpatient Specialists:     Brief Narrative:  Judy White is a 75 y.o. female with medical history significant of COPD, HTN, paroxysmal-A.Fib, patient not on blood thinners despite CHADS vasc of 4 due to history of brain bleed.  Instead patient had a new device (Watchman's) placed in November of last year in her left atrial appendage.  She has been on both rhythm control and rate control meds.  Amiodarone for several years to try and rhythm control her A.Fib/flutter; however, this hasnt really been working all that well (which is why they put in the [facility] device).  Recently she has had increased SOB, palpitations, over the past 1 week.  Chest tightness.  SOB worse with exertion, better at rest.  Productive cough and fatigue.  Cough worse when laying down    Assessment & Plan:   Principal Problem:   Pulmonary edema Active Problems:   COPD (chronic obstructive pulmonary disease) (HCC)   On amiodarone therapy   Atrial flutter, paroxysmal (HCC)   Interstitial lung disease (HCC)   Acute on chronic diastolic (congestive) heart failure (HCC)   Pulmonary edema: Acute on chronic diastolic CHF -                       IV lasix per cards  PAF -                     per cards   -added bisporolol  COPD - continue home nebs -outpatient PFTs  PE/DVT -patient did not want coumadin -h/o prior bleed in 2007-- has been cleared by NS for anticoagulation -start pradaxa as has reversible agent -? Etiology- sedentary at work but no long trips, up to date on cancer screens-- mamogram, colonoscopy  DVT prophylaxis:  pradaxa  Code Status: Full Code   Family Communication: Friend at bedside  Disposition Plan:     Consultants:   cards  Procedures:        Subjective: Asking about medications  Objective: Vitals:   10/08/15 1933 10/08/15 2344 10/09/15 0425  10/09/15 1304  BP: (!) 97/48 (!) 104/52 91/61 110/68  Pulse: 67 62 (!) 108 (!) 105  Resp: 18 16 18 18   Temp: 97.7 F (36.5 C) 98.1 F (36.7 C) 98 F (36.7 C) 98 F (36.7 C)  TempSrc: Oral Oral Oral Oral  SpO2: 96% 95% 95% 90%  Weight:   74.5 kg (164 lb 3.2 oz)   Height:        Intake/Output Summary (Last 24 hours) at 10/09/15 1443 Last data filed at 10/09/15 0850  Gross per 24 hour  Intake              480 ml  Output              850 ml  Net             -370 ml   Filed Weights   10/07/15 1835 10/08/15 0343 10/09/15 0425  Weight: 74.8 kg (165 lb) 73.3 kg (161 lb 11.2 oz) 74.5 kg (164 lb 3.2 oz)    Examination:  General exam: Appears calm and comfortable  Respiratory system: diminished, no wheezing, few crackles at bases Cardiovascular system: irr and fast Gastrointestinal system: Abdomen is nondistended, soft and nontender. No organomegaly or masses felt. Normal bowel sounds heard. Skin: chronic skin changes on LE  Data Reviewed: I have personally reviewed following labs and imaging studies  CBC:  Recent Labs Lab 10/07/15 1840  WBC 14.1*  NEUTROABS 12.0*  HGB 15.6*  HCT 46.1*  MCV 90.0  PLT [id number]   Basic Metabolic Panel:  Recent Labs Lab 10/07/15 1840 10/09/15 0740  NA 133* 134*  K 3.8 3.9  CL 100* 98*  CO2 24 26  GLUCOSE 127* 105*  BUN 13 23*  CREATININE 0.89 0.92  CALCIUM 8.8* 8.5*   GFR: Estimated Creatinine Clearance: 54 mL/min (by C-G formula based on SCr of 0.92 mg/dL). Liver Function Tests:  Recent Labs Lab 10/07/15 1840  AST 19  ALT 20  ALKPHOS 77  BILITOT 0.5  PROT 6.9  ALBUMIN 2.8*   No results for input(s): LIPASE, AMYLASE in the last 168 hours. No results for input(s): AMMONIA in the last 168 hours. Coagulation Profile: No results for input(s): INR, PROTIME in the last 168 hours. Cardiac Enzymes:  Recent Labs Lab 10/08/15 0358 10/08/15 1017 10/08/15 1555  TROPONINI <0.03 <0.03 <0.03   BNP (last 3 results) No  results for input(s): PROBNP in the last 8760 hours. HbA1C: No results for input(s): HGBA1C in the last 72 hours. CBG: No results for input(s): GLUCAP in the last 168 hours. Lipid Profile: No results for input(s): CHOL, HDL, LDLCALC, TRIG, CHOLHDL, LDLDIRECT in the last 72 hours. Thyroid Function Tests: No results for input(s): TSH, T4TOTAL, FREET4, T3FREE, THYROIDAB in the last 72 hours. Anemia Panel: No results for input(s): VITAMINB12, FOLATE, FERRITIN, TIBC, IRON, RETICCTPCT in the last 72 hours. Urine analysis: No results found for: COLORURINE, APPEARANCEUR, LABSPEC, [place], GLUCOSEU, HGBUR, BILIRUBINUR, KETONESUR, PROTEINUR, UROBILINOGEN, NITRITE, LEUKOCYTESUR   )No results found for this or any previous visit (from the past 240 hour(s)).    Anti-infectives    None       Radiology Studies: Dg Chest 2 View  Result Date: 10/07/2015 CLINICAL DATA:  Central chest pain, shortness of breath, dizziness and nausea. EXAM: CHEST  2 VIEW COMPARISON:  01/01/2015 FINDINGS: Left arterial appendage occlusion device is stable. Cardiomediastinal silhouette is mildly enlarged. Mediastinal contours appear intact. There is no evidence of pneumothorax. There are increased interstitial markings which may be seen with interstitial pulmonary edema. An element of chronic interstitial lung disease is also present with associated chronic hyperinflation of the lungs. There is mild blunting of the costophrenic angles which may be seen with small pleural effusions. There is right apical pleural thickening. Osseous structures are without acute abnormality. Soft tissues are grossly normal. IMPRESSION: Mildly enlarged heart. Interstitial pulmonary edema on the background of chronic interstitial lung disease. Chronic right apical pleural thickening, relatively stable when compared to the 2014 radiographs, which likely represents scarring. Electronically Signed   By: Fidela Salisbury M.D.   On: 10/07/2015 19:48    Ct Head Wo Contrast  Result Date: 10/08/2015 CLINICAL DATA:  Initial evaluation for acute headache. EXAM: CT HEAD WITHOUT CONTRAST TECHNIQUE: Contiguous axial images were obtained from the base of the skull through the vertex without intravenous contrast. COMPARISON:  Prior CT from 08/01/2010. FINDINGS: Scalp soft tissues within normal limits. No acute abnormality about the globes and orbits. Patient is status post lens extraction bilaterally. Paranasal sinuses are clear.  No mastoid effusion. Calvarium intact. Mild age-related cerebral atrophy with chronic microvascular ischemic disease. No acute intracranial hemorrhage or evidence of large vessel territory infarct. No mass lesion, midline shift, or mass effect. No hydrocephalus. No extra-axial fluid collection. IMPRESSION: 1. No acute intracranial process. 2.  Mild age-related cerebral atrophy with chronic small vessel ischemic disease. Electronically Signed   By: Jeannine Boga M.D.   On: 10/08/2015 02:22   Ct Angio Chest Pe W Or Wo Contrast  Result Date: 10/08/2015 CLINICAL DATA:  Chest pain, shortness of breath, productive cough, fatigue, near syncope. EXAM: CT ANGIOGRAPHY CHEST WITH CONTRAST TECHNIQUE: Multidetector CT imaging of the chest was performed using the standard protocol during bolus administration of intravenous contrast. Multiplanar CT image reconstructions and MIPs were obtained to evaluate the vascular anatomy. CONTRAST:  62 cc Isovue 370 COMPARISON:  Chest radiographs obtained yesterday. FINDINGS: Mediastinum/Lymph Nodes: Several small right lower lobe pulmonary arterial filling defects. No enlarged lymph nodes. The right ventricular to left ventricular ratio is 0.78. Lungs/Pleura: Prominent pulmonary vasculature and interstitial markings throughout both lungs. Small amount of dependent atelectasis bilaterally. Diffuse peribronchial thickening. No lung nodules or pleural fluid. Upper abdomen: No acute findings. Musculoskeletal:  Thoracic spine degenerative changes. Review of the MIP images confirms the above findings. IMPRESSION: 1. Several small right lower lobe pulmonary emboli. 2. No evidence of right heart strain. 3. Pulmonary vascular congestion, chronic interstitial lung disease and possible superimposed interstitial pulmonary edema. 4. Chronic bronchitic changes. Critical Value/emergent results were called by telephone at the time of interpretation on 10/08/2015 at 10:32 pm to [facility], the patient's nurse , who verbally acknowledged these results. Electronically Signed   By: Claudie Revering M.D.   On: 10/08/2015 22:34        Scheduled Meds: . aspirin  81 mg Oral Daily  . bisoprolol  10 mg Oral Daily  . cholecalciferol  1,000 Units Oral Daily  . clonazePAM  1.5-2 mg Oral Daily  . FLUoxetine  20 mg Oral Daily  . furosemide  40 mg Intravenous BID  . oxybutynin  10 mg Oral Daily  . sodium chloride flush  3 mL Intravenous Q12H  . sodium chloride flush  3 mL Intravenous Q12H   Continuous Infusions:    LOS: 1 day    Time spent: 35 min    [place], DO [facility] Pager [phone]  If 7PM-7AM, please contact night-coverage [email or web address] Password [facility] 10/09/2015, 2:43 PM

## 2015-10-09 NOTE — Progress Notes (Signed)
ANTICOAGULATION CONSULT NOTE - Initial Consult  Pharmacy Consult for Lovenox  Indication: PE/DVT  No Known Allergies  Patient Measurements: Height: 5\' 7"  (170.2 cm) Weight: 164 lb 3.2 oz (74.5 kg) IBW/kg (Calculated) : 61.6   Vital Signs: Temp: 98 F (36.7 C) (08/12 1304) Temp Source: Oral (08/12 1304) BP: 110/68 (08/12 1304) Pulse Rate: 105 (08/12 1304)  Labs:  Recent Labs  10/07/15 1840 10/08/15 0358 10/08/15 1017 10/08/15 1555 10/09/15 0740  HGB 15.6*  --   --   --   --   HCT 46.1*  --   --   --   --   PLT 294  --   --   --   --   CREATININE 0.89  --   --   --  0.92  TROPONINI  --  <0.03 <0.03 <0.03  --     Estimated Creatinine Clearance: 54 mL/min (by C-G formula based on SCr of 0.92 mg/dL).   Medical History: Past Medical History:  Diagnosis Date  . Agoraphobia   . Anxiety   . Arthritis    "normal for the age; nothing gives me problems" (10/08/2015)  . COPD (chronic obstructive pulmonary disease) ([place])    "mild" (10/08/2015)  . Depressive disorder, not elsewhere classified   . Hypercholesteremia   . Hypertension   . Myalgia and myositis, unspecified   . Osteopenia   . Overactive bladder   . Paroxysmal atrial fibrillation (HCC)    a. s/p Watchman implant 12/2014  . Pneumonia X 1    Medications:  Prescriptions Prior to Admission  Medication Sig Dispense Refill Last Dose  . Ascorbic Acid (VITAMIN C PO) Take 500 mg by mouth daily.    10/07/2015 at Unknown time  . aspirin 81 MG tablet Take 81 mg by mouth daily.   10/07/2015 at Unknown time  . Calcium-Vitamin D (CALTRATE 600 PLUS-VIT D PO) Take 1 tablet by mouth daily.    10/07/2015 at Unknown time  . cholecalciferol (VITAMIN D) 1000 UNITS tablet Take 1,000 Units by mouth daily.   10/07/2015 at Unknown time  . clonazePAM (KLONOPIN) 1 MG tablet Take 1.5-2 tablets by mouth daily.    10/07/2015 at Unknown time  . Cyanocobalamin (VITAMIN B-12 PO) Take 1 tablet by mouth daily.    10/07/2015 at Unknown time  .  diltiazem (CARDIZEM CD) 120 MG 24 hr capsule Take 1 capsule (120 mg total) by mouth daily. 30 capsule 9 10/07/2015 at Unknown time  . FLUoxetine (PROZAC) 20 MG capsule Take 20 mg by mouth daily.    10/07/2015 at Unknown time  . furosemide (LASIX) 20 MG tablet Take 1 tablet (20 mg total) by mouth daily. 90 tablet 3 10/07/2015 at Unknown time  . Omega-3 Fatty Acids (FISH OIL PO) Take 1 tablet by mouth daily.    10/07/2015 at Unknown time  . oxybutynin (DITROPAN-XL) 10 MG 24 hr tablet Take 1 tablet by mouth daily.   10/07/2015 at Unknown time  . Potassium 75 MG TABS Take 1 tablet by mouth daily.   10/07/2015 at Unknown time    Assessment: 75 y.o female with h/o PAF not on anticoagulation PTA despite CHADS vasc of 4 due to h/o brain bleed (SDH) in 2007 while on warfarin. She presented 10/08/15 with worsening SOB over last few weeks.  D dimer + .  On 10/09/15 LE duplex + Left DVT and CTA is + for PE several small right lower lobe pulmonary emboli. Pharmacy consulted for Lovenox DVT/PE treatment. Dr. Eliseo Squires  plans to transition to oral Pradaxa after at least 5 days of Lovenox. SCr 0.92 , Estimated crcl ~ 50 ml/min  Discontinued the prophylactic lovenox 40mg  dose, last given yesterday @ 14:55.  PLTC wnl, hgb 15.6, HCt 46.1 No bleeding noted per RN's report.    Goal of Therapy:  Monitor platelets by anticoagulation protocol: Yes   Plan:  Give Lovenox 115 mg SQ q24h.  Monitor CBC q72h and SCr weekly . Monitor for s/sx of bleeding  Thank you for allowing pharmacy to be part of this patients care team. Nicole Cella, RPh Clinical Pharmacist Pager: [phone] 10/09/2015,2:37 PM

## 2015-10-09 NOTE — Progress Notes (Signed)
Subjective:  Just back from LE duplex which reportedly showed R DVT  Objective:   Vital Signs : Vitals:   10/08/15 1633 10/08/15 1933 10/08/15 2344 10/09/15 0425  BP: (!) 100/50 (!) 97/48 (!) 104/52 91/61  Pulse: (!) 50 67 62 (!) 108  Resp: '16 18 16 18  '$ Temp: 97.8 F (36.6 C) 97.7 F (36.5 C) 98.1 F (36.7 C) 98 F (36.7 C)  TempSrc: Oral Oral Oral Oral  SpO2: 96% 96% 95% 95%  Weight:    164 lb 3.2 oz (74.5 kg)  Height:        Intake/Output from previous day:  Intake/Output Summary (Last 24 hours) at 10/09/15 1255 Last data filed at 10/09/15 0850  Gross per 24 hour  Intake              780 ml  Output              850 ml  Net              -70 ml    I/O since admission: -355  Wt Readings from Last 3 Encounters:  10/09/15 164 lb 3.2 oz (74.5 kg)  06/30/15 179 lb 3.2 oz (81.3 kg)  02/17/15 177 lb 6.4 oz (80.5 kg)    Medications: . amiodarone  200 mg Oral Daily  . aspirin  81 mg Oral Daily  . cholecalciferol  1,000 Units Oral Daily  . clonazePAM  1.5-2 mg Oral Daily  . enoxaparin (LOVENOX) injection  40 mg Subcutaneous Q24H  . FLUoxetine  20 mg Oral Daily  . furosemide  40 mg Intravenous BID  . furosemide  20 mg Oral Daily  . oxybutynin  10 mg Oral Daily  . sodium chloride flush  3 mL Intravenous Q12H  . sodium chloride flush  3 mL Intravenous Q12H       Physical Exam:   General appearance: alert, cooperative and no distress Neck: no adenopathy, no JVD, supple, symmetrical, trachea midline and thyroid not enlarged, symmetric, no tenderness/mass/nodules Lungs: no wheezing Heart: regularly irregular rhythm Abdomen: soft, non-tender; bowel sounds normal; no masses,  no organomegaly Extremities: venous stasis dermatitis noted; no palpable chord Neurologic: Grossly normal   Rate: 124  Rhythm: atrial flutter  ECG (independently read by me): A Flutter at 103 with inferior T changes  Lab Results:   Recent Labs  10/07/15 1840 10/09/15 0740  NA 133*  134*  K 3.8 3.9  CL 100* 98*  CO2 24 26  GLUCOSE 127* 105*  BUN 13 23*  CREATININE 0.89 0.92  CALCIUM 8.8* 8.5*    Hepatic Function Latest Ref Rng & Units 10/07/2015 10/22/2013 04/03/2013  Total Protein 6.5 - 8.1 g/dL 6.9 7.0 7.0  Albumin 3.5 - 5.0 g/dL 2.8(L) 3.9 3.9  AST 15 - 41 U/L '19 18 14  '$ ALT 14 - 54 U/L '20 17 12  '$ Alk Phosphatase 38 - 126 U/L 77 67 69  Total Bilirubin 0.3 - 1.2 mg/dL 0.5 0.5 0.4  Bilirubin, Direct 0.0 - 0.3 mg/dL - 0.0 0.0     Recent Labs  10/07/15 1840  WBC 14.1*  NEUTROABS 12.0*  HGB 15.6*  HCT 46.1*  MCV 90.0  PLT 294     Recent Labs  10/08/15 0358 10/08/15 1017 10/08/15 1555  TROPONINI <0.03 <0.03 <0.03    Lab Results  Component Value Date   TSH 1.51 10/22/2013   No results for input(s): HGBA1C in the last 72 hours.   Recent Labs  10/07/15  1840  PROT 6.9  ALBUMIN 2.8*  AST 19  ALT 20  ALKPHOS 77  BILITOT 0.5   No results for input(s): INR in the last 72 hours. BNP (last 3 results)  Recent Labs  10/07/15 1840  BNP 125.2*    ProBNP (last 3 results) No results for input(s): PROBNP in the last 8760 hours.   Lipid Panel  No results found for: CHOL, TRIG, HDL, CHOLHDL, VLDL, LDLCALC, LDLDIRECT    Imaging:  Dg Chest 2 View  Result Date: 10/07/2015 CLINICAL DATA:  Central chest pain, shortness of breath, dizziness and nausea. EXAM: CHEST  2 VIEW COMPARISON:  01/01/2015 FINDINGS: Left arterial appendage occlusion device is stable. Cardiomediastinal silhouette is mildly enlarged. Mediastinal contours appear intact. There is no evidence of pneumothorax. There are increased interstitial markings which may be seen with interstitial pulmonary edema. An element of chronic interstitial lung disease is also present with associated chronic hyperinflation of the lungs. There is mild blunting of the costophrenic angles which may be seen with small pleural effusions. There is right apical pleural thickening. Osseous structures are  without acute abnormality. Soft tissues are grossly normal. IMPRESSION: Mildly enlarged heart. Interstitial pulmonary edema on the background of chronic interstitial lung disease. Chronic right apical pleural thickening, relatively stable when compared to the 2014 radiographs, which likely represents scarring. Electronically Signed   By: Fidela Salisbury M.D.   On: 10/07/2015 19:48   Ct Head Wo Contrast  Result Date: 10/08/2015 CLINICAL DATA:  Initial evaluation for acute headache. EXAM: CT HEAD WITHOUT CONTRAST TECHNIQUE: Contiguous axial images were obtained from the base of the skull through the vertex without intravenous contrast. COMPARISON:  Prior CT from 08/01/2010. FINDINGS: Scalp soft tissues within normal limits. No acute abnormality about the globes and orbits. Patient is status post lens extraction bilaterally. Paranasal sinuses are clear.  No mastoid effusion. Calvarium intact. Mild age-related cerebral atrophy with chronic microvascular ischemic disease. No acute intracranial hemorrhage or evidence of large vessel territory infarct. No mass lesion, midline shift, or mass effect. No hydrocephalus. No extra-axial fluid collection. IMPRESSION: 1. No acute intracranial process. 2. Mild age-related cerebral atrophy with chronic small vessel ischemic disease. Electronically Signed   By: Jeannine Boga M.D.   On: 10/08/2015 02:22   Ct Angio Chest Pe W Or Wo Contrast  Result Date: 10/08/2015 CLINICAL DATA:  Chest pain, shortness of breath, productive cough, fatigue, near syncope. EXAM: CT ANGIOGRAPHY CHEST WITH CONTRAST TECHNIQUE: Multidetector CT imaging of the chest was performed using the standard protocol during bolus administration of intravenous contrast. Multiplanar CT image reconstructions and MIPs were obtained to evaluate the vascular anatomy. CONTRAST:  62 cc Isovue 370 COMPARISON:  Chest radiographs obtained yesterday. FINDINGS: Mediastinum/Lymph Nodes: Several small right lower  lobe pulmonary arterial filling defects. No enlarged lymph nodes. The right ventricular to left ventricular ratio is 0.78. Lungs/Pleura: Prominent pulmonary vasculature and interstitial markings throughout both lungs. Small amount of dependent atelectasis bilaterally. Diffuse peribronchial thickening. No lung nodules or pleural fluid. Upper abdomen: No acute findings. Musculoskeletal: Thoracic spine degenerative changes. Review of the MIP images confirms the above findings. IMPRESSION: 1. Several small right lower lobe pulmonary emboli. 2. No evidence of right heart strain. 3. Pulmonary vascular congestion, chronic interstitial lung disease and possible superimposed interstitial pulmonary edema. 4. Chronic bronchitic changes. Critical Value/emergent results were called by telephone at the time of interpretation on 10/08/2015 at 10:32 pm to [facility], the patient's nurse , who verbally acknowledged these results. Electronically Signed  By: Claudie Revering M.D.   On: 10/08/2015 22:34      Assessment/Plan:   Principal Problem:   Pulmonary edema Active Problems:   COPD (chronic obstructive pulmonary disease) (HCC)   On amiodarone therapy   Atrial flutter, paroxysmal (HCC)   Interstitial lung disease (HCC)   Acute on chronic diastolic (congestive) heart failure ([place])   1. A Flutter;  Pt was restarted on amiodarone yesterday, but she refuses to take due to previously beeing told that it affected her lungs.  Will dc and add bisoprolol 10 mg daily with COPD for rate control.  2. H/O watchman device insertion with remote subdural hematoma 10 yrs ago.  3. New R DVT with scattered small PE on CT. Pt states that neurologist had told her thaty she can take anti-coagultion therpy.  Watchman device is not protective for her DVT mediated PEs. Would re-institute reversible anticoagulation if pt were to develop a recurrent bleed.  Consider warfarin or possibly pradaxa. No reversible agents yet on market for eliquis  or xarelto.  4. COPD and interstial lung disease.   Troy Sine, MD, [facility] 10/09/2015, 12:55 PM

## 2015-10-09 NOTE — Progress Notes (Signed)
*  Preliminary Results* Bilateral lower extremity venous duplex completed. The right lower extremity is negative for deep vein thrombosis. The left lower extremity is positive for acute deep vein thrombosis involving the left posterior tibial and peroneal veins. There is no evidence of Baker's cyst bilaterally.  Preliminary results discussed Dr. Eliseo Squires.  10/09/2015 12:08 PM Maudry Mayhew, BS, RVT, RDCS, RDMS

## 2015-10-09 NOTE — Progress Notes (Signed)
  Echocardiogram 2D Echocardiogram has been performed.  Johny Chess 10/09/2015, 2:13 PM

## 2015-10-09 NOTE — Progress Notes (Signed)
Patient stated she "felt funny".  I was called by CMT as her HR had consistently been in the 120's.  She had been active in room / ambulating to BR, etc.  Initial vital signs = 119/100, P=115. RR=18, sat= 100.  After 10 minutes, V/S re-checked.  B/P = 92/56 (resting), P=96, and sat = 96%.  Pt. Stated felt better, but concerned re:  Increased HR and voiced concerns about current medication of Amiodarone, which she states was removed from her profile before due to her pulmonary status.  Will review with MD.

## 2015-10-10 DIAGNOSIS — I2690 Septic pulmonary embolism without acute cor pulmonale: Secondary | ICD-10-CM

## 2015-10-10 DIAGNOSIS — I480 Paroxysmal atrial fibrillation: Secondary | ICD-10-CM

## 2015-10-10 LAB — CBC
HEMATOCRIT: 44 % (ref 36.0–46.0)
HEMOGLOBIN: 14.5 g/dL (ref 12.0–15.0)
MCH: 30.1 pg (ref 26.0–34.0)
MCHC: 33 g/dL (ref 30.0–36.0)
MCV: 91.3 fL (ref 78.0–100.0)
Platelets: 327 10*3/uL (ref 150–400)
RBC: 4.82 MIL/uL (ref 3.87–5.11)
RDW: 13.1 % (ref 11.5–15.5)
WBC: 15 10*3/uL — AB (ref 4.0–10.5)

## 2015-10-10 LAB — BASIC METABOLIC PANEL
ANION GAP: 12 (ref 5–15)
BUN: 27 mg/dL — AB (ref 6–20)
CHLORIDE: 94 mmol/L — AB (ref 101–111)
CO2: 29 mmol/L (ref 22–32)
Calcium: 8.8 mg/dL — ABNORMAL LOW (ref 8.9–10.3)
Creatinine, Ser: 0.98 mg/dL (ref 0.44–1.00)
GFR calc Af Amer: >60 mL/min (ref >60)
GFR calc non Af Amer: 54 mL/min — ABNORMAL LOW (ref >60)
GLUCOSE: 102 mg/dL — AB (ref 65–99)
POTASSIUM: 3.8 mmol/L (ref 3.5–5.1)
Sodium: 135 mmol/L (ref 135–145)

## 2015-10-10 LAB — TSH: TSH: 1.588 u[IU]/mL (ref 0.350–4.500)

## 2015-10-10 MED ORDER — FUROSEMIDE 20 MG PO TABS: 20.0000 mg | ORAL_TABLET | Freq: Every day | ORAL | Status: DC

## 2015-10-10 MED ORDER — BISOPROLOL FUMARATE 5 MG PO TABS
2.5000 mg | ORAL_TABLET | Freq: Every day | ORAL | Status: DC
  Administered 2015-10-11 – 2015-10-12 (×2): 2.5 mg via ORAL
  Filled 2015-10-10 (×2): qty 1

## 2015-10-10 MED ORDER — BISOPROLOL FUMARATE 5 MG PO TABS: 5.0000 mg | ORAL_TABLET | Freq: Every day | ORAL | Status: DC

## 2015-10-10 NOTE — Progress Notes (Signed)
SATURATION QUALIFICATIONS: (This note is used to comply with regulatory documentation for home oxygen)  Patient Saturations on Room Air at Rest = 91%  Patient Saturations on Room Air while Ambulating = 84%  Patient Saturations on 2 Liters of oxygen while Ambulating = 95 %

## 2015-10-10 NOTE — Progress Notes (Addendum)
       Patient Name: Judy White Date of Encounter: 10/10/2015    SUBJECTIVE: Denies dizziness and lightheadedness despite relatively low blood pressures. Concerned about her current medical regimen  TELEMETRY:  Sinus bradycardia with PACs. Vitals:   10/09/15 2300 10/10/15 0659 10/10/15 0823 10/10/15 1202  BP: (!) 92/55 (!) 98/48 (!) 92/45 (!) 99/50  Pulse: (!) 57 (!) 48 (!) 51 (!) 47  Resp: 16 18 18 18   Temp: 98.5 F (36.9 C) 98.3 F (36.8 C) 98 F (36.7 C) 98.1 F (36.7 C)  TempSrc: Oral Oral Oral Oral  SpO2: 95% 93% 96% 99%  Weight:  162 lb (73.5 kg)    Height:        Intake/Output Summary (Last 24 hours) at 10/10/15 1403 Last data filed at 10/10/15 0900  Gross per 24 hour  Intake              880 ml  Output              300 ml  Net              580 ml   LABS: Basic Metabolic Panel:  Recent Labs  10/09/15 0740 10/10/15 0414  NA 134* 135  K 3.9 3.8  CL 98* 94*  CO2 26 29  GLUCOSE 105* 102*  BUN 23* 27*  CREATININE 0.92 0.98  CALCIUM 8.5* 8.8*   CBC:  Recent Labs  10/07/15 1840 10/10/15 0414  WBC 14.1* 15.0*  NEUTROABS 12.0*  --   HGB 15.6* 14.5  HCT 46.1* 44.0  MCV 90.0 91.3  PLT 294 327   Cardiac Enzymes:  Recent Labs  10/08/15 0358 10/08/15 1017 10/08/15 1555  TROPONINI <0.03 <0.03 <0.03   Radiology/Studies:   Chest CT 10/08/2015  IMPRESSION: 1. Several small right lower lobe pulmonary emboli. 2. No evidence of right heart strain. 3. Pulmonary vascular congestion, chronic interstitial lung disease and possible superimposed interstitial pulmonary edema. 4. Chronic bronchitic changes.  Physical Exam: Blood pressure (!) 99/50, pulse (!) 47, temperature 98.1 F (36.7 C), temperature source Oral, resp. rate 18, height 5\' 7"  (1.702 m), weight 162 lb (73.5 kg), SpO2 99 %. Weight change: -2 lb 3.2 oz (-0.998 kg)  Wt Readings from Last 3 Encounters:  10/10/15 162 lb (73.5 kg)  06/30/15 179 lb 3.2 oz (81.3 kg)  02/17/15 177 lb 6.4 oz  (80.5 kg)   Decreased breath sounds at the bases. Soft systolic murmur on anterior auscultation No peripheral edema is noted.  ASSESSMENT:  1. Paroxysmal atrial fibrillation previously on amiodarone therapy for rhythm control in setting of prior intracranial hemorrhage related to anticoagulation therapy. 2. Watchman device placed because of inability to use anticoagulation therapy safely. 3. Admitted with cough and dyspnea and found to have Pulmonary Embolism  by CT. 4. Low blood pressure and bradycardia 5. Interstitial lung disease likely related to amiodarone therapy. Consider pulmonary consultation  Plan:  1. Sedimentation rate and TSH 2. Discontinue amiodarone, done earlier in this admission.  3. For now we will attempt rate control. If she is unable to tolerate this, will consider Tikosyn versus flecainide once am he watches out 4. May need to add digoxin for rate control. 5. May need to decrease beta blocker and/or calcium channel blocker dose intensity. 6. Discontinue diuretic therapy for the time being. 7. Consider pulmonary consultation to determine if steroid therapy would be helpful.  Signed, Belva Crome III 10/10/2015, 2:03 PM

## 2015-10-10 NOTE — Progress Notes (Signed)
PROGRESS NOTE    Judy White  [id number] DOB: 09/03/1940 DOA: 10/07/2015 PCP: Henrine Screws, MD   Outpatient Specialists:     Brief Narrative:  Judy White is a 75 y.o. female with medical history significant of COPD, HTN, paroxysmal-A.Fib, patient not on blood thinners despite CHADS vasc of 4 due to history of brain bleed.  Instead patient had a new device (Watchman's) placed in November of last year in her left atrial appendage.  She has been on both rhythm control and rate control meds.  Amiodarone for several years to try and rhythm control her A.Fib/flutter; however, this hasnt really been working all that well (which is why they put in the [facility] device).  Recently she has had increased SOB, palpitations, over the past 1 week.  Chest tightness.  SOB worse with exertion, better at rest.  Productive cough and fatigue.  Cough worse when laying down    Assessment & Plan:   Principal Problem:   Pulmonary edema Active Problems:   COPD (chronic obstructive pulmonary disease) (HCC)   On amiodarone therapy   Atrial flutter, paroxysmal (HCC)   Interstitial lung disease (HCC)   Acute on chronic diastolic (congestive) heart failure (HCC)   Pulmonary embolism without acute cor pulmonale (HCC)   Pulmonary edema: Acute on chronic diastolic CHF -                       -lung clear today   -change back to PO in AM  PAF -                     per cards   -added bisporolol- now in sinus and rate <50- will lower dose place holding parameters  COPD - continue home nebs -outpatient PFTs -home O2 study  PE/DVT -patient did not want coumadin -h/o prior bleed in 2007-- has been cleared by NS for anticoagulation -start pradaxa as has reversible agent after 5 days of lovenox -? Etiology- sedentary at work but no long trips, up to date on cancer screens-- mamogram, colonoscopy  Patient appears to have memory issues.  Would repeat what she told me several times.  She  continually asked the plan despite going over it with her several times.  She also complained about her bed, the times her medications were given but did not want me to involve the charge nurse.  ? Alcohol abuse but no signs of withdrawal- no tremors vs early dementia..   DVT prophylaxis:  pradaxa after 5 days of lovenox  Code Status: Full Code   Family Communication: Friend at bedside  Disposition Plan:     Consultants:   cards  Procedures:        Subjective: Asking about medications  Objective: Vitals:   10/09/15 1304 10/09/15 2300 10/10/15 0659 10/10/15 0823  BP: 110/68 (!) 92/55 (!) 98/48 (!) 92/45  Pulse: (!) 105 (!) 57 (!) 48 (!) 51  Resp: 18 16 18 18   Temp: 98 F (36.7 C) 98.5 F (36.9 C) 98.3 F (36.8 C) 98 F (36.7 C)  TempSrc: Oral Oral Oral Oral  SpO2: 90% 95% 93% 96%  Weight:   73.5 kg (162 lb)   Height:        Intake/Output Summary (Last 24 hours) at 10/10/15 1151 Last data filed at 10/10/15 0900  Gross per 24 hour  Intake              880 ml  Output  300 ml  Net              580 ml   Filed Weights   10/08/15 0343 10/09/15 0425 10/10/15 0659  Weight: 73.3 kg (161 lb 11.2 oz) 74.5 kg (164 lb 3.2 oz) 73.5 kg (162 lb)    Examination:  General exam: Appears calm and comfortable  Respiratory system: diminished, no wheezing Cardiovascular system: sinus Gastrointestinal system: Abdomen is nondistended, soft and nontender. No organomegaly or masses felt. Normal bowel sounds heard. Skin: chronic skin changes on LE     Data Reviewed: I have personally reviewed following labs and imaging studies  CBC:  Recent Labs Lab 10/07/15 1840 10/10/15 0414  WBC 14.1* 15.0*  NEUTROABS 12.0*  --   HGB 15.6* 14.5  HCT 46.1* 44.0  MCV 90.0 91.3  PLT 294 [id number]   Basic Metabolic Panel:  Recent Labs Lab 10/07/15 1840 10/09/15 0740 10/10/15 0414  NA 133* 134* 135  K 3.8 3.9 3.8  CL 100* 98* 94*  CO2 24 26 29   GLUCOSE 127* 105*  102*  BUN 13 23* 27*  CREATININE 0.89 0.92 0.98  CALCIUM 8.8* 8.5* 8.8*   GFR: Estimated Creatinine Clearance: 46.8 mL/min (by C-G formula based on SCr of 0.98 mg/dL). Liver Function Tests:  Recent Labs Lab 10/07/15 1840  AST 19  ALT 20  ALKPHOS 77  BILITOT 0.5  PROT 6.9  ALBUMIN 2.8*   No results for input(s): LIPASE, AMYLASE in the last 168 hours. No results for input(s): AMMONIA in the last 168 hours. Coagulation Profile: No results for input(s): INR, PROTIME in the last 168 hours. Cardiac Enzymes:  Recent Labs Lab 10/08/15 0358 10/08/15 1017 10/08/15 1555  TROPONINI <0.03 <0.03 <0.03   BNP (last 3 results) No results for input(s): PROBNP in the last 8760 hours. HbA1C: No results for input(s): HGBA1C in the last 72 hours. CBG: No results for input(s): GLUCAP in the last 168 hours. Lipid Profile: No results for input(s): CHOL, HDL, LDLCALC, TRIG, CHOLHDL, LDLDIRECT in the last 72 hours. Thyroid Function Tests: No results for input(s): TSH, T4TOTAL, FREET4, T3FREE, THYROIDAB in the last 72 hours. Anemia Panel: No results for input(s): VITAMINB12, FOLATE, FERRITIN, TIBC, IRON, RETICCTPCT in the last 72 hours. Urine analysis: No results found for: COLORURINE, APPEARANCEUR, LABSPEC, [place], GLUCOSEU, HGBUR, BILIRUBINUR, KETONESUR, PROTEINUR, UROBILINOGEN, NITRITE, LEUKOCYTESUR   )No results found for this or any previous visit (from the past 240 hour(s)).    Anti-infectives    None       Radiology Studies: Ct Angio Chest Pe W Or Wo Contrast  Result Date: 10/08/2015 CLINICAL DATA:  Chest pain, shortness of breath, productive cough, fatigue, near syncope. EXAM: CT ANGIOGRAPHY CHEST WITH CONTRAST TECHNIQUE: Multidetector CT imaging of the chest was performed using the standard protocol during bolus administration of intravenous contrast. Multiplanar CT image reconstructions and MIPs were obtained to evaluate the vascular anatomy. CONTRAST:  62 cc Isovue 370  COMPARISON:  Chest radiographs obtained yesterday. FINDINGS: Mediastinum/Lymph Nodes: Several small right lower lobe pulmonary arterial filling defects. No enlarged lymph nodes. The right ventricular to left ventricular ratio is 0.78. Lungs/Pleura: Prominent pulmonary vasculature and interstitial markings throughout both lungs. Small amount of dependent atelectasis bilaterally. Diffuse peribronchial thickening. No lung nodules or pleural fluid. Upper abdomen: No acute findings. Musculoskeletal: Thoracic spine degenerative changes. Review of the MIP images confirms the above findings. IMPRESSION: 1. Several small right lower lobe pulmonary emboli. 2. No evidence of right heart strain. 3. Pulmonary vascular congestion,  chronic interstitial lung disease and possible superimposed interstitial pulmonary edema. 4. Chronic bronchitic changes. Critical Value/emergent results were called by telephone at the time of interpretation on 10/08/2015 at 10:32 pm to [facility], the patient's nurse , who verbally acknowledged these results. Electronically Signed   By: Claudie Revering M.D.   On: 10/08/2015 22:34        Scheduled Meds: . aspirin  81 mg Oral Daily  . bisoprolol  10 mg Oral Daily  . cholecalciferol  1,000 Units Oral Daily  . clonazePAM  1.5-2 mg Oral Daily  . enoxaparin (LOVENOX) injection  1.5 mg/kg Subcutaneous Q24H  . FLUoxetine  20 mg Oral Daily  . [START ON 10/11/2015] furosemide  20 mg Oral Daily  . oxybutynin  10 mg Oral Daily  . sodium chloride flush  3 mL Intravenous Q12H  . sodium chloride flush  3 mL Intravenous Q12H   Continuous Infusions:    LOS: 2 days    Time spent: 25 min    [place], DO [facility] Pager [phone]  If 7PM-7AM, please contact night-coverage [email or web address] Password [facility] 10/10/2015, 11:51 AM

## 2015-10-11 DIAGNOSIS — R938 Abnormal findings on diagnostic imaging of other specified body structures: Secondary | ICD-10-CM

## 2015-10-11 DIAGNOSIS — R9389 Abnormal findings on diagnostic imaging of other specified body structures: Secondary | ICD-10-CM

## 2015-10-11 DIAGNOSIS — I2699 Other pulmonary embolism without acute cor pulmonale: Secondary | ICD-10-CM

## 2015-10-11 LAB — CBC
HCT: 41.7 % (ref 36.0–46.0)
Hemoglobin: 13.9 g/dL (ref 12.0–15.0)
MCH: 30.5 pg (ref 26.0–34.0)
MCHC: 33.3 g/dL (ref 30.0–36.0)
MCV: 91.4 fL (ref 78.0–100.0)
PLATELETS: 297 10*3/uL (ref 150–400)
RBC: 4.56 MIL/uL (ref 3.87–5.11)
RDW: 12.9 % (ref 11.5–15.5)
WBC: 13.7 10*3/uL — ABNORMAL HIGH (ref 4.0–10.5)

## 2015-10-11 LAB — BASIC METABOLIC PANEL
Anion gap: 5 (ref 5–15)
BUN: 26 mg/dL — AB (ref 6–20)
CALCIUM: 8.5 mg/dL — AB (ref 8.9–10.3)
CHLORIDE: 97 mmol/L — AB (ref 101–111)
CO2: 33 mmol/L — AB (ref 22–32)
CREATININE: 0.96 mg/dL (ref 0.44–1.00)
GFR calc Af Amer: >60 mL/min (ref >60)
GFR calc non Af Amer: 56 mL/min — ABNORMAL LOW (ref >60)
Glucose, Bld: 107 mg/dL — ABNORMAL HIGH (ref 65–99)
Potassium: 3.7 mmol/L (ref 3.5–5.1)
Sodium: 135 mmol/L (ref 135–145)

## 2015-10-11 LAB — CREATININE, SERUM
CREATININE: 1.03 mg/dL — AB (ref 0.44–1.00)
GFR calc Af Amer: 59 mL/min — ABNORMAL LOW (ref >60)
GFR, EST NON AFRICAN AMERICAN: 51 mL/min — AB (ref >60)

## 2015-10-11 LAB — SEDIMENTATION RATE: Sed Rate: 30 mm/hr — ABNORMAL HIGH (ref 0–22)

## 2015-10-11 MED ORDER — PANTOPRAZOLE SODIUM 40 MG PO TBEC
40.0000 mg | DELAYED_RELEASE_TABLET | Freq: Every day | ORAL | Status: DC
  Administered 2015-10-11 – 2015-10-12 (×2): 40 mg via ORAL
  Filled 2015-10-11 (×2): qty 1

## 2015-10-11 MED ORDER — CLONAZEPAM 0.5 MG PO TABS
1.5000 mg | ORAL_TABLET | Freq: Every day | ORAL | Status: DC
  Administered 2015-10-11 – 2015-10-12 (×2): 1.5 mg via ORAL
  Filled 2015-10-11: qty 3

## 2015-10-11 MED ORDER — ALUM & MAG HYDROXIDE-SIMETH 200-200-20 MG/5ML PO SUSP: 15.0000 mL | ORAL | Status: DC | PRN

## 2015-10-11 NOTE — Consult Note (Signed)
Name: Judy White MRN: [id number] DOB: 1940-10-12    ADMISSION DATE:  10/07/2015 CONSULTATION DATE:  10/11/15  REFERRING MD :  Dr Harrington Challenger, Cardiology  REASON FOR CONSULTATION:  Pulmonary emboli, Abnormal CT scan Chest  STUDIES:  CT-PA 8/11 >> several RLL pulmonary emboli, evidence for pulmonary vascular congestion with some basilar more confluent GG change LE doppler US 8/12 >> RLE DVT   HISTORY OF PRESENT ILLNESS:  75 yo woman with hx A fib and watchman placement, on amiodarone but not anti-coag since 01/2015 due to concern regarding hx of SDH in 2007. Also w hx HTN, tobacco use and COPD. She was admitted with light-headedness, near-syncope 8/11, found to be hypoxemic. CT chest 8/11 showed scattered RLL PE's, some evidence for interstitial edema and GGI. LE doppler study showed RLE DVT. She has been started on therapeutic enoxaparin. PCCM consulted regarding her PE / anticoagulation, as well as her abnormal CT chest.   PAST MEDICAL HISTORY :   has a past medical history of Agoraphobia; Anxiety; Arthritis; COPD (chronic obstructive pulmonary disease) ([place]); Depressive disorder, not elsewhere classified; Hypercholesteremia; Hypertension; Myalgia and myositis, unspecified; Osteopenia; Overactive bladder; Paroxysmal atrial fibrillation ([place]); and Pneumonia (X 1).  has a past surgical history that includes Tonsillectomy; Cesarean section [id number]); Wisdom tooth extraction; TEE without cardioversion (N/A, 12/22/2014); TEE without cardioversion (N/A, 02/16/2015); Left Atrial Appendage Occlusion (N/A, 12/31/2014); Appendectomy (1983); Back surgery; Posterior fusion lumbar spine (Bilateral, 07/2014); Shoulder arthroscopy w/ rotator cuff repair (Left); Shoulder surgery (Right); Total abdominal hysterectomy; Cataract extraction w/ intraocular lens  implant, bilateral (Bilateral); and Cardiac catheterization. Prior to Admission medications   Medication Sig Start Date End Date Taking? Authorizing Provider    Ascorbic Acid (VITAMIN C PO) Take 500 mg by mouth daily.    Yes Historical Provider, MD  aspirin 81 MG tablet Take 81 mg by mouth daily.   Yes Historical Provider, MD  Calcium-Vitamin D (CALTRATE 600 PLUS-VIT D PO) Take 1 tablet by mouth daily.    Yes Historical Provider, MD  cholecalciferol (VITAMIN D) 1000 UNITS tablet Take 1,000 Units by mouth daily.   Yes Historical Provider, MD  clonazePAM (KLONOPIN) 1 MG tablet Take 1.5-2 tablets by mouth daily.  03/30/13  Yes Historical Provider, MD  Cyanocobalamin (VITAMIN B-12 PO) Take 1 tablet by mouth daily.    Yes Historical Provider, MD  diltiazem (CARDIZEM CD) 120 MG 24 hr capsule Take 1 capsule (120 mg total) by mouth daily. 01/04/15  Yes Belva Crome, MD  FLUoxetine (PROZAC) 20 MG capsule Take 20 mg by mouth daily.  03/16/13  Yes Historical Provider, MD  furosemide (LASIX) 20 MG tablet Take 1 tablet (20 mg total) by mouth daily. 11/27/14  Yes Belva Crome, MD  Omega-3 Fatty Acids (FISH OIL PO) Take 1 tablet by mouth daily.    Yes Historical Provider, MD  oxybutynin (DITROPAN-XL) 10 MG 24 hr tablet Take 1 tablet by mouth daily. 03/22/13  Yes Historical Provider, MD  Potassium 75 MG TABS Take 1 tablet by mouth daily.   Yes Historical Provider, MD   No Known Allergies  FAMILY HISTORY:  family history includes Aneurysm in her father; Heart disease in her brother; Hypotension in her mother; Neuropathy in her brother. SOCIAL HISTORY:  reports that she has been smoking Cigarettes.  She has a 30.00 pack-year smoking history. She has never used smokeless tobacco. She reports that she drinks alcohol. She reports that she does not use drugs.  REVIEW OF SYSTEMS:   Constitutional: Negative  for fever, chills, weight loss, malaise/fatigue and diaphoresis.  HENT: Negative for hearing loss, ear pain, nosebleeds, congestion, sore throat, neck pain, tinnitus and ear discharge.   Eyes: Negative for blurred vision, double vision, photophobia, pain, discharge and  redness.  Respiratory: Negative for cough, hemoptysis, sputum production, shortness of breath, wheezing and stridor.   Cardiovascular: Negative for chest pain, palpitations, orthopnea, claudication, leg swelling and PND.  Gastrointestinal: Negative for heartburn, nausea, vomiting, abdominal pain, diarrhea, constipation, blood in stool and melena.  Genitourinary: Negative for dysuria, urgency, frequency, hematuria and flank pain.  Musculoskeletal: Negative for myalgias, back pain, joint pain and falls.  Skin: Negative for itching and rash.  Neurological: Negative for dizziness, tingling, tremors, sensory change, speech change, focal weakness, seizures, loss of consciousness, weakness and headaches.  Endo/Heme/Allergies: Negative for environmental allergies and polydipsia. Does not bruise/bleed easily.  SUBJECTIVE:   VITAL SIGNS: Temp:  [97.6 F (36.4 C)-98.3 F (36.8 C)] 97.7 F (36.5 C) (08/14 1214) Pulse Rate:  [47-53] 50 (08/14 1214) Resp:  [18] 18 (08/14 1214) BP: (99-111)/(40-55) 111/55 (08/14 1214) SpO2:  [92 %-93 %] 93 % (08/14 1214) Weight:  [74.5 kg (164 lb 3.2 oz)] 74.5 kg (164 lb 3.2 oz) (08/14 0351)  PHYSICAL EXAMINATION: General:  Pleasant woman on RA, comfortable Neuro:  Awake, alert, oriented, non-focal HEENT:  OP clear, no lesions noted Cardiovascular:  Regular, brady, soft early systolic M, no edema Lungs:  Bibasilar insp crackles, no wheezes.  Abdomen:  Soft, benign Musculoskeletal:  No deformities Skin:  No rash   Recent Labs Lab 10/09/15 0740 10/10/15 0414 10/11/15 0040 10/11/15 0442  NA 134* 135  --  135  K 3.9 3.8  --  3.7  CL 98* 94*  --  97*  CO2 26 29  --  33*  BUN 23* 27*  --  26*  CREATININE 0.92 0.98 1.03* 0.96  GLUCOSE 105* 102*  --  107*    Recent Labs Lab 10/07/15 1840 10/10/15 0414 10/11/15 0442  HGB 15.6* 14.5 13.9  HCT 46.1* 44.0 41.7  WBC 14.1* 15.0* 13.7*  PLT 294 327 297     ASSESSMENT / PLAN:  Pulmonary emboli RLL  pulmonary arterial tree  - I agree with systemic anticoagulation if she is willing to take it. Her SDH was remote and she was tolerating warfarin as recently as 2016. She would optimally need anticoagulation for life for her A fib even after the requisite 3-6 months for the PE's. It sounds like referral to start Pradaxa is pending.   Abnormal CT scan chest, pulmonary vascular congestion with basilar atx vs GGI  - her CT chest does not have findings that are typically associated with amiodarone toxicity, but the findings in chronic amio toxicity are notoriously variable. It is more likely that atelectasis and pulmonary vascular congestion explain her CT findings. Consider also pneumonitis associated with her tobacco use (RB-ILD). Although less likely given current findings, amiodarone toxicity is possible cause, and given the fact that various notes indicate that her rhythm control on the drug was suboptimal / ineffective then it may make the most sense to stop the amiodarone to eliminate it from the list of possible culprits. I believe she will need repeat CXR once mobilized and at optimal volume status, and after anticoagulated for several weeks, to assess for resolution of her infiltrates. If they persist then would repeat her CT chest, decide whether any other eval (such as bronchoscopy) would be helpful. Certainly she should stop smoking if able.  Hypoxemic resp failure  - likely due to her PE's, with possible contribution tachycardia and pulmonary infiltrates / atx. Smoking and underlying COPD likely also contributing to hypoxemia. In addition to the above, she needs PFT once stabilized from the acute event. Also outpt pulmonary follow up to review and decide whether she could benefit from bronchodilators.    Baltazar Apo, MD, PhD 10/11/2015, 2:26 PM [facility] [phone] or if no answer [phone]

## 2015-10-11 NOTE — Progress Notes (Signed)
PROGRESS NOTE    Judy White  [id number] DOB: [birth year] DOA: 10/07/2015 PCP: Henrine Screws, MD   Outpatient Specialists:     Brief Narrative:  Judy White is a 75 y.o. female with medical history significant of COPD, HTN, paroxysmal-A.Fib, patient not on blood thinners despite CHADS vasc of 4 due to history of brain bleed.  Instead patient had a new device (Watchman's) placed in November of last year in her left atrial appendage.  She has been on both rhythm control and rate control meds.  Amiodarone for several years to try and rhythm control her A.Fib/flutter; however, this hasnt really been working all that well (which is why they put in the [facility] device).  Recently she has had increased SOB, palpitations, over the past 1 week.  Chest tightness.  SOB worse with exertion, better at rest.  Productive cough and fatigue.  Cough worse when laying down    Assessment & Plan:   Principal Problem:   Chronic septic pulmonary embolism without acute cor pulmonale (HCC) Active Problems:   COPD (chronic obstructive pulmonary disease) (HCC)   On amiodarone therapy   Atrial flutter, paroxysmal (HCC)   Pulmonary edema   Interstitial lung disease (HCC)   Acute on chronic diastolic (congestive) heart failure (HCC)   Pulmonary edema: Acute on chronic diastolic CHF -                       -lasix per cards  PAF -                     per cards        -added bisporolol- now in sinus and rate <50- will lower dose place holding parameters  COPD - continue home nebs -pulm consult -home O2 study  PE/DVT -patient did not want coumadin -h/o prior bleed in 2007-- has been cleared by NS for anticoagulation -start pradaxa as has reversible agent after 5 days of lovenox -? Etiology- sedentary at work but no long trips, up to date on cancer screens-- mamogram, colonoscopy    DVT prophylaxis:  pradaxa after 5 days of lovenox  Code Status: Full Code   Family  Communication: Friend at bedside/husband at bedside  Disposition Plan:     Consultants:   cards  Procedures:        Subjective: Breathing better Asking for shower  Objective: Vitals:   10/10/15 1700 10/10/15 2008 10/11/15 0351 10/11/15 1214  BP:  (!) 99/49 (!) 99/44 (!) 111/55  Pulse: (!) 52 (!) 53 (!) 53 (!) 50  Resp:  18 18 18   Temp:  98.3 F (36.8 C) 97.6 F (36.4 C) 97.7 F (36.5 C)  TempSrc:  Oral Oral Oral  SpO2: 92% 92% 92% 93%  Weight:   74.5 kg (164 lb 3.2 oz)   Height:        Intake/Output Summary (Last 24 hours) at 10/11/15 1255 Last data filed at 10/11/15 1223  Gross per 24 hour  Intake             1100 ml  Output             2100 ml  Net            -1000 ml   Filed Weights   10/09/15 0425 10/10/15 0659 10/11/15 0351  Weight: 74.5 kg (164 lb 3.2 oz) 73.5 kg (162 lb) 74.5 kg (164 lb 3.2 oz)    Examination:  General exam: Appears  calm and comfortable  Respiratory system: diminished, no wheezing Cardiovascular system: sinus Gastrointestinal system: Abdomen is nondistended, soft and nontender. No organomegaly or masses felt. Normal bowel sounds heard. Skin: chronic skin changes on LE     Data Reviewed: I have personally reviewed following labs and imaging studies  CBC:  Recent Labs Lab 10/07/15 1840 10/10/15 0414 10/11/15 0442  WBC 14.1* 15.0* 13.7*  NEUTROABS 12.0*  --   --   HGB 15.6* 14.5 13.9  HCT 46.1* 44.0 41.7  MCV 90.0 91.3 91.4  PLT 294 327 [id number]   Basic Metabolic Panel:  Recent Labs Lab 10/07/15 1840 10/09/15 0740 10/10/15 0414 10/11/15 0040 10/11/15 0442  NA 133* 134* 135  --  135  K 3.8 3.9 3.8  --  3.7  CL 100* 98* 94*  --  97*  CO2 24 26 29   --  33*  GLUCOSE 127* 105* 102*  --  107*  BUN 13 23* 27*  --  26*  CREATININE 0.89 0.92 0.98 1.03* 0.96  CALCIUM 8.8* 8.5* 8.8*  --  8.5*   GFR: Estimated Creatinine Clearance: 51.8 mL/min (by C-G formula based on SCr of 0.96 mg/dL). Liver Function  Tests:  Recent Labs Lab 10/07/15 1840  AST 19  ALT 20  ALKPHOS 77  BILITOT 0.5  PROT 6.9  ALBUMIN 2.8*   No results for input(s): LIPASE, AMYLASE in the last 168 hours. No results for input(s): AMMONIA in the last 168 hours. Coagulation Profile: No results for input(s): INR, PROTIME in the last 168 hours. Cardiac Enzymes:  Recent Labs Lab 10/08/15 0358 10/08/15 1017 10/08/15 1555  TROPONINI <0.03 <0.03 <0.03   BNP (last 3 results) No results for input(s): PROBNP in the last 8760 hours. HbA1C: No results for input(s): HGBA1C in the last 72 hours. CBG: No results for input(s): GLUCAP in the last 168 hours. Lipid Profile: No results for input(s): CHOL, HDL, LDLCALC, TRIG, CHOLHDL, LDLDIRECT in the last 72 hours. Thyroid Function Tests:  Recent Labs  10/10/15 1508  TSH 1.588   Anemia Panel: No results for input(s): VITAMINB12, FOLATE, FERRITIN, TIBC, IRON, RETICCTPCT in the last 72 hours. Urine analysis: No results found for: COLORURINE, APPEARANCEUR, LABSPEC, [place], GLUCOSEU, HGBUR, BILIRUBINUR, KETONESUR, PROTEINUR, UROBILINOGEN, NITRITE, LEUKOCYTESUR   )No results found for this or any previous visit (from the past 240 hour(s)).    Anti-infectives    None       Radiology Studies: No results found.      Scheduled Meds: . aspirin  81 mg Oral Daily  . bisoprolol  2.5 mg Oral Daily  . cholecalciferol  1,000 Units Oral Daily  . clonazePAM  1.5 mg Oral Daily  . enoxaparin (LOVENOX) injection  1.5 mg/kg Subcutaneous Q24H  . FLUoxetine  20 mg Oral Daily  . oxybutynin  10 mg Oral Daily  . pantoprazole  40 mg Oral Daily  . sodium chloride flush  3 mL Intravenous Q12H  . sodium chloride flush  3 mL Intravenous Q12H   Continuous Infusions:    LOS: 3 days    Time spent: 25 min    [place], DO [facility] Pager [phone]  If 7PM-7AM, please contact night-coverage [email or web address] Password [facility] 10/11/2015, 12:55 PM

## 2015-10-11 NOTE — Progress Notes (Signed)
Subjective: Still SOB  SOme better  NO CP  No dizziness Today   Objective: Vitals:   10/10/15 1659 10/10/15 1700 10/10/15 2008 10/11/15 0351  BP: (!) 103/40  (!) 99/49 (!) 99/44  Pulse: (!) 47 (!) 52 (!) 53 (!) 53  Resp:   18 18  Temp:   98.3 F (36.8 C) 97.6 F (36.4 C)  TempSrc:   Oral Oral  SpO2:  92% 92% 92%  Weight:    164 lb 3.2 oz (74.5 kg)  Height:       Weight change: 2 lb 3.2 oz (0.998 kg)  Intake/Output Summary (Last 24 hours) at 10/11/15 0809 Last data filed at 10/11/15 0354  Gross per 24 hour  Intake              660 ml  Output             2000 ml  Net            -1340 ml    General: Alert, awake, oriented x3, in no acute distress Neck:  JVP is normal Heart: Regular rate and rhythm, without murmurs, rubs, gallops.  Lungs: Rales bilaterally bases   Exemities:  No edema.   Neuro: Grossly intact, nonfocal.  Teel  SR   Lab Results: Results for orders placed or performed during the hospital encounter of 10/07/15 (from the past 24 hour(s))  TSH     Status: None   Collection Time: 10/10/15  3:08 PM  Result Value Ref Range   TSH 1.588 0.350 - 4.500 uIU/mL  Creatinine, serum     Status: Abnormal   Collection Time: 10/11/15 12:40 AM  Result Value Ref Range   Creatinine, Ser 1.03 (H) 0.44 - 1.00 mg/dL   GFR calc non Af Amer 51 (L) >60 mL/min   GFR calc Af Amer 59 (L) >60 mL/min  Basic metabolic panel     Status: Abnormal   Collection Time: 10/11/15  4:42 AM  Result Value Ref Range   Sodium 135 135 - 145 mmol/L   Potassium 3.7 3.5 - 5.1 mmol/L   Chloride 97 (L) 101 - 111 mmol/L   CO2 33 (H) 22 - 32 mmol/L   Glucose, Bld 107 (H) 65 - 99 mg/dL   BUN 26 (H) 6 - 20 mg/dL   Creatinine, Ser 0.96 0.44 - 1.00 mg/dL   Calcium 8.5 (L) 8.9 - 10.3 mg/dL   GFR calc non Af Amer 56 (L) >60 mL/min   GFR calc Af Amer >60 >60 mL/min   Anion gap 5 5 - 15  CBC     Status: Abnormal   Collection Time: 10/11/15  4:42 AM  Result Value Ref Range   WBC 13.7 (H) 4.0 - 10.5  K/uL   RBC 4.56 3.87 - 5.11 MIL/uL   Hemoglobin 13.9 12.0 - 15.0 g/dL   HCT 41.7 36.0 - 46.0 %   MCV 91.4 78.0 - 100.0 fL   MCH 30.5 26.0 - 34.0 pg   MCHC 33.3 30.0 - 36.0 g/dL   RDW 12.9 11.5 - 15.5 %   Platelets 297 150 - 400 K/uL  Sedimentation rate     Status: Abnormal   Collection Time: 10/11/15  4:42 AM  Result Value Ref Range   Sed Rate 30 (H) 0 - 22 mm/hr    Studies/Results: No results found.  Medications:REviewed    '@PROBHOSP' @ 1  Dyspnea.  ? Related etiology  Pt with PE but smaller  ? Related to  amio  ? Fluid On exam, fluid status may be up a little  Not severe   ESR done  Was only minimally elevated  Unusual for amio tox  Med is on hold  Pt in SR    Will ask pulm to see / eval   1  PAF  Had been on amiodarone for years but back in afib on admit    Avoiding anticoag due to hx of ICH  Also with watchman    2  Pulmonary embolism  Currently on Lovenox  No recent long distance trip  No injury  Pt is ambulating on admit    LOS: 3 days   Dorris Carnes 10/11/2015, 8:09 AM

## 2015-10-11 NOTE — Progress Notes (Signed)
Patient set up to watch Patient Education Video 108 on Lovenox.  Taught patient how Lovenox injections are given when her 4pm dose administered.  Stated understanding.

## 2015-10-12 MED ORDER — BISOPROLOL FUMARATE 5 MG PO TABS: 2.5000 mg | ORAL_TABLET | Freq: Every day | ORAL | 0 refills | Status: DC

## 2015-10-12 MED ORDER — DABIGATRAN ETEXILATE MESYLATE 150 MG PO CAPS: 150.0000 mg | ORAL_CAPSULE | Freq: Two times a day (BID) | ORAL | 0 refills | Status: DC

## 2015-10-12 MED ORDER — ENOXAPARIN SODIUM 120 MG/0.8ML ~~LOC~~ SOLN: 120.0000 mg | Freq: Once | SUBCUTANEOUS | 0 refills | Status: DC

## 2015-10-12 NOTE — Progress Notes (Signed)
   Subjective: Pt denies SOB or CP   Objective: Vitals:   10/11/15 2119 10/12/15 0428 10/12/15 0430 10/12/15 0530  BP: (!) 100/42 (!) 97/46    Pulse: (!) 56 (!) 52    Resp: 18 16    Temp: 98.7 F (37.1 C) 98.7 F (37.1 C)    TempSrc: Oral Oral    SpO2: 92% (!) 88% 92%   Weight:    163 lb 9.3 oz (74.2 kg)  Height:       Weight change: -9.9 oz (-0.281 kg)  Intake/Output Summary (Last 24 hours) at 10/12/15 0837 Last data filed at 10/12/15 0400  Gross per 24 hour  Intake             1020 ml  Output             1250 ml  Net             -230 ml    General: Alert, awake, oriented x3, in no acute distress Neck:  JVP is normal Heart: Regular rate and rhythm, without murmurs, rubs, gallops.  Lungs: Clear to auscultation.  No rales or wheezes. Exemities:  No edema.   Neuro: Grossly intact, nonfocal.  TEle  SR and afib    Lab Results: No results found for this or any previous visit (from the past 24 hour(s)).  Studies/Results: No results found.  Medications:Reviewed   @PROBHOSP @  1  Dyspnea  Pt has been seen by pulmonary They will follow as outpt    2  PAF In and out of afib  WIll keep on same meds with discontinuation of amio  ON anticoagulatoni   3  PE   Anticoagulate  Pt OK for d/c from cardiac standpoint  Will make sure that she has f/u in clinic in 2 to 3 wks     LOS: 4 days   Judy White 10/12/2015, 8:37 AM

## 2015-10-12 NOTE — Progress Notes (Signed)
Patient was having some episode of atrial fibrillation in the monitor in and out from sinus brady but non-sustained afib. Patients SBP was in low 90's with slight pressure in the chest with episode of afib. On call fellow cardiology was paged and notified of patient symptoms and rhythm at  0512.  At present patient comfortable and rhythm back to sinus bradycardia again. Will follow up with cardiology this am patient symptoms.

## 2015-10-12 NOTE — Progress Notes (Signed)
Name: Judy White MRN: [id number] DOB: [birth year]    ADMISSION DATE:  10/07/2015 CONSULTATION DATE:  10/11/15  REFERRING MD :  Dr Harrington Challenger, Cardiology  REASON FOR CONSULTATION:  Pulmonary emboli, Abnormal CT scan Chest  STUDIES:  CT-PA 8/11 >> several RLL pulmonary emboli, evidence for pulmonary vascular congestion with some basilar more confluent GG change LE doppler US 8/12 >> RLE DVT   HISTORY OF PRESENT ILLNESS:  75 yo woman with hx A fib and watchman placement, on amiodarone but not anti-coag since 01/2015 due to concern regarding hx of SDH in 2007. Also w hx HTN, tobacco use and COPD. She was admitted with light-headedness, near-syncope 8/11, found to be hypoxemic. CT chest 8/11 showed scattered RLL PE's, some evidence for interstitial edema and GGI. LE doppler study showed RLE DVT. She has been started on therapeutic enoxaparin. PCCM consulted regarding her PE / anticoagulation, as well as her abnormal CT chest.   SUBJECTIVE:   VITAL SIGNS: Temp:  [97.7 F (36.5 C)-98.7 F (37.1 C)] 98.7 F (37.1 C) (08/15 0428) Pulse Rate:  [50-58] 52 (08/15 0428) Resp:  [16-18] 16 (08/15 0428) BP: (97-111)/(42-55) 97/46 (08/15 0428) SpO2:  [88 %-93 %] 92 % (08/15 0430) Weight:  [74.2 kg (163 lb 9.3 oz)] 74.2 kg (163 lb 9.3 oz) (08/15 0530)  PHYSICAL EXAMINATION: General:  Pleasant woman on RA, comfortable Neuro:  Awake, alert, oriented, non-focal HEENT:  OP clear, no lesions noted Cardiovascular:  Regular, brady, soft early systolic M, no edema Lungs:  Bibasilar insp crackles, no wheezes.  Abdomen:  Soft, benign Musculoskeletal:  No deformities Skin:  No rash   Recent Labs Lab 10/09/15 0740 10/10/15 0414 10/11/15 0040 10/11/15 0442  NA 134* 135  --  135  K 3.9 3.8  --  3.7  CL 98* 94*  --  97*  CO2 26 29  --  33*  BUN 23* 27*  --  26*  CREATININE 0.92 0.98 1.03* 0.96  GLUCOSE 105* 102*  --  107*    Recent Labs Lab 10/07/15 1840 10/10/15 0414 10/11/15 0442  HGB 15.6*  14.5 13.9  HCT 46.1* 44.0 41.7  WBC 14.1* 15.0* 13.7*  PLT 294 327 297     ASSESSMENT / PLAN:  Pulmonary emboli RLL pulmonary arterial tree - plan systemic anticoagulation: enoxaparin > pradaxa. She would optimally need anticoagulation for life for her A fib even after the requisite 3-6 months for the PE's.   Abnormal CT scan chest, pulmonary vascular congestion with basilar atx vs GGI  - her CT chest does not have findings that are typically associated with amiodarone toxicity, but the findings in chronic amio toxicity are variable. It is more likely that atelectasis and pulmonary vascular congestion explain her CT findings. Consider also pneumonitis associated with her tobacco use (RB-ILD). Although less likely given current findings, amiodarone toxicity is possible cause. Agree with discontinuation with amiodarone, particularly if it is deemed that it has been ineffective.   I will plan for outpt follow up; Will plan for CXR once mobilized and at optimal volume status, and after anticoagulated for several weeks, to assess for resolution of her infiltrates. If they persist then would repeat her CT chest, decide whether any other eval (such as bronchoscopy) would be helpful. Certainly she should stop smoking if able.   Hypoxemic resp failure  - likely due to her PE's, with possible contribution tachycardia and pulmonary infiltrates / atx. Smoking and underlying COPD likely also contributing to hypoxemia. She needs  PFT once stabilized from the acute event. Also outpt pulmonary follow up to review and decide whether she could benefit from bronchodilators. I have set her to follow with me on 11/25/15 at 4:15pm.   Please call if we can assist further.    Baltazar Apo, MD, PhD 10/12/2015, 10:51 AM [facility] [phone] or if no answer [phone]

## 2015-10-12 NOTE — Progress Notes (Signed)
Patient given discharge instructions and all questions answered.  Patient discharged via wheelchair with all belongings.   

## 2015-10-12 NOTE — Progress Notes (Signed)
Home oxygen ordered as requested and to be delivered to the room today prior to discharging home. B Lavona Norsworthy RN,MHA,BSN [phone] 

## 2015-10-12 NOTE — Discharge Summary (Addendum)
Physician Discharge Summary  Judy White [id number] DOB: December 31, 1940 DOA: 10/07/2015  PCP: Henrine Screws, MD  Admit date: 10/07/2015 Discharge date: 10/12/2015   Recommendations for Outpatient Follow-Up:   1. Outpatient PFTs with pulm 2. Home O2 3. Smoking cessation   Discharge Diagnosis:   Principal Problem:   PE (pulmonary embolism) Active Problems:   COPD (chronic obstructive pulmonary disease) (HCC)   On amiodarone therapy   Atrial flutter, paroxysmal (HCC)   Pulmonary edema   Interstitial lung disease (HCC)   Acute on chronic diastolic (congestive) heart failure (HCC)   Abnormal CT of the chest   Discharge disposition:  Home.  Discharge Condition: Improved.  Diet recommendation: Low sodium, heart healthy  Wound care: None.   History of Present Illness:   Judy White is a 75 y.o. female with medical history significant of COPD, HTN, paroxysmal-A.Fib, patient not on blood thinners despite CHADS vasc of 4 due to history of brain bleed.  Instead patient had a new device (Watchman's) placed in November of last year in her left atrial appendage.  She has been on both rhythm control and rate control meds.  Amiodarone for several years to try and rhythm control her A.Fib/flutter; however, this hasnt really been working all that well (which is why they put in the [facility] device).  Recently she has had increased SOB, palpitations, over the past 1 week.  Chest tightness.  SOB worse with exertion, better at rest.  Productive cough and fatigue.  Cough worse when laying down   Hospital Course by Problem:   pulm emboli right and left DVT -lovenox shots x 5 days then pradaxa-- used as it is reversible  Abnormal CT scan of chest -outpatient follow up with pulm arranged  pulm edema due to a fib -back on PO lasix  A fib -per cards -off amio, cardizem -low dose BB -cardiology follow up  Hypoxemic resp failure acute -needs 2L O2 at home --outpatient  PFTs   Tobacco abuse Encouraged cessation esp with O2 need    Medical Consultants:    Cards  pulm   Discharge Exam:   Vitals:   10/12/15 0428 10/12/15 1147  BP: (!) 97/46 (!) 100/53  Pulse: (!) 52 (!) 51  Resp: 16 18  Temp: 98.7 F (37.1 C) 98.2 F (36.8 C)   Vitals:   10/12/15 0428 10/12/15 0430 10/12/15 0530 10/12/15 1147  BP: (!) 97/46   (!) 100/53  Pulse: (!) 52   (!) 51  Resp: 16   18  Temp: 98.7 F (37.1 C)   98.2 F (36.8 C)  TempSrc: Oral   Oral  SpO2: (!) 88% 92%  95%  Weight:   74.2 kg (163 lb 9.3 oz)   Height:        Gen:  NAD    The results of significant diagnostics from this hospitalization (including imaging, microbiology, ancillary and laboratory) are listed below for reference.     Procedures and Diagnostic Studies:   Dg Chest 2 View  Result Date: 10/07/2015 CLINICAL DATA:  Central chest pain, shortness of breath, dizziness and nausea. EXAM: CHEST  2 VIEW COMPARISON:  01/01/2015 FINDINGS: Left arterial appendage occlusion device is stable. Cardiomediastinal silhouette is mildly enlarged. Mediastinal contours appear intact. There is no evidence of pneumothorax. There are increased interstitial markings which may be seen with interstitial pulmonary edema. An element of chronic interstitial lung disease is also present with associated chronic hyperinflation of the lungs. There is mild blunting of the  costophrenic angles which may be seen with small pleural effusions. There is right apical pleural thickening. Osseous structures are without acute abnormality. Soft tissues are grossly normal. IMPRESSION: Mildly enlarged heart. Interstitial pulmonary edema on the background of chronic interstitial lung disease. Chronic right apical pleural thickening, relatively stable when compared to the 2014 radiographs, which likely represents scarring. Electronically Signed   By: Fidela Salisbury M.D.   On: 10/07/2015 19:48   Ct Head Wo Contrast  Result Date:  10/08/2015 CLINICAL DATA:  Initial evaluation for acute headache. EXAM: CT HEAD WITHOUT CONTRAST TECHNIQUE: Contiguous axial images were obtained from the base of the skull through the vertex without intravenous contrast. COMPARISON:  Prior CT from 08/01/2010. FINDINGS: Scalp soft tissues within normal limits. No acute abnormality about the globes and orbits. Patient is status post lens extraction bilaterally. Paranasal sinuses are clear.  No mastoid effusion. Calvarium intact. Mild age-related cerebral atrophy with chronic microvascular ischemic disease. No acute intracranial hemorrhage or evidence of large vessel territory infarct. No mass lesion, midline shift, or mass effect. No hydrocephalus. No extra-axial fluid collection. IMPRESSION: 1. No acute intracranial process. 2. Mild age-related cerebral atrophy with chronic small vessel ischemic disease. Electronically Signed   By: Jeannine Boga M.D.   On: 10/08/2015 02:22   Ct Angio Chest Pe W Or Wo Contrast  Result Date: 10/08/2015 CLINICAL DATA:  Chest pain, shortness of breath, productive cough, fatigue, near syncope. EXAM: CT ANGIOGRAPHY CHEST WITH CONTRAST TECHNIQUE: Multidetector CT imaging of the chest was performed using the standard protocol during bolus administration of intravenous contrast. Multiplanar CT image reconstructions and MIPs were obtained to evaluate the vascular anatomy. CONTRAST:  62 cc Isovue 370 COMPARISON:  Chest radiographs obtained yesterday. FINDINGS: Mediastinum/Lymph Nodes: Several small right lower lobe pulmonary arterial filling defects. No enlarged lymph nodes. The right ventricular to left ventricular ratio is 0.78. Lungs/Pleura: Prominent pulmonary vasculature and interstitial markings throughout both lungs. Small amount of dependent atelectasis bilaterally. Diffuse peribronchial thickening. No lung nodules or pleural fluid. Upper abdomen: No acute findings. Musculoskeletal: Thoracic spine degenerative changes.  Review of the MIP images confirms the above findings. IMPRESSION: 1. Several small right lower lobe pulmonary emboli. 2. No evidence of right heart strain. 3. Pulmonary vascular congestion, chronic interstitial lung disease and possible superimposed interstitial pulmonary edema. 4. Chronic bronchitic changes. Critical Value/emergent results were called by telephone at the time of interpretation on 10/08/2015 at 10:32 pm to [facility], the patient's nurse , who verbally acknowledged these results. Electronically Signed   By: Claudie Revering M.D.   On: 10/08/2015 22:34     Labs:   Basic Metabolic Panel:  Recent Labs Lab 10/07/15 1840 10/09/15 0740 10/10/15 0414 10/11/15 0040 10/11/15 0442  NA 133* 134* 135  --  135  K 3.8 3.9 3.8  --  3.7  CL 100* 98* 94*  --  97*  CO2 24 26 29   --  33*  GLUCOSE 127* 105* 102*  --  107*  BUN 13 23* 27*  --  26*  CREATININE 0.89 0.92 0.98 1.03* 0.96  CALCIUM 8.8* 8.5* 8.8*  --  8.5*   GFR Estimated Creatinine Clearance: 51.6 mL/min (by C-G formula based on SCr of 0.96 mg/dL). Liver Function Tests:  Recent Labs Lab 10/07/15 1840  AST 19  ALT 20  ALKPHOS 77  BILITOT 0.5  PROT 6.9  ALBUMIN 2.8*   No results for input(s): LIPASE, AMYLASE in the last 168 hours. No results for input(s): AMMONIA in the last  168 hours. Coagulation profile No results for input(s): INR, PROTIME in the last 168 hours.  CBC:  Recent Labs Lab 10/07/15 1840 10/10/15 0414 10/11/15 0442  WBC 14.1* 15.0* 13.7*  NEUTROABS 12.0*  --   --   HGB 15.6* 14.5 13.9  HCT 46.1* 44.0 41.7  MCV 90.0 91.3 91.4  PLT 294 327 297   Cardiac Enzymes:  Recent Labs Lab 10/08/15 0358 10/08/15 1017 10/08/15 1555  TROPONINI <0.03 <0.03 <0.03   BNP: Invalid input(s): POCBNP CBG: No results for input(s): GLUCAP in the last 168 hours. D-Dimer No results for input(s): DDIMER in the last 72 hours. Hgb A1c No results for input(s): HGBA1C in the last 72 hours. Lipid Profile No  results for input(s): CHOL, HDL, LDLCALC, TRIG, CHOLHDL, LDLDIRECT in the last 72 hours. Thyroid function studies  Recent Labs  10/10/15 1508  TSH 1.588   Anemia work up No results for input(s): VITAMINB12, FOLATE, FERRITIN, TIBC, IRON, RETICCTPCT in the last 72 hours. Microbiology No results found for this or any previous visit (from the past 240 hour(s)).   Discharge Instructions:   Discharge Instructions    Diet - low sodium heart healthy    Complete by:  As directed   Increase activity slowly    Complete by:  As directed       Medication List    STOP taking these medications   diltiazem 120 MG 24 hr capsule Commonly known as:  CARDIZEM CD     TAKE these medications   aspirin 81 MG tablet Take 81 mg by mouth daily.   bisoprolol 5 MG tablet Commonly known as:  ZEBETA Take 0.5 tablets (2.5 mg total) by mouth daily.   CALTRATE 600 PLUS-VIT D PO Take 1 tablet by mouth daily.   cholecalciferol 1000 units tablet Commonly known as:  VITAMIN D Take 1,000 Units by mouth daily.   clonazePAM 1 MG tablet Commonly known as:  KLONOPIN Take 1.5-2 tablets by mouth daily.   dabigatran 150 MG Caps capsule Commonly known as:  PRADAXA Take 1 capsule (150 mg total) by mouth 2 (two) times daily. START 8/17 AM   enoxaparin 120 MG/0.8ML injection Commonly known as:  LOVENOX Inject 0.8 mLs (120 mg total) into the skin once. Start taking on:  10/13/2015   FISH OIL PO Take 1 tablet by mouth daily.   FLUoxetine 20 MG capsule Commonly known as:  PROZAC Take 20 mg by mouth daily.   furosemide 20 MG tablet Commonly known as:  LASIX Take 1 tablet (20 mg total) by mouth daily.   oxybutynin 10 MG 24 hr tablet Commonly known as:  DITROPAN-XL Take 1 tablet by mouth daily.   Potassium 75 MG Tabs Take 1 tablet by mouth daily.   VITAMIN B-12 PO Take 1 tablet by mouth daily.   VITAMIN C PO Take 500 mg by mouth daily.      Follow-up Information    Collene Gobble., MD  Follow up on 11/25/2015.   Specialty:  Pulmonary Disease Why:  4:15pm Contact information: [address] [place] [address] [phone]        Cecilie Kicks, NP Follow up on 10/26/2015.   Specialties:  Cardiology, Radiology Why:  8:30am for your hospital follow up with Dr. Thompson Caul NP Natasha Bence information: [place] [address] [phone]            Time coordinating discharge: 35 min  Signed:  Djibril Glogowski U Athea Haley   [facility] 10/12/2015, 1:56 PM

## 2015-10-12 NOTE — Progress Notes (Signed)
SATURATION QUALIFICATIONS: (This note is used to comply with regulatory documentation for home oxygen)  Patient Saturations on Room Air at Rest = 90%  Patient Saturations on Room Air while Ambulating = 82-84%  Patient Saturations on 2 Liters of oxygen while Ambulating = 89-90%  Please briefly explain why patient needs home oxygen:

## 2015-10-18 ENCOUNTER — Encounter: Payer: Self-pay | Admitting: Interventional Cardiology

## 2015-10-19 DIAGNOSIS — R0600 Dyspnea, unspecified: Secondary | ICD-10-CM | POA: Diagnosis not present

## 2015-10-19 DIAGNOSIS — I2699 Other pulmonary embolism without acute cor pulmonale: Secondary | ICD-10-CM | POA: Diagnosis not present

## 2015-10-19 DIAGNOSIS — R0689 Other abnormalities of breathing: Secondary | ICD-10-CM | POA: Diagnosis not present

## 2015-10-25 ENCOUNTER — Other Ambulatory Visit: Payer: Self-pay

## 2015-10-25 ENCOUNTER — Telehealth: Payer: Self-pay | Admitting: Emergency Medicine

## 2015-10-25 NOTE — Telephone Encounter (Signed)
Spoke with patient- she was in hospital 10-07-15 and was told to follow up with RB-appt scheduled 11-25-15 but Outpt details states as below:   Recommendations for Outpatient Follow-Up:   1. Outpatient PFTs with pulm 2. Home O2 3. Smoking cessation   RB, pt would like to know if you want to see her any sooner and should she have PFT prior to being seen? Also, what specifics should pt do while waiting for post hospital. Thanks.

## 2015-10-25 NOTE — Progress Notes (Signed)
Cardiology Office Note   Date:  10/26/2015   ID:  Judy White, DOB 11/01/1940, MRN [id number]  PCP:  Henrine Screws, MD  Cardiologist:  Dr. Tamala Julian   EP:  Dr. Rayann Heman  Chief Complaint  Patient presents with  . Hospitalization Follow-up      History of Present Illness: Judy White is a 75 y.o. female who presents for post hospitalization 10/07/15 to 10/12/15.   She has a medical history significant of, HTN, paroxysmal-A.Fib, patient not on blood thinners despite CHADS vasc of 4 due to history of brain bleed. Instead patient had a new device (Watchman's) placed in November of last year in her left atrial appendage,most probably due to her lack of response to Amiodarone which she was taking for many years. Recent admit with having worsening fatigue, chest tightness accompanied with SOB, palpitation over the last 2-3 wks. Yesterday around noon while at work she became dizzy and lightheaded with near passing out state, which prompt her to seek medical attension.She Stated that recently she becomes SOB while just walking down the hall and unable to lay flat due to SOB. She was diagnosed with Rt DVT and scattered small PE on CT.  She was having runs of pafib and resumed amiodarone but she refused with issues in the past.  Concern for amiodarone toxicity and pulmonary consult was obtained.  He did not believe she had amiodarone toxicity but amio was stopped.    Dr. Lamonte Sakai "I believe she will need repeat CXR once mobilized and at optimal volume status, and after anticoagulated for several weeks, to assess for resolution of her infiltrates. If they persist then would repeat her CT chest, decide whether any other eval (such as bronchoscopy) would be helpful. Certainly she should stop smoking if able. "  Amiodarone stopped. She is on  Home O2.    She feels better though still needs oxygen.  Using 02 at 3 L.  No chest pain.  No awareness of rapid HR.  She did not understand why she does not follow up  with Dr. Lamonte Sakai until end of Sept but he made the appointment to give still with SOB with exertion.  Past Medical History:  Diagnosis Date  . Agoraphobia   . Anxiety   . Arthritis    "normal for the age; nothing gives me problems" (10/08/2015)  . COPD (chronic obstructive pulmonary disease) ([facility])    "mild" (10/08/2015)  . Depressive disorder, not elsewhere classified   . Hypercholesteremia   . Hypertension   . Myalgia and myositis, unspecified   . Osteopenia   . Overactive bladder   . Paroxysmal atrial fibrillation (HCC)    a. s/p Watchman implant 12/2014  . Pneumonia X 1    Past Surgical History:  Procedure Laterality Date  . APPENDECTOMY  1983  . BACK SURGERY    . CARDIAC CATHETERIZATION     >5 yrs  . CATARACT EXTRACTION W/ INTRAOCULAR LENS  IMPLANT, BILATERAL Bilateral   . CESAREAN SECTION  1967  . LEFT ATRIAL APPENDAGE OCCLUSION N/A 12/31/2014   Procedure: LEFT ATRIAL APPENDAGE OCCLUSION;  Surgeon: Thompson Grayer, MD;  Location: [facility];  Service: Cardiovascular;  Laterality: N/A;  . POSTERIOR FUSION LUMBAR SPINE Bilateral 07/2014  . SHOULDER ARTHROSCOPY W/ ROTATOR CUFF REPAIR Left   . SHOULDER SURGERY Right    "tendon broke; couldn't be repaired"  . TEE WITHOUT CARDIOVERSION N/A 12/22/2014   Procedure: TRANSESOPHAGEAL ECHOCARDIOGRAM (TEE);  Surgeon: Pixie Casino, MD;  Location:  [facility];  Service: Cardiovascular;  Laterality: N/A;  . TEE WITHOUT CARDIOVERSION N/A 02/16/2015   post Watchman TEE with good seal and no leak around device   . TONSILLECTOMY    . TOTAL ABDOMINAL HYSTERECTOMY    . WISDOM TOOTH EXTRACTION       Current Outpatient Prescriptions  Medication Sig Dispense Refill  . Ascorbic Acid (VITAMIN C PO) Take 500 mg by mouth daily.     Marland Kitchen aspirin 81 MG tablet Take 81 mg by mouth daily.    . bisoprolol (ZEBETA) 5 MG tablet Take 0.5 tablets (2.5 mg total) by mouth daily. 30 tablet 0  . Calcium-Vitamin D (CALTRATE 600 PLUS-VIT D PO) Take 1  tablet by mouth daily.     . cholecalciferol (VITAMIN D) 1000 UNITS tablet Take 1,000 Units by mouth daily.    . clonazePAM (KLONOPIN) 1 MG tablet Take 1.5-2 tablets by mouth daily.     . Cyanocobalamin (VITAMIN B-12 PO) Take 1 tablet by mouth daily.     . dabigatran (PRADAXA) 150 MG CAPS capsule Take 1 capsule (150 mg total) by mouth 2 (two) times daily. START 8/17 AM 60 capsule 0  . FLUoxetine (PROZAC) 20 MG capsule Take 20 mg by mouth daily.     . furosemide (LASIX) 20 MG tablet Take 1 tablet (20 mg total) by mouth daily. 90 tablet 3  . Omega-3 Fatty Acids (FISH OIL PO) Take 1 tablet by mouth daily.     Marland Kitchen oxybutynin (DITROPAN-XL) 10 MG 24 hr tablet Take 1 tablet by mouth daily.    . Potassium 75 MG TABS Take 1 tablet by mouth daily.     No current facility-administered medications for this visit.     Allergies:   Review of patient's allergies indicates no known allergies.    Social History:  The patient  reports that she quit smoking about 2 weeks ago. Her smoking use included Cigarettes. She has a 30.00 pack-year smoking history. She has never used smokeless tobacco. She reports that she drinks alcohol. She reports that she does not use drugs.   Family History:  The patient's family history includes Aneurysm in her father; Heart disease in her brother; Hypotension in her mother; Neuropathy in her brother.    ROS:  General:no colds or fevers, mild weight increase from hospital Skin:no rashes or ulcers HEENT:no blurred vision, no congestion CV:see HPI PUL:see HPI GI:no diarrhea constipation or melena, no indigestion GU:no hematuria, no dysuria MS:no joint pain, no claudication Neuro:no syncope, no lightheadedness Endo:no diabetes, no thyroid disease  Wt Readings from Last 3 Encounters:  10/26/15 167 lb 12.8 oz (76.1 kg)  10/25/15 168 lb (76.2 kg)  10/12/15 163 lb 9.3 oz (74.2 kg)     PHYSICAL EXAM: VS:  BP 110/60   Pulse (!) 44   Ht 5\' 5"  (1.651 m)   Wt 167 lb 12.8 oz  (76.1 kg)   SpO2 93% Comment: 3 LITERS  BMI 27.92 kg/m  , BMI Body mass index is 27.92 kg/m. General:Pleasant affect, NAD Skin:Warm and dry, brisk capillary refill HEENT:normocephalic, sclera clear, mucus membranes moist Neck:supple, no JVD, no bruits  Heart:S1S2 RRR without murmur, gallup, rub or click Lungs:clear without rales, rhonchi, or wheezes [id number], non tender, + BS, do not palpate liver spleen or masses Ext:no lower ext edema, 2+ pedal pulses, 2+ radial pulses Neuro:alert and oriented, MAE, follows commands, + facial symmetry    EKG:  EKG is ordered today. The ekg ordered today demonstrates S Loletha Grayer  at 44 no changes from previous.     Recent Labs: 10/07/2015: ALT 20; B Natriuretic Peptide 125.2 10/10/2015: TSH 1.588 10/11/2015: BUN 26; Creatinine, Ser 0.96; Hemoglobin 13.9; Platelets 297; Potassium 3.7; Sodium 135    Lipid Panel No results found for: CHOL, TRIG, HDL, CHOLHDL, VLDL, LDLCALC, LDLDIRECT     Other studies Reviewed: Additional studies/ records that were reviewed today include: . Echo: Study Conclusions  - Left ventricle: The cavity size was normal. Systolic function was   normal. Wall motion was normal; there were no regional wall   motion abnormalities. The study is not technically sufficient to   allow evaluation of LV diastolic function. - Aortic valve: Transvalvular velocity was within the normal range.   There was no stenosis. There was no regurgitation. - Mitral valve: Transvalvular velocity was within the normal range.   There was no evidence for stenosis. There was no regurgitation. - Right ventricle: The cavity size was normal. Wall thickness was   normal. Systolic function was normal. - Atrial septum: No defect or patent foramen ovale was identified   by color flow Doppler. - Tricuspid valve: There was trivial regurgitation. - Pulmonary arteries: Systolic pressure was within the normal   range. PA peak pressure: 28 mm Hg (   Venous  dopplers: Summary:  - No evidence of acute deep vein thrombosis involving the right   posterial tibial vein and right peroneal vein. - Left PT and peroneal veins wtih acute thrombus. - No evidence of Baker&'s cyst on the right or left.  ASSESSMENT AND PLAN:   1  Dyspnea  Pt has been seen by pulmonary They will follow as outpt  she will have outpt PFTs and CXR.  She con't on home oxygen   2  PAF In and out of afib  WIll keep on same meds with discontinuation of amio  ON anticoagulation and with watchman placed earlier this year. Currently maintaining SB.     3  PE   Anticoagulated on Pradaxa no bleeding.  4. Sinus brady on 2.5 mg of zebeta, has been low freq and she is asymptomatic.  Will continue to monitor.  I did order the 2.5 mg pill as she has trouble cutting 5 mg tab in half.   If she has symptoms of dizziness would decrease        Current medicines are reviewed with the patient today.  The patient Has no concerns regarding medicines.  The following changes have been made:  See above Labs/ tests ordered today include:see above  Disposition:   FU:  see above  Signed, Cecilie Kicks, NP  10/26/2015 9:18 AM    [facility] [address], [place], [place], [place] Phone: [phone]; Fax: [phone]

## 2015-10-25 NOTE — Patient Outreach (Signed)
Judy White) Care Management  10/25/2015  Mellanie Altschuler Scovill 06/12/1940 [id number]    Telephonic Screening and Initial Assessment   Referral Date: 10/22/15 Referral Source:  [facility] Stroke Program Dashboard Alert.  Patient eligible for [facility] services.  Issue: Questions/problems with meds? yes Insurance: [employer] and Optician, dispensing for [employer]  Subjective: Patient states she prefers to talk to a human versus the [employer] system.  Patient states she is not aware of having a stroke.  RN CM reviewed discharge summary and did not find diagnosis of stroke.  Confirms no medication questions or medication needs.  Patient states she has an appt with the Cardiologist tomorrow.  Patient has questions regarding her activity level.    Providers: Primary MD:  Josetta Huddle -  last appt: 10/19/15    Cardiologist: Dr. Thompson Caul NP Cecilie Kicks  - next appt: 10/26/15 Pulmonologist:  Dr. Baltazar Apo - next appt 11/25/15  HH: none   Social: Patient lives in the home with husband and son.  Mobility: Ambulates with no assistive devices.  Falls: none Pain: none Depression: none Transportation: yes Caregiver: husband Emergency Contact: husband Advance Directive: yes; MD does not have copy. Consent:  Yes - agrees to [facility] community services.  DME: eye glasses, oxygen  THN conditions:  CHF, COPD, HTN, Paroxysmal atrial fibrillation On Amiodarone therapy, Pulmonary edema, Interstitial lung disease, Pulmonary embolism 10/07/15, Anxiety.  H/o Insertion of left atrial appendage occluder (Watchman) on 12/31/14 by Dr Rayann Heman and Dr Burt Knack.   Admissions: (1)  10/07/2015 - 10/12/2015 PE (pulmonary embolism) ER visits: (0) Last recorded vitals 10/19/2015 per [facility] BP: 128/76 Weight: 168 lb Height: 65 in BMI 27.95 Home Oxygen 2.5 L/m via [place].   Medications:  Patient taking less than 15 medications  Co-pay cost issues: None Flu Vaccine: 11/18/14 Pneumovax (PPSV2): 05/28/2002 Prevnar (PCV13):  06/23/2013 tDAP: Pharmacy:  Medication Reconciliation completed with patient 10/25/15  Objective:   Encounter Medications:  Outpatient Encounter Prescriptions as of 10/25/2015  Medication Sig Note  . Ascorbic Acid (VITAMIN C PO) Take 500 mg by mouth daily.    Marland Kitchen aspirin 81 MG tablet Take 81 mg by mouth daily.   . bisoprolol (ZEBETA) 5 MG tablet Take 0.5 tablets (2.5 mg total) by mouth daily.   . Calcium-Vitamin D (CALTRATE 600 PLUS-VIT D PO) Take 1 tablet by mouth daily.    . cholecalciferol (VITAMIN D) 1000 UNITS tablet Take 1,000 Units by mouth daily.   . clonazePAM (KLONOPIN) 1 MG tablet Take 1.5-2 tablets by mouth daily.  08/05/2014: .  Marland Kitchen Cyanocobalamin (VITAMIN B-12 PO) Take 1 tablet by mouth daily.    . dabigatran (PRADAXA) 150 MG CAPS capsule Take 1 capsule (150 mg total) by mouth 2 (two) times daily. START 8/17 AM   . FLUoxetine (PROZAC) 20 MG capsule Take 20 mg by mouth daily.  08/05/2014: .  . furosemide (LASIX) 20 MG tablet Take 1 tablet (20 mg total) by mouth daily.   . Omega-3 Fatty Acids (FISH OIL PO) Take 1 tablet by mouth daily.    Marland Kitchen oxybutynin (DITROPAN-XL) 10 MG 24 hr tablet Take 1 tablet by mouth daily. 08/05/2014: .  Marland Kitchen Potassium 75 MG TABS Take 1 tablet by mouth daily.   Marland Kitchen enoxaparin (LOVENOX) 120 MG/0.8ML injection Inject 0.8 mLs (120 mg total) into the skin once.    No facility-administered encounter medications on file as of 10/25/2015.     Functional Status:  In your present state of health, do you have  any difficulty performing the following activities: 10/25/2015 10/08/2015  Hearing? N N  Vision? N N  Difficulty concentrating or making decisions? N N  Walking or climbing stairs? N Y  Dressing or bathing? N N  Doing errands, shopping? N -  Preparing Food and eating ? N -  Using the Toilet? N -  In the past six months, have you accidently leaked urine? N -  Do you have problems with loss of bowel control? N -  Managing your Medications? N -  Managing your Finances?  N -  Housekeeping or managing your Housekeeping? N -  Some recent data might be hidden    Fall/Depression Screening: PHQ 2/9 Scores 10/25/2015 10/25/2015  PHQ - 2 Score 0 0   Fall Risk  10/25/2015  Falls in the past year? No    Preventives: Hearing: never tested. States no issues.  Eyes: Dr. Luberta Mutter - last appt  09/29/15 Dentist: Dr. Yong Channel - last appt 6 months ago 8/30 appt cancelled but plans to reschedule. Podiatrist: not needed Mammogram: 12/18/14  Bone Density:  Yes; states does not need yearly.  Colonoscopy:  07/12/2007  Plan:  Referral Date:  10/22/2015 Screening and Initial Assessment Date: 10/22/2015  [facility] RN CM Referral 10/22/15 -Disease management -Admission within the last 30 days.   Atrial Fibrillation -THN A-Fib Booklet mailed to patient.   Activities:  Per Discharge order:  Increase activity slowly.  RN CM advised to manage self needs at this time such as showering, ambulation within the house and self care such as getting something to eat.  Advised to allow husband to manage other house duties until MD releases to additional activity level.  RN CM encouraged patient to prepare a list of questions to discuss on MD appt 10/26/15.   Advance Directives  -RN CM encouraged patient to provide [name] and MDs a copy of her Advance Directive to place on file.    RN CM advised patient in next [facility] scheduled contact call with [place] RN CM within the next 10 business days.   RN CM advised to please notify MD of any changes in condition prior to scheduled appt's.   RN CM provided contact name and # [phone] or main office # [phone] and 24-hour nurse line # [phone].  RN CM confirmed patient is aware of 911 services for urgent emergency needs.  Nathaneil Canary, BSN, RN, [place] Care Management Care Management Coordinator [phone] Direct [phone] Cell [phone] Office [phone]  Fax Janssen [email or web address]

## 2015-10-26 ENCOUNTER — Ambulatory Visit (INDEPENDENT_AMBULATORY_CARE_PROVIDER_SITE_OTHER): Payer: Medicare Other | Admitting: Cardiology

## 2015-10-26 ENCOUNTER — Encounter: Payer: Self-pay | Admitting: Cardiology

## 2015-10-26 VITALS — BP 110/60 | HR 44 | Ht 65.0 in | Wt 167.8 lb

## 2015-10-26 DIAGNOSIS — I82402 Acute embolism and thrombosis of unspecified deep veins of left lower extremity: Secondary | ICD-10-CM

## 2015-10-26 DIAGNOSIS — D6489 Other specified anemias: Secondary | ICD-10-CM

## 2015-10-26 DIAGNOSIS — I2699 Other pulmonary embolism without acute cor pulmonale: Secondary | ICD-10-CM

## 2015-10-26 DIAGNOSIS — R0602 Shortness of breath: Secondary | ICD-10-CM

## 2015-10-26 DIAGNOSIS — I480 Paroxysmal atrial fibrillation: Secondary | ICD-10-CM | POA: Diagnosis not present

## 2015-10-26 DIAGNOSIS — J441 Chronic obstructive pulmonary disease with (acute) exacerbation: Secondary | ICD-10-CM | POA: Diagnosis not present

## 2015-10-26 DIAGNOSIS — Z7901 Long term (current) use of anticoagulants: Secondary | ICD-10-CM

## 2015-10-26 LAB — CBC
HCT: 41.4 % (ref 35.0–45.0)
HEMOGLOBIN: 13.8 g/dL (ref 11.7–15.5)
MCH: 29.9 pg (ref 27.0–33.0)
MCHC: 33.3 g/dL (ref 32.0–36.0)
MCV: 89.6 fL (ref 80.0–100.0)
MPV: 9.6 fL (ref 7.5–12.5)
PLATELETS: 329 10*3/uL (ref 140–400)
RBC: 4.62 MIL/uL (ref 3.80–5.10)
RDW: 13.2 % (ref 11.0–15.0)
WBC: 12.8 10*3/uL — ABNORMAL HIGH (ref 3.8–10.8)

## 2015-10-26 LAB — BASIC METABOLIC PANEL WITH GFR
BUN: 21 mg/dL (ref 7–25)
CHLORIDE: 99 mmol/L (ref 98–110)
CO2: 31 mmol/L (ref 20–31)
Calcium: 8.6 mg/dL (ref 8.6–10.4)
Creat: 0.94 mg/dL — ABNORMAL HIGH (ref 0.60–0.93)
GFR, Est African American: 68 mL/min (ref >=60)
GFR, Est Non African American: 59 mL/min — ABNORMAL LOW (ref >=60)
Glucose, Bld: 86 mg/dL (ref 65–99)
POTASSIUM: 3.9 mmol/L (ref 3.5–5.3)
SODIUM: 139 mmol/L (ref 135–146)

## 2015-10-26 NOTE — Telephone Encounter (Signed)
Spoke with pt and advised of Dr Agustina Caroli recommendations.  Pt is currently taking Pradaxa and is off the Amiodarone.  Pt will kee pf/u appt with Dr Lamonte Sakai on 11/25/15 and will call our office if symptoms change or worsen before then.

## 2015-10-26 NOTE — Telephone Encounter (Signed)
I had plan to see her as an outpatient in follow-up for pulmonary embolism and also some question of possible amiodarone toxicity with very subtle abnormalities on CT scan of the chest.  Our plan was as follows: - continue systemic anticoagulation for pulmonary embolism - Stop amiodarone - Follow-up in the office to assess her breathing with the above - Plan for pulmonary function testing based on how her breathing is doing when I see her back. She doesn't need to get these before we meet - Discussed the timing of any repeat imaging, either a chest x-ray or CT scan of the chest, when we meet Thanks/.

## 2015-10-26 NOTE — Patient Instructions (Signed)
Labwork:  Your physician recommends that you return for lab work: CBC, BMET   Medication Change:  Your physician has recommended you make the following change in your medication:  Take Bisoprolol 2.5mg  (1/2 tablet) daily.    Follow-Up  Your physician recommends that you follow up with Dr. Tamala Julian on 01/19/16 @ 8:30 am.  If you need a refill on your cardiac medications before your next appointment, please call your pharmacy.

## 2015-10-27 ENCOUNTER — Encounter: Payer: Self-pay | Admitting: *Deleted

## 2015-10-27 ENCOUNTER — Telehealth: Payer: Self-pay | Admitting: *Deleted

## 2015-10-27 ENCOUNTER — Other Ambulatory Visit: Payer: Self-pay | Admitting: *Deleted

## 2015-10-27 NOTE — Patient Outreach (Signed)
[facility]) Care Management  10/27/2015  Delise Hamidi Chavers 02-25-1941 [id number]   Referral received from telephonic care manager for outreach for transition of care program.  Recently discharged with diagnosis of PE.  According to chart, she also has history of Hypertension, heart failure, atrial fibrillation, COPD, and Hypercholesteremia.    Call placed to member, identity verified.  This care manager introduced self, states purpose of call.  [facility] care management services explained.  She denies the need for assistance at this time.  She reports that she lives with her husband and has a son locally, both of which are very supportive in her care.  Benefits of THN services again explained, however she declines to participate.  She has attended her follow up with her cardiologist and has an appointment with the pulmonologist scheduled.  She does accept contact information for this care manager and state she will call if her needs change.  Will notify care management assistant and PCP of case closure.  Valente David, [place], MSN [place] [phone]

## 2015-10-27 NOTE — Telephone Encounter (Signed)
Called pt per Cecilie Kicks, NP, and advised pt that she is to only take 2.5 mg Zebeta and a new rx has been sent to her pharmacy.  Pt verbalized appreciation and understanding.

## 2015-10-27 NOTE — Telephone Encounter (Signed)
-----   Message from Isaiah Serge, NP sent at 10/26/2015 11:30 PM EDT ----- Please let pt know to take 2.5 mg of Zebeta, we sent in for 2.5 mg tabs because she was having trouble cutting the 5 mg tabs in half.  But to continue 2.5 mg daily.  Thanks.

## 2015-10-28 ENCOUNTER — Other Ambulatory Visit: Payer: Self-pay

## 2015-10-28 MED ORDER — BISOPROLOL FUMARATE 5 MG PO TABS: 2.5000 mg | ORAL_TABLET | Freq: Every day | ORAL | 3 refills | Status: DC

## 2015-10-28 NOTE — Telephone Encounter (Signed)
Pt is asking for a refill of Bisoprolol RX. Pt stated that per her last OV with Cecilie Kicks, NP, she could have a 2.5 mg tablet rather then cutting the 5 mg tablet in half as it is very difficult for her. There is no 2.5 mg tablet for Bisoprolol FUMARATE only a 2.5 mg-6.5 mg for the Bisoprolol Hydrochlorothaizide. Pt does state she is NOT out of this medication currently but will be soon. I routed refill to Cecilie Kicks, NP to advise.

## 2015-10-28 NOTE — Telephone Encounter (Signed)
Returned pt phone call to update her on RX. Per Cecilie Kicks, Np, pt will need to continue on Bisoprolol Fumerate 5 mg and take 0.5 tablet PO daily. I informed pt that she has a new RX at her pharmacy, she states she is now out of pills now and doesn't need them refilled today. I told her that was fine and that the RX would be at the pharmacy whenever she was ready for it. She was agreeable to plan and thanked me.

## 2015-10-28 NOTE — Telephone Encounter (Signed)
Then she will need to continue breaking the 5 mg tab in half - she only needs 2.5 mg daily.  Thanks.

## 2015-11-10 ENCOUNTER — Other Ambulatory Visit: Payer: Self-pay | Admitting: *Deleted

## 2015-11-10 DIAGNOSIS — F3341 Major depressive disorder, recurrent, in partial remission: Secondary | ICD-10-CM | POA: Diagnosis not present

## 2015-11-10 NOTE — Patient Outreach (Signed)
[facility]) Care Management  11/10/2015  Zayiah Zajac Hemme 1940/12/12 [id number]   Call received from patient, stating that she was in need of help/adivce.  She was offered [facility] care management services within the last couple weeks, after her discharge from hospital.  She declined services at that time, but is calling today due to a weight gain.  Identity verified.    She reports that she has gained 6 pounds over the past 2 days.  She state she has been taking her medications as prescribed, and checking daily weights.  Along with her weight gain, she state that her "legs feel like they don't want to go."  She is unable to identify any swelling, but state that "they just don't feel right."  She has not seen her cardiologist since 8/29 for her hospital follow up.  Advised to contact her cardiologist immediately to report concerns/symptoms and to obtain further instructions.  Also instructed to inquire about a follow up office visit as soon as possible.  She verbalizes understanding, will follow up within the next 2 days.  Will re-address concerns, will open case at that time if needed.  Valente David, [place], MSN [place] [phone]

## 2015-11-11 ENCOUNTER — Other Ambulatory Visit: Payer: Self-pay | Admitting: Interventional Cardiology

## 2015-11-12 DIAGNOSIS — Z23 Encounter for immunization: Secondary | ICD-10-CM | POA: Diagnosis not present

## 2015-11-15 DIAGNOSIS — I952 Hypotension due to drugs: Secondary | ICD-10-CM | POA: Diagnosis not present

## 2015-11-15 DIAGNOSIS — R0689 Other abnormalities of breathing: Secondary | ICD-10-CM | POA: Diagnosis not present

## 2015-11-15 DIAGNOSIS — I2699 Other pulmonary embolism without acute cor pulmonale: Secondary | ICD-10-CM | POA: Diagnosis not present

## 2015-11-15 DIAGNOSIS — R0600 Dyspnea, unspecified: Secondary | ICD-10-CM | POA: Diagnosis not present

## 2015-11-25 ENCOUNTER — Ambulatory Visit (INDEPENDENT_AMBULATORY_CARE_PROVIDER_SITE_OTHER): Payer: Medicare Other | Admitting: Emergency Medicine

## 2015-11-25 ENCOUNTER — Encounter: Payer: Self-pay | Admitting: Emergency Medicine

## 2015-11-25 DIAGNOSIS — R0902 Hypoxemia: Secondary | ICD-10-CM

## 2015-11-25 DIAGNOSIS — I2699 Other pulmonary embolism without acute cor pulmonale: Secondary | ICD-10-CM | POA: Diagnosis not present

## 2015-11-25 NOTE — Assessment & Plan Note (Signed)
Improving with continued dyspnea Compliant with Pradaxa No Obvious Source of Bleeding Plan: We will schedule you for Pulmonary Function Testing We will schedule you for a HRCT no contrast in mid October  Continue your Pradaxa as you have been doing Bleeding precautions while on blood thinners. Continue wearing your oxygen at 2 L [place]. Oxygen saturation goals are > 92% Follow up with Dr. Lamonte Sakai after PFT's and Repeat HRCT Please contact office for sooner follow up if symptoms do not improve or worsen or seek emergency care

## 2015-11-25 NOTE — Patient Instructions (Addendum)
It is nice to meet you today. We will schedule you for Pulmonary Function Testing We will schedule you for a HRCT no contrast in mid October  Continue your Pradaxa as you have been doing Bleeding precautions while on blood thinners. Continue wearing your oxygen at 2 L [place]. Oxygen saturation goals are > 92% Follow up with Dr. Lamonte Sakai after PFT's and Repeat HRCT Please contact office for sooner follow up if symptoms do not improve or worsen or seek emergency care

## 2015-11-25 NOTE — Assessment & Plan Note (Signed)
Hypoxemia while recovering from PE Plan Continue wearing oxygen at 2 L [place] as you have been doing. Saturation goals are >92%

## 2015-11-25 NOTE — Progress Notes (Signed)
History of Present Illness Judy White is a 75 y.o. female current smoker with recent PE ,COPD, HTN, paroxysmal-A.Fib, patient not on blood thinners despite CHADS vasc of 4 due to history of brain bleed. She has a Watchman device in her left atrial appendage.  She is being seen by Dr. Lamonte Sakai  HPI:75 yo woman with hx A fib and watchman placement, on amiodarone but not anti-coag since 01/2015 due to concern regarding hx of SDH in 2007. Also w hx HTN, tobacco use and COPD. She was admitted with light-headedness, near-syncope 8/11, found to be hypoxemic. CT chest 8/11 showed scattered RLL PE's, some evidence for interstitial edema and GGI. LE doppler study showed RLE DVT. She was been started on therapeutic enoxaparin.and transitioned to Pradaxa at discharge. She was discharged home on oxygen.   11/25/2015 Hospital Follow Up: Pt. Presents to the office after hospitalization for PE. She has been compliant with her Pradaxa 150 mg twice daily.. She continues to improve. She is no longer taking amiodarone. Her cough is better and non-productive.She has quit smoking, which I congratulated her on.She is frustrated that she is still oxygen dependent.Saturations dropped to 87% walking into the office today on her 2L [place].Marland Kitchen She rebounded quickly to 94%. We discussed that she will need to wear her oxygen until she no longer has desaturations.She has adequate follow up with cardiology. Last HGB was 10/26/2015 >> 13.8. Last Creatinine 8/29 was 0.94.She denies chest pain, fever, orthopnea or hemoptysis.   Admit date: 10/07/2015 Discharge date: 10/12/2015  Tests CT-PA 8/11 >> several RLL pulmonary emboli, evidence for pulmonary vascular congestion with some basilar more confluent GG change LE doppler US 8/12 >> RLE DVT ECHO  10/09/2015>> PAP is 28 mm Hg  Past medical hx Past Medical History:  Diagnosis Date  . Agoraphobia   . Anxiety   . Arthritis    "normal for the age; nothing gives me problems" (10/08/2015)  .  COPD (chronic obstructive pulmonary disease) ([place])    "mild" (10/08/2015)  . Depressive disorder, not elsewhere classified   . Hypercholesteremia   . Hypertension   . Myalgia and myositis, unspecified   . Osteopenia   . Overactive bladder   . Paroxysmal atrial fibrillation (HCC)    a. s/p Watchman implant 12/2014  . Pneumonia X 1     Past surgical hx, Family hx, Social hx all reviewed.  Current Outpatient Prescriptions on File Prior to Visit  Medication Sig  . Ascorbic Acid (VITAMIN C PO) Take 500 mg by mouth daily.   Marland Kitchen aspirin 81 MG tablet Take 81 mg by mouth daily.  . bisoprolol (ZEBETA) 5 MG tablet Take 0.5 tablets (2.5 mg total) by mouth daily.  . Calcium-Vitamin D (CALTRATE 600 PLUS-VIT D PO) Take 1 tablet by mouth daily.   . cholecalciferol (VITAMIN D) 1000 UNITS tablet Take 1,000 Units by mouth daily.  . clonazePAM (KLONOPIN) 1 MG tablet Take 1.5-2 tablets by mouth daily.   . Cyanocobalamin (VITAMIN B-12 PO) Take 1 tablet by mouth daily.   . dabigatran (PRADAXA) 150 MG CAPS capsule Take 1 capsule (150 mg total) by mouth 2 (two) times daily.  Marland Kitchen FLUoxetine (PROZAC) 20 MG capsule Take 20 mg by mouth daily.   . furosemide (LASIX) 20 MG tablet Take 1 tablet (20 mg total) by mouth daily.  . Omega-3 Fatty Acids (FISH OIL PO) Take 1 tablet by mouth daily.   Marland Kitchen oxybutynin (DITROPAN-XL) 10 MG 24 hr tablet Take 1 tablet by mouth daily.  Marland Kitchen  Potassium 75 MG TABS Take 1 tablet by mouth daily.   No current facility-administered medications on file prior to visit.      No Known Allergies  Review Of Systems:  Constitutional:   No  weight loss, night sweats,  Fevers, chills, fatigue, or  lassitude.  HEENT:   No headaches,  Difficulty swallowing,  Tooth/dental problems, or  Sore throat,                No sneezing, itching, ear ache, nasal congestion, post nasal drip,   CV:  No chest pain,  Orthopnea, PND, + swelling in lower extremities, anasarca, dizziness, palpitations, syncope.   GI   No heartburn, indigestion, abdominal pain, nausea, vomiting, diarrhea, change in bowel habits, loss of appetite, bloody stools.   Resp: + shortness of breath with exertion less at rest.  No excess mucus, no productive cough,  + non-productive cough,  No coughing up of blood.  No change in color of mucus.  No wheezing.  No chest wall deformity  Skin: no rash or lesions.  GU: no dysuria, change in color of urine, no urgency or frequency.  No flank pain, no hematuria   MS:  No joint pain or swelling.  No decreased range of motion.  + back pain.  Psych:  No change in mood or affect. No depression or anxiety.  No memory loss.   Vital Signs BP 104/64 (BP Location: Right Arm, Cuff Size: Normal)   Pulse (!) 48   Ht 5' 5.5" (1.664 m)   Wt 169 lb 6.4 oz (76.8 kg)   SpO2 94%   BMI 27.76 kg/m    Physical Exam:  General- No distress,  A&Ox3, pleasant ENT: No sinus tenderness, TM clear, pale nasal mucosa, no oral exudate,no post nasal drip, no LAN Cardiac: S1, S2, regular rate and rhythm, no murmur Chest: No wheeze/ + crackles / dullness; no accessory muscle use, no nasal flaring, no sternal retractions Abd.: Soft Non-tender Ext: No clubbing cyanosis, edema Neuro:  normal strength Skin: No rashes, warm and dry Psych: normal mood and behavior   Assessment/Plan  PE (pulmonary embolism) Improving with continued dyspnea Compliant with Pradaxa No Obvious Source of Bleeding Plan: We will schedule you for Pulmonary Function Testing We will schedule you for a HRCT no contrast in mid October  Continue your Pradaxa as you have been doing Bleeding precautions while on blood thinners. Continue wearing your oxygen at 2 L [place]. Oxygen saturation goals are > 92% Follow up with Dr. Lamonte Sakai after PFT's and Repeat HRCT Please contact office for sooner follow up if symptoms do not improve or worsen or seek emergency care    Hypoxemia Hypoxemia while recovering from PE Plan Continue wearing oxygen  at 2 L [place] as you have been doing. Saturation goals are >92%    [name], NP 11/25/2015  5:03 PM   Attending Note:  I have examined patient, reviewed labs, studies and notes. I have discussed the case with Gladstone Pih, and I agree with the data and plans as amended above. She was admitted with PE, A fib with some volume overload. Was started on pradaxa, had an abnormal CT chest with bilateral interstitial infiltrates, GG changes suggestive of pulm edema or possible pneumonitis. she has stopped amiodarone and also stopped smoking (either of which could have been contributing). I would like to repeat her CT chest to compare w the hospital, also get PFT given her tobacco hx. Hopefully we will be able to  wean her O2 as we go forward on anticoagulation.   Baltazar Apo, MD, PhD 11/25/2015, 5:08 PM [facility] [phone] or if no answer [phone]

## 2015-12-02 ENCOUNTER — Encounter (HOSPITAL_COMMUNITY): Payer: Self-pay | Admitting: Emergency Medicine

## 2015-12-02 ENCOUNTER — Emergency Department (HOSPITAL_COMMUNITY): Payer: Medicare Other

## 2015-12-02 ENCOUNTER — Telehealth: Payer: Self-pay | Admitting: Emergency Medicine

## 2015-12-02 ENCOUNTER — Inpatient Hospital Stay (HOSPITAL_COMMUNITY)
Admission: EM | Admit: 2015-12-02 | Discharge: 2015-12-03 | DRG: 309 | Disposition: A | Discharge status: Home / Self Care | Payer: Medicare Other | Attending: Cardiovascular Disease | Admitting: Cardiovascular Disease

## 2015-12-02 ENCOUNTER — Telehealth: Payer: Self-pay | Admitting: Interventional Cardiology

## 2015-12-02 DIAGNOSIS — Z95818 Presence of other cardiac implants and grafts: Secondary | ICD-10-CM

## 2015-12-02 DIAGNOSIS — Z9842 Cataract extraction status, left eye: Secondary | ICD-10-CM

## 2015-12-02 DIAGNOSIS — I4892 Unspecified atrial flutter: Secondary | ICD-10-CM | POA: Diagnosis present

## 2015-12-02 DIAGNOSIS — Z9841 Cataract extraction status, right eye: Secondary | ICD-10-CM

## 2015-12-02 DIAGNOSIS — F4000 Agoraphobia, unspecified: Secondary | ICD-10-CM | POA: Diagnosis present

## 2015-12-02 DIAGNOSIS — Z7902 Long term (current) use of antithrombotics/antiplatelets: Secondary | ICD-10-CM

## 2015-12-02 DIAGNOSIS — Z961 Presence of intraocular lens: Secondary | ICD-10-CM | POA: Diagnosis present

## 2015-12-02 DIAGNOSIS — Z981 Arthrodesis status: Secondary | ICD-10-CM

## 2015-12-02 DIAGNOSIS — I480 Paroxysmal atrial fibrillation: Secondary | ICD-10-CM | POA: Diagnosis not present

## 2015-12-02 DIAGNOSIS — Z23 Encounter for immunization: Secondary | ICD-10-CM

## 2015-12-02 DIAGNOSIS — R0602 Shortness of breath: Secondary | ICD-10-CM | POA: Diagnosis not present

## 2015-12-02 DIAGNOSIS — R002 Palpitations: Secondary | ICD-10-CM | POA: Diagnosis not present

## 2015-12-02 DIAGNOSIS — F419 Anxiety disorder, unspecified: Secondary | ICD-10-CM | POA: Diagnosis present

## 2015-12-02 DIAGNOSIS — Z9981 Dependence on supplemental oxygen: Secondary | ICD-10-CM

## 2015-12-02 DIAGNOSIS — E7800 Pure hypercholesterolemia, unspecified: Secondary | ICD-10-CM | POA: Diagnosis present

## 2015-12-02 DIAGNOSIS — I5032 Chronic diastolic (congestive) heart failure: Secondary | ICD-10-CM | POA: Diagnosis present

## 2015-12-02 DIAGNOSIS — N3281 Overactive bladder: Secondary | ICD-10-CM | POA: Diagnosis present

## 2015-12-02 DIAGNOSIS — Z86711 Personal history of pulmonary embolism: Secondary | ICD-10-CM | POA: Diagnosis not present

## 2015-12-02 DIAGNOSIS — Z7982 Long term (current) use of aspirin: Secondary | ICD-10-CM

## 2015-12-02 DIAGNOSIS — I110 Hypertensive heart disease with heart failure: Secondary | ICD-10-CM | POA: Diagnosis present

## 2015-12-02 DIAGNOSIS — J449 Chronic obstructive pulmonary disease, unspecified: Secondary | ICD-10-CM | POA: Diagnosis not present

## 2015-12-02 DIAGNOSIS — Z8249 Family history of ischemic heart disease and other diseases of the circulatory system: Secondary | ICD-10-CM

## 2015-12-02 DIAGNOSIS — R000 Tachycardia, unspecified: Secondary | ICD-10-CM | POA: Diagnosis not present

## 2015-12-02 DIAGNOSIS — Z79899 Other long term (current) drug therapy: Secondary | ICD-10-CM | POA: Diagnosis not present

## 2015-12-02 DIAGNOSIS — I4891 Unspecified atrial fibrillation: Secondary | ICD-10-CM | POA: Diagnosis present

## 2015-12-02 DIAGNOSIS — F329 Major depressive disorder, single episode, unspecified: Secondary | ICD-10-CM | POA: Diagnosis present

## 2015-12-02 DIAGNOSIS — I483 Typical atrial flutter: Secondary | ICD-10-CM | POA: Diagnosis not present

## 2015-12-02 DIAGNOSIS — Z87891 Personal history of nicotine dependence: Secondary | ICD-10-CM

## 2015-12-02 LAB — COMPREHENSIVE METABOLIC PANEL
ALK PHOS: 62 U/L (ref 38–126)
ALT: 13 U/L — AB (ref 14–54)
AST: 17 U/L (ref 15–41)
Albumin: 3.3 g/dL — ABNORMAL LOW (ref 3.5–5.0)
Anion gap: 9 (ref 5–15)
BUN: 22 mg/dL — AB (ref 6–20)
CALCIUM: 9.3 mg/dL (ref 8.9–10.3)
CHLORIDE: 102 mmol/L (ref 101–111)
CO2: 26 mmol/L (ref 22–32)
CREATININE: 1.39 mg/dL — AB (ref 0.44–1.00)
GFR calc Af Amer: 41 mL/min — ABNORMAL LOW (ref >60)
GFR, EST NON AFRICAN AMERICAN: 36 mL/min — AB (ref >60)
Glucose, Bld: 140 mg/dL — ABNORMAL HIGH (ref 65–99)
Potassium: 4 mmol/L (ref 3.5–5.1)
Sodium: 137 mmol/L (ref 135–145)
Total Bilirubin: 0.5 mg/dL (ref 0.3–1.2)
Total Protein: 7.4 g/dL (ref 6.5–8.1)

## 2015-12-02 LAB — CBC WITH DIFFERENTIAL/PLATELET
BASOS PCT: 0 %
Basophils Absolute: 0.1 10*3/uL (ref 0.0–0.1)
EOS ABS: 0.3 10*3/uL (ref 0.0–0.7)
EOS PCT: 2 %
HCT: 44.3 % (ref 36.0–46.0)
HEMOGLOBIN: 14.1 g/dL (ref 12.0–15.0)
Lymphocytes Relative: 12 %
Lymphs Abs: 1.7 10*3/uL (ref 0.7–4.0)
MCH: 29.1 pg (ref 26.0–34.0)
MCHC: 31.8 g/dL (ref 30.0–36.0)
MCV: 91.3 fL (ref 78.0–100.0)
MONOS PCT: 7 %
Monocytes Absolute: 0.9 10*3/uL (ref 0.1–1.0)
NEUTROS PCT: 79 %
Neutro Abs: 11.2 10*3/uL — ABNORMAL HIGH (ref 1.7–7.7)
PLATELETS: 320 10*3/uL (ref 150–400)
RBC: 4.85 MIL/uL (ref 3.87–5.11)
RDW: 13.7 % (ref 11.5–15.5)
WBC: 14.1 10*3/uL — AB (ref 4.0–10.5)

## 2015-12-02 LAB — PROTIME-INR
INR: 1.21
Prothrombin Time: 15.4 seconds — ABNORMAL HIGH (ref 11.4–15.2)

## 2015-12-02 LAB — I-STAT TROPONIN, ED: TROPONIN I, POC: 0 ng/mL (ref 0.00–0.08)

## 2015-12-02 LAB — BRAIN NATRIURETIC PEPTIDE: B Natriuretic Peptide: 375.6 pg/mL — ABNORMAL HIGH (ref 0.0–100.0)

## 2015-12-02 MED ORDER — FLUOXETINE HCL 20 MG PO CAPS
20.0000 mg | ORAL_CAPSULE | Freq: Every day | ORAL | Status: DC
  Administered 2015-12-03: 20 mg via ORAL
  Filled 2015-12-02: qty 1

## 2015-12-02 MED ORDER — BISOPROLOL FUMARATE 5 MG PO TABS
2.5000 mg | ORAL_TABLET | Freq: Every day | ORAL | Status: DC
  Filled 2015-12-02: qty 1

## 2015-12-02 MED ORDER — DABIGATRAN ETEXILATE MESYLATE 150 MG PO CAPS
150.0000 mg | ORAL_CAPSULE | Freq: Two times a day (BID) | ORAL | Status: DC
  Administered 2015-12-02 – 2015-12-03 (×2): 150 mg via ORAL
  Filled 2015-12-02 (×2): qty 1

## 2015-12-02 MED ORDER — OXYBUTYNIN CHLORIDE ER 10 MG PO TB24
10.0000 mg | ORAL_TABLET | Freq: Every day | ORAL | Status: DC
  Filled 2015-12-02: qty 1

## 2015-12-02 MED ORDER — ACETAMINOPHEN 325 MG PO TABS
650.0000 mg | ORAL_TABLET | ORAL | Status: DC | PRN
Start: 2015-12-02 — End: 2015-12-03

## 2015-12-02 MED ORDER — DILTIAZEM HCL 100 MG IV SOLR
5.0000 mg/h | Freq: Once | INTRAVENOUS | Status: AC
  Administered 2015-12-02: 5 mg/h via INTRAVENOUS

## 2015-12-02 MED ORDER — ORAL CARE MOUTH RINSE: 15.0000 mL | Freq: Two times a day (BID) | OROMUCOSAL | Status: DC

## 2015-12-02 MED ORDER — CLONAZEPAM 0.5 MG PO TABS
1.5000 mg | ORAL_TABLET | Freq: Every day | ORAL | Status: DC
  Administered 2015-12-03: 1.5 mg via ORAL
  Filled 2015-12-02: qty 3

## 2015-12-02 MED ORDER — SODIUM CHLORIDE 0.9 % IV BOLUS (SEPSIS)
250.0000 mL | Freq: Once | INTRAVENOUS | Status: AC
  Administered 2015-12-02: 250 mL via INTRAVENOUS

## 2015-12-02 MED ORDER — FUROSEMIDE 20 MG PO TABS
20.0000 mg | ORAL_TABLET | Freq: Every day | ORAL | Status: DC
  Administered 2015-12-03: 20 mg via ORAL
  Filled 2015-12-02: qty 1

## 2015-12-02 MED ORDER — ASPIRIN 81 MG PO CHEW
81.0000 mg | CHEWABLE_TABLET | Freq: Every day | ORAL | Status: DC
  Administered 2015-12-03: 81 mg via ORAL
  Filled 2015-12-02: qty 1

## 2015-12-02 MED ORDER — ONDANSETRON HCL 4 MG/2ML IJ SOLN: 4.0000 mg | Freq: Four times a day (QID) | INTRAMUSCULAR | Status: DC | PRN

## 2015-12-02 MED ORDER — VITAMIN C 500 MG PO TABS
500.0000 mg | ORAL_TABLET | Freq: Every day | ORAL | Status: DC
  Administered 2015-12-03: 500 mg via ORAL
  Filled 2015-12-02: qty 1

## 2015-12-02 MED ORDER — POTASSIUM 75 MG PO TABS: 1.0000 | ORAL_TABLET | Freq: Every day | ORAL | Status: DC

## 2015-12-02 MED ORDER — DILTIAZEM HCL 25 MG/5ML IV SOLN
5.0000 mg | Freq: Once | INTRAVENOUS | Status: AC
  Administered 2015-12-02: 5 mg via INTRAVENOUS
  Filled 2015-12-02: qty 5

## 2015-12-02 MED ORDER — DILTIAZEM HCL-DEXTROSE 100-5 MG/100ML-% IV SOLN (PREMIX): 5.0000 mg/h | INTRAVENOUS | Status: DC

## 2015-12-02 MED ORDER — SODIUM CHLORIDE 0.9 % IV SOLN: INTRAVENOUS | Status: DC

## 2015-12-02 MED ORDER — DILTIAZEM HCL 25 MG/5ML IV SOLN
5.0000 mg | Freq: Once | INTRAVENOUS | Status: AC
  Administered 2015-12-02: 5 mg via INTRAVENOUS

## 2015-12-02 MED ORDER — OMEGA-3-ACID ETHYL ESTERS 1 G PO CAPS
1.0000 g | ORAL_CAPSULE | Freq: Every day | ORAL | Status: DC
  Administered 2015-12-03: 1 g via ORAL
  Filled 2015-12-02: qty 1

## 2015-12-02 MED ORDER — VITAMIN D 1000 UNITS PO TABS
1000.0000 [IU] | ORAL_TABLET | Freq: Every day | ORAL | Status: DC
  Administered 2015-12-03: 1000 [IU] via ORAL
  Filled 2015-12-02: qty 1

## 2015-12-02 MED ORDER — PNEUMOCOCCAL VAC POLYVALENT 25 MCG/0.5ML IJ INJ
0.5000 mL | INJECTION | INTRAMUSCULAR | Status: AC
  Administered 2015-12-03: 0.5 mL via INTRAMUSCULAR
  Filled 2015-12-02: qty 0.5

## 2015-12-02 NOTE — Telephone Encounter (Signed)
Pt aware of RB recommendations. Pt states urgent care will not see her for this matter. I have advised pt to contact her cardiologist and if unable to get an appointment with them to go to ED to be checked out if urgent care will not see her. Pt voiced understanding. Nothing further needed.

## 2015-12-02 NOTE — H&P (Signed)
History & Physical    Patient ID: Judy White MRN: [id number], DOB/AGE: 77-Jul-1942   Admit date: 12/02/2015   Primary Physician: Judy Screws, MD Primary Cardiologist: Judy White    Patient Profile    75 y o woman with Afib  Past Medical History    Past Medical History:  Diagnosis Date  . Agoraphobia   . Anxiety   . Arthritis    "normal for the age; nothing gives me problems" (10/08/2015)  . COPD (chronic obstructive pulmonary disease) ([place])    "mild" (10/08/2015)  . Depressive disorder, not elsewhere classified   . Hypercholesteremia   . Hypertension   . Myalgia and myositis, unspecified   . Osteopenia   . Overactive bladder   . Paroxysmal atrial fibrillation (HCC)    a. s/p Watchman implant 12/2014  . Pneumonia X 1    Past Surgical History:  Procedure Laterality Date  . APPENDECTOMY  1983  . BACK SURGERY    . CARDIAC CATHETERIZATION     >5 yrs  . CATARACT EXTRACTION W/ INTRAOCULAR LENS  IMPLANT, BILATERAL Bilateral   . CESAREAN SECTION  1967  . LEFT ATRIAL APPENDAGE OCCLUSION N/A 12/31/2014   Procedure: LEFT ATRIAL APPENDAGE OCCLUSION;  Surgeon: Judy Grayer, MD;  Location: [facility];  Service: Cardiovascular;  Laterality: N/A;  . POSTERIOR FUSION LUMBAR SPINE Bilateral 07/2014  . SHOULDER ARTHROSCOPY W/ ROTATOR CUFF REPAIR Left   . SHOULDER SURGERY Right    "tendon broke; couldn't be repaired"  . TEE WITHOUT CARDIOVERSION N/A 12/22/2014   Procedure: TRANSESOPHAGEAL ECHOCARDIOGRAM (TEE);  Surgeon: Judy Casino, MD;  Location: [facility] ENDOS[facility];  Service: Cardiovascular;  Laterality: N/A;  . TEE WITHOUT CARDIOVERSION N/A 02/16/2015   post Watchman TEE with good seal and no leak around device   . TONSILLECTOMY    . TOTAL ABDOMINAL HYSTERECTOMY    . WISDOM TOOTH EXTRACTION       Allergies  No Known Allergies  History of Present Illness    Judy White a 75 y.o.femalewith medical history significant of COPD, HTN, paroxysmal-A.Fib, a recent PE  in August, history of brain bleed.Instead patient had a new device (Watchman's) placed in November of last year in her left atrial appendage.  She has been on both rhythm control and rate control meds. Amiodarone for several years to try and rhythm control her A.Fib/flutter; however, this hasnt really been working all that well (which is why they put in the [facility] device). Since new diagnosis of PE she is on home O2. 2 L. Now she presents form home with palpitations and HR in 130's. No CP, no SOB, no PND, no pre-syncope. In the ED found to have HR in 110's *(fib -flutter). Started on diltiazem. Her case was discussed with provider who reportedly recommended admission.   Patient denies to have missed medications. No new meds. NO bleeding.   Home Medications    Prior to Admission medications   Medication Sig Start Date End Date Taking? Authorizing Provider  Ascorbic Acid (VITAMIN C PO) Take 500 mg by mouth daily.    Yes Historical Provider, MD  aspirin 81 MG tablet Take 81 mg by mouth daily.   Yes Historical Provider, MD  bisoprolol (ZEBETA) 5 MG tablet Take 0.5 tablets (2.5 mg total) by mouth daily. 10/28/15  Yes Judy Serge, NP  Calcium-Vitamin D (CALTRATE 600 PLUS-VIT D PO) Take 1 tablet by mouth daily.    Yes Historical Provider, MD  cholecalciferol (VITAMIN D) 1000 UNITS tablet  Take 1,000 Units by mouth daily.   Yes Historical Provider, MD  clonazePAM (KLONOPIN) 1 MG tablet Take 1.5-2 tablets by mouth daily.  03/30/13  Yes Historical Provider, MD  Cyanocobalamin (VITAMIN B-12 PO) Take 1 tablet by mouth daily.    Yes Historical Provider, MD  dabigatran (PRADAXA) 150 MG CAPS capsule Take 1 capsule (150 mg total) by mouth 2 (two) times daily. 11/11/15  Yes Judy Crome, MD  FLUoxetine (PROZAC) 20 MG capsule Take 20 mg by mouth daily.  03/16/13  Yes Historical Provider, MD  furosemide (LASIX) 20 MG tablet Take 1 tablet (20 mg total) by mouth daily. 11/27/14  Yes Judy Crome, MD  Omega-3  Fatty Acids (FISH OIL PO) Take 1 tablet by mouth daily.    Yes Historical Provider, MD  oxybutynin (DITROPAN-XL) 10 MG 24 hr tablet Take 1 tablet by mouth daily. 03/22/13  Yes Historical Provider, MD  Potassium 75 MG TABS Take 1 tablet by mouth daily.   Yes Historical Provider, MD    Family History    Family History  Problem Relation Age of Onset  . Aneurysm Father   . Hypotension Mother   . Neuropathy Brother   . Heart disease Brother   . Cancer      maternal side  . Heart Problems      paternal side  . Heart attack Neg Hx     Social History    Social History   Social History  . Marital status: Married    Spouse name: N/A  . Number of children: N/A  . Years of education: N/A   Occupational History  . Not on file.   Social History Main Topics  . Smoking status: Former Smoker    Packs/day: 0.50    Years: 60.00    Types: Cigarettes    Quit date: 10/08/2015  . Smokeless tobacco: Never Used  . Alcohol use 0.0 oz/week     Comment: 10/08/2015 "don't drink at all right now; social drinker"  . Drug use: No  . Sexual activity: No   Other Topics Concern  . Not on file   Social History Narrative  . No narrative on file     Review of Systems    General:  No chills, fever, night sweats or weight changes.  Cardiovascular:  No chest pain, dyspnea on exertion, edema, orthopnea, palpitations, paroxysmal nocturnal dyspnea. Dermatological: No rash, lesions/masses Respiratory: No cough, dyspnea Urologic: No hematuria, dysuria Abdominal:   No nausea, vomiting, diarrhea, bright red blood per rectum, melena, or hematemesis Neurologic:  No visual changes, wkns, changes in mental status. All other systems reviewed and are otherwise negative except as noted above.  Physical Exam    Blood pressure (!) 86/57, pulse (!) 122, temperature 98.2 F (36.8 C), resp. rate 22, height 5\' 6"  (1.676 m), weight 74.8 kg (165 lb), SpO2 95 %.  General: Pleasant, NAD Psych: Normal affect. Neuro:  Alert and oriented X 3. Moves all extremities spontaneously. HEENT: Normal  Neck: Supple without bruits or JVD. Lungs:  Resp regular and unlabored, CTA. Heart: RRR no s3, s4, or murmurs. Abdomen: Soft, non-tender, non-distended, BS + x 4.  Extremities: No clubbing, cyanosis or edema. DP/PT/Radials 2+ and equal bilaterally.  Labs    Troponin [facility] of Care Test)  Recent Labs  12/02/15 1623  TROPIPOC 0.00   No results for input(s): CKTOTAL, CKMB, TROPONINI in the last 72 hours. Lab Results  Component Value Date   WBC 14.1 (H) 12/02/2015  HGB 14.1 12/02/2015   HCT 44.3 12/02/2015   MCV 91.3 12/02/2015   PLT 320 12/02/2015    Recent Labs Lab 12/02/15 1611  NA 137  K 4.0  CL 102  CO2 26  BUN 22*  CREATININE 1.39*  CALCIUM 9.3  PROT 7.4  BILITOT 0.5  ALKPHOS 62  ALT 13*  AST 17  GLUCOSE 140*   No results found for: CHOL, HDL, LDLCALC, TRIG Lab Results  Component Value Date   DDIMER 2.67 (H) 10/08/2015     Radiology Studies    Dg Chest Portable 1 View  Result Date: 12/02/2015 CLINICAL DATA:  Atrial fibrillation, shortness of breath for 2 days, history COPD, hypertension EXAM: PORTABLE CHEST 1 VIEW COMPARISON:  Portable exam 1624 hours compared 10/07/2015 FINDINGS: Enlargement of cardiac silhouette. Mediastinal contours normal. Emphysematous changes with increased interstitial infiltrates since previous exam likely representing increased pulmonary edema. No definite pleural effusion or pneumothorax. Marked osseous demineralization. IMPRESSION: Enlargement of cardiac silhouette with diffuse increase in interstitial infiltrates since previous exam favoring pulmonary edema though infection not excluded. Underlying COPD changes. Electronically Signed   By: Lavonia Dana M.D.   On: 12/02/2015 16:36    ECG & Cardiac Imaging   2:1 flutter  Assessment & Plan    SADEEL WEISSBERG a 75 y.o.femalewith medical history significant of COPD, HTN, paroxysmal-A.Fib, a recent PE in  August, history of brain bleed.Instead patient had a new device (Watchman's) placed in November of last year in her left atrial appendage. She presents with Afib and RVR with minimal symptoms. Her CHADSvASC is 4. She I son Pradaxa and ASA. Her rate is currently uncontrolled. She appears to have failed Amio in the past.   Plan: - DIlt drip - Home BB. Will likely require higher doses as we transition dilt off. - cont pradaxa, however she might not have a need for ASA. Likely only increases bleeding risk - Meets indications for statin. Not currently on.   Signed, Cristina Gong, MD 12/02/2015, 9:16 PM

## 2015-12-02 NOTE — ED Notes (Signed)
Patient denies pain and is resting comfortably.  

## 2015-12-02 NOTE — Telephone Encounter (Signed)
Calling stating she has been in Afib for about 1 1/2 days.  States her HR has ranged from 113-130. Today BP at 1:08 was 81/63 HR 122; at 1:11 108/75 HR 121.  She is taking HR with her BP cuff and her O2 sat on finger. Denies CP. States she is SOB when she talks and has a headache.  She just took some tylenol and HA is better.  Is taking Pradaxa and is doing well with that since she has had a brain bleed in past and wasn't able to take blood thinners.  Had Watchman placed in Nov last year. Spoke w/ Dr. Tamala Julian who advises for her to go to ER since BP low and has been in Afib and not converted in 2 days.  Advised pt and she will go to ER with husband driving. Notified Trish and spoke w/Eric (triage nurse) in ER.

## 2015-12-02 NOTE — Telephone Encounter (Signed)
New Message:    Pt is in atrial fib now,little SOB and she have a headache.

## 2015-12-02 NOTE — ED Notes (Signed)
Called main lab to add on

## 2015-12-02 NOTE — ED Triage Notes (Signed)
Pt reports hx of afib with two days of rapid heart rate.Pt reports hx of same but it usually goes away. Pt denies pain but admits shortness of breath with talking. Labs drawn in triage.

## 2015-12-02 NOTE — ED Provider Notes (Signed)
[facility]-EMERGENCY DEPT Provider Note   CSN: [id number] Arrival date & time: 12/02/15  1600     History   Chief Complaint Chief Complaint  Patient presents with  . Atrial Fibrillation    HPI Judy White is a 75 y.o. female.  Patient followed by Verdis Prime from cardiology. Patient with 2 day history of palpitations consistent with her past history of atrial fibrillation possibly atrial flutter. Patient started on Pradaxa in mid-August due to a pulmonary embolus. Patient normally on 2 L of oxygen no increased shortness of breath no chest pain. Patient was getting low blood pressure readings at home and was advised appropriately by cardiology to come in for evaluation. Patient status heart rate was in the 140s at home initial EKG here at heart rates in the 120s.      Past Medical History:  Diagnosis Date  . Agoraphobia   . Anxiety   . Arthritis    "normal for the age; nothing gives me problems" (10/08/2015)  . COPD (chronic obstructive pulmonary disease) (HCC)    "mild" (10/08/2015)  . Depressive disorder, not elsewhere classified   . Hypercholesteremia   . Hypertension   . Myalgia and myositis, unspecified   . Osteopenia   . Overactive bladder   . Paroxysmal atrial fibrillation (HCC)    a. s/p Watchman implant 12/2014  . Pneumonia X 1    Patient Active Problem List   Diagnosis Date Noted  . Atrial fibrillation (HCC) 12/02/2015  . Hypoxemia 11/25/2015  . Abnormal CT of the chest   . PE (pulmonary embolism)   . Pulmonary edema 10/08/2015  . Interstitial lung disease (HCC) 10/08/2015  . Acute on chronic diastolic (congestive) heart failure 10/08/2015  . Paroxysmal atrial fibrillation (HCC)   . Typical atrial flutter (HCC)   . Chronic diastolic heart failure (HCC) 08/25/2014  . Lumbar stenosis 08/11/2014  . Atrial flutter, paroxysmal (HCC) 01/05/2014  . On amiodarone therapy 05/21/2013  . Hypercholesteremia   . Osteopenia   . HTN (hypertension)   . Depressive  disorder, not elsewhere classified   . Anxiety   . Unspecified arthropathy, multiple sites   . Myalgia and myositis, unspecified   . COPD (chronic obstructive pulmonary disease) (HCC)   . Overactive bladder     Past Surgical History:  Procedure Laterality Date  . APPENDECTOMY  1983  . BACK SURGERY    . CARDIAC CATHETERIZATION     >5 yrs  . CATARACT EXTRACTION W/ INTRAOCULAR LENS  IMPLANT, BILATERAL Bilateral   . CESAREAN SECTION  1967  . LEFT ATRIAL APPENDAGE OCCLUSION N/A 12/31/2014   Procedure: LEFT ATRIAL APPENDAGE OCCLUSION;  Surgeon: Hillis Range, MD;  Location: [facility];  Service: Cardiovascular;  Laterality: N/A;  . POSTERIOR FUSION LUMBAR SPINE Bilateral 07/2014  . SHOULDER ARTHROSCOPY W/ ROTATOR CUFF REPAIR Left   . SHOULDER SURGERY Right    "tendon broke; couldn't be repaired"  . TEE WITHOUT CARDIOVERSION N/A 12/22/2014   Procedure: TRANSESOPHAGEAL ECHOCARDIOGRAM (TEE);  Surgeon: Chrystie Nose, MD;  Location: [facility] ENDOSCOPY;  Service: Cardiovascular;  Laterality: N/A;  . TEE WITHOUT CARDIOVERSION N/A 02/16/2015   post Watchman TEE with good seal and no leak around device   . TONSILLECTOMY    . TOTAL ABDOMINAL HYSTERECTOMY    . WISDOM TOOTH EXTRACTION      OB History    No data available       Home Medications    Prior to Admission medications   Medication Sig Start Date  End Date Taking? Authorizing Provider  Ascorbic Acid (VITAMIN C PO) Take 500 mg by mouth daily.    Yes Historical Provider, MD  aspirin 81 MG tablet Take 81 mg by mouth daily.   Yes Historical Provider, MD  bisoprolol (ZEBETA) 5 MG tablet Take 0.5 tablets (2.5 mg total) by mouth daily. 10/28/15  Yes Isaiah Serge, NP  Calcium-Vitamin D (CALTRATE 600 PLUS-VIT D PO) Take 1 tablet by mouth daily.    Yes Historical Provider, MD  cholecalciferol (VITAMIN D) 1000 UNITS tablet Take 1,000 Units by mouth daily.   Yes Historical Provider, MD  clonazePAM (KLONOPIN) 1 MG tablet Take 1.5-2 tablets  by mouth daily.  03/30/13  Yes Historical Provider, MD  Cyanocobalamin (VITAMIN B-12 PO) Take 1 tablet by mouth daily.    Yes Historical Provider, MD  dabigatran (PRADAXA) 150 MG CAPS capsule Take 1 capsule (150 mg total) by mouth 2 (two) times daily. 11/11/15  Yes Belva Crome, MD  FLUoxetine (PROZAC) 20 MG capsule Take 20 mg by mouth daily.  03/16/13  Yes Historical Provider, MD  furosemide (LASIX) 20 MG tablet Take 1 tablet (20 mg total) by mouth daily. 11/27/14  Yes Belva Crome, MD  Omega-3 Fatty Acids (FISH OIL PO) Take 1 tablet by mouth daily.    Yes Historical Provider, MD  oxybutynin (DITROPAN-XL) 10 MG 24 hr tablet Take 1 tablet by mouth daily. 03/22/13  Yes Historical Provider, MD  Potassium 75 MG TABS Take 1 tablet by mouth daily.   Yes Historical Provider, MD    Family History Family History  Problem Relation Age of Onset  . Aneurysm Father   . Hypotension Mother   . Neuropathy Brother   . Heart disease Brother   . Cancer      maternal side  . Heart Problems      paternal side  . Heart attack Neg Hx     Social History Social History  Substance Use Topics  . Smoking status: Former Smoker    Packs/day: 0.50    Years: 60.00    Types: Cigarettes    Quit date: 10/08/2015  . Smokeless tobacco: Never Used  . Alcohol use 0.0 oz/week     Comment: 10/08/2015 "don't drink at all right now; social drinker"     Allergies   Review of patient's allergies indicates no known allergies.   Review of Systems Review of Systems  Constitutional: Negative for fever.  HENT: Negative for congestion.   Eyes: Negative for visual disturbance.  Respiratory: Negative for shortness of breath.   Cardiovascular: Positive for palpitations. Negative for chest pain and leg swelling.  Gastrointestinal: Negative for abdominal pain.  Genitourinary: Negative for dysuria.  Musculoskeletal: Negative for back pain.  Neurological: Negative for syncope and headaches.  Hematological: Bruises/bleeds  easily.  Psychiatric/Behavioral: Negative for confusion.     Physical Exam Updated Vital Signs BP (!) 86/57   Pulse (!) 122   Temp 98.2 F (36.8 C)   Resp 22   Ht 5\' 6"  (1.676 m)   Wt 74.8 kg   SpO2 95%   BMI 26.63 kg/m   Physical Exam  Constitutional: She is oriented to person, place, and time. She appears well-developed and well-nourished. No distress.  HENT:  Head: Normocephalic and atraumatic.  Mouth/Throat: Oropharynx is clear and moist.  Eyes: Conjunctivae and EOM are normal. Pupils are equal, round, and reactive to light.  Neck: Normal range of motion.  Cardiovascular:  Tachycardic irregular rhythm  Pulmonary/Chest: Effort normal  and breath sounds normal. No respiratory distress. She has no wheezes. She has no rales.  Abdominal: Soft. Bowel sounds are normal. There is no tenderness.  Musculoskeletal: Normal range of motion. She exhibits no edema.  Neurological: She is alert and oriented to person, place, and time. No cranial nerve deficit. She exhibits normal muscle tone. Coordination normal.  Skin: Skin is warm.  Nursing note and vitals reviewed.    ED Treatments / Results  Labs (all labs ordered are listed, but only abnormal results are displayed) Labs Reviewed  CBC WITH DIFFERENTIAL/PLATELET - Abnormal; Notable for the following:       Result Value   WBC 14.1 (*)    Neutro Abs 11.2 (*)    All other components within normal limits  COMPREHENSIVE METABOLIC PANEL - Abnormal; Notable for the following:    Glucose, Bld 140 (*)    BUN 22 (*)    Creatinine, Ser 1.39 (*)    Albumin 3.3 (*)    ALT 13 (*)    GFR calc non Af Amer 36 (*)    GFR calc Af Amer 41 (*)    All other components within normal limits  BRAIN NATRIURETIC PEPTIDE - Abnormal; Notable for the following:    B Natriuretic Peptide 375.6 (*)    All other components within normal limits  PROTIME-INR - Abnormal; Notable for the following:    Prothrombin Time 15.4 (*)    All other components  within normal limits  I-STAT TROPOININ, ED    EKG  EKG Interpretation  Date/Time:  Thursday December 02 2015 16:07:22 EDT Ventricular Rate:  123 PR Interval:    QRS Duration: 78 QT Interval:  266 QTC Calculation: 380 R Axis:   80 Text Interpretation:  Atrial flutter with variable A-V block Septal infarct , age undetermined Inferolateral injury pattern Abnormal ECG Confirmed by Priyansh Pry  MD, [place] [phone]) on 12/02/2015 4:30:40 PM       Radiology Dg Chest Portable 1 View  Result Date: 12/02/2015 CLINICAL DATA:  Atrial fibrillation, shortness of breath for 2 days, history COPD, hypertension EXAM: PORTABLE CHEST 1 VIEW COMPARISON:  Portable exam 1624 hours compared 10/07/2015 FINDINGS: Enlargement of cardiac silhouette. Mediastinal contours normal. Emphysematous changes with increased interstitial infiltrates since previous exam likely representing increased pulmonary edema. No definite pleural effusion or pneumothorax. Marked osseous demineralization. IMPRESSION: Enlargement of cardiac silhouette with diffuse increase in interstitial infiltrates since previous exam favoring pulmonary edema though infection not excluded. Underlying COPD changes. Electronically Signed   By: Lavonia Dana M.D.   On: 12/02/2015 16:36    Procedures Procedures (including critical care time)  CRITICAL CARE Performed by: Fredia Sorrow Total critical care time: 30 minutes Critical care time was exclusive of separately billable procedures and treating other patients. Critical care was necessary to treat or prevent imminent or life-threatening deterioration. Critical care was time spent personally by me on the following activities: development of treatment plan with patient and/or surrogate as well as nursing, discussions with consultants, evaluation of patient's response to treatment, examination of patient, obtaining history from patient or surrogate, ordering and performing treatments and interventions, ordering  and review of laboratory studies, ordering and review of radiographic studies, pulse oximetry and re-evaluation of patient's condition.  Medications Ordered in ED Medications  0.9 %  sodium chloride infusion ( Intravenous Rate/Dose Change 12/02/15 1749)  sodium chloride 0.9 % bolus 250 mL (0 mLs Intravenous Stopped 12/02/15 1749)  diltiazem (CARDIZEM) injection 5 mg (5 mg Intravenous Given 12/02/15 1730)  diltiazem (  CARDIZEM) injection 5 mg (5 mg Intravenous Given 12/02/15 1745)  diltiazem (CARDIZEM) 100 mg in dextrose 5 % 100 mL (1 mg/mL) infusion (5 mg/hr Intravenous New Bag/Given 12/02/15 1849)     Initial Impression / Assessment and Plan / ED Course  I have reviewed the triage vital signs and the nursing notes.  Pertinent labs & imaging results that were available during my care of the patient were reviewed by me and considered in my medical decision making (see chart for details).  Clinical Course    Persistent atrial flutter with increased heart rate as high is of 120s here apparently at home it was higher. Some questions of low blood pressure breath and get was more runny his readings. Her pressure seems to be systolic [id number] which is baseline for patient. Patient's workup without any specific findings. Chest x-ray was some questionable maybe some slight pulmonary edema. Patient normally on 2 L of oxygen. Discussed with Dr. can document was on call for cardiology and we thought he was covering for [name]. Recommended small doses of diltiazem patient received 5 mg 2 with some improvement [place] heart rate down to like 108 or into the 90s. However it did not hold the patient was started on drip as per his recommendations. Plan was to have her admitted. As it turns out he was not in covering for [name] it was a miscommunication. Patient then she seemed by the cardiology fellow and he will admit her. Patient clinically could've been cardioverted since she has been on Prozac so for  over a month this started in mid August following a pulmonary embolus. Patient had never been cardioverted in the past because she was not on blood thinners. Patient's had long-standing atrial fib flutter but usually does not last for 2 days in a row. Patient was nontoxic no acute distress. Patient was never unstable.  Final Clinical Impressions(s) / ED Diagnoses   Final diagnoses:  Atrial flutter with rapid ventricular response [facility])    New Prescriptions New Prescriptions   No medications on file     Fredia Sorrow, MD 12/02/15 2118

## 2015-12-02 NOTE — Telephone Encounter (Signed)
Spoke with pt who states her pulse has been running between 52-135 at rest X2d Patient c/o headache X1d Pt would like RB recommendations before calling cardiology.  RB please advise.

## 2015-12-02 NOTE — Telephone Encounter (Signed)
She has paroxysmal A Fib, is on anti-coagulation for this and for PE. It sounds like she will need assistance with rate control - agree that cardiology needs to know that this is happening and that she likely will need to be seen by them or in urgent care to address.

## 2015-12-03 ENCOUNTER — Encounter (HOSPITAL_COMMUNITY): Payer: Self-pay | Admitting: *Deleted

## 2015-12-03 DIAGNOSIS — I483 Typical atrial flutter: Secondary | ICD-10-CM

## 2015-12-03 DIAGNOSIS — Z86711 Personal history of pulmonary embolism: Secondary | ICD-10-CM

## 2015-12-03 DIAGNOSIS — J449 Chronic obstructive pulmonary disease, unspecified: Secondary | ICD-10-CM

## 2015-12-03 LAB — BASIC METABOLIC PANEL
Anion gap: 11 (ref 5–15)
BUN: 19 mg/dL (ref 6–20)
CO2: 28 mmol/L (ref 22–32)
Calcium: 8.6 mg/dL — ABNORMAL LOW (ref 8.9–10.3)
Chloride: 102 mmol/L (ref 101–111)
Creatinine, Ser: 0.98 mg/dL (ref 0.44–1.00)
GFR calc Af Amer: >60 mL/min (ref >60)
GFR, EST NON AFRICAN AMERICAN: 54 mL/min — AB (ref >60)
GLUCOSE: 94 mg/dL (ref 65–99)
POTASSIUM: 4 mmol/L (ref 3.5–5.1)
Sodium: 141 mmol/L (ref 135–145)

## 2015-12-03 LAB — CBC
HEMATOCRIT: 41.1 % (ref 36.0–46.0)
Hemoglobin: 12.7 g/dL (ref 12.0–15.0)
MCH: 28.5 pg (ref 26.0–34.0)
MCHC: 30.9 g/dL (ref 30.0–36.0)
MCV: 92.2 fL (ref 78.0–100.0)
Platelets: 285 10*3/uL (ref 150–400)
RBC: 4.46 MIL/uL (ref 3.87–5.11)
RDW: 13.9 % (ref 11.5–15.5)
WBC: 12.4 10*3/uL — ABNORMAL HIGH (ref 4.0–10.5)

## 2015-12-03 LAB — T4, FREE: FREE T4: 1.1 ng/dL (ref 0.61–1.12)

## 2015-12-03 LAB — MRSA PCR SCREENING: MRSA by PCR: NEGATIVE

## 2015-12-03 LAB — TSH: TSH: 3.313 u[IU]/mL (ref 0.350–4.500)

## 2015-12-03 MED ORDER — OFF THE BEAT BOOK
Freq: Once | Status: AC
  Administered 2015-12-03: 01:00:00
  Filled 2015-12-03: qty 1

## 2015-12-03 NOTE — Progress Notes (Signed)
TELEMETRY: Reviewed telemetry pt in sinus brady rate 57. Converted from Atrial flutter about midnight. : Vitals:   12/03/15 0159 12/03/15 0300 12/03/15 0315 12/03/15 0742  BP: (!) 108/56 105/61 (!) 102/56 102/63  Pulse: (!) 47 (!) 47 (!) 47 (!) 49  Resp: 19 17 16 18   Temp:   97.8 F (36.6 C) 97.8 F (36.6 C)  TempSrc:   Oral Oral  SpO2: 98% 97% 97% 96%  Weight:   168 lb 14.4 oz (76.6 kg)   Height:        Intake/Output Summary (Last 24 hours) at 12/03/15 0944 Last data filed at 12/03/15 0500  Gross per 24 hour  Intake           501.83 ml  Output              350 ml  Net           151.83 ml   Filed Weights   12/02/15 1626 12/02/15 2258 12/03/15 0315  Weight: 165 lb (74.8 kg) 168 lb 14.4 oz (76.6 kg) 168 lb 14.4 oz (76.6 kg)    Subjective  Feels much better today. Wants to go home. No chest pain. States her heart goes out of rhythm weekly- this episode just lasted longer.   . bisoprolol  2.5 mg Oral Daily  . cholecalciferol  1,000 Units Oral Daily  . clonazePAM  1.5-2 mg Oral Daily  . dabigatran  150 mg Oral BID  . FLUoxetine  20 mg Oral Daily  . furosemide  20 mg Oral Daily  . mouth rinse  15 mL Mouth Rinse BID  . omega-3 acid ethyl esters  1 g Oral Daily  . oxybutynin  10 mg Oral Daily  . vitamin C  500 mg Oral Daily   . diltiazem (CARDIZEM) infusion Stopped (12/03/15 0025)    LABS: Basic Metabolic Panel:  Recent Labs  12/02/15 1611 12/03/15 0450  NA 137 141  K 4.0 4.0  CL 102 102  CO2 26 28  GLUCOSE 140* 94  BUN 22* 19  CREATININE 1.39* 0.98  CALCIUM 9.3 8.6*   Liver Function Tests:  Recent Labs  12/02/15 1611  AST 17  ALT 13*  ALKPHOS 62  BILITOT 0.5  PROT 7.4  ALBUMIN 3.3*   No results for input(s): LIPASE, AMYLASE in the last 72 hours. CBC:  Recent Labs  12/02/15 1611 12/03/15 0450  WBC 14.1* 12.4*  NEUTROABS 11.2*  --   HGB 14.1 12.7  HCT 44.3 41.1  MCV 91.3 92.2  PLT 320 285   Cardiac Enzymes: No results for input(s):  CKTOTAL, CKMB, CKMBINDEX, TROPONINI in the last 72 hours. BNP: No results for input(s): PROBNP in the last 72 hours. D-Dimer: No results for input(s): DDIMER in the last 72 hours. Hemoglobin A1C: No results for input(s): HGBA1C in the last 72 hours. Fasting Lipid Panel: No results for input(s): CHOL, HDL, LDLCALC, TRIG, CHOLHDL, LDLDIRECT in the last 72 hours. Thyroid Function Tests:  Recent Labs  12/03/15 0450  TSH 3.313     Radiology/Studies:  Dg Chest Portable 1 View  Result Date: 12/02/2015 CLINICAL DATA:  Atrial fibrillation, shortness of breath for 2 days, history COPD, hypertension EXAM: PORTABLE CHEST 1 VIEW COMPARISON:  Portable exam 1624 hours compared 10/07/2015 FINDINGS: Enlargement of cardiac silhouette. Mediastinal contours normal. Emphysematous changes with increased interstitial infiltrates since previous exam likely representing increased pulmonary edema. No definite pleural effusion or pneumothorax. Marked osseous demineralization. IMPRESSION: Enlargement of cardiac silhouette with  diffuse increase in interstitial infiltrates since previous exam favoring pulmonary edema though infection not excluded. Underlying COPD changes. Electronically Signed   By: Lavonia Dana M.D.   On: 12/02/2015 16:36    PHYSICAL EXAM General: elderly WF, in no acute distress. Head: Normal Neck: Negative for carotid bruits. JVD not elevated. No adenopathy Lungs: Clear bilaterally to auscultation without wheezes, rales, or rhonchi. Breathing is unlabored. Heart: RRR S1 S2 without murmurs, rubs, or gallops.  Abdomen: Soft, non-tender, non-distended with normoactive bowel sounds. No hepatomegaly. No rebound/guarding. No obvious abdominal masses. Extremities: No clubbing, cyanosis or edema.  Distal pedal pulses are 2+ and equal bilaterally. Neuro: Alert and oriented X 3. Moves all extremities spontaneously. Psych:  Responds to questions appropriately with a normal affect.  ASSESSMENT AND  PLAN: 1. Atrial flutter with RVR. Now converted to NSR spontaneously. IV cardizem stopped. Patient has long history of paroxysmal Afib. Had been on amiodarone in the past. This was discontinued in August due to concerns about pulmonary toxicity. Pulmonary consult felt this was not the case but patient did not want to continue amiodarone.  Had Watchman device placed in November 2016 due to history of cerebral bleed. In August 2017 she was admitted with a PE and started on Pradaxa.  2. COPD 3. History of PE 8/17. Now on Pradaxa.  4. HTN  Plan: patient is anxious for DC home. Since she is now on Pradaxa I have recommended stopping her ASA. Will arrange follow up in Afib clinic next week. ? Long term management of Afib/flutter. Consider Tikosyn therapy versus possible ablation.   Present on Admission: . Atrial fibrillation ([place])   Signed, Dhilan Brauer [place], [place] 12/03/2015 9:44 AM

## 2015-12-03 NOTE — Discharge Summary (Signed)
Discharge Summary    Patient ID: Judy White,  MRN: [id number], DOB/AGE: September 02, 1940 75 y.o.  Admit date: 12/02/2015 Discharge date: 12/03/2015  Primary Care Provider: GATES,ROBERT NEVILL Primary Cardiologist: Dr. Tamala Julian  Discharge Diagnoses    Active Problems:   Atrial fibrillation ([place])   Allergies No Known Allergies  Diagnostic Studies/Procedures    None _____________   History of Present Illness     75 y.o.femalewith medical history significant of COPD, HTN, paroxysmal-A.Fib, a recent PE in August, history of brain bleed.Had Watchman device placed in November 2016 due to history of cerebral bleed. Was on amiodarone in the past but was discontinued relates to concern about pulmonary toxicity. She was evaluated by pulmonary and felt this was not the case, but continued to remain off the medication. She presented with Afib and RVR with minimal symptoms. Her CHADSvASC is 4. She is currently on Pradaxa and ASA. Her rate is currently uncontrolled. In the ED she was started on IV dilt with rate improvement.   Hospital Course     Consultants: None  She was admitted to our service overnight and converted back to NSR with a rate around 50 around midnight. Her rate remained stable throughout the morning. TSH was normal. Considering she is currently on Pradaxa, will discontinue her daily ASA at discharge.   She was seen and assessed by Dr. Martinique and determined stable for discharge home. I have arranged for follow up in the Afib clinic next week. She will need further recommendations regarding long term management of her Afib/Aflutter. Tikosyn vs ablation? Will continue on her home medications at this time.  _____________  Discharge Vitals Blood pressure 102/63, pulse (!) 49, temperature 97.8 F (36.6 C), temperature source Oral, resp. rate 18, height 5\' 6"  (1.676 m), weight 168 lb 14.4 oz (76.6 kg), SpO2 96 %.  Filed Weights   12/02/15 1626 12/02/15 2258 12/03/15 0315  Weight:  165 lb (74.8 kg) 168 lb 14.4 oz (76.6 kg) 168 lb 14.4 oz (76.6 kg)    Labs & Radiologic Studies    CBC  Recent Labs  12/02/15 1611 12/03/15 0450  WBC 14.1* 12.4*  NEUTROABS 11.2*  --   HGB 14.1 12.7  HCT 44.3 41.1  MCV 91.3 92.2  PLT 320 [id number]   Basic Metabolic Panel  Recent Labs  12/02/15 1611 12/03/15 0450  NA 137 141  K 4.0 4.0  CL 102 102  CO2 26 28  GLUCOSE 140* 94  BUN 22* 19  CREATININE 1.39* 0.98  CALCIUM 9.3 8.6*   Liver Function Tests  Recent Labs  12/02/15 1611  AST 17  ALT 13*  ALKPHOS 62  BILITOT 0.5  PROT 7.4  ALBUMIN 3.3*   No results for input(s): LIPASE, AMYLASE in the last 72 hours. Cardiac Enzymes No results for input(s): CKTOTAL, CKMB, CKMBINDEX, TROPONINI in the last 72 hours. BNP Invalid input(s): POCBNP D-Dimer No results for input(s): DDIMER in the last 72 hours. Hemoglobin A1C No results for input(s): HGBA1C in the last 72 hours. Fasting Lipid Panel No results for input(s): CHOL, HDL, LDLCALC, TRIG, CHOLHDL, LDLDIRECT in the last 72 hours. Thyroid Function Tests  Recent Labs  12/03/15 0450  TSH 3.313   _____________  Dg Chest Portable 1 View  Result Date: 12/02/2015 CLINICAL DATA:  Atrial fibrillation, shortness of breath for 2 days, history COPD, hypertension EXAM: PORTABLE CHEST 1 VIEW COMPARISON:  Portable exam 1624 hours compared 10/07/2015 FINDINGS: Enlargement of cardiac silhouette. Mediastinal contours normal. Emphysematous  changes with increased interstitial infiltrates since previous exam likely representing increased pulmonary edema. No definite pleural effusion or pneumothorax. Marked osseous demineralization. IMPRESSION: Enlargement of cardiac silhouette with diffuse increase in interstitial infiltrates since previous exam favoring pulmonary edema though infection not excluded. Underlying COPD changes. Electronically Signed   By: Lavonia Dana M.D.   On: 12/02/2015 16:36   Disposition   Pt is being discharged home  today in good condition.  Follow-up Plans & Appointments    Follow-up Information    [facility] [place] Follow up on 12/07/2015.   Specialty:  Cardiology Why:  at 9:15 for your hospital follow up.  Contact information: [address] [id number] [place] [phone]         Discharge Instructions    Diet - low sodium heart healthy    Complete by:  As directed    Increase activity slowly    Complete by:  As directed       Discharge Medications   Current Discharge Medication List    CONTINUE these medications which have NOT CHANGED   Details  Ascorbic Acid (VITAMIN C PO) Take 500 mg by mouth daily.     bisoprolol (ZEBETA) 5 MG tablet Take 0.5 tablets (2.5 mg total) by mouth daily. Qty: 45 tablet, Refills: 3    Calcium-Vitamin D (CALTRATE 600 PLUS-VIT D PO) Take 1 tablet by mouth daily.     cholecalciferol (VITAMIN D) 1000 UNITS tablet Take 1,000 Units by mouth daily.    clonazePAM (KLONOPIN) 1 MG tablet Take 1.5-2 tablets by mouth daily.     Cyanocobalamin (VITAMIN B-12 PO) Take 1 tablet by mouth daily.     dabigatran (PRADAXA) 150 MG CAPS capsule Take 1 capsule (150 mg total) by mouth 2 (two) times daily. Qty: 180 capsule, Refills: 3    FLUoxetine (PROZAC) 20 MG capsule Take 20 mg by mouth daily.     furosemide (LASIX) 20 MG tablet Take 1 tablet (20 mg total) by mouth daily. Qty: 90 tablet, Refills: 3   Associated Diagnoses: Chronic diastolic CHF (congestive heart failure) (HCC)    Omega-3 Fatty Acids (FISH OIL PO) Take 1 tablet by mouth daily.     oxybutynin (DITROPAN-XL) 10 MG 24 hr tablet Take 1 tablet by mouth daily.    Potassium 75 MG TABS Take 1 tablet by mouth daily.      STOP taking these medications     aspirin 81 MG tablet           Outstanding Labs/Studies   None  Duration of Discharge Encounter   Greater than 30 minutes including physician time.  Signed, Reino Bellis  NP-C 12/03/2015, 10:26 AM

## 2015-12-03 NOTE — Discharge Instructions (Signed)
Atrial Flutter °Atrial flutter is a heart rhythm that can cause the heart to beat very fast (tachycardia). It originates in the upper chambers of the heart (atria). In atrial flutter, the top chambers of the heart (atria) often beat much faster than the bottom chambers of the heart (ventricles). Atrial flutter has a regular "saw toothed" appearance in an EKG readout. An EKG is a test that records the electrical activity of the heart. Atrial flutter can cause the heart to beat up to 150 beats per minute (BPM). Atrial flutter can either be short lived (paroxysmal) or permanent.  °CAUSES  °Causes of atrial flutter can be many. Some of these include: °· Heart related issues: °¨ Heart attack (myocardial infarction). °¨ Heart failure. °¨ Heart valve problems. °¨ Poorly controlled high blood pressure (hypertension). °¨ After open heart surgery. °· Lung related issues: °¨ A blood clot in the lungs (pulmonary embolism). °¨ Chronic obstructive pulmonary disease (COPD). Medications used to treat COPD can attribute to atrial flutter. °· Other related causes: °¨ Hyperthyroidism. °¨ Caffeine. °¨ Some decongestant cold medications. °¨ Low electrolyte levels such as potassium or magnesium. °¨ Cocaine. °SYMPTOMS °· An awareness of your heart beating rapidly (palpitations). °· Shortness of breath. °· Chest pain. °· Low blood pressure (hypotension). °· Dizziness or fainting. °DIAGNOSIS  °Different tests can be performed to diagnose atrial flutter.  °· An EKG. °· Holter monitor. This is a 24-hour recording of your heart rhythm. You will also be given a diary. Write down all symptoms that you have and what you were doing at the time you experienced symptoms. °· Cardiac event monitor. This small device can be worn for up to 30 days. When you have heart symptoms, you will push a button on the device. This will then record your heart rhythm. °· Echocardiogram. This is an imaging test to look at your heart. Your caregiver will look at your  heart valves and the ventricles. °· Stress test. This test can help determine if the atrial flutter is related to exercise or if coronary artery disease is present. °· Laboratory studies will look at certain blood levels like: °¨ Complete blood count (CBC). °¨ Potassium. °¨ Magnesium. °¨ Thyroid function. °TREATMENT  °Treatment of atrial flutter varies. A combination of therapies may be used or sometimes atrial flutter may need only 1 type of treatment.  °Lab work: °If your blood work, such as your electrolytes (potassium, magnesium) or your thyroid function tests, are abnormal, your caregiver will treat them accordingly.  °Medication:  °There are several different types of medications that can convert your heart to a normal rhythm and prevent atrial flutter from reoccurring.  °Nonsurgical procedures: °Nonsurgical techniques may be used to control atrial flutter. Some examples include: °· Cardioversion. This technique uses either drugs or an electrical shock to restore a normal heart rhythm: °¨ Cardioversion drugs may be given through an intravenous (IV) line to help "reset" the heart rhythm. °¨ In electrical cardioversion, your caregiver shocks your heart with electrical energy. This helps to reset the heartbeat to a normal rhythm. °· Ablation. If atrial flutter is a persistent problem, an ablation may be needed. This procedure is done under mild sedation. High frequency radio-wave energy is used to destroy the area of heart tissue responsible for atrial flutter. °SEEK IMMEDIATE MEDICAL CARE IF:  °You have: °· Dizziness. °· Near fainting or fainting. °· Shortness of breath. °· Chest pain or pressure. °· Sudden nausea or vomiting. °· Profuse sweating. °If you have the above symptoms,   call your local emergency service immediately! Do not drive yourself to the hospital. °MAKE SURE YOU:  °· Understand these instructions. °· Will watch your condition. °· Will get help right away if you are not doing well or get worse. °   °This information is not intended to replace advice given to you by your health care provider. Make sure you discuss any questions you have with your health care provider. °  °Document Released: 07/02/2008 Document Revised: 03/06/2014 Document Reviewed: 08/28/2014 °Elsevier Interactive Patient Education ©2016 [employer] Inc. ° °

## 2015-12-03 NOTE — Progress Notes (Signed)
Pt converted to sinus brady at 0011, per Lattie Haw at [facility] and telemetry strips.  BP 101/59, HR 47.  Pt with NAD.  Dr. Raiford Simmonds notified.  Cardizem gtt turned off per MD order.  Will give bisoprolol when pt's HR increases to 70s, per MD order.  Will continue to monitor.  Jodell Cipro

## 2015-12-07 ENCOUNTER — Ambulatory Visit (HOSPITAL_COMMUNITY)
Admit: 2015-12-07 | Discharge: 2015-12-07 | Disposition: A | Discharge status: Home / Self Care | Payer: Medicare Other | Attending: Nurse Practitioner | Admitting: Nurse Practitioner

## 2015-12-07 ENCOUNTER — Encounter (HOSPITAL_COMMUNITY): Payer: Self-pay | Admitting: Nurse Practitioner

## 2015-12-07 VITALS — BP 126/78 | HR 51 | Ht 66.0 in | Wt 169.2 lb

## 2015-12-07 DIAGNOSIS — F419 Anxiety disorder, unspecified: Secondary | ICD-10-CM | POA: Diagnosis not present

## 2015-12-07 DIAGNOSIS — F4000 Agoraphobia, unspecified: Secondary | ICD-10-CM | POA: Insufficient documentation

## 2015-12-07 DIAGNOSIS — M1990 Unspecified osteoarthritis, unspecified site: Secondary | ICD-10-CM | POA: Insufficient documentation

## 2015-12-07 DIAGNOSIS — I481 Persistent atrial fibrillation: Secondary | ICD-10-CM | POA: Insufficient documentation

## 2015-12-07 DIAGNOSIS — N3281 Overactive bladder: Secondary | ICD-10-CM | POA: Insufficient documentation

## 2015-12-07 DIAGNOSIS — M8580 Other specified disorders of bone density and structure, unspecified site: Secondary | ICD-10-CM | POA: Diagnosis not present

## 2015-12-07 DIAGNOSIS — Z87891 Personal history of nicotine dependence: Secondary | ICD-10-CM | POA: Insufficient documentation

## 2015-12-07 DIAGNOSIS — Z7901 Long term (current) use of anticoagulants: Secondary | ICD-10-CM | POA: Diagnosis not present

## 2015-12-07 DIAGNOSIS — I10 Essential (primary) hypertension: Secondary | ICD-10-CM | POA: Diagnosis not present

## 2015-12-07 DIAGNOSIS — F329 Major depressive disorder, single episode, unspecified: Secondary | ICD-10-CM | POA: Diagnosis not present

## 2015-12-07 DIAGNOSIS — I480 Paroxysmal atrial fibrillation: Secondary | ICD-10-CM

## 2015-12-07 DIAGNOSIS — R001 Bradycardia, unspecified: Secondary | ICD-10-CM | POA: Diagnosis not present

## 2015-12-07 DIAGNOSIS — Z86711 Personal history of pulmonary embolism: Secondary | ICD-10-CM | POA: Diagnosis not present

## 2015-12-07 DIAGNOSIS — I2782 Chronic pulmonary embolism: Secondary | ICD-10-CM | POA: Diagnosis not present

## 2015-12-07 DIAGNOSIS — J449 Chronic obstructive pulmonary disease, unspecified: Secondary | ICD-10-CM | POA: Diagnosis not present

## 2015-12-07 DIAGNOSIS — E7800 Pure hypercholesterolemia, unspecified: Secondary | ICD-10-CM | POA: Diagnosis not present

## 2015-12-07 MED ORDER — BISOPROLOL FUMARATE 5 MG PO TABS: 2.5000 mg | ORAL_TABLET | Freq: Every day | ORAL | 3 refills | Status: DC

## 2015-12-07 NOTE — Patient Instructions (Signed)
Hold bisoprolol for heart rate less than 55.  May take extra an 2.5mg  for greater than 6 hours of AFib.

## 2015-12-07 NOTE — Progress Notes (Signed)
Primary Care Physician: Henrine Screws, MD Primary Cardiologist: Tamala Julian Primary Electrophysiologist: Odette Horns Wire is a 75 y.o. female with a history of persistent atrial fibrillation who presents for follow up in the [facility]. She has been found to have DVT and PE since last being seen in the office. She has been compliant on Pradaxa without bleeding issues. She also has had readmission for symptomatic atrial fibrillation.  Her amiodarone was discontinued 2/2 concerns about pulmonary toxicity.  Since last being seen in clinic, the patient reports doing reasonably well. She has not had sustained atrial fibrillation but does have episodes that last several hours at a time. She is in SR today. She is still wearing oxygen around the clock but is hoping to be able to discontinue this with resolution of PE. Today, she denies symptoms of chest pain, orthopnea, PND, lower extremity edema, dizziness, presyncope, syncope, snoring, daytime somnolence, bleeding, or neurologic sequela. The patient is tolerating medications without difficulties and is otherwise without complaint today.    Past Medical History:  Diagnosis Date  . Agoraphobia   . Anxiety   . Arthritis    "normal for the age; nothing gives me problems" (10/08/2015)  . COPD (chronic obstructive pulmonary disease) ([place])    "mild" (10/08/2015)  . Depressive disorder, not elsewhere classified   . Hypercholesteremia   . Hypertension   . Myalgia and myositis, unspecified   . Osteopenia   . Overactive bladder   . Paroxysmal atrial fibrillation (HCC)    a. s/p Watchman implant 12/2014  . Pneumonia X 1   Past Surgical History:  Procedure Laterality Date  . APPENDECTOMY  1983  . BACK SURGERY    . CARDIAC CATHETERIZATION     >5 yrs  . CATARACT EXTRACTION W/ INTRAOCULAR LENS  IMPLANT, BILATERAL Bilateral   . CESAREAN SECTION  1967  . LEFT ATRIAL APPENDAGE OCCLUSION N/A 12/31/2014   Procedure: LEFT  ATRIAL APPENDAGE OCCLUSION;  Surgeon: Thompson Grayer, MD;  Location: [facility];  Service: Cardiovascular;  Laterality: N/A;  . POSTERIOR FUSION LUMBAR SPINE Bilateral 07/2014  . SHOULDER ARTHROSCOPY W/ ROTATOR CUFF REPAIR Left   . SHOULDER SURGERY Right    "tendon broke; couldn't be repaired"  . TEE WITHOUT CARDIOVERSION N/A 12/22/2014   Procedure: TRANSESOPHAGEAL ECHOCARDIOGRAM (TEE);  Surgeon: Pixie Casino, MD;  Location: [facility]COPY;  Service: Cardiovascular;  Laterality: N/A;  . TEE WITHOUT CARDIOVERSION N/A 02/16/2015   post Watchman TEE with good seal and no leak around device   . TONSILLECTOMY    . TOTAL ABDOMINAL HYSTERECTOMY    . WISDOM TOOTH EXTRACTION      Current Outpatient Prescriptions  Medication Sig Dispense Refill  . Ascorbic Acid (VITAMIN C PO) Take 500 mg by mouth daily.     . bisoprolol (ZEBETA) 5 MG tablet Take 0.5 tablets (2.5 mg total) by mouth daily. 45 tablet 3  . Calcium-Vitamin D (CALTRATE 600 PLUS-VIT D PO) Take 1 tablet by mouth daily.     . cholecalciferol (VITAMIN D) 1000 UNITS tablet Take 1,000 Units by mouth daily.    . clonazePAM (KLONOPIN) 1 MG tablet Take 1.5-2 tablets by mouth daily.     . Cyanocobalamin (VITAMIN B-12 PO) Take 1 tablet by mouth daily.     . dabigatran (PRADAXA) 150 MG CAPS capsule Take 1 capsule (150 mg total) by mouth 2 (two) times daily. 180 capsule 3  . FLUoxetine (PROZAC) 20 MG capsule Take 20 mg by  mouth daily.     . furosemide (LASIX) 20 MG tablet Take 1 tablet (20 mg total) by mouth daily. 90 tablet 3  . Omega-3 Fatty Acids (FISH OIL PO) Take 1 tablet by mouth daily.     Marland Kitchen oxybutynin (DITROPAN-XL) 10 MG 24 hr tablet Take 1 tablet by mouth daily.    . Potassium 99 MG TABS Take 1 tablet by mouth daily.     No current facility-administered medications for this encounter.     No Known Allergies  Social History   Social History  . Marital status: Married    Spouse name: N/A  . Number of children: N/A  . Years  of education: N/A   Occupational History  . Not on file.   Social History Main Topics  . Smoking status: Former Smoker    Packs/day: 0.50    Years: 60.00    Types: Cigarettes    Quit date: 10/08/2015  . Smokeless tobacco: Never Used  . Alcohol use 0.0 oz/week     Comment: 10/08/2015 "don't drink at all right now; social drinker"  . Drug use: No  . Sexual activity: No   Other Topics Concern  . Not on file   Social History Narrative  . No narrative on file    Family History  Problem Relation Age of Onset  . Aneurysm Father   . Hypotension Mother   . Neuropathy Brother   . Heart disease Brother   . Cancer      maternal side  . Heart Problems      paternal side  . Heart attack Neg Hx     ROS- All systems are reviewed and negative except as per the HPI above.  Physical Exam: Vitals:   12/07/15 0945  BP: 126/78  Pulse: (!) 51  Weight: 169 lb 3.2 oz (76.7 kg)  Height: 5\' 6"  (1.676 m)    GEN- The patient is well appearing, alert and oriented x 3 today.   Head- normocephalic, atraumatic Eyes-  Sclera clear, conjunctiva pink Ears- hearing intact Oropharynx- clear Neck- supple  Lungs- Clear to ausculation bilaterally, normal work of breathing Heart- Regular rate and rhythm, no murmurs, rubs or gallops  GI- soft, NT, ND, + BS Extremities- no clubbing, cyanosis, or edema MS- no significant deformity or atrophy Skin- no rash or lesion Psych- euthymic mood, full affect Neuro- strength and sensation are intact  Wt Readings from Last 3 Encounters:  12/07/15 169 lb 3.2 oz (76.7 kg)  12/03/15 168 lb 14.4 oz (76.6 kg)  11/25/15 169 lb 6.4 oz (76.8 kg)    EKG today demonstrates sinus bradycardia, rate 51 Echo 09/2015 demonstrated normal LVEF, no RWMA, LA 32  Epic records are reviewed at length today  Assessment and Plan:  1. Persistent atrial fibrillation Currently predominately maintaining SR off amiodarone. Will check amiodarone level today. I do think she will  eventually need another AAD drug (Tikosyn may be best option).  I have told her in the interim if she has episodes of AF that last longer than 6 hours, she can take an extra 2.5mg  of Bisoprolol.  She asks about ablation today, but she would need to be completely recovered from PE and off home O2 prior to considering.  She was previously not a candidate for ablation as she was off [place] with prior ICH.  She is s/p Watchman device with good device seal and so does not need long term anticoagulation for AF CHADS2VASC is 4  2. PE/DVT Managed  by pulmonary She thinks her shortness of breath has improved Repeat CT scan scheduled  Will defer to Dr Lamonte Sakai need for duration of Pradaxa. As above, she does not need long term [place] for AF as she is s/p Watchman.   3.  HTN Stable No change required today  4. Sinus bradycardia Asymptomatic and long standing  Plan - check amiodarone level today to determine if she would be candidate for Tikosyn. Follow up in AF clinic in 2 weeks.   [name], NP 12/07/2015 3:01 PM

## 2015-12-08 LAB — AMIODARONE LEVEL
Amiodarone Lvl: 1 ug/mL (ref 1.0–2.5)
N-DESETHYL-AMIODARONE: 0.9 ug/mL — AB (ref 1.0–2.5)

## 2015-12-16 ENCOUNTER — Ambulatory Visit (INDEPENDENT_AMBULATORY_CARE_PROVIDER_SITE_OTHER)
Admission: RE | Admit: 2015-12-16 | Discharge: 2015-12-16 | Disposition: A | Discharge status: Home / Self Care | Payer: Medicare Other | Source: Ambulatory Visit | Attending: Emergency Medicine | Admitting: Emergency Medicine

## 2015-12-16 DIAGNOSIS — R0902 Hypoxemia: Secondary | ICD-10-CM

## 2015-12-16 DIAGNOSIS — I2699 Other pulmonary embolism without acute cor pulmonale: Secondary | ICD-10-CM

## 2015-12-16 DIAGNOSIS — R918 Other nonspecific abnormal finding of lung field: Secondary | ICD-10-CM | POA: Diagnosis not present

## 2015-12-17 ENCOUNTER — Telehealth: Payer: Self-pay | Admitting: Emergency Medicine

## 2015-12-17 NOTE — Telephone Encounter (Signed)
Pt called saying she is in afib with HR around 110-115 but low BP around 90 systolic. On last visit, Amber had told pt that she could take an extra 2.5 mg bystolic if recurrent afib, however BP is low. She had just taken am bystolic when she called. Encouraged to increase water intake today. IF still in afib tonight with HR over 100 and sys BP over 100, she can take the extra bystolic. Has an appointment next week to discuss tikosyn or ablation. Recent amiodarone level was low, now off for several weeks.

## 2015-12-17 NOTE — Telephone Encounter (Signed)
Please let the patinet know that I have looked at her CT scan, that it shows more areas of infiltrate / nodularity compared with her prior scan. I would like to see her at [facility] asap to discuss the scan further, decide whether we need to do any other evaluation to figure out what the nodular areas are.

## 2015-12-17 NOTE — Telephone Encounter (Signed)
RB please advise of results of the CT scan.  Pt is calling for results.  Pt is aware that once we hear back from [place] we will call her with these results.

## 2015-12-17 NOTE — Telephone Encounter (Signed)
ATC x 2 and line was busy, WCB 

## 2015-12-20 NOTE — Telephone Encounter (Signed)
Spoke with pt and given appt with RB 12/27/15 at 1:30

## 2015-12-23 ENCOUNTER — Ambulatory Visit (HOSPITAL_COMMUNITY): Payer: Self-pay | Admitting: Nurse Practitioner

## 2015-12-24 ENCOUNTER — Ambulatory Visit (HOSPITAL_COMMUNITY)
Admission: RE | Admit: 2015-12-24 | Discharge: 2015-12-24 | Disposition: A | Discharge status: Home / Self Care | Payer: Medicare Other | Source: Ambulatory Visit | Attending: Nurse Practitioner | Admitting: Nurse Practitioner

## 2015-12-24 VITALS — BP 114/62 | HR 42 | Ht 66.0 in | Wt 172.0 lb

## 2015-12-24 DIAGNOSIS — I4891 Unspecified atrial fibrillation: Secondary | ICD-10-CM | POA: Insufficient documentation

## 2015-12-24 DIAGNOSIS — I480 Paroxysmal atrial fibrillation: Secondary | ICD-10-CM

## 2015-12-24 DIAGNOSIS — R9431 Abnormal electrocardiogram [ECG] [EKG]: Secondary | ICD-10-CM | POA: Insufficient documentation

## 2015-12-25 ENCOUNTER — Other Ambulatory Visit: Payer: Self-pay | Admitting: Interventional Cardiology

## 2015-12-25 DIAGNOSIS — I5032 Chronic diastolic (congestive) heart failure: Secondary | ICD-10-CM

## 2015-12-27 ENCOUNTER — Ambulatory Visit: Payer: Medicare Other | Admitting: Physician Assistant

## 2015-12-27 ENCOUNTER — Ambulatory Visit: Payer: Medicare Other | Admitting: Emergency Medicine

## 2015-12-27 NOTE — Progress Notes (Signed)
Primary Care Physician: Henrine Screws, MD Referring 10 f/u Cardiologist: Dr. Tamala Julian EP: Dr. Odette Horns Dust is a 75 y.o. female with a h/o PAF. She had been on amiodarone but it was d/c'ed due to lung toxicity concerns. She has had a Watchman placed, 11/16, but developed a PE in August and is now on Pradaxa, no bleeding issues. This visit was to discuss further treatment of her afib, tikosyn or ablation, but in the interim she has had an abnormal CT of the lungs, concern for malignancy, and is pending further evaluation with pulmonologist on 10/30. Plans for restoration of SR will hinge on pulmonology treatment, due to concerns for possible surgery and interruption of [place]. Pt has h/o bradycardia when in SR on low dose Bystolic, possible early tacy/brady syndrome..  Today, she denies symptoms of  chest pain, , orthopnea, PND, lower extremity edema, dizziness, presyncope, syncope, or neurologic sequela. Positive for shortness of breath and palpitations.The patient is tolerating medications without difficulties and is otherwise without complaint today.   Past Medical History:  Diagnosis Date  . Agoraphobia   . Anxiety   . Arthritis    "normal for the age; nothing gives me problems" (10/08/2015)  . COPD (chronic obstructive pulmonary disease) ([place])    "mild" (10/08/2015)  . Depressive disorder, not elsewhere classified   . Hypercholesteremia   . Hypertension   . Myalgia and myositis, unspecified   . Osteopenia   . Overactive bladder   . Paroxysmal atrial fibrillation (HCC)    a. s/p Watchman implant 12/2014  . Pneumonia X 1   Past Surgical History:  Procedure Laterality Date  . APPENDECTOMY  1983  . BACK SURGERY    . CARDIAC CATHETERIZATION     >5 yrs  . CATARACT EXTRACTION W/ INTRAOCULAR LENS  IMPLANT, BILATERAL Bilateral   . CESAREAN SECTION  1967  . LEFT ATRIAL APPENDAGE OCCLUSION N/A 12/31/2014   Procedure: LEFT ATRIAL APPENDAGE OCCLUSION;  Surgeon: Thompson Grayer, MD;  Location: [facility];  Service: Cardiovascular;  Laterality: N/A;  . POSTERIOR FUSION LUMBAR SPINE Bilateral 07/2014  . SHOULDER ARTHROSCOPY W/ ROTATOR CUFF REPAIR Left   . SHOULDER SURGERY Right    "tendon broke; couldn't be repaired"  . TEE WITHOUT CARDIOVERSION N/A 12/22/2014   Procedure: TRANSESOPHAGEAL ECHOCARDIOGRAM (TEE);  Surgeon: Pixie Casino, MD;  Location: [facility];  Service: Cardiovascular;  Laterality: N/A;  . TEE WITHOUT CARDIOVERSION N/A 02/16/2015   post Watchman TEE with good seal and no leak around device   . TONSILLECTOMY    . TOTAL ABDOMINAL HYSTERECTOMY    . WISDOM TOOTH EXTRACTION      Current Outpatient Prescriptions  Medication Sig Dispense Refill  . Ascorbic Acid (VITAMIN C PO) Take 500 mg by mouth daily.     . bisoprolol (ZEBETA) 5 MG tablet Take 0.5 tablets (2.5 mg total) by mouth daily. 45 tablet 3  . Calcium-Vitamin D (CALTRATE 600 PLUS-VIT D PO) Take 1 tablet by mouth daily.     . cholecalciferol (VITAMIN D) 1000 UNITS tablet Take 1,000 Units by mouth daily.    . clonazePAM (KLONOPIN) 1 MG tablet Take 1.5-2 tablets by mouth daily.     . Cyanocobalamin (VITAMIN B-12 PO) Take 1 tablet by mouth daily.     . dabigatran (PRADAXA) 150 MG CAPS capsule Take 1 capsule (150 mg total) by mouth 2 (two) times daily. 180 capsule 3  . FLUoxetine (PROZAC) 20 MG capsule Take 20 mg  by mouth daily.     . furosemide (LASIX) 20 MG tablet Take 1 tablet (20 mg total) by mouth daily. 90 tablet 3  . Omega-3 Fatty Acids (FISH OIL PO) Take 1 tablet by mouth daily.     Marland Kitchen oxybutynin (DITROPAN-XL) 10 MG 24 hr tablet Take 1 tablet by mouth daily.    . Potassium 99 MG TABS Take 1 tablet by mouth daily.     No current facility-administered medications for this encounter.     No Known Allergies  Social History   Social History  . Marital status: Married    Spouse name: N/A  . Number of children: N/A  . Years of education: N/A   Occupational History    . Not on file.   Social History Main Topics  . Smoking status: Former Smoker    Packs/day: 0.50    Years: 60.00    Types: Cigarettes    Quit date: 10/08/2015  . Smokeless tobacco: Never Used  . Alcohol use 0.0 oz/week     Comment: 10/08/2015 "don't drink at all right now; social drinker"  . Drug use: No  . Sexual activity: No   Other Topics Concern  . Not on file   Social History Narrative  . No narrative on file    Family History  Problem Relation Age of Onset  . Aneurysm Father   . Hypotension Mother   . Neuropathy Brother   . Heart disease Brother   . Cancer      maternal side  . Heart Problems      paternal side  . Heart attack Neg Hx     ROS- All systems are reviewed and negative except as per the HPI above  Physical Exam: Vitals:   12/24/15 1057  BP: 114/62  Pulse: (!) 42  Weight: 172 lb (78 kg)  Height: 5\' 6"  (1.676 m)    GEN- The patient is well appearing, alert and oriented x 3 today, wearing O2 continuous  via [place].   Head- normocephalic, atraumatic Eyes-  Sclera clear, conjunctiva pink Ears- hearing intact Oropharynx- clear Neck- supple, no JVP Lymph- no cervical lymphadenopathy Lungs- Clear to ausculation bilaterally, normal work of breathing Heart- Regular rate and rhythm, no murmurs, rubs or gallops, PMI not laterally displaced GI- soft, NT, ND, + BS Extremities- no clubbing, cyanosis, or edema MS- no significant deformity or atrophy Skin- no rash or lesion Psych- euthymic mood, full affect Neuro- strength and sensation are intact  EKG- Marked sinus brady at 42 bpm, pr int 158 ms, qrs int 72 ms, qtc 414 Epic records reviewed Echo-- Left ventricle: The cavity size was normal. Systolic function was   normal. Wall motion was normal; there were no regional wall   motion abnormalities. The study is not technically sufficient to   allow evaluation of LV diastolic function. - Aortic valve: Transvalvular velocity was within the normal range.    There was no stenosis. There was no regurgitation. - Mitral valve: Transvalvular velocity was within the normal range.   There was no evidence for stenosis. There was no regurgitation. - Right ventricle: The cavity size was normal. Wall thickness was   normal. Systolic function was normal. - Atrial septum: No defect or patent foramen ovale was identified   by color flow Doppler. - Tricuspid valve: There was trivial regurgitation. - Pulmonary arteries: Systolic pressure was within the normal   range. PA peak pressure: 28 mm Hg (S).  Assessment and Plan: 1. PAF Off amiodarone  due to lung toxicity concerns To be considered for Tikosyn but will depend on plans for treatment of abnormal chest CT She will be seeing pulmonology 10/30 and will then proceed after knowledge of their plans. She has to be  on uninterrupted DOAC for 3 weeks prior to [place] and one month after tikosyn which may pose a problem if surgery is planned for lung issues. Continue pradaxa Continue low dose bisoprolol and watch for symptomatic brady, early tachy/brady syndrome  Will be back in touch with pt once pulmonology plans are known.   Geroge Baseman Naithen [place], [facility] [address] [place], [place] [address] [phone]

## 2015-12-28 ENCOUNTER — Ambulatory Visit (INDEPENDENT_AMBULATORY_CARE_PROVIDER_SITE_OTHER): Payer: Medicare Other | Admitting: Emergency Medicine

## 2015-12-28 ENCOUNTER — Encounter: Payer: Self-pay | Admitting: Emergency Medicine

## 2015-12-28 DIAGNOSIS — J42 Unspecified chronic bronchitis: Secondary | ICD-10-CM

## 2015-12-28 DIAGNOSIS — J849 Interstitial pulmonary disease, unspecified: Secondary | ICD-10-CM | POA: Diagnosis not present

## 2015-12-28 NOTE — Progress Notes (Signed)
History of Present Illness Judy White is a 75 y.o. female current smoker with recent PE started on Pradaxa 10/07/15, COPD, HTN, paroxysmal-A.Fib, patient previously not on blood thinners despite CHADS vasc of 4 due to history of brain bleed. She has a Watchman device in her left atrial appendage.    HPI:75 yo woman with hx A fib and watchman placement, on amiodarone but not anti-coag since 01/2015 due to concern regarding hx of SDH in 2007. Also w hx HTN, tobacco use and COPD. She was admitted with light-headedness, near-syncope 8/11, found to be hypoxemic. CT chest 8/11 showed scattered RLL PE's, some evidence for interstitial edema and GGI. LE doppler study showed RLE DVT. She was been started on therapeutic enoxaparin.and transitioned to Pradaxa at discharge. She was discharged home on oxygen. Amiodarone was stopped. She stopped smoking after the hospitalization.   She reports that she had a hospitalization beginning of October for atrial fibrillation. Her breathing has improved some. She has fatigue, no new resp sx. No new exposures. Not smoking. No mold exposure. No pets / no birds or bats.   She underwent a CT scan of her chest on 12/16/15 that I personally reviewed. This showed changes in the pre-existing groundglass inflammation that had been seen on her August CT scan. Much of this has cleared but now there is significant scattered more consolidated infiltrate and a loose nodular pattern. Most notable was in the lingula 3.9 x 2.2 cm.  Also noted was some possible right hilar lymphadenopathy.      Tests CT-PA 8/11 >> several RLL pulmonary emboli, evidence for pulmonary vascular congestion with some basilar more confluent GG change LE doppler US 8/12 >> RLE DVT ECHO  10/09/2015>> PAP is 28 mm Hg  Past medical hx Past Medical History:  Diagnosis Date  . Agoraphobia   . Anxiety   . Arthritis    "normal for the age; nothing gives me problems" (10/08/2015)  . COPD (chronic obstructive  pulmonary disease) ([place])    "mild" (10/08/2015)  . Depressive disorder, not elsewhere classified   . Hypercholesteremia   . Hypertension   . Myalgia and myositis, unspecified   . Osteopenia   . Overactive bladder   . Paroxysmal atrial fibrillation (HCC)    a. s/p Watchman implant 12/2014  . Pneumonia X 1     Past surgical hx, Family hx, Social hx all reviewed.  Current Outpatient Prescriptions on File Prior to Visit  Medication Sig  . Ascorbic Acid (VITAMIN C PO) Take 500 mg by mouth daily.   . bisoprolol (ZEBETA) 5 MG tablet Take 0.5 tablets (2.5 mg total) by mouth daily.  . Calcium-Vitamin D (CALTRATE 600 PLUS-VIT D PO) Take 1 tablet by mouth daily.   . cholecalciferol (VITAMIN D) 1000 UNITS tablet Take 1,000 Units by mouth daily.  . clonazePAM (KLONOPIN) 1 MG tablet Take 1.5-2 tablets by mouth daily.   . Cyanocobalamin (VITAMIN B-12 PO) Take 1 tablet by mouth daily.   . dabigatran (PRADAXA) 150 MG CAPS capsule Take 1 capsule (150 mg total) by mouth 2 (two) times daily.  Marland Kitchen FLUoxetine (PROZAC) 20 MG capsule Take 20 mg by mouth daily.   . furosemide (LASIX) 20 MG tablet TAKE 1 TABLET ONCE DAILY.  Marland Kitchen Omega-3 Fatty Acids (FISH OIL PO) Take 1 tablet by mouth daily.   Marland Kitchen oxybutynin (DITROPAN-XL) 10 MG 24 hr tablet Take 1 tablet by mouth daily.  . Potassium 99 MG TABS Take 1 tablet by mouth daily.  No current facility-administered medications on file prior to visit.      No Known Allergies  Review Of Systems:  Constitutional:   No  weight loss, night sweats,  Fevers, chills, fatigue, or  lassitude.  HEENT:   No headaches,  Difficulty swallowing,  Tooth/dental problems, or  Sore throat,                No sneezing, itching, ear ache, nasal congestion, post nasal drip,   CV:  No chest pain,  Orthopnea, PND, + swelling in lower extremities, anasarca, dizziness, palpitations, syncope.   GI  No heartburn, indigestion, abdominal pain, nausea, vomiting, diarrhea, change in bowel habits,  loss of appetite, bloody stools.   Resp: + shortness of breath with exertion less at rest.  No excess mucus, no productive cough,  + non-productive cough,  No coughing up of blood.  No change in color of mucus.  No wheezing.  No chest wall deformity  Skin: no rash or lesions.  GU: no dysuria, change in color of urine, no urgency or frequency.  No flank pain, no hematuria   MS:  No joint pain or swelling.  No decreased range of motion.  + back pain.  Psych:  No change in mood or affect. No depression or anxiety.  No memory loss.   Vital Signs BP 122/72 (BP Location: Right Arm, Cuff Size: Normal)   Pulse 60   Ht 5' 6.5" (1.689 m)   Wt 174 lb (78.9 kg)   SpO2 91%   BMI 27.66 kg/m   Vitals:   12/28/15 1328  BP: 122/72  Pulse: 60  SpO2: 91%  Weight: 174 lb (78.9 kg)  Height: 5' 6.5" (1.689 m)   Physical Exam:  General- No distress,  A&Ox3, pleasant ENT: No sinus tenderness, TM clear, pale nasal mucosa, no oral exudate,no post nasal drip, no LAN Cardiac: S1, S2, regular rate and rhythm, no murmur Chest: No wheeze/ + crackles / dullness; no accessory muscle use, no nasal flaring, no sternal retractions Abd.: Soft Non-tender Ext: No clubbing cyanosis, edema Neuro:  normal strength Skin: No rashes, warm and dry Psych: normal mood and behavior   Assessment/Plan  Interstitial lung disease (HCC) Her CT scan of the chest now shows evidence for a patchy nodular process that is widespread and bilateral, largest in the lingula. Would be atypical for a clearing process from either amiodarone toxicity or RB-ILD. No Other clear exposures. No new medications except for beta blocker and Pradaxa. I doubt that this is malignancy given the rapidity of the appearance of these infiltrates but certainly it is on the differential diagnosis. I believe she needs a bronchoscopy to further evaluate. She agrees. We will arrange for this as soon as possible after she has been stabilized on Tikosyn. She  will need to be off anti-coag for bx's. We will attempt to coordinate with Cardiology.   COPD (chronic obstructive pulmonary disease) (HCC) We will get PFT soon.     Baltazar Apo, MD, PhD 12/28/2015, 2:10 PM [facility] [phone] or if no answer [phone]

## 2015-12-28 NOTE — Assessment & Plan Note (Signed)
We will get PFT soon.

## 2015-12-28 NOTE — Patient Instructions (Addendum)
You CT scan shows some continued nodular infiltrates. You would probably benefit from bronchoscopy with washings and biopsies. We will plan to do this after you have been hospitalized to start Tikosyn, when it is safe to come off anticoagulation.  Continue your Pradaxa for now.  Follow with Dr Lamonte Sakai in 4-6 weeks.

## 2015-12-28 NOTE — Assessment & Plan Note (Signed)
Her CT scan of the chest now shows evidence for a patchy nodular process that is widespread and bilateral, largest in the lingula. Would be atypical for a clearing process from either amiodarone toxicity or RB-ILD. No Other clear exposures. No new medications except for beta blocker and Pradaxa. I doubt that this is malignancy given the rapidity of the appearance of these infiltrates but certainly it is on the differential diagnosis. I believe she needs a bronchoscopy to further evaluate. She agrees. We will arrange for this as soon as possible after she has been stabilized on Tikosyn. She will need to be off anti-coag for bx's. We will attempt to coordinate with Cardiology.

## 2015-12-30 ENCOUNTER — Telehealth (HOSPITAL_COMMUNITY): Payer: Self-pay | Admitting: *Deleted

## 2015-12-30 NOTE — Telephone Encounter (Signed)
Patient called stating she is interested in pursuing tikosyn after speaking with her pulmonologist. She is concerned regarding the monthly price of the medication in conjunction with the donut hole for the rest of the year.  Roderic Palau NP reviewed medications and with patient on prozac she will need to talk with her PCP in regards to stopping this medication and how she should go about weaning self off. Pt will call back after speaking with PCP for next steps.

## 2015-12-31 ENCOUNTER — Ambulatory Visit (INDEPENDENT_AMBULATORY_CARE_PROVIDER_SITE_OTHER): Payer: Medicare Other | Admitting: Emergency Medicine

## 2015-12-31 DIAGNOSIS — R938 Abnormal findings on diagnostic imaging of other specified body structures: Secondary | ICD-10-CM

## 2015-12-31 DIAGNOSIS — I2699 Other pulmonary embolism without acute cor pulmonale: Secondary | ICD-10-CM

## 2015-12-31 DIAGNOSIS — R9389 Abnormal findings on diagnostic imaging of other specified body structures: Secondary | ICD-10-CM

## 2015-12-31 DIAGNOSIS — R0902 Hypoxemia: Secondary | ICD-10-CM

## 2015-12-31 LAB — PULMONARY FUNCTION TEST
DL/VA % PRED: 63 %
DL/VA: 3.2 ml/min/mmHg/L
DLCO COR % PRED: 35 %
DLCO COR: 9.6 ml/min/mmHg
DLCO UNC % PRED: 37 %
DLCO unc: 10.21 ml/min/mmHg
FEF 25-75 POST: 0.95 L/s
FEF 25-75 PRE: 1.09 L/s
FEF2575-%CHANGE-POST: -12 %
FEF2575-%PRED-POST: 57 %
FEF2575-%PRED-PRE: 66 %
FEV1-%Change-Post: -1 %
FEV1-%PRED-PRE: 60 %
FEV1-%Pred-Post: 59 %
FEV1-Post: 1.32 L
FEV1-Pre: 1.34 L
FEV1FVC-%CHANGE-POST: -2 %
FEV1FVC-%PRED-PRE: 104 %
FEV6-%CHANGE-POST: 2 %
FEV6-%PRED-PRE: 60 %
FEV6-%Pred-Post: 62 %
FEV6-Post: 1.75 L
FEV6-Pre: 1.71 L
FEV6FVC-%Change-Post: 0 %
FEV6FVC-%Pred-Post: 105 %
FEV6FVC-%Pred-Pre: 104 %
FVC-%Change-Post: 1 %
FVC-%PRED-POST: 59 %
FVC-%PRED-PRE: 58 %
FVC-POST: 1.75 L
FVC-PRE: 1.73 L
POST FEV1/FVC RATIO: 75 %
PRE FEV1/FVC RATIO: 77 %
Post FEV6/FVC ratio: 100 %
Pre FEV6/FVC Ratio: 99 %
RV % pred: 73 %
RV: 1.8 L
TLC % pred: 66 %
TLC: 3.58 L

## 2015-12-31 NOTE — Telephone Encounter (Signed)
Patient called back today stating she talked with her psychiatrist and can just stop prozac without weaning. She sees Dr. Lamonte Sakai on Monday to go over results of CT and PFTs and will call us with next steps following this appointment.

## 2016-01-03 ENCOUNTER — Ambulatory Visit (INDEPENDENT_AMBULATORY_CARE_PROVIDER_SITE_OTHER): Payer: Medicare Other | Admitting: Emergency Medicine

## 2016-01-03 ENCOUNTER — Encounter: Payer: Self-pay | Admitting: Emergency Medicine

## 2016-01-03 DIAGNOSIS — I480 Paroxysmal atrial fibrillation: Secondary | ICD-10-CM | POA: Diagnosis not present

## 2016-01-03 DIAGNOSIS — I2782 Chronic pulmonary embolism: Secondary | ICD-10-CM

## 2016-01-03 DIAGNOSIS — R938 Abnormal findings on diagnostic imaging of other specified body structures: Secondary | ICD-10-CM | POA: Diagnosis not present

## 2016-01-03 DIAGNOSIS — J42 Unspecified chronic bronchitis: Secondary | ICD-10-CM

## 2016-01-03 DIAGNOSIS — I2690 Septic pulmonary embolism without acute cor pulmonale: Secondary | ICD-10-CM | POA: Diagnosis not present

## 2016-01-03 DIAGNOSIS — R9389 Abnormal findings on diagnostic imaging of other specified body structures: Secondary | ICD-10-CM

## 2016-01-03 NOTE — Telephone Encounter (Signed)
Pt cld back today.  She has seen Dr. Lamonte Sakai and he has cleared her to proceed with Tikosyn load.  Pt has not taken prozac since Wednesday.  I let the pt know we would send a note to the [facility] street pharmacist to get her in for consult for Tikosyn load.Judy White

## 2016-01-03 NOTE — Progress Notes (Signed)
Patient seen in the office today and instructed on use of Spiriva Respimat.  Patient expressed understanding and demonstrated technique.  

## 2016-01-03 NOTE — Assessment & Plan Note (Signed)
Continue pradaxa as ordered.

## 2016-01-03 NOTE — Patient Instructions (Signed)
Please continue to follow with Dr Rayann Heman regarding initiation of tykosyn.  We will plan to perform bronchoscopy once you are stable on the tykosyn We will do a trial starting of Spiriva Respimat 2 sprays once a day. Take this for about 3-4 weeks to see if we benefit.  Continue Pradaxa as you are taking it.  Follow with Dr Lamonte Sakai in 2 months or sooner if you have any problems.

## 2016-01-03 NOTE — Assessment & Plan Note (Signed)
As discussed previously she needs FOB with BAL and TBBxs. Will plan to do this once she is stabilized from A Fib standpoint. Will coordinate once she is on tykosyn.

## 2016-01-03 NOTE — Assessment & Plan Note (Signed)
Planning to start tykosyn w Dr Rayann Heman. She may need long term anticoag, will need to discuss with cardiology as we go forward.

## 2016-01-03 NOTE — Progress Notes (Signed)
History of Present Illness Judy White is a 75 y.o. female current smoker with recent PE started on Pradaxa 10/07/15, COPD, HTN, paroxysmal-A.Fib, patient previously not on blood thinners despite CHADS vasc of 4 due to history of brain bleed. She has a Watchman device in her left atrial appendage.    HPI:75 yo woman with hx A fib and watchman placement, on amiodarone but not anti-coag since 01/2015 due to concern regarding hx of SDH in 2007. Also w hx HTN, tobacco use and COPD. She was admitted with light-headedness, near-syncope 8/11, found to be hypoxemic. CT chest 8/11 showed scattered RLL PE's, some evidence for interstitial edema and GGI. LE doppler study showed RLE DVT. She was been started on therapeutic enoxaparin.and transitioned to Pradaxa at discharge. She was discharged home on oxygen. Amiodarone was stopped. She stopped smoking after the hospitalization.   She reports that she had a hospitalization beginning of October for atrial fibrillation. Her breathing has improved some. She has fatigue, no new resp sx. No new exposures. Not smoking. No mold exposure. No pets / no birds or bats.   She underwent a CT scan of her chest on 12/16/15 that I personally reviewed. This showed changes in the pre-existing groundglass inflammation that had been seen on her August CT scan. Much of this has cleared but now there is significant scattered more consolidated infiltrate and a loose nodular pattern. Most notable was in the lingula 3.9 x 2.2 cm.  Also noted was some possible right hilar lymphadenopathy.    ROV 01/03/16 -- the patient returns for evaluation of her history of pulmonary embolism, abnormal CT scan of the chest, former tobacco use.. As detailed above she has scattered loose nodular disease on CT scan that we will likely need BAL and biopsy. She is currently undergoing evaluation for initiation of Tykosyn and I would like for her to have this done before we performed bronchoscopy. She underwent  pulmonary function testing 12/31/15 and I personally reviewed the results. The spirometry shows mixed obstruction and restriction without a bronchodilator response. Lung volumes are restricted. Diffusion capacity is decreased and does not fully correct when adjusted for alveolar volume.    Tests CT-PA 8/11 >> several RLL pulmonary emboli, evidence for pulmonary vascular congestion with some basilar more confluent GG change LE doppler US 8/12 >> RLE DVT ECHO  10/09/2015>> PAP is 28 mm Hg   Review Of Systems:  Constitutional:   No  weight loss, night sweats,  Fevers, chills, fatigue, or  lassitude.  HEENT:   No headaches,  Difficulty swallowing,  Tooth/dental problems, or  Sore throat,                No sneezing, itching, ear ache, nasal congestion, post nasal drip,   CV:  No chest pain,  Orthopnea, PND, + swelling in lower extremities, anasarca, dizziness, palpitations, syncope.   GI  No heartburn, indigestion, abdominal pain, nausea, vomiting, diarrhea, change in bowel habits, loss of appetite, bloody stools.   Resp: + shortness of breath with exertion less at rest.  No excess mucus, no productive cough,  + non-productive cough,  No coughing up of blood.  No change in color of mucus.  No wheezing.  No chest wall deformity  Skin: no rash or lesions.  GU: no dysuria, change in color of urine, no urgency or frequency.  No flank pain, no hematuria   MS:  No joint pain or swelling.  No decreased range of motion.  + back pain.  Psych:  No change in mood or affect. No depression or anxiety.  No memory loss.   Vital Signs BP 126/84 (BP Location: Left Arm, Cuff Size: Normal)   Pulse (!) 120   Wt 173 lb (78.5 kg)   SpO2 90%   BMI 27.50 kg/m   Vitals:   01/03/16 1103  BP: 126/84  Pulse: (!) 120  SpO2: 90%  Weight: 173 lb (78.5 kg)   Physical Exam:  General- No distress,  A&Ox3, pleasant ENT: No sinus tenderness, TM clear, pale nasal mucosa, no oral exudate,no post nasal drip, no  LAN Cardiac: S1, S2, regular rate and rhythm, no murmur Chest: No wheeze/ + crackles / dullness; no accessory muscle use, no nasal flaring, no sternal retractions Abd.: Soft Non-tender Ext: No clubbing cyanosis, edema Neuro:  normal strength Skin: No rashes, warm and dry Psych: normal mood and behavior   Assessment/Plan  Pulmonary embolism (HCC) Continue pradaxa as ordered.   Paroxysmal atrial fibrillation ([place]) Planning to start tykosyn w Dr Rayann Heman. She may need long term anticoag, will need to discuss with cardiology as we go forward.   COPD (chronic obstructive pulmonary disease) ([place]) Newly dx by spirometry. Will trial Spiriva respimat to see if she benefits. Discussed potential side effects w her especially labile control of her A fib rate  Abnormal CT of the chest As discussed previously she needs FOB with BAL and TBBxs. Will plan to do this once she is stabilized from A Fib standpoint. Will coordinate once she is on tykosyn.     Baltazar Apo, MD, PhD 01/03/2016, 11:33 AM [facility] [phone] or if no answer [phone]

## 2016-01-03 NOTE — Assessment & Plan Note (Signed)
Newly dx by spirometry. Will trial Spiriva respimat to see if she benefits. Discussed potential side effects w her especially labile control of her A fib rate

## 2016-01-04 ENCOUNTER — Encounter: Payer: Self-pay | Admitting: Interventional Cardiology

## 2016-01-04 ENCOUNTER — Telehealth: Payer: Self-pay | Admitting: Pharmacist

## 2016-01-04 NOTE — Progress Notes (Signed)
Yes we should get her on Tikosyn, cardiovert, and okay to pause anticoagulation for biopsy whenever needed or scheduled.

## 2016-01-04 NOTE — Telephone Encounter (Signed)
Spoke to patient about Tikosyn. Appt made for pre visit.   She stopped fluoxetine last week in preparation for Tikosyn initiation. She is aware of the risk/benefits with Tikosyn. Other meds have been reviewed.  Previous labs have been appropriate for Tikosyn initiation.   She has not missed any of her Pradaxa in the last month. OF NOTE she does have a watchman device and will not need long term anticoagulation for afib.    Discussed that copay will be affordable for her as she has already called about price.

## 2016-01-05 ENCOUNTER — Encounter (HOSPITAL_COMMUNITY): Payer: Self-pay

## 2016-01-07 DIAGNOSIS — E782 Mixed hyperlipidemia: Secondary | ICD-10-CM | POA: Diagnosis not present

## 2016-01-07 DIAGNOSIS — E559 Vitamin D deficiency, unspecified: Secondary | ICD-10-CM | POA: Diagnosis not present

## 2016-01-07 DIAGNOSIS — I2699 Other pulmonary embolism without acute cor pulmonale: Secondary | ICD-10-CM | POA: Diagnosis not present

## 2016-01-07 DIAGNOSIS — I10 Essential (primary) hypertension: Secondary | ICD-10-CM | POA: Diagnosis not present

## 2016-01-07 DIAGNOSIS — I480 Paroxysmal atrial fibrillation: Secondary | ICD-10-CM | POA: Diagnosis not present

## 2016-01-10 DIAGNOSIS — Z1231 Encounter for screening mammogram for malignant neoplasm of breast: Secondary | ICD-10-CM | POA: Diagnosis not present

## 2016-01-11 ENCOUNTER — Encounter: Payer: Self-pay | Admitting: Pharmacist

## 2016-01-11 ENCOUNTER — Observation Stay (HOSPITAL_COMMUNITY)
Admission: AD | Admit: 2016-01-11 | Discharge: 2016-01-11 | Disposition: A | Discharge status: Home / Self Care | Payer: Medicare Other | Source: Ambulatory Visit | Attending: Internal Medicine | Admitting: Internal Medicine

## 2016-01-11 ENCOUNTER — Ambulatory Visit (INDEPENDENT_AMBULATORY_CARE_PROVIDER_SITE_OTHER): Payer: Medicare Other | Admitting: Pharmacist

## 2016-01-11 ENCOUNTER — Telehealth: Payer: Self-pay | Admitting: Emergency Medicine

## 2016-01-11 VITALS — Wt 166.6 lb

## 2016-01-11 DIAGNOSIS — Z79899 Other long term (current) drug therapy: Secondary | ICD-10-CM | POA: Insufficient documentation

## 2016-01-11 DIAGNOSIS — I480 Paroxysmal atrial fibrillation: Secondary | ICD-10-CM

## 2016-01-11 DIAGNOSIS — I483 Typical atrial flutter: Secondary | ICD-10-CM | POA: Insufficient documentation

## 2016-01-11 DIAGNOSIS — Z86711 Personal history of pulmonary embolism: Secondary | ICD-10-CM | POA: Insufficient documentation

## 2016-01-11 DIAGNOSIS — R918 Other nonspecific abnormal finding of lung field: Secondary | ICD-10-CM

## 2016-01-11 DIAGNOSIS — Z7902 Long term (current) use of antithrombotics/antiplatelets: Secondary | ICD-10-CM | POA: Insufficient documentation

## 2016-01-11 DIAGNOSIS — I2510 Atherosclerotic heart disease of native coronary artery without angina pectoris: Secondary | ICD-10-CM | POA: Insufficient documentation

## 2016-01-11 DIAGNOSIS — I4891 Unspecified atrial fibrillation: Principal | ICD-10-CM | POA: Insufficient documentation

## 2016-01-11 LAB — MAGNESIUM: MAGNESIUM: 1.9 mg/dL (ref 1.7–2.4)

## 2016-01-11 LAB — BASIC METABOLIC PANEL
ANION GAP: 9 (ref 5–15)
BUN: 21 mg/dL — ABNORMAL HIGH (ref 6–20)
CALCIUM: 9 mg/dL (ref 8.9–10.3)
CO2: 25 mmol/L (ref 22–32)
Chloride: 101 mmol/L (ref 101–111)
Creatinine, Ser: 1.21 mg/dL — ABNORMAL HIGH (ref 0.44–1.00)
GFR calc Af Amer: 49 mL/min — ABNORMAL LOW (ref >60)
GFR, EST NON AFRICAN AMERICAN: 42 mL/min — AB (ref >60)
GLUCOSE: 96 mg/dL (ref 65–99)
POTASSIUM: 4.2 mmol/L (ref 3.5–5.1)
SODIUM: 135 mmol/L (ref 135–145)

## 2016-01-11 NOTE — Progress Notes (Signed)
Patient ID: Judy White                 DOB: 01-26-1941                    MRN: [id number]     HPI: Judy White is a 75 y.o. female patient of Dr. Tamala Julian who presents today for Tikosyn initiation. She has been seeing Roderic Palau, NP for atrial fibrillation. She had been on amiodarone but it was d/c'ed due to lung toxicity concerns. She has had a Watchman placed, 11/16, but developed a PE in August of this year and is now on Pradaxa, no bleeding issues. She was recently seen by pulmonary 2/2 abnormal CT. Dr. Lamonte Sakai oked her to proceed with Tikosyn load at this time. She has discontinued her oxygen as she is doing well without it. She is eager to get started with Tikosyn therapy.   Pt educated on potential risks with Tikosyn including QTc prolongation. Pt is aware of the importance of not missing any doses and will call the office if they miss more than 2 doses in a row. Pt is anticoagulated with Pradaxa and reports no missed doses within the past month. Pt is currently not taking any QTc prolongating or contraindicated medications.   She reports that insurance will prefer Eliquis and she is interested in making a change after her hospitalization.    EKG: Atrial flutterwith 2:1 AV conduction, low voltage QRS, T wave abnormality, consider inferior ischemia/anterolateral ischemia Vent Rate: 120 bpm, QTc 472 ms, Previous QTc 414 - in sinus - OK per Dr. Curt Bears to proceed  Labs: Scr 1.21  K 4.2  Mg 1.9  Crcl 56 mL/min  Past Medical History:  Diagnosis Date  . Agoraphobia   . Anxiety   . Arthritis    "normal for the age; nothing gives me problems" (10/08/2015)  . COPD (chronic obstructive pulmonary disease) ([place])    "mild" (10/08/2015)  . Depressive disorder, not elsewhere classified   . Hypercholesteremia   . Hypertension   . Myalgia and myositis, unspecified   . Osteopenia   . Overactive bladder   . Paroxysmal atrial fibrillation (HCC)    a. s/p Watchman implant 12/2014  . Pneumonia X 1      Current Outpatient Prescriptions on File Prior to Visit  Medication Sig Dispense Refill  . Ascorbic Acid (VITAMIN C PO) Take 500 mg by mouth daily.     . bisoprolol (ZEBETA) 5 MG tablet Take 0.5 tablets (2.5 mg total) by mouth daily. 45 tablet 3  . Calcium-Vitamin D (CALTRATE 600 PLUS-VIT D PO) Take 1 tablet by mouth daily.     . cholecalciferol (VITAMIN D) 1000 UNITS tablet Take 1,000 Units by mouth daily.    . clonazePAM (KLONOPIN) 1 MG tablet Take 1.5-2 tablets by mouth daily.     . Cyanocobalamin (VITAMIN B-12 PO) Take 1 tablet by mouth daily.     . dabigatran (PRADAXA) 150 MG CAPS capsule Take 1 capsule (150 mg total) by mouth 2 (two) times daily. 180 capsule 3  . furosemide (LASIX) 20 MG tablet TAKE 1 TABLET ONCE DAILY. 90 tablet 3  . Omega-3 Fatty Acids (FISH OIL PO) Take 1 tablet by mouth daily.     Marland Kitchen oxybutynin (DITROPAN-XL) 10 MG 24 hr tablet Take 1 tablet by mouth daily.    . Potassium 99 MG TABS Take 1 tablet by mouth daily.     No current facility-administered medications on file prior  to visit.     No Known Allergies  Assessment/Plan:  1. Atrial fibrillation - EKG reviewed by Dr. Curt Bears and pt ok to proceed for Tikosyn load. BMET showed K 4.2, Mag 1.9, Scr 1.21, with Crcl 75mL/min. Appropriate for admission.  Anticipate starting dose of 23mcg BID. Spoke with pt and pt's husband. Pt aware to report to admitting.    Thank you, Lelan Pons. Patterson Hammersmith, [place]  [id number], [place], [place] [address]  Phone: [phone]; Fax: [phone] 01/11/2016 8:53 AM

## 2016-01-11 NOTE — Telephone Encounter (Signed)
Dr. Lamonte Sakai   I returned a call about this pt. And she wanted to know if there was any way she could get a hosiptal follow up appointment with you before the end of the month. That was a suggestions from her hospital visit to discuss getting a biopsy.  Please advise

## 2016-01-11 NOTE — H&P (Signed)
Patient being discharged ,unable to do medical history

## 2016-01-11 NOTE — Progress Notes (Signed)
Patient, husband and son upset over call to come to hospital and discharged without any intervention.  Patient was concerned that she came to the hospital and had nothing done. NP Amber and Dr. Rayann Heman spoke with patient. Family disappointed in service and would like to have situation addressed. I will document concerns. Judy Emerald, RN

## 2016-01-11 NOTE — Progress Notes (Signed)
Pt admitted for Tikosyn load. Unfortunately, her amiodarone level is 1 and needs to be <0.3 for Tikosyn initiation. With CAD and sinus bradycardia, she has no other AAD options.  I have discussed with patient extensively today.  She is stable with her atrial fibrillation symptoms. I have offered her pacemaker implant with Sotalol load but she is clear that she would like to avoid pacemaker implantation at this time.   She is most concerned about her needed lung biopsy.  I have discussed with Dr Lamonte Sakai today.  From a cardiology standpoint, she is stable for lung biopsy.  She does not need long term anticoagulation for her atrial fibrillation since she is status post Watchman.  Current anticoagulation is for PE and duration is up to Dr Lamonte Sakai.  Dr Lamonte Sakai will work on setting up lung biopsy.  Patient aware.   Will discharge today with planned outpatient follow up with me on 02/04/16. Will repeat Amiodarone level at that time  [name], NP 01/11/2016 2:44 PM  I have seen, examined the patient, and reviewed the above assessment and plan.  On exam, iRRR.  Changes to above are made where necessary.  Typical appearing atrial flutter as well as afib previously noted.  Will proceed with lung biopsy and then determine next step from EP standpoint.  Co Sign: Thompson Grayer, MD 01/11/2016 3:11 PM

## 2016-01-11 NOTE — Progress Notes (Signed)
01/11/16 16:30 Called Dr. Agustina Caroli office to make appointment for pt.  Earliest appointment would be 02/11/2016. Requested that office call patient and schedule appointment with NP or PA? Earlier. Called patient and made aware that someone should call her tomorrow. Patient skeptical but will wait. Payton Emerald, RN

## 2016-01-11 NOTE — Patient Instructions (Signed)
We will call you with lab results and when to head over to the hospital.

## 2016-01-11 NOTE — Progress Notes (Addendum)
NP Carlynn Herald aware patient is awaiting orders.  MD will come and speak to patient per Amber.  12:45Pt is a direct admission.  Pt placed on telemetry and vital signs taken.  Pt oriented to unit and patient guide given. Pt aware that orders need to be placed in system.  Pt given plan of care and educated on tikosyn loading. Pt currently afib/flutter 85-120's.  Patient drinking [employer] coffee. Pt educated on caffeine. Pt resting with call bell within reach.  Will continue to monitor. Payton Emerald, RN

## 2016-01-12 ENCOUNTER — Encounter: Payer: Self-pay | Admitting: Nurse Practitioner

## 2016-01-12 ENCOUNTER — Encounter: Payer: Self-pay | Admitting: Emergency Medicine

## 2016-01-12 ENCOUNTER — Telehealth: Payer: Self-pay | Admitting: Pharmacist

## 2016-01-12 NOTE — Telephone Encounter (Signed)
Spoke to patient and she states that she is upset with the situation, not any one person. She reports that she is wanting to see the labs because she was told that her amio level yesterday was the exact same as it was over a month ago. Informed her that we did not draw an amio level yesterday, only a BMET and mag level. She stated that she was sure that was the case because there was no way she could have been off the medication that long without the level changing even a little bit. We discussed that the half life of the drug is very long and though we did not have a level yesterday it likely was not below the recommended level to start Tikosyn, though it should be slightly lower than the level last month since she has been off medication. She states her son questioned the providers on this and were told the level was the same. I again reiterated that the long half-life meant the drug would likely still be at a level too high for Tikosyn initiation. She states she understands and understands the need for this level to be down for safe initiation of Tikosyn.   She states she was not pleased with the care she received at the Afib clinic and she is unsure how to proceed as she is feeling very poorly with her heart racing. She reports she told Amber that she didn't feel as if she was going to die, but would like to feel better soon so she can have her lung biopsy. She also states that the Afib is just "taking it out of her". She reports that Dr. Lamonte Sakai would like to wait until Afib under control to move forward with biopsy. She just feels as if the system has failed her.   I apologized and asked how to best serve her moving forward. She stated that she planned to come on Dec 7th, 2017 to have blood checked. She is wondering how quickly after this she might be appropriate for readmission. We discussed that it would take several days for the lab to result and depend on the amio level.   She states she would like to  move forward with the labs and would like to see the labs from yesterday. I will have these released to mychart for her to see. She was given my direct line to call with any other issues or concerns.   She states she understands and appreciated the phone call.

## 2016-01-12 NOTE — Telephone Encounter (Signed)
LMTCB

## 2016-01-12 NOTE — Telephone Encounter (Signed)
Please let her know that I spoke with Dr Rayann Heman about her case. We believe that we can proceed with her bronchoscopy, should not have to wait to change her Atrial fib medication. Please try to schedule her for FOB with fluoro, transbronchial biopsies. No TB risk. Note we will have to stop her anticoagulation 2 days prior to procedure. Thanks.

## 2016-01-13 NOTE — Telephone Encounter (Signed)
Pt emailed about this and I let her know okay to set this up  What dates could this be done  Please advise and we will call to schedule, thanks

## 2016-01-14 NOTE — Telephone Encounter (Signed)
Spoke with RB. There is some confusion. Pt only needs to have a regular bronch. I will get this scheduled. Bronch has been scheduled for 01/24/16 at 8am at [facility]. Pt is aware.  Nothing further was needed.

## 2016-01-14 NOTE — Telephone Encounter (Signed)
I spoke with Pt who agrees to proceed with this procedure.  Pt also states she was told that you were in charge of her blood thinners. Pt is currently on Pradaxa but would like to change to eliquis if possible. Order for ENB has been placed.   Ria Comment is aware to request super D RB please advise.

## 2016-01-17 ENCOUNTER — Telehealth: Payer: Self-pay | Admitting: Emergency Medicine

## 2016-01-17 NOTE — Telephone Encounter (Signed)
Called and spoke to pt. Pt wanting to confirm her bronch appt at [facility] on 11.27.17. Appt time and date confirmed. Pt verbalized understanding and denied any further questions or concerns at this time.

## 2016-01-18 ENCOUNTER — Encounter: Payer: Self-pay | Admitting: Interventional Cardiology

## 2016-01-19 ENCOUNTER — Encounter: Payer: Self-pay | Admitting: Interventional Cardiology

## 2016-01-19 ENCOUNTER — Ambulatory Visit (INDEPENDENT_AMBULATORY_CARE_PROVIDER_SITE_OTHER): Payer: Medicare Other | Admitting: Interventional Cardiology

## 2016-01-19 ENCOUNTER — Telehealth: Payer: Self-pay | Admitting: *Deleted

## 2016-01-19 VITALS — BP 122/66 | HR 53 | Ht 66.0 in | Wt 178.0 lb

## 2016-01-19 DIAGNOSIS — J42 Unspecified chronic bronchitis: Secondary | ICD-10-CM

## 2016-01-19 DIAGNOSIS — I483 Typical atrial flutter: Secondary | ICD-10-CM | POA: Diagnosis not present

## 2016-01-19 DIAGNOSIS — I480 Paroxysmal atrial fibrillation: Secondary | ICD-10-CM | POA: Diagnosis not present

## 2016-01-19 DIAGNOSIS — I2782 Chronic pulmonary embolism: Secondary | ICD-10-CM

## 2016-01-19 DIAGNOSIS — I6523 Occlusion and stenosis of bilateral carotid arteries: Secondary | ICD-10-CM

## 2016-01-19 DIAGNOSIS — I5032 Chronic diastolic (congestive) heart failure: Secondary | ICD-10-CM | POA: Diagnosis not present

## 2016-01-19 DIAGNOSIS — J849 Interstitial pulmonary disease, unspecified: Secondary | ICD-10-CM

## 2016-01-19 MED ORDER — APIXABAN 5 MG PO TABS: 5.0000 mg | ORAL_TABLET | Freq: Two times a day (BID) | ORAL | 3 refills | Status: DC

## 2016-01-19 NOTE — Progress Notes (Signed)
Cardiology Office Note    Date:  01/19/2016   ID:  Judy White, DOB January 12, 1941, MRN [id number]  PCP:  Henrine Screws, MD  Cardiologist: Sinclair Grooms, MD   Chief Complaint  Patient presents with  . Atrial Fibrillation    History of Present Illness:  Judy White is a 75 y.o. female who presents for paroxysmal atrial fibrillation, amiodarone therapy, diastolic heart failure, unable to use anticoagulation due to intracranial hemorrhage, essential hypertension,and anxiety disorder.  She is still upset because of confusion concerning management of atrial fibrillation. She did undergo successful Watchman left atrial occlusion. She has been managed predominantly bound atrial fibrillation team including the clinic. Amiodarone was discontinued because of pulmonary toxicity. The decision was made that she might benefit from [place] [facility]. She was set up to have loading but after being sent to the hospital it was discovered that she had not been off of amiodarone long enough. The hospitalization was canceled. She became very upset. She discharged Dr. Rayann Heman from her care. Additionally she was found to have a lesion in her lung that needs to be evaluated.  She is upset with me today because I am "not available when she needs me". She is accompanied by her husband and son. She is doing well today. She has brought in information from an oral surgeon who raises the question of bilateral carotid calcifications based upon x-rays performed on her teeth. She has prior history of stroke.    Past Medical History:  Diagnosis Date  . Agoraphobia   . Anxiety   . Arthritis    "normal for the age; nothing gives me problems" (10/08/2015)  . COPD (chronic obstructive pulmonary disease) ([place])    "mild" (10/08/2015)  . Depressive disorder, not elsewhere classified   . Hypercholesteremia   . Hypertension   . Myalgia and myositis, unspecified   . Osteopenia   . Overactive bladder   . Paroxysmal atrial  fibrillation (HCC)    a. s/p Watchman implant 12/2014  . Pneumonia X 1    Past Surgical History:  Procedure Laterality Date  . APPENDECTOMY  1983  . BACK SURGERY    . CARDIAC CATHETERIZATION     >5 yrs  . CATARACT EXTRACTION W/ INTRAOCULAR LENS  IMPLANT, BILATERAL Bilateral   . CESAREAN SECTION  1967  . LEFT ATRIAL APPENDAGE OCCLUSION N/A 12/31/2014   Procedure: LEFT ATRIAL APPENDAGE OCCLUSION;  Surgeon: Thompson Grayer, MD;  Location: [facility];  Service: Cardiovascular;  Laterality: N/A;  . POSTERIOR FUSION LUMBAR SPINE Bilateral 07/2014  . SHOULDER ARTHROSCOPY W/ ROTATOR CUFF REPAIR Left   . SHOULDER SURGERY Right    "tendon broke; couldn't be repaired"  . TEE WITHOUT CARDIOVERSION N/A 12/22/2014   Procedure: TRANSESOPHAGEAL ECHOCARDIOGRAM (TEE);  Surgeon: Pixie Casino, MD;  Location: [facility];  Service: Cardiovascular;  Laterality: N/A;  . TEE WITHOUT CARDIOVERSION N/A 02/16/2015   post Watchman TEE with good seal and no leak around device   . TONSILLECTOMY    . TOTAL ABDOMINAL HYSTERECTOMY    . WISDOM TOOTH EXTRACTION      Current Medications: Outpatient Medications Prior to Visit  Medication Sig Dispense Refill  . acetaminophen (TYLENOL) 500 MG tablet Take 500 mg by mouth every 6 (six) hours as needed for moderate pain or headache.    . Ascorbic Acid (VITAMIN C PO) Take 500 mg by mouth daily.     . bisoprolol (ZEBETA) 5 MG tablet Take 0.5 tablets (2.5 mg total) by  mouth daily. 45 tablet 3  . Calcium-Vitamin D (CALTRATE 600 PLUS-VIT D PO) Take 1 tablet by mouth daily.     . cholecalciferol (VITAMIN D) 1000 UNITS tablet Take 1,000 Units by mouth daily.    . clonazePAM (KLONOPIN) 1 MG tablet Take 1.5-2 mg by mouth daily.     . Cyanocobalamin (VITAMIN B-12 PO) Take 1 tablet by mouth daily.     . furosemide (LASIX) 20 MG tablet TAKE 1 TABLET ONCE DAILY. (Patient taking differently: TAKE 20 MG by mouth ONCE DAILY.) 90 tablet 3  . Omega-3 Fatty Acids (FISH OIL PO)  Take 1 tablet by mouth daily.     Marland Kitchen oxybutynin (DITROPAN-XL) 10 MG 24 hr tablet Take 10 mg by mouth daily.     . Potassium 99 MG TABS Take 99 mg by mouth daily.     . Tiotropium Bromide Monohydrate (SPIRIVA RESPIMAT) 1.25 MCG/ACT AERS Inhale 2 puffs into the lungs daily.     . dabigatran (PRADAXA) 150 MG CAPS capsule Take 1 capsule (150 mg total) by mouth 2 (two) times daily. 180 capsule 3   No facility-administered medications prior to visit.      Allergies:   Patient has no known allergies.   Social History   Social History  . Marital status: Married    Spouse name: N/A  . Number of children: N/A  . Years of education: N/A   Social History Main Topics  . Smoking status: Former Smoker    Packs/day: 0.50    Years: 60.00    Types: Cigarettes    Quit date: 10/08/2015  . Smokeless tobacco: Never Used  . Alcohol use 0.0 oz/week     Comment: 10/08/2015 "don't drink at all right now; social drinker"  . Drug use: No  . Sexual activity: No   Other Topics Concern  . None   Social History Narrative  . None     Family History:  The patient's family history includes Aneurysm in her father; Heart disease in her brother; Hypotension in her mother; Neuropathy in her brother.   ROS:   Please see the history of present illness.    She has occasional chest pain. His shortness of breath with activity especially when her heart is out of rhythm.   She has headaches. She has easy bruising.  All other systems reviewed and are negative.   PHYSICAL EXAM:   VS:  BP 122/66   Pulse (!) 53   Ht 5\' 6"  (1.676 m)   Wt 178 lb (80.7 kg)   BMI 28.73 kg/m    GEN: Well nourished, well developed, in no acute distress  HEENT: normal  Neck: no JVD, or masses. There is a faint left carotid bruit. Cardiac: RRR; no murmurs, rubs, or gallops,no edema  Respiratory:  clear to auscultation bilaterally, normal work of breathing GI: soft, nontender, nondistended, + BS MS: no deformity or atrophy  Skin: warm  and dry, no rash Neuro:  Alert and Oriented x 3, Strength and sensation are intact Psych: euthymic mood, full affect  Wt Readings from Last 3 Encounters:  01/19/16 178 lb (80.7 kg)  01/11/16 166 lb 0.1 oz (75.3 kg)  01/11/16 166 lb 9.6 oz (75.6 kg)      Studies/Labs Reviewed:   EKG:  EKG  Not performed today but clinically the patient is in normal sinus rhythm.  Recent Labs: 12/02/2015: ALT 13; B Natriuretic Peptide 375.6 12/03/2015: Hemoglobin 12.7; Platelets 285; TSH 3.313 01/11/2016: BUN 21; Creatinine, Ser 1.21;  Magnesium 1.9; Potassium 4.2; Sodium 135   Lipid Panel No results found for: CHOL, TRIG, HDL, CHOLHDL, VLDL, LDLCALC, LDLDIRECT  Additional studies/ records that were reviewed today include:  I reviewed panoramic x-rays of the teeth and evidence of carotid area calcification noted on the images.    ASSESSMENT:    1. Typical atrial flutter (HCC)   2. Paroxysmal atrial fibrillation (HCC)   3. Other chronic pulmonary embolism without acute cor pulmonale ([place])   4. Chronic diastolic heart failure ([place])   5. Interstitial lung disease ([place])   6. Chronic bronchitis, unspecified chronic bronchitis type ([place])   7. Calcification of both carotid arteries      PLAN:  In order of problems listed above:  1. Basic upon clinical exam today the patient is in sinus rhythm. Heart rate is in the low 60s and regular.She no longer wants Dr. Rayann Heman involved in her care. I will refer her to Dr. Curt Bears. Hopefully when she is done with pulmonary evaluation, she will be eligible for Tikosyn loading and long-term follow-up. I do believe it is important to fight for rhythm control and her since she feels so much better when in sinus rhythm 2. This has been a paroxysmal problem over time. Currently in sinus rhythm as noted above. Tikosyn loading and chronic therapy will help suppress recurrences. Dr. Curt Bears.  has been recommended as her new EP physician. 3. On anticoagulation therapy and  stable without bleeding complications. 4. No evidence of volume overload. 5. Chronic lung disease. Possible aggravation by amiodarone. This medication was discontinued. 7.   With evidence of carotid calcification by x-ray and a faint bruit, carotid Doppler will be performed to rule out significant obstructive disease.  This is an extended office visit. Greater than 50% of the time was spent in question and answer and had a do with the patient's dissatisfaction with our practice.  Medication Adjustments/Labs and Tests Ordered: Current medicines are reviewed at length with the patient today.  Concerns regarding medicines are outlined above.  Medication changes, Labs and Tests ordered today are listed in the Patient Instructions below. Patient Instructions  Medication Instructions:  1) START Eliquis 5mg  twice daily.  Labwork: None  Testing/Procedures: Your physician has requested that you have a carotid duplex. This test is an ultrasound of the carotid arteries in your neck. It looks at blood flow through these arteries that supply the brain with blood. Allow one hour for this exam. There are no restrictions or special instructions.    Follow-Up: Your physician recommends that you schedule a follow-up appointment next available with Dr. Curt Bears for management of Afib and Tikosyn loading.  Your physician wants you to follow-up in: 6 months with Dr. Tamala Julian. You will receive a reminder letter in the mail two months in advance. If you don't receive a letter, please call our office to schedule the follow-up appointment.    Any Other Special Instructions Will Be Listed Below (If Applicable).     If you need a refill on your cardiac medications before your next appointment, please call your pharmacy.      Signed, Sinclair Grooms, MD  01/19/2016 2:06 PM    [facility] [place], [place], [place]  [address] Phone: [phone]; Fax: [phone]

## 2016-01-19 NOTE — Telephone Encounter (Signed)
Pt in to see Dr. Tamala Julian today.  These items will be addressed.

## 2016-01-19 NOTE — Patient Instructions (Addendum)
Medication Instructions:  1) START Eliquis 5mg  twice daily.  Labwork: None  Testing/Procedures: Your physician has requested that you have a carotid duplex. This test is an ultrasound of the carotid arteries in your neck. It looks at blood flow through these arteries that supply the brain with blood. Allow one hour for this exam. There are no restrictions or special instructions.    Follow-Up: Your physician recommends that you schedule a follow-up appointment next available with Dr. Curt Bears for management of Afib and Tikosyn loading.  Your physician wants you to follow-up in: 6 months with Dr. Tamala Julian. You will receive a reminder letter in the mail two months in advance. If you don't receive a letter, please call our office to schedule the follow-up appointment.    Any Other Special Instructions Will Be Listed Below (If Applicable).     If you need a refill on your cardiac medications before your next appointment, please call your pharmacy.

## 2016-01-24 ENCOUNTER — Inpatient Hospital Stay (HOSPITAL_COMMUNITY)
Admission: RE | Admit: 2016-01-24 | Discharge: 2016-01-28 | DRG: 166 | Disposition: A | Discharge status: Home / Self Care | Payer: Medicare Other | Source: Ambulatory Visit | Attending: Emergency Medicine | Admitting: Emergency Medicine

## 2016-01-24 ENCOUNTER — Encounter (HOSPITAL_COMMUNITY)
Admission: RE | Disposition: A | Discharge status: Home / Self Care | Payer: Self-pay | Source: Ambulatory Visit | Attending: Emergency Medicine

## 2016-01-24 ENCOUNTER — Telehealth: Payer: Self-pay

## 2016-01-24 ENCOUNTER — Ambulatory Visit (HOSPITAL_COMMUNITY)
Admission: RE | Admit: 2016-01-24 | Discharge: 2016-01-24 | Disposition: A | Discharge status: Home / Self Care | Payer: Medicare Other | Source: Ambulatory Visit | Attending: Emergency Medicine | Admitting: Emergency Medicine

## 2016-01-24 ENCOUNTER — Ambulatory Visit (HOSPITAL_COMMUNITY): Payer: Medicare Other

## 2016-01-24 ENCOUNTER — Encounter (HOSPITAL_COMMUNITY): Payer: Self-pay | Admitting: Respiratory Therapy

## 2016-01-24 ENCOUNTER — Observation Stay (HOSPITAL_COMMUNITY): Payer: Medicare Other

## 2016-01-24 DIAGNOSIS — R918 Other nonspecific abnormal finding of lung field: Secondary | ICD-10-CM | POA: Diagnosis not present

## 2016-01-24 DIAGNOSIS — J449 Chronic obstructive pulmonary disease, unspecified: Secondary | ICD-10-CM | POA: Diagnosis present

## 2016-01-24 DIAGNOSIS — Z9889 Other specified postprocedural states: Secondary | ICD-10-CM

## 2016-01-24 DIAGNOSIS — Z8249 Family history of ischemic heart disease and other diseases of the circulatory system: Secondary | ICD-10-CM

## 2016-01-24 DIAGNOSIS — E7800 Pure hypercholesterolemia, unspecified: Secondary | ICD-10-CM | POA: Diagnosis present

## 2016-01-24 DIAGNOSIS — N3281 Overactive bladder: Secondary | ICD-10-CM | POA: Diagnosis present

## 2016-01-24 DIAGNOSIS — J95811 Postprocedural pneumothorax: Secondary | ICD-10-CM

## 2016-01-24 DIAGNOSIS — Z95818 Presence of other cardiac implants and grafts: Secondary | ICD-10-CM

## 2016-01-24 DIAGNOSIS — Z86711 Personal history of pulmonary embolism: Secondary | ICD-10-CM

## 2016-01-24 DIAGNOSIS — J189 Pneumonia, unspecified organism: Secondary | ICD-10-CM | POA: Diagnosis not present

## 2016-01-24 DIAGNOSIS — I480 Paroxysmal atrial fibrillation: Secondary | ICD-10-CM | POA: Diagnosis present

## 2016-01-24 DIAGNOSIS — I10 Essential (primary) hypertension: Secondary | ICD-10-CM | POA: Diagnosis present

## 2016-01-24 DIAGNOSIS — J939 Pneumothorax, unspecified: Secondary | ICD-10-CM | POA: Diagnosis not present

## 2016-01-24 DIAGNOSIS — Z7901 Long term (current) use of anticoagulants: Secondary | ICD-10-CM

## 2016-01-24 DIAGNOSIS — Z938 Other artificial opening status: Secondary | ICD-10-CM

## 2016-01-24 DIAGNOSIS — F17200 Nicotine dependence, unspecified, uncomplicated: Secondary | ICD-10-CM | POA: Diagnosis present

## 2016-01-24 DIAGNOSIS — R001 Bradycardia, unspecified: Secondary | ICD-10-CM | POA: Diagnosis present

## 2016-01-24 DIAGNOSIS — M1990 Unspecified osteoarthritis, unspecified site: Secondary | ICD-10-CM | POA: Diagnosis present

## 2016-01-24 DIAGNOSIS — I2699 Other pulmonary embolism without acute cor pulmonale: Secondary | ICD-10-CM | POA: Diagnosis present

## 2016-01-24 DIAGNOSIS — J9600 Acute respiratory failure, unspecified whether with hypoxia or hypercapnia: Secondary | ICD-10-CM

## 2016-01-24 DIAGNOSIS — I82401 Acute embolism and thrombosis of unspecified deep veins of right lower extremity: Secondary | ICD-10-CM | POA: Diagnosis present

## 2016-01-24 DIAGNOSIS — F329 Major depressive disorder, single episode, unspecified: Secondary | ICD-10-CM | POA: Diagnosis present

## 2016-01-24 DIAGNOSIS — F419 Anxiety disorder, unspecified: Secondary | ICD-10-CM | POA: Diagnosis present

## 2016-01-24 DIAGNOSIS — B965 Pseudomonas (aeruginosa) (mallei) (pseudomallei) as the cause of diseases classified elsewhere: Secondary | ICD-10-CM | POA: Diagnosis present

## 2016-01-24 DIAGNOSIS — F4000 Agoraphobia, unspecified: Secondary | ICD-10-CM | POA: Diagnosis present

## 2016-01-24 DIAGNOSIS — M8580 Other specified disorders of bone density and structure, unspecified site: Secondary | ICD-10-CM | POA: Diagnosis present

## 2016-01-24 DIAGNOSIS — Z79899 Other long term (current) drug therapy: Secondary | ICD-10-CM

## 2016-01-24 DIAGNOSIS — M609 Myositis, unspecified: Secondary | ICD-10-CM | POA: Diagnosis present

## 2016-01-24 HISTORY — DX: Personal history of other diseases of the digestive system: Z87.19

## 2016-01-24 HISTORY — PX: VIDEO BRONCHOSCOPY: SHX5072

## 2016-01-24 HISTORY — PX: BRONCHOSCOPY: SUR163

## 2016-01-24 HISTORY — DX: Other pulmonary embolism without acute cor pulmonale: I26.99

## 2016-01-24 HISTORY — DX: Acute embolism and thrombosis of unspecified deep veins of unspecified lower extremity: I82.409

## 2016-01-24 LAB — BODY FLUID CELL COUNT WITH DIFFERENTIAL
EOS FL: 2 %
Lymphs, Fluid: 3 %
MONOCYTE-MACROPHAGE-SEROUS FLUID: 3 % — AB (ref 50–90)
Neutrophil Count, Fluid: 92 % — ABNORMAL HIGH (ref 0–25)
Total Nucleated Cell Count, Fluid: 900 cu mm (ref 0–1000)

## 2016-01-24 SURGERY — BRONCHOSCOPY, WITH FLUOROSCOPY
Anesthesia: Moderate Sedation | Laterality: Bilateral

## 2016-01-24 MED ORDER — PHENYLEPHRINE HCL 0.25 % NA SOLN
NASAL | Status: DC | PRN
  Administered 2016-01-24: 2 via NASAL

## 2016-01-24 MED ORDER — FENTANYL CITRATE (PF) 100 MCG/2ML IJ SOLN
INTRAMUSCULAR | Status: DC | PRN
  Administered 2016-01-24: 50 ug via INTRAVENOUS
  Administered 2016-01-24 (×2): 25 ug via INTRAVENOUS

## 2016-01-24 MED ORDER — POTASSIUM 99 MG PO TABS: 99.0000 mg | ORAL_TABLET | Freq: Every day | ORAL | Status: DC

## 2016-01-24 MED ORDER — ACETAMINOPHEN 325 MG PO TABS
650.0000 mg | ORAL_TABLET | Freq: Four times a day (QID) | ORAL | Status: DC | PRN
Start: 2016-01-24 — End: 2016-01-28
  Administered 2016-01-27 – 2016-01-28 (×2): 650 mg via ORAL
  Filled 2016-01-24 (×4): qty 2

## 2016-01-24 MED ORDER — FUROSEMIDE 20 MG PO TABS
20.0000 mg | ORAL_TABLET | Freq: Every day | ORAL | Status: DC
  Administered 2016-01-24 – 2016-01-28 (×5): 20 mg via ORAL
  Filled 2016-01-24 (×5): qty 1

## 2016-01-24 MED ORDER — BISOPROLOL FUMARATE 5 MG PO TABS
2.5000 mg | ORAL_TABLET | Freq: Every day | ORAL | Status: DC
  Administered 2016-01-25 – 2016-01-27 (×3): 2.5 mg via ORAL
  Filled 2016-01-24 (×3): qty 1

## 2016-01-24 MED ORDER — OXYBUTYNIN CHLORIDE ER 10 MG PO TB24
10.0000 mg | ORAL_TABLET | Freq: Every day | ORAL | Status: DC
  Administered 2016-01-24 – 2016-01-28 (×5): 10 mg via ORAL
  Filled 2016-01-24 (×5): qty 1

## 2016-01-24 MED ORDER — SODIUM CHLORIDE 0.9 % IV SOLN
INTRAVENOUS | Status: DC
  Administered 2016-01-24: 08:00:00 via INTRAVENOUS

## 2016-01-24 MED ORDER — LIDOCAINE HCL (PF) 1 % IJ SOLN
INTRAMUSCULAR | Status: DC | PRN
Start: 2016-01-24 — End: 2016-01-24
  Administered 2016-01-24: 6 mL

## 2016-01-24 MED ORDER — MIDAZOLAM HCL 5 MG/ML IJ SOLN
INTRAMUSCULAR | Status: AC
  Filled 2016-01-24: qty 2

## 2016-01-24 MED ORDER — CLONAZEPAM 1 MG PO TABS
1.0000 mg | ORAL_TABLET | Freq: Every day | ORAL | Status: DC | PRN
  Administered 2016-01-24 – 2016-01-26 (×4): 1 mg via ORAL
  Filled 2016-01-24 (×4): qty 1

## 2016-01-24 MED ORDER — LIDOCAINE HCL 2 % EX GEL: 1.0000 "application " | Freq: Once | CUTANEOUS | Status: DC

## 2016-01-24 MED ORDER — DABIGATRAN ETEXILATE MESYLATE 150 MG PO CAPS: 150.0000 mg | ORAL_CAPSULE | Freq: Two times a day (BID) | ORAL | Status: DC

## 2016-01-24 MED ORDER — ACETAMINOPHEN 650 MG RE SUPP
650.0000 mg | Freq: Four times a day (QID) | RECTAL | Status: DC | PRN
Start: 2016-01-24 — End: 2016-01-28

## 2016-01-24 MED ORDER — TIOTROPIUM BROMIDE MONOHYDRATE 18 MCG IN CAPS
18.0000 ug | ORAL_CAPSULE | Freq: Every day | RESPIRATORY_TRACT | Status: DC
  Administered 2016-01-25 – 2016-01-28 (×4): 18 ug via RESPIRATORY_TRACT
  Filled 2016-01-24: qty 5

## 2016-01-24 MED ORDER — TIOTROPIUM BROMIDE MONOHYDRATE 1.25 MCG/ACT IN AERS: 2.0000 | INHALATION_SPRAY | Freq: Every day | RESPIRATORY_TRACT | Status: DC

## 2016-01-24 MED ORDER — MIDAZOLAM HCL 10 MG/2ML IJ SOLN
INTRAMUSCULAR | Status: DC | PRN
  Administered 2016-01-24 (×2): 2 mg via INTRAVENOUS
  Administered 2016-01-24: 1 mg via INTRAVENOUS

## 2016-01-24 MED ORDER — ALBUTEROL SULFATE (2.5 MG/3ML) 0.083% IN NEBU: 2.5000 mg | INHALATION_SOLUTION | RESPIRATORY_TRACT | Status: DC | PRN

## 2016-01-24 MED ORDER — FENTANYL CITRATE (PF) 100 MCG/2ML IJ SOLN
INTRAMUSCULAR | Status: AC
Start: 2016-01-24 — End: 2016-01-24
  Filled 2016-01-24: qty 4

## 2016-01-24 MED ORDER — PHENYLEPHRINE HCL 0.25 % NA SOLN: 1.0000 | Freq: Four times a day (QID) | NASAL | Status: DC | PRN

## 2016-01-24 MED ORDER — SODIUM CHLORIDE 0.9% FLUSH
3.0000 mL | Freq: Two times a day (BID) | INTRAVENOUS | Status: DC
  Administered 2016-01-24 – 2016-01-28 (×7): 3 mL via INTRAVENOUS

## 2016-01-24 MED ORDER — LIDOCAINE HCL 2 % EX GEL
CUTANEOUS | Status: DC | PRN
  Administered 2016-01-24: 1

## 2016-01-24 NOTE — H&P (View-Only) (Signed)
History of Present Illness Judy White is a 75 y.o. female current smoker with recent PE started on Pradaxa 10/07/15, COPD, HTN, paroxysmal-A.Fib, patient previously not on blood thinners despite CHADS vasc of 4 due to history of brain bleed. She has a Watchman device in her left atrial appendage.    HPI:75 yo woman with hx A fib and watchman placement, on amiodarone but not anti-coag since 01/2015 due to concern regarding hx of SDH in 2007. Also w hx HTN, tobacco use and COPD. She was admitted with light-headedness, near-syncope 8/11, found to be hypoxemic. CT chest 8/11 showed scattered RLL PE's, some evidence for interstitial edema and GGI. LE doppler study showed RLE DVT. She was been started on therapeutic enoxaparin.and transitioned to Pradaxa at discharge. She was discharged home on oxygen. Amiodarone was stopped. She stopped smoking after the hospitalization.   She reports that she had a hospitalization beginning of October for atrial fibrillation. Her breathing has improved some. She has fatigue, no new resp sx. No new exposures. Not smoking. No mold exposure. No pets / no birds or bats.   She underwent a CT scan of her chest on 12/16/15 that I personally reviewed. This showed changes in the pre-existing groundglass inflammation that had been seen on her August CT scan. Much of this has cleared but now there is significant scattered more consolidated infiltrate and a loose nodular pattern. Most notable was in the lingula 3.9 x 2.2 cm.  Also noted was some possible right hilar lymphadenopathy.    ROV 01/03/16 -- the patient returns for evaluation of her history of pulmonary embolism, abnormal CT scan of the chest, former tobacco use.. As detailed above she has scattered loose nodular disease on CT scan that we will likely need BAL and biopsy. She is currently undergoing evaluation for initiation of Tykosyn and I would like for her to have this done before we performed bronchoscopy. She underwent  pulmonary function testing 12/31/15 and I personally reviewed the results. The spirometry shows mixed obstruction and restriction without a bronchodilator response. Lung volumes are restricted. Diffusion capacity is decreased and does not fully correct when adjusted for alveolar volume.    Tests CT-PA 8/11 >> several RLL pulmonary emboli, evidence for pulmonary vascular congestion with some basilar more confluent GG change LE doppler US 8/12 >> RLE DVT ECHO  10/09/2015>> PAP is 28 mm Hg   Review Of Systems:  Constitutional:   No  weight loss, night sweats,  Fevers, chills, fatigue, or  lassitude.  HEENT:   No headaches,  Difficulty swallowing,  Tooth/dental problems, or  Sore throat,                No sneezing, itching, ear ache, nasal congestion, post nasal drip,   CV:  No chest pain,  Orthopnea, PND, + swelling in lower extremities, anasarca, dizziness, palpitations, syncope.   GI  No heartburn, indigestion, abdominal pain, nausea, vomiting, diarrhea, change in bowel habits, loss of appetite, bloody stools.   Resp: + shortness of breath with exertion less at rest.  No excess mucus, no productive cough,  + non-productive cough,  No coughing up of blood.  No change in color of mucus.  No wheezing.  No chest wall deformity  Skin: no rash or lesions.  GU: no dysuria, change in color of urine, no urgency or frequency.  No flank pain, no hematuria   MS:  No joint pain or swelling.  No decreased range of motion.  + back pain.  Psych:  No change in mood or affect. No depression or anxiety.  No memory loss.   Vital Signs BP 126/84 (BP Location: Left Arm, Cuff Size: Normal)   Pulse (!) 120   Wt 173 lb (78.5 kg)   SpO2 90%   BMI 27.50 kg/m   Vitals:   01/03/16 1103  BP: 126/84  Pulse: (!) 120  SpO2: 90%  Weight: 173 lb (78.5 kg)   Physical Exam:  General- No distress,  A&Ox3, pleasant ENT: No sinus tenderness, TM clear, pale nasal mucosa, no oral exudate,no post nasal drip, no  LAN Cardiac: S1, S2, regular rate and rhythm, no murmur Chest: No wheeze/ + crackles / dullness; no accessory muscle use, no nasal flaring, no sternal retractions Abd.: Soft Non-tender Ext: No clubbing cyanosis, edema Neuro:  normal strength Skin: No rashes, warm and dry Psych: normal mood and behavior   Assessment/Plan  Pulmonary embolism (HCC) Continue pradaxa as ordered.   Paroxysmal atrial fibrillation ([place]) Planning to start tykosyn w Dr Rayann Heman. She may need long term anticoag, will need to discuss with cardiology as we go forward.   COPD (chronic obstructive pulmonary disease) ([place]) Newly dx by spirometry. Will trial Spiriva respimat to see if she benefits. Discussed potential side effects w her especially labile control of her A fib rate  Abnormal CT of the chest As discussed previously she needs FOB with BAL and TBBxs. Will plan to do this once she is stabilized from A Fib standpoint. Will coordinate once she is on tykosyn.     Baltazar Apo, MD, PhD 01/03/2016, 11:33 AM [facility] [phone] or if no answer [phone]

## 2016-01-24 NOTE — Op Note (Signed)
[facility] Cardiopulmonary Patient Name: Judy White Date: 01/24/2016 MRN: [id number] Attending MD: Collene Gobble , MD Date of Birth: 1940-09-04 CSN: Finalized Age: 75 Admit Type: Outpatient Gender: Female Procedure:            Bronchoscopy Indications:          Unresolving bilateral infiltrates Providers:            Collene Gobble, MD, Ashley Mariner RRT,RCP, Cherre Huger                        RRT, RCP Referring MD:          Medicines:            Fentanyl 100 mcg IV, Midazolam 5 mg IV, Lidocaine 1%                        applied to cords 12 mL, Lidocaine 1% applied to the                        tracheobronchial tree 16 mL Complications:        No immediate complications. Estimated blood loss:                        Minimal Estimated Blood Loss: Estimated blood loss was minimal. Procedure:            Pre-Anesthesia Assessment:                       - A History and Physical has been performed. Patient                        meds and allergies have been reviewed. The risks and                        benefits of the procedure and the sedation options and                        risks were discussed with the patient. All questions                        were answered and informed consent was obtained.                        Patient identification and proposed procedure were                        verified prior to the procedure by the physician in the                        procedure room. Mental Status Examination: alert and                        oriented. Airway Examination: normal oropharyngeal                        airway. Respiratory Examination: bibasilar crackles. CV                        Examination: bradycardic but regular, HR 40's Prior  Anticoagulants: The patient has taken Pradaxa                        (dabigatran), last dose was 3.5 days prior to                        procedure. ASA Grade Assessment: II - A patient with                      mild systemic disease. After reviewing the risks and                        benefits, the patient was deemed in satisfactory                        condition to undergo the procedure. The anesthesia plan                        was to use moderate sedation / analgesia (conscious                        sedation). Immediately prior to administration of                        medications, the patient was re-assessed for adequacy                        to receive sedatives. The heart rate, respiratory rate,                        oxygen saturations, blood pressure, adequacy of                        pulmonary ventilation, and response to care were                        monitored throughout the procedure. The physical status                        of the patient was re-assessed after the procedure.                       After obtaining informed consent, the bronchoscope was                        passed under direct vision. Throughout the procedure,                        the patient's blood pressure, pulse, and oxygen                        saturations were monitored continuously. the [id number]                        [id number] scope was introduced through the left nostril                        and advanced to the tracheobronchial tree. The  procedure was accomplished without difficulty. The                        patient tolerated the procedure well. Scope In: 8:36:26 AM Scope Out: 8:54:45 AM Findings:      The nasopharynx/oropharynx appears normal. The larynx appears normal.       The vocal cords appear normal. The subglottic space is normal. The       trachea is of normal caliber. The carina is sharp. The tracheobronchial       tree was examined to at least the first subsegmental level. Bronchial       mucosa and anatomy are normal; there are no endobronchial lesions, and       no secretions.      Bronchoalveolar lavage was performed in the lingula of  the lung and sent       for cell count, bacterial culture, viral smears & culture, and fungal &       AFB analysis and cytology. 60 mL of fluid were instilled. 24 mL were       returned. The return was blood-tinged. There were no mucoid plugs in the       return fluid.      Transbronchial biopsies of an area of infiltration were performed in the       apical-posterior segment of the left upper lobe, in the anterior segment       of the left upper lobe and in the inferior lingula segment of the left       upper lobe using forceps and sent for histopathology examination. The       procedure was guided by fluoroscopy. Biopsy of lung tissue was obtained.       Five biopsy passes were performed. Five biopsy samples were obtained.      Estimated blood loss: 10 mL.      Verification of patient identification for the specimen was done by the       physician using the patient's name and medical record number. Impression:           - Unresolving bilateral infiltrates                       - The examination was normal.                       - Bronchoalveolar lavage was performed.                       - Transbronchial lung biopsies were performed.                       - The examination was normal. Moderate Sedation:      Moderate (conscious) sedation was personally administered by the       endoscopist. The following parameters were monitored: oxygen saturation,       heart rate, blood pressure, respiratory rate, EKG, adequacy of pulmonary       ventilation, and response to care. Total physician intraservice time was       35 minutes. Recommendation:       - Await BAL, biopsy, culture and cytology results.                       - Follow up with bronchoscopist in 3-4 weeks. Procedure Code(s):    ---  Professional ---                       [phone], Bronchoscopy, rigid or flexible, including                        fluoroscopic guidance, when performed; with                        transbronchial lung  biopsy(s), single lobe                       31624, Bronchoscopy, rigid or flexible, including                        fluoroscopic guidance, when performed; with bronchial                        alveolar lavage                       [id number], Moderate sedation services provided by the same                        physician or other qualified health care professional                        performing the diagnostic or therapeutic service that                        the sedation supports, requiring the presence of an                        independent trained observer to assist in the                        monitoring of the patient's level of consciousness and                        physiological status; initial 15 minutes of                        intraservice time, patient age 60 years or older                       [phone], Moderate sedation services; each additional 15                        minutes intraservice time Diagnosis Code(s):    --- Professional ---                       R91.8, Other nonspecific abnormal finding of lung field CPT copyright 2016 American Medical Association. All rights reserved. The codes documented in this report are preliminary and upon coder review may  be revised to meet current compliance requirements. Collene Gobble, MD Collene Gobble, MD 01/24/2016 9:24:10 AM Number of Addenda: 0

## 2016-01-24 NOTE — H&P (Signed)
Judy White is an 75 y.o. female.   Chief Complaint: small L apical PTX HPI:  75 yo woman with hx A fib and watchman placement, on amiodarone but had been off anti-coag since 01/2015 due to concern regarding hx of SDH in 2007. Also w hx HTN, tobacco use and COPD. She was admitted with light-headedness, near-syncope 10/08/15, found to be hypoxemic. CT chest 8/11 showed scattered RLL PE's, some evidence for interstitial edema and bilateral GGI's. LE doppler study showed RLE DVT. She was been started on therapeutic enoxaparin.and transitioned to Pradaxa at discharge. She was discharged home on oxygen. Amiodarone was stopped. She stopped smoking after the hospitalization. I saw her back for repeat CT chest 12/16/15 that I personally reviewed. This showed changes in the pre-existing groundglass inflammation that had been seen on her August CT scan. Much of this cleared but now there was significant scattered more consolidated infiltrate and a loose nodular pattern. Most notable was in the lingula 3.9 x 2.2 cm. Also noted was some possible right hilar lymphadenopathy.  No new exposures. Not smoking. No mold exposure. No pets / no birds or bats. PFT show mixed obstruction and restriction without a bronchodilator response. Lung volumes are restricted. Diffusion capacity is decreased and does not fully correct when adjusted for alveolar volume. We waited for several weeks post- anticoag for her PE treatment and then planned for FOB off of Pradaxa on 11/26. This was done without incident although she did have stable sinus bradycardia through the case. Underwent L BAL and transbronchial bx's. Post-procedure CXR shows a 5% L apical PTX. She will be admitted for 23h observation, serial CXR. Explained to her that she may require pigtail chest tube depending on how PTX evolved.    Past Medical History:  Diagnosis Date  . Agoraphobia   . Anxiety   . Arthritis    "normal for the age; nothing gives me problems" (10/08/2015)   . COPD (chronic obstructive pulmonary disease) ([place])    "mild" (10/08/2015)  . Depressive disorder, not elsewhere classified   . Hypercholesteremia   . Hypertension   . Myalgia and myositis, unspecified   . Osteopenia   . Overactive bladder   . Paroxysmal atrial fibrillation (HCC)    a. s/p Watchman implant 12/2014  . Pneumonia X 1    Past Surgical History:  Procedure Laterality Date  . APPENDECTOMY  1983  . BACK SURGERY    . CARDIAC CATHETERIZATION     >5 yrs  . CATARACT EXTRACTION W/ INTRAOCULAR LENS  IMPLANT, BILATERAL Bilateral   . CESAREAN SECTION  1967  . LEFT ATRIAL APPENDAGE OCCLUSION N/A 12/31/2014   Procedure: LEFT ATRIAL APPENDAGE OCCLUSION;  Surgeon: Thompson Grayer, MD;  Location: [facility];  Service: Cardiovascular;  Laterality: N/A;  . POSTERIOR FUSION LUMBAR SPINE Bilateral 07/2014  . SHOULDER ARTHROSCOPY W/ ROTATOR CUFF REPAIR Left   . SHOULDER SURGERY Right    "tendon broke; couldn't be repaired"  . TEE WITHOUT CARDIOVERSION N/A 12/22/2014   Procedure: TRANSESOPHAGEAL ECHOCARDIOGRAM (TEE);  Surgeon: Pixie Casino, MD;  Location: [facility];  Service: Cardiovascular;  Laterality: N/A;  . TEE WITHOUT CARDIOVERSION N/A 02/16/2015   post Watchman TEE with good seal and no leak around device   . TONSILLECTOMY    . TOTAL ABDOMINAL HYSTERECTOMY    . WISDOM TOOTH EXTRACTION      Family History  Problem Relation Age of Onset  . Aneurysm Father   . Hypotension Mother   . Neuropathy Brother   .  Heart disease Brother   . Cancer      maternal side  . Heart Problems      paternal side  . Heart attack Neg Hx    Social History:  reports that she quit smoking about 3 months ago. Her smoking use included Cigarettes. She has a 30.00 pack-year smoking history. She has never used smokeless tobacco. She reports that she drinks alcohol. She reports that she does not use drugs.  Allergies: No Known Allergies  Medications Prior to Admission  Medication Sig  Dispense Refill  . acetaminophen (TYLENOL) 500 MG tablet Take 500 mg by mouth every 6 (six) hours as needed for moderate pain or headache.    Marland Kitchen apixaban (ELIQUIS) 5 MG TABS tablet Take 1 tablet (5 mg total) by mouth 2 (two) times daily. 180 tablet 3  . Ascorbic Acid (VITAMIN C PO) Take 500 mg by mouth daily.     . bisoprolol (ZEBETA) 5 MG tablet Take 0.5 tablets (2.5 mg total) by mouth daily. 45 tablet 3  . Calcium-Vitamin D (CALTRATE 600 PLUS-VIT D PO) Take 1 tablet by mouth daily.     . cholecalciferol (VITAMIN D) 1000 UNITS tablet Take 1,000 Units by mouth daily.    . clonazePAM (KLONOPIN) 1 MG tablet Take 1.5-2 mg by mouth daily.     . Cyanocobalamin (VITAMIN B-12 PO) Take 1 tablet by mouth daily.     . furosemide (LASIX) 20 MG tablet TAKE 1 TABLET ONCE DAILY. (Patient taking differently: TAKE 20 MG by mouth ONCE DAILY.) 90 tablet 3  . Omega-3 Fatty Acids (FISH OIL PO) Take 1 tablet by mouth daily.     Marland Kitchen oxybutynin (DITROPAN-XL) 10 MG 24 hr tablet Take 10 mg by mouth daily.     . Potassium 99 MG TABS Take 99 mg by mouth daily.     . Tiotropium Bromide Monohydrate (SPIRIVA RESPIMAT) 1.25 MCG/ACT AERS Inhale 2 puffs into the lungs daily.       No results found for this or any previous visit (from the past 48 hour(s)). Dg Chest Port 1 View  Result Date: 01/24/2016 CLINICAL DATA:  Status post bronchoscopy with left lung biopsies EXAM: PORTABLE CHEST 1 VIEW COMPARISON:  Chest x-ray of 02 December 2015 and chest CT scan of December 16, 2015. FINDINGS: There is an approximately 5% left apical pneumothorax. The interstitial markings of both lungs remain increased diffusely. The cardiac silhouette is top-normal to mildly enlarged. The pulmonary vascularity is not engorged. There is faint calcification in the wall of the aortic arch. There is no pleural effusion. There are degenerative changes of the right shoulder. IMPRESSION: There is a 5% apical pneumothorax on the left. Critical Value/emergent results  were called by telephone at the time of interpretation on 01/24/2016 at 9:20 am to [facility], Respiratory Therapist,, who verbally acknowledged these results. Stable increased lung markings throughout both lungs. Mild cardiomegaly. Thoracic aortic atherosclerosis. Electronically Signed   By: David  Martinique M.D.   On: 01/24/2016 09:23    ROS No complaints post FOB  Blood pressure (!) 99/35, pulse (!) 44, temperature 97.8 F (36.6 C), temperature source Oral, resp. rate (!) 21, SpO2 97 %. Physical Exam  Gen: Pleasant, well-nourished, in no distress,  normal affect  ENT: No lesions,  mouth clear,  oropharynx clear, no postnasal drip  Neck: No JVD, no carotid bruits  Lungs: No use of accessory muscles, few bibasilar insp crackles.   Cardiovascular: sinus bradycardia, heart sounds normal, no murmur or gallops,  no peripheral edema  Abdomen: soft and NT, no HSM,  BS normal  Musculoskeletal: No deformities, no cyanosis or clubbing  Neuro: alert, non focal  Skin: Warm, no lesions or rashes   Assessment/Plan Abnormal CT scan chest with B nodular infiltrates, etiology unclear. Consider residual amiodarone effects (now stopped), infectious or non-infectious pneumonitis. Believe that malignancy less likely given evolution of changes on CT, but this is possible. Await FOB results. Will repeat her imaging in few months to look for interval change.   PE in 09/2015 Holding Pradaxa until 01/26/16  Hx atrial Fib, currently sinus brady Continue home bisoprolol, no plan restart Pradaxa before 11/29  COPD Continue Spiriva, albuterol prn   L iatrogenic PTX, 5% and asymptomatic Admit for observation, serial CXR. Next film 1400 today. If evidence enlargement then will place L pigtail catheter. Hopefully will not be necessary, will be able to d/c home in am 11/28.   Collene Gobble., MD 01/24/2016, 10:11 AM

## 2016-01-24 NOTE — Interval H&P Note (Signed)
PCCM Interval Note.   Pt presents today for further evaluation of her bilateral pulmonary infiltrates notes on CT scans from 10/08/15 and then 12/16/15. Her Tikosyn initiation was delayed. She has continued to have some SOB, chest tightness. No other issues reported. She last took her Eliquis a.m. Dose on 11/24. She did take her bisoprolol this am and note bradycardic now.   Vitals:   01/24/16 0750 01/24/16 0755 01/24/16 0800 01/24/16 0805  BP:  (!) 108/47    Pulse: (!) 40 (!) 40 (!) 40 (!) 39  Resp: 14 16 18 18   Temp:      TempSrc:      SpO2: 96% 96% 96% 97%   Gen: Pleasant, well-nourished, in no distress, somewhat strange affect  ENT: No lesions,  mouth clear,  oropharynx clear, no postnasal drip  Neck: No JVD, no carotid bruits  Lungs: No use of accessory muscles, few bibasilar insp crackles.   Cardiovascular: bradycardic and regular, heart sounds normal, no murmur or gallops, no peripheral edema  Musculoskeletal: No deformities, no cyanosis or clubbing  Neuro: alert, non focal  Skin: Warm, no lesions or rashes   Plans:  Will initiate conscious sedation, proceed with FOB for BAL and TBBx as long as she tolerates sedation hemodynamically. Her bradycardia may be an issue.   Baltazar Apo, MD, PhD 01/24/2016, 8:18 AM [facility] [phone] or if no answer [phone]

## 2016-01-24 NOTE — Discharge Instructions (Signed)
Flexible Bronchoscopy, Care After These instructions give you information on caring for yourself after your procedure. Your doctor may also give you more specific instructions. Call your doctor if you have any problems or questions after your procedure. Follow these instructions at home:  Do not eat or drink anything for 2 hours after your procedure. If you try to eat or drink before the medicine wears off, food or drink could go into your lungs. You could also burn yourself.  After 2 hours have passed and when you can cough and gag normally, you may eat soft food and drink liquids slowly.  The day after the test, you may eat your normal diet.  You may do your normal activities.  Keep all doctor visits. Get help right away if:  You get more and more short of breath.  You get light-headed.  You feel like you are going to pass out (faint).  You have chest pain.  You have new problems that worry you.  You cough up more than a little blood.  You cough up more blood than before.   Do not eat or drink until after 1030 today 01/24/16  PLEASE CALL OUR OFFICE FOR ANY QUESTIONS OR CONCERNS [phone].    This information is not intended to replace advice given to you by your health care provider. Make sure you discuss any questions you have with your health care provider. Document Released: 12/11/2008 Document Revised: 07/22/2015 Document Reviewed: 10/18/2012 Elsevier Interactive Patient Education  2017 Elsevier Inc.   Baltazar Apo, MD, PhD 01/24/2016, 9:11 AM [facility] [phone] or if no answer [phone]

## 2016-01-24 NOTE — Progress Notes (Signed)
Pt came in for outpatient bronchoscopy today. Pt unable to be discharged due to small pneumothorax. Pt will be admitted for 23 hour observation.  Kathie Dike RRT

## 2016-01-24 NOTE — Telephone Encounter (Signed)
Prior auth for Eliquis 5mg  obtained from [employer]. Valid through 01/23/2019. Local pharmacy notified.

## 2016-01-24 NOTE — Progress Notes (Signed)
Video bronchoscopy performed Intervention bronchial washings Intervention bronchial biopsies Pt tolerated well  Kathie Dike RRT   Baltazar Apo, MD, PhD 01/25/2016, 1:31 PM [facility] [phone] or if no answer [phone]

## 2016-01-25 ENCOUNTER — Observation Stay (HOSPITAL_COMMUNITY): Payer: Medicare Other

## 2016-01-25 DIAGNOSIS — N3281 Overactive bladder: Secondary | ICD-10-CM | POA: Diagnosis present

## 2016-01-25 DIAGNOSIS — I82401 Acute embolism and thrombosis of unspecified deep veins of right lower extremity: Secondary | ICD-10-CM | POA: Diagnosis present

## 2016-01-25 DIAGNOSIS — F17200 Nicotine dependence, unspecified, uncomplicated: Secondary | ICD-10-CM | POA: Diagnosis present

## 2016-01-25 DIAGNOSIS — M1990 Unspecified osteoarthritis, unspecified site: Secondary | ICD-10-CM | POA: Diagnosis present

## 2016-01-25 DIAGNOSIS — J189 Pneumonia, unspecified organism: Secondary | ICD-10-CM | POA: Diagnosis not present

## 2016-01-25 DIAGNOSIS — J939 Pneumothorax, unspecified: Secondary | ICD-10-CM | POA: Diagnosis not present

## 2016-01-25 DIAGNOSIS — J449 Chronic obstructive pulmonary disease, unspecified: Secondary | ICD-10-CM | POA: Diagnosis present

## 2016-01-25 DIAGNOSIS — M609 Myositis, unspecified: Secondary | ICD-10-CM | POA: Diagnosis present

## 2016-01-25 DIAGNOSIS — F4000 Agoraphobia, unspecified: Secondary | ICD-10-CM | POA: Diagnosis present

## 2016-01-25 DIAGNOSIS — Z4682 Encounter for fitting and adjustment of non-vascular catheter: Secondary | ICD-10-CM | POA: Diagnosis not present

## 2016-01-25 DIAGNOSIS — Z95818 Presence of other cardiac implants and grafts: Secondary | ICD-10-CM | POA: Diagnosis not present

## 2016-01-25 DIAGNOSIS — I480 Paroxysmal atrial fibrillation: Secondary | ICD-10-CM | POA: Diagnosis present

## 2016-01-25 DIAGNOSIS — Z9889 Other specified postprocedural states: Secondary | ICD-10-CM | POA: Diagnosis not present

## 2016-01-25 DIAGNOSIS — F419 Anxiety disorder, unspecified: Secondary | ICD-10-CM | POA: Diagnosis present

## 2016-01-25 DIAGNOSIS — R001 Bradycardia, unspecified: Secondary | ICD-10-CM | POA: Diagnosis present

## 2016-01-25 DIAGNOSIS — I2699 Other pulmonary embolism without acute cor pulmonale: Secondary | ICD-10-CM | POA: Diagnosis present

## 2016-01-25 DIAGNOSIS — E7800 Pure hypercholesterolemia, unspecified: Secondary | ICD-10-CM | POA: Diagnosis present

## 2016-01-25 DIAGNOSIS — Z8249 Family history of ischemic heart disease and other diseases of the circulatory system: Secondary | ICD-10-CM | POA: Diagnosis not present

## 2016-01-25 DIAGNOSIS — B965 Pseudomonas (aeruginosa) (mallei) (pseudomallei) as the cause of diseases classified elsewhere: Secondary | ICD-10-CM | POA: Diagnosis present

## 2016-01-25 DIAGNOSIS — R0989 Other specified symptoms and signs involving the circulatory and respiratory systems: Secondary | ICD-10-CM | POA: Diagnosis not present

## 2016-01-25 DIAGNOSIS — I10 Essential (primary) hypertension: Secondary | ICD-10-CM | POA: Diagnosis present

## 2016-01-25 DIAGNOSIS — Z7901 Long term (current) use of anticoagulants: Secondary | ICD-10-CM | POA: Diagnosis not present

## 2016-01-25 DIAGNOSIS — M8580 Other specified disorders of bone density and structure, unspecified site: Secondary | ICD-10-CM | POA: Diagnosis present

## 2016-01-25 DIAGNOSIS — Z86711 Personal history of pulmonary embolism: Secondary | ICD-10-CM | POA: Diagnosis not present

## 2016-01-25 DIAGNOSIS — Z79899 Other long term (current) drug therapy: Secondary | ICD-10-CM | POA: Diagnosis not present

## 2016-01-25 DIAGNOSIS — R918 Other nonspecific abnormal finding of lung field: Secondary | ICD-10-CM | POA: Diagnosis not present

## 2016-01-25 DIAGNOSIS — F329 Major depressive disorder, single episode, unspecified: Secondary | ICD-10-CM | POA: Diagnosis present

## 2016-01-25 MED ORDER — MIDAZOLAM HCL 2 MG/2ML IJ SOLN
1.0000 mg | Freq: Once | INTRAMUSCULAR | Status: AC
  Administered 2016-01-25: 1 mg via INTRAVENOUS
  Filled 2016-01-25: qty 2

## 2016-01-25 MED ORDER — ONDANSETRON HCL 4 MG/2ML IJ SOLN
4.0000 mg | Freq: Four times a day (QID) | INTRAMUSCULAR | Status: DC
  Administered 2016-01-25 – 2016-01-27 (×5): 4 mg via INTRAVENOUS
  Filled 2016-01-25 (×7): qty 2

## 2016-01-25 MED ORDER — FENTANYL CITRATE (PF) 100 MCG/2ML IJ SOLN: 25.0000 ug | INTRAMUSCULAR | Status: DC | PRN

## 2016-01-25 MED ORDER — KETOROLAC TROMETHAMINE 15 MG/ML IJ SOLN
15.0000 mg | Freq: Once | INTRAMUSCULAR | Status: AC
  Administered 2016-01-25: 15 mg via INTRAVENOUS
  Filled 2016-01-25: qty 1

## 2016-01-25 MED ORDER — FENTANYL CITRATE (PF) 100 MCG/2ML IJ SOLN
50.0000 ug | INTRAMUSCULAR | Status: DC | PRN
  Administered 2016-01-25: 100 ug via INTRAVENOUS
  Filled 2016-01-25: qty 2

## 2016-01-25 MED ORDER — OXYCODONE-ACETAMINOPHEN 7.5-325 MG PO TABS
2.0000 | ORAL_TABLET | Freq: Four times a day (QID) | ORAL | Status: DC | PRN
  Administered 2016-01-25 – 2016-01-27 (×5): 2 via ORAL
  Filled 2016-01-25 (×5): qty 2

## 2016-01-25 NOTE — Procedures (Signed)
Chest Tube Insertion Procedure Note  Indications:  Clinically significant Pneumothorax  Pre-operative Diagnosis: Pneumothorax  Post-operative Diagnosis: Pneumothorax  Procedure Details  Informed consent was obtained for the procedure, including sedation.  Risks of lung perforation, hemorrhage, arrhythmia, and adverse drug reaction were discussed.   After sterile skin prep, using standard technique, a 14 French tube was placed in the left lateral 7th rib space.  Findings: Excellent air rush, followed 5/7 airleak in sahara system briefly. Then down to 1/7 w/ tidal noted   Estimated Blood Loss:  Minimal         Specimens:  None              Complications:  None; patient tolerated the procedure well.         Disposition: in room          Condition: stable  Erick Colace ACNP-BC [place] Pager # [phone] OR # [phone] if no answer   Baltazar Apo, MD, PhD 01/26/2016, 9:29 AM [facility] [phone] or if no answer [phone]

## 2016-01-25 NOTE — Discharge Summary (Signed)
Physician Discharge Summary  Patient ID: Judy White MRN: [id number] DOB/AGE: [birth year] 75 y.o.  Admit date: 01/24/2016 Discharge date: 01/28/2016    Discharge Diagnoses:  Abnormal CT scan with Bilateral Nodular Infiltrates  Hx of PE in 09/2015 Atrial Fibrillation Sinus Bradycardia  COPD  Left Iatrogenic Pneumothorax s/p chest tube                                                                      DISCHARGE PLAN BY DIAGNOSIS     Abnormal CT scan with Bilateral Nodular Infiltrates  Hx of PE in 09/2015 COPD  Left Iatrogenic Pneumothorax requiring Chest tube   Discharge Plan: Follow up with Pulmonary NP with CXR  01/31/2016 @ 11am Resume home pradaxa 12/3. Pt instructed on bleeding risks and when to return to ER. Complete 7 days QOD Levaquin for BAL + for pseudamonas  Continue spiriva + PRN albuterol   Atrial Fibrillation Sinus Bradycardia   Discharge Plan: Continue home bisoprolol                   DISCHARGE SUMMARY   Judy White is a 75 y.o. y/o female with a PMH of A fib s/p watchman placement, on amiodarone but had been off anti-coag since 01/2015 due to concern regarding hx of SDH in 2007. Also w hx HTN, tobacco use and COPD. She was admitted with light-headedness, near-syncope 10/08/15, found to be hypoxemic. CT chest 8/11 showed scattered RLL PE's, some evidence for interstitial edema and bilateral GGI's. LE doppler study showed RLE DVT. She was been started on therapeutic enoxaparin and transitioned to Pradaxa at discharge. She was discharged home on oxygen. Amiodarone was stopped. She stopped smoking after the hospitalization. She was seen by Dr. Lamonte Sakai for repeat CT chest 12/16/15.  This showed changes in the pre-existing groundglass inflammation that had been seen on her August CT scan. Much of this cleared but now there was significant scattered more consolidated infiltrate and a loose nodular pattern. Most notable was in the lingula 3.9 x 2.2 cm. Also noted was some  possible right hilar lymphadenopathy.  Pt denied new exposures. PFT show mixed obstruction and restriction without a bronchodilator response. Lung volumes are restricted. Diffusion capacity is decreased and does not fully correct when adjusted for alveolar volume. We waited for several weeks post- anticoag for her PE treatment and then planned for FOB off of Pradaxa on 11/26. This was done without incident although she did have stable sinus bradycardia through the case. Underwent L BAL and transbronchial bx's. Post-procedure CXR shows a 5% L apical PTX. She was admitted for observation, serial CXR's were monitored.  CXR on 11/28 demonstrated an increase in size of pneumothorax. Subsequently a left small bore chest tube was placed to low wall suction. She was transitioned to water seal 11/30.CXR 12/1 indicated no evidence of pneumothorax. Chest tube was d/c'd 01/28/16, with follow up CXR indicating continued resolution of pneumothorax.Cultures positive for pseudomonas. Pt. Treated with Levaquin . Medically cleared for discharge 01/28/2016 with plans as above.            STUDIES:  FOB with biopsy 11/27 >> + for pseudomonas, Cytology negative  Cultures: BAL 11/27>> Pseudomonas  Discharge Exam: General:  Well developed adult female  in NAD, OOB in chair  Neuro: AAOx4, speech clear, MAE  CV: s1s2 rrr, no m/r/g  PULM: even/non-labored, lungs bilaterally with crackles posteriorly GI: soft, bsx4 active, non-tender Extremities: warm/dry, no edema, no rashes or lesions  Vitals:   01/27/16 1545 01/27/16 2049 01/28/16 0528 01/28/16 0901  BP: 100/63 (!) 95/53 (!) 86/51   Pulse: 93 63 74   Resp: '18 18 18   ' Temp: 98.2 F (36.8 C) 97.7 F (36.5 C) 98.2 F (36.8 C)   TempSrc: Oral Oral Oral   SpO2: 90% 90% 93% 95%  Weight:      Height:         Discharge Labs  BMET No results for input(s): NA, K, CL, CO2, GLUCOSE, BUN, CREATININE, CALCIUM, MG, PHOS in the last 168 hours.  CBC No results for  input(s): HGB, HCT, WBC, PLT in the last 168 hours.  Anti-Coagulation No results for input(s): INR in the last 168 hours.  Discharge Instructions    Call MD for:  difficulty breathing, headache or visual disturbances    Complete by:  As directed    Call MD for:  extreme fatigue    Complete by:  As directed    Call MD for:  hives    Complete by:  As directed    Call MD for:  persistant dizziness or light-headedness    Complete by:  As directed    Call MD for:  persistant nausea and vomiting    Complete by:  As directed    Call MD for:  redness, tenderness, or signs of infection (pain, swelling, redness, odor or green/yellow discharge around incision site)    Complete by:  As directed    Call MD for:  severe uncontrolled pain    Complete by:  As directed    Call MD for:  temperature >100.4    Complete by:  As directed    Diet - low sodium heart healthy    Complete by:  As directed    Discharge instructions    Complete by:  As directed    1. Resume Pradaxa on 12/3/ 17. Report to the Emergency room immediately with any bleeding from chest tube site, shortness of breath,chest pain, or coughing up of bloody secretions. 2. Complete antibiotics as prescribed. 3. Review home medications carefully as they may have changed. 4. Follow up appointment at [facility] with Chest x ray prior as scheduled. 5. Call the office for any new or worsening concerns.   Increase activity slowly    Complete by:  As directed        Follow-up Information    Collene Gobble., MD Follow up on 02/15/2016.   Specialty:  Pulmonary Disease Why:  1:45pm Contact information: [address] [address] [place], [place] Follow up on 01/31/2016.   Specialty:  Pulmonary Disease Why:   11 am, please arrive at 10:30 for CXR Contact information: [address] [place] [address] [phone]              Medication List    TAKE these  medications   acetaminophen 500 MG tablet Commonly known as:  TYLENOL Take 500 mg by mouth every 6 (six) hours as needed for moderate pain or headache.   apixaban 5 MG Tabs tablet Commonly known as:  ELIQUIS Take 1 tablet (5 mg total) by mouth 2 (two) times daily. Start as previously instructed when  Pradaxa runs out. Do not take pradaxa and Eliquis at the same time. What changed:  additional instructions   bisoprolol 5 MG tablet Commonly known as:  ZEBETA Take 0.5 tablets (2.5 mg total) by mouth daily.   CALTRATE 600 PLUS-VIT D PO Take 1 tablet by mouth daily.   cholecalciferol 1000 units tablet Commonly known as:  VITAMIN D Take 1,000 Units by mouth daily.   clonazePAM 1 MG tablet Commonly known as:  KLONOPIN Take 0.5-1.5 mg by mouth See admin instructions. Take 1 1/2 tablets (1.5 mg) by mouth every morning, may also take 1/2 tablet (0.5 mg) in the afternoon as needed for panic attacks   dabigatran 150 MG Caps capsule Commonly known as:  PRADAXA Take 1 capsule (150 mg total) by mouth 2 (two) times daily. Start taking on:  01/30/2016   Fish Oil 1000 MG Caps Take 1,000 mg by mouth daily.   furosemide 20 MG tablet Commonly known as:  LASIX TAKE 1 TABLET ONCE DAILY. What changed:  See the new instructions.   levofloxacin 750 MG tablet Commonly known as:  LEVAQUIN Take 1 tablet (750 mg total) by mouth every other day. Start taking on:  01/29/2016   oxybutynin 10 MG 24 hr tablet Commonly known as:  DITROPAN-XL Take 10 mg by mouth daily.   Potassium 99 MG Tabs Take 99 mg by mouth daily.   SPIRIVA RESPIMAT 1.25 MCG/ACT Aers Generic drug:  Tiotropium Bromide Monohydrate Inhale 2 puffs into the lungs daily.   vitamin B-12 1000 MCG tablet Commonly known as:  CYANOCOBALAMIN Take 1,000 mcg by mouth daily.   vitamin C 500 MG tablet Commonly known as:  ASCORBIC ACID Take 500 mg by mouth daily.         Disposition:  Home, no new home health needs identified at  discharge.   Discharged Condition: Judy White has met maximum benefit of inpatient care and is medically stable and cleared for discharge.  Patient is pending follow up as above.      Time spent on disposition:  Greater than 35 minutes.   Signed: Noe Gens, NP-C [facility] Pgr: [phone] Office: [phone]

## 2016-01-25 NOTE — H&P (Signed)
Chief Complaint: small L apical PTX  SUMMARY:  75 yo woman with hx A fib and watchman placement, on amiodarone but had been off anti-coag since 01/2015 due to concern regarding hx of SDH in 2007. Also w hx HTN, tobacco use and COPD. She was admitted with light-headedness, near-syncope 10/08/15, found to be hypoxemic. CT chest 8/11 showed scattered RLL PE's, some evidence for interstitial edema and bilateral GGI's. LE doppler study showed RLE DVT. She was been started on therapeutic enoxaparin and transitioned to Pradaxa at discharge. She was discharged home on oxygen. Amiodarone was stopped. She stopped smoking after the hospitalization. She was seen by Dr. Lamonte Sakai for repeat CT chest 12/16/15.  This showed changes in the pre-existing groundglass inflammation that had been seen on her August CT scan. Much of this cleared but now there was significant scattered more consolidated infiltrate and a loose nodular pattern. Most notable was in the lingula 3.9 x 2.2 cm. Also noted was some possible right hilar lymphadenopathy.  No new exposures. Not smoking. No mold exposure. No pets / no birds or bats. PFT show mixed obstruction and restriction without a bronchodilator response. Lung volumes are restricted. Diffusion capacity is decreased and does not fully correct when adjusted for alveolar volume. We waited for several weeks post- anticoag for her PE treatment and then planned for FOB off of Pradaxa on 11/26. This was done without incident although she did have stable sinus bradycardia through the case. Underwent L BAL and transbronchial bx's. Post-procedure CXR shows a 5% L apical PTX. She was admitted for observation, serial CXR.     SUBJECTIVE:  Pt denies significant pain, reports discomfort/soreness with coughing. Denies hemoptysis/sputum production.   Results for orders placed or performed during the hospital encounter of 01/24/16 (from the past 48 hour(s))  Body fluid cell count with differential      Status: Abnormal   Collection Time: 01/24/16  8:48 AM  Result Value Ref Range   Fluid Type-FCT BRONCHIAL ALVEOLAR LAVAGE    Color, Fluid COLORLESS (A) YELLOW   Appearance, Fluid BLOODY (A) CLEAR   WBC, Fluid 900 0 - 1,000 cu mm   Neutrophil Count, Fluid 92 (H) 0 - 25 %   Lymphs, Fluid 3 %   Monocyte-Macrophage-Serous Fluid 3 (L) 50 - 90 %   Eos, Fluid 2 %   Other Cells, Fluid EXTRACELLULAR BACTERIA %  Culture, bal-quantitative     Status: None (Preliminary result)   Collection Time: 01/24/16  9:06 AM  Result Value Ref Range   Specimen Description BRONCHIAL ALVEOLAR LAVAGE    Special Requests NONE    Gram Stain      FEW WBC PRESENT,BOTH PMN AND MONONUCLEAR RARE SQUAMOUS EPITHELIAL CELLS PRESENT RARE GRAM POSITIVE COCCI IN PAIRS RARE GRAM NEGATIVE RODS    Culture CULTURE REINCUBATED FOR BETTER GROWTH    Report Status PENDING    Dg Chest 2 View  Result Date: 01/25/2016 CLINICAL DATA:  Pneumothorax.  Left chest discomfort. EXAM: CHEST  2 VIEW COMPARISON:  01/24/2016 FINDINGS: Small left apical pneumothorax, slightly increased in size since prior study, approximately 5 to 10%. Reticulonodular interstitial prominence throughout the lungs, stable. Heart is borderline in size. No effusions. IMPRESSION: Slight increased size of the left apical pneumothorax, 5-10%. Otherwise no change. Electronically Signed   By: Rolm Baptise M.D.   On: 01/25/2016 07:21   X-ray Chest Pa And Lateral  Result Date: 01/24/2016 CLINICAL DATA:  Status post bronchoscopy this morning. Patient reports soreness in the upper chest  since the procedure. History of left-sided pneumothorax. EXAM: CHEST  2 VIEW COMPARISON:  Portable chest x-ray of earlier today FINDINGS: The less than 5% apical pneumothorax on the left is not as well demonstrated on the current study but a small amount of pleural space air persists. Both lungs are well-expanded. The interstitial markings of both lungs remain increased. There is no  significant pleural effusion. The heart is mildly enlarged though stable. The pulmonary vascularity is not clearly engorged. The observed bony thorax exhibits no acute abnormality. IMPRESSION: Slightly decreased conspicuity of the 5% left apical pneumothorax. The examination is otherwise stable. Electronically Signed   By: David  Martinique M.D.   On: 01/24/2016 14:54   Dg Chest Port 1 View  Result Date: 01/24/2016 CLINICAL DATA:  Status post bronchoscopy with left lung biopsies EXAM: PORTABLE CHEST 1 VIEW COMPARISON:  Chest x-ray of 02 December 2015 and chest CT scan of December 16, 2015. FINDINGS: There is an approximately 5% left apical pneumothorax. The interstitial markings of both lungs remain increased diffusely. The cardiac silhouette is top-normal to mildly enlarged. The pulmonary vascularity is not engorged. There is faint calcification in the wall of the aortic arch. There is no pleural effusion. There are degenerative changes of the right shoulder. IMPRESSION: There is a 5% apical pneumothorax on the left. Critical Value/emergent results were called by telephone at the time of interpretation on 01/24/2016 at 9:20 am to [facility], Respiratory Therapist,, who verbally acknowledged these results. Stable increased lung markings throughout both lungs. Mild cardiomegaly. Thoracic aortic atherosclerosis. Electronically Signed   By: David  Martinique M.D.   On: 01/24/2016 09:23    ROS  Blood pressure (!) 101/55, pulse 67, temperature 98.5 F (36.9 C), resp. rate 18, height 5\' 6"  (1.676 m), weight 177 lb 7.5 oz (80.5 kg), SpO2 93 %.   Physical Exam  Gen: Pleasant, well-nourished, in no distress,  normal affect ENT: No lesions,  mouth clear,  oropharynx clear, no postnasal drip Neck: No JVD, no carotid bruits Lungs: No use of accessory muscles, posterior crackles 1/2 way up.  Cardiovascular: sinus bradycardia, heart sounds normal, no murmur or gallops, no peripheral edema Abdomen: soft and NT, no HSM,   BS normal Musculoskeletal: No deformities, no cyanosis or clubbing Neuro: alert, non focal Skin: Warm, no lesions or rashes   Assessment/Plan Abnormal CT scan chest with B nodular infiltrates, etiology unclear. Consider residual amiodarone effects (now stopped), infectious or non-infectious pneumonitis. Believe that malignancy less likely given evolution of changes on CT, but this is possible. Await FOB results. Will repeat her imaging in few months to look for interval change.   PE in 09/2015 Hold Pradaxa until 01/26/16  Hx atrial Fib, currently sinus brady Continue home bisoprolol, no plan restart Pradaxa before 11/29  COPD Continue Spiriva, albuterol prn   L iatrogenic PTX, 5% and asymptomatic Continue observation, serial CXR. Next film 1200 today. She has had some increase in pain. If evidence enlargement then will place L pigtail catheter. Hopefully will not be necessary, & will be able to d/c home.      Noe Gens, NP-C [facility] Pgr: [phone] or if no answer [phone] 01/25/2016, 9:48 AM   Attending Note:  I have examined patient, reviewed labs, studies and notes. I have discussed the case with B Ollis, and I agree with the data and plans as amended above. 75 yo woman s/p FOB and TBBx's on 11/27 for B infiltrates. She has a small L apical ptx.  It may have been slightly larger on her am CXR today. Based on that we will repeat her film mid-day to insure stability. If no enlargement then she should be able to go home. If it does get bigger she will need continued observation, possibly pigtail catheter. Plan discussed with pt and her family.   Baltazar Apo, MD, PhD 01/25/2016, 1:32 PM [facility] [phone] or if no answer [phone]

## 2016-01-25 NOTE — Progress Notes (Signed)
PM CXR reviewed.  Mild increase in PTX size to ~ 10%.  Images personally reviewed with Dr. Lamonte Sakai.     Plan:  Place small bore chest tube  CT to 20 cm suction  Follow up CXR in am    Noe Gens, NP-C [facility] Pgr: [phone] or if no answer [phone] 01/25/2016, 3:29 PM

## 2016-01-26 ENCOUNTER — Inpatient Hospital Stay (HOSPITAL_COMMUNITY): Payer: Medicare Other

## 2016-01-26 MED ORDER — CLONAZEPAM 1 MG PO TABS
1.5000 mg | ORAL_TABLET | Freq: Every day | ORAL | Status: DC | PRN
  Administered 2016-01-26: 0.5 mg via ORAL
  Administered 2016-01-27 – 2016-01-28 (×2): 1.5 mg via ORAL
  Filled 2016-01-26 (×3): qty 1

## 2016-01-26 MED ORDER — CLONAZEPAM 0.5 MG PO TABS: 0.5000 mg | ORAL_TABLET | Freq: Every day | ORAL | Status: DC | PRN

## 2016-01-26 MED ORDER — CLONAZEPAM 0.5 MG PO TABS: 0.5000 mg | ORAL_TABLET | Freq: Every evening | ORAL | Status: DC | PRN

## 2016-01-26 NOTE — Progress Notes (Signed)
Called by RN regarding home klonopin dosing and nausea.     Plan: Adjusted klonopin to home PRN dosing  PRN zofran in place   Noe Gens, NP-C [place] Pgr: [phone] or if no answer [phone] 01/26/2016, 12:49 PM

## 2016-01-26 NOTE — Progress Notes (Signed)
Name: Judy White MRN: [id number] DOB: 04-02-1940    ADMISSION DATE:  01/24/2016  CHIEF COMPLAINT:  ptx  BRIEF PATIENT DESCRIPTION: 75 yo woman with hx A fib and watchman placement, on amiodarone but had been off anti-coag since 01/2015 due to concern regarding hx of SDH in 2007. Also w hx HTN, tobacco use and COPD. Presented 11/27 for FOB with Bx and washings.  Post proecure CXR shows a 5% L apical PTX. She was admitted for observation, serial CXR. Unfortunately PTX continued to slowly enlarge and she required L pigtail chest tube placement 11/28.   SIGNIFICANT EVENTS  11/27 FOB with bx 11/28>> Pigtail cath  STUDIES:    SUBJECTIVE:  Feels well, no c/o.  Mild SOB but improved after pigtail.  Mild pain tube site.   VITAL SIGNS: Temp:  [97.5 F (36.4 C)-98.2 F (36.8 C)] 98.2 F (36.8 C) (11/29 0518) Pulse Rate:  [45-107] 45 (11/29 0518) Resp:  [15-17] 17 (11/29 0518) BP: (80-102)/(40-53) 102/52 (11/29 0518) SpO2:  [93 %-98 %] 96 % (11/29 0518)  PHYSICAL EXAMINATION: General:  Pleasant female, NAD, sitting OOB in chair  Neuro:  Awake, alert, appropriate, MAE  HEENT:  Mm moist, no JVD  Cardiovascular:  s1s2 rrr Lungs:  resps even non labored on [place], clear, L pigtail chest tube in place, no air leak  Abdomen:  Soft, +bs  Musculoskeletal:  Warm and dry, no edema   No results for input(s): NA, K, CL, CO2, BUN, CREATININE, GLUCOSE in the last 168 hours. No results for input(s): HGB, HCT, WBC, PLT in the last 168 hours. Dg Chest 2 View  Result Date: 01/25/2016 CLINICAL DATA:  Left apical pneumothorax EXAM: CHEST  2 VIEW COMPARISON:  CXR from earlier on the same day at at 0425 hours FINDINGS: Minimally larger left apical pneumothorax now seen arm to the posterior left third rib level. Bilateral prominent pulmonary interstitial change is as before without pneumonic consolidation or effusion. Slight tracheal deviation convex to the right. The heart is top-normal in size. No aortic  aneurysm. No acute osseous abnormality. IMPRESSION: Minimally larger left apical roughly 10% pneumothorax Electronically Signed   By: Ashley Royalty M.D.   On: 01/25/2016 15:00   Dg Chest 2 View  Result Date: 01/25/2016 CLINICAL DATA:  Pneumothorax.  Left chest discomfort. EXAM: CHEST  2 VIEW COMPARISON:  01/24/2016 FINDINGS: Small left apical pneumothorax, slightly increased in size since prior study, approximately 5 to 10%. Reticulonodular interstitial prominence throughout the lungs, stable. Heart is borderline in size. No effusions. IMPRESSION: Slight increased size of the left apical pneumothorax, 5-10%. Otherwise no change. Electronically Signed   By: Rolm Baptise M.D.   On: 01/25/2016 07:21   X-ray Chest Pa And Lateral  Result Date: 01/24/2016 CLINICAL DATA:  Status post bronchoscopy this morning. Patient reports soreness in the upper chest since the procedure. History of left-sided pneumothorax. EXAM: CHEST  2 VIEW COMPARISON:  Portable chest x-ray of earlier today FINDINGS: The less than 5% apical pneumothorax on the left is not as well demonstrated on the current study but a small amount of pleural space air persists. Both lungs are well-expanded. The interstitial markings of both lungs remain increased. There is no significant pleural effusion. The heart is mildly enlarged though stable. The pulmonary vascularity is not clearly engorged. The observed bony thorax exhibits no acute abnormality. IMPRESSION: Slightly decreased conspicuity of the 5% left apical pneumothorax. The examination is otherwise stable. Electronically Signed   By: David  Martinique M.D.  On: 01/24/2016 14:54   Dg Chest Port 1 View  Result Date: 01/26/2016 CLINICAL DATA:  Pneumothorax and left chest tube. EXAM: PORTABLE CHEST 1 VIEW COMPARISON:  01/25/2016 FINDINGS: Again noted is a pigtail chest tube in the periphery of the left chest. There continues to be a small left apical pneumothorax which has not significantly changed.  Patchy parenchymal lung densities are essentially unchanged. Mild elevation of right hemidiaphragm is stable. Heart size remains mildly enlarged but stable. Again noted is a left atrial appendage occlusion device. IMPRESSION: Stable appearance of the small left apical pneumothorax. Stable position of the left chest tube. No significant change in the bilateral parenchymal lung densities. Electronically Signed   By: Markus Daft M.D.   On: 01/26/2016 08:00   Dg Chest Port 1 View  Result Date: 01/25/2016 CLINICAL DATA:  Chest tube placement EXAM: PORTABLE CHEST 1 VIEW COMPARISON:  1430 hours FINDINGS: Left chest tube as been placed in the lateral left mid lung zone. Left pneumothorax has markedly improved with minimal residual left apical pneumothorax. Vascular congestion throughout both lungs with suspected mild pulmonary edema has increased. Cardiomegaly. No right pneumothorax. IMPRESSION: Left chest tube placed with nearly resolved left pneumothorax. Increasing vascular congestion and pulmonary edema. Electronically Signed   By: Marybelle Killings M.D.   On: 01/25/2016 18:07    ASSESSMENT / PLAN:   L iatrogenic PTX, 5% and asymptomatic - s/p pigtail cath 11/28. Still some residual L apical ptx but smaller than pre-tube placement PLAN -  F/u CXR in am  Continue pigtail to suction overnight tonight, then if CXR stable change to waterseal in am 11/30  Abnormal CT scan chest with B nodular infiltrates, etiology unclear. Consider residual amiodarone effects (now stopped), infectious or non-infectious pneumonitis. Believe that malignancy less likely given evolution of changes on CT, but this is possible. Await FOB results. Will repeat her imaging in few months to look for interval change.   PE in 09/2015 Hold Pradaxa until 01/26/16  Hx atrial Fib, currently sinus brady Continue home bisoprolol, no plan restart Pradaxa until her pigtail is out  COPD Continue Spiriva, albuterol prn    Nickolas Madrid,  NP 01/26/2016  10:13 AM Pager: [phone] or [phone]  Attending Note:  I have examined patient, reviewed labs, studies and notes. I have discussed the case with Shon Millet, and I agree with the data and plans as amended above. S/p iatrogenic L PTX after FOB for B infiltrates. Pigtail placed yesterday pm with some resolution pleural air. I will leave her on suction overnight tonight, recheck CXR am. If PTX stable then go to waterseal and follow serial films to determine timing for pigtail removal. Hold Pradaxa until the tube is out. Reviewed plan w the patient.   Baltazar Apo, MD, PhD 01/26/2016, 10:36 AM [facility] [phone] or if no answer [phone]

## 2016-01-27 ENCOUNTER — Inpatient Hospital Stay (HOSPITAL_COMMUNITY): Payer: Medicare Other

## 2016-01-27 LAB — CULTURE, BAL-QUANTITATIVE

## 2016-01-27 MED ORDER — BISACODYL 5 MG PO TBEC
5.0000 mg | DELAYED_RELEASE_TABLET | Freq: Every day | ORAL | Status: DC | PRN
  Administered 2016-01-27: 5 mg via ORAL
  Filled 2016-01-27 (×2): qty 1

## 2016-01-27 MED ORDER — LEVOFLOXACIN 750 MG PO TABS
750.0000 mg | ORAL_TABLET | ORAL | Status: DC
  Administered 2016-01-27: 750 mg via ORAL
  Filled 2016-01-27: qty 1

## 2016-01-27 NOTE — Progress Notes (Signed)
Name: Judy White MRN: [id number] DOB: 28-Aug-1940    ADMISSION DATE:  01/24/2016  CHIEF COMPLAINT:  ptx  BRIEF PATIENT DESCRIPTION: 75 yo woman with hx A fib and watchman placement, on amiodarone but had been off anti-coag since 01/2015 due to concern regarding hx of SDH in 2007. Also w hx HTN, tobacco use and COPD. Presented 11/27 for FOB with Bx and washings for nodular disease .  Post procedure CXR shows a 5% L apical PTX. She was admitted for observation, serial CXR. Unfortunately PTX continued to slowly enlarge and she required L pigtail chest tube placement 11/28.   SIGNIFICANT EVENTS  11/27 FOB with bx 11/28>> Pigtail cath  STUDIES:    SUBJECTIVE:   Mild pain tube site.  No dyspnea  VITAL SIGNS: Temp:  [97.7 F (36.5 C)-98.4 F (36.9 C)] 98.4 F (36.9 C) (11/30 0521) Pulse Rate:  [45-91] 55 (11/30 0521) Resp:  [18] 18 (11/30 0521) BP: (96-110)/(50-65) 110/50 (11/30 0521) SpO2:  [93 %-95 %] 93 % (11/30 0742)  PHYSICAL EXAMINATION: General:  Pleasant female, NAD, sitting OOB in chair  Neuro:  Awake, alert, appropriate, MAE  HEENT:  Mm moist, no JVD  Cardiovascular:  s1s2 rrr Lungs:  resps even non labored on [place], clear, L pigtail chest tube in place, no air leak on pleurovac Abdomen:  Soft, +bs  Musculoskeletal:  Warm and dry, no edema   No results for input(s): NA, K, CL, CO2, BUN, CREATININE, GLUCOSE in the last 168 hours. No results for input(s): HGB, HCT, WBC, PLT in the last 168 hours. Dg Chest 2 View  Result Date: 01/25/2016 CLINICAL DATA:  Left apical pneumothorax EXAM: CHEST  2 VIEW COMPARISON:  CXR from earlier on the same day at at 0425 hours FINDINGS: Minimally larger left apical pneumothorax now seen arm to the posterior left third rib level. Bilateral prominent pulmonary interstitial change is as before without pneumonic consolidation or effusion. Slight tracheal deviation convex to the right. The heart is top-normal in size. No aortic aneurysm. No acute  osseous abnormality. IMPRESSION: Minimally larger left apical roughly 10% pneumothorax Electronically Signed   By: Ashley Royalty M.D.   On: 01/25/2016 15:00   Dg Chest Portable 1 View  Result Date: 01/27/2016 CLINICAL DATA:  Pneumothorax post biopsy. EXAM: PORTABLE CHEST 1 VIEW COMPARISON:  01/26/2016. FINDINGS: Left chest tube in stable position. Interim resolution of small left apical pneumothorax. Left atrial occlusion device again noted. Stable cardiomegaly. Stable chronic interstitial changes. Low lung volumes. No pleural effusion. Diffuse osteopenia P IMPRESSION: 1. Left chest tube in stable position. Interim resolution of left apical pneumothorax. Low lung volumes with basilar atelectasis. 2.  Stable cardiomegaly . Electronically Signed   By: Marcello Moores  Register   On: 01/27/2016 07:36   Dg Chest Port 1 View  Result Date: 01/26/2016 CLINICAL DATA:  Pneumothorax and left chest tube. EXAM: PORTABLE CHEST 1 VIEW COMPARISON:  01/25/2016 FINDINGS: Again noted is a pigtail chest tube in the periphery of the left chest. There continues to be a small left apical pneumothorax which has not significantly changed. Patchy parenchymal lung densities are essentially unchanged. Mild elevation of right hemidiaphragm is stable. Heart size remains mildly enlarged but stable. Again noted is a left atrial appendage occlusion device. IMPRESSION: Stable appearance of the small left apical pneumothorax. Stable position of the left chest tube. No significant change in the bilateral parenchymal lung densities. Electronically Signed   By: Markus Daft M.D.   On: 01/26/2016 08:00  Dg Chest Port 1 View  Result Date: 01/25/2016 CLINICAL DATA:  Chest tube placement EXAM: PORTABLE CHEST 1 VIEW COMPARISON:  1430 hours FINDINGS: Left chest tube as been placed in the lateral left mid lung zone. Left pneumothorax has markedly improved with minimal residual left apical pneumothorax. Vascular congestion throughout both lungs with  suspected mild pulmonary edema has increased. Cardiomegaly. No right pneumothorax. IMPRESSION: Left chest tube placed with nearly resolved left pneumothorax. Increasing vascular congestion and pulmonary edema. Electronically Signed   By: Marybelle Killings M.D.   On: 01/25/2016 18:07    ASSESSMENT / PLAN:   L iatrogenic PTX, 5% and asymptomatic - s/p pigtail cath 11/28. resolved PLAN -  F/u CXR in am  Continue pigtail to waterseal  X24, then dc in am  Abnormal CT scan chest with B nodular infiltrates, etiology unclear. Consider residual amiodarone effects (now stopped), infectious or non-infectious pneumonitis. Believe that malignancy less likely given evolution of changes on CT, but this is possible. Await FOB results -BAL shows pseudomonas  PE in 09/2015 Hx atrial Fib, currently sinus brady Continue home bisoprolol, no plan restart Pradaxa until her pigtail is out  COPD Continue Spiriva, albuterol prn   Kara Mead MD. FCCP. [facility] Pager [phone] If no response call [phone]   01/27/2016  12:18 PM

## 2016-01-28 ENCOUNTER — Inpatient Hospital Stay (HOSPITAL_COMMUNITY): Payer: Medicare Other

## 2016-01-28 ENCOUNTER — Encounter: Payer: Self-pay | Admitting: Emergency Medicine

## 2016-01-28 MED ORDER — LEVOFLOXACIN 750 MG PO TABS: 750.0000 mg | ORAL_TABLET | ORAL | 0 refills | Status: AC

## 2016-01-28 MED ORDER — APIXABAN 5 MG PO TABS: 5.0000 mg | ORAL_TABLET | Freq: Two times a day (BID) | ORAL | 3 refills | Status: DC

## 2016-01-28 MED ORDER — DABIGATRAN ETEXILATE MESYLATE 150 MG PO CAPS: 150.0000 mg | ORAL_CAPSULE | Freq: Two times a day (BID) | ORAL | Status: DC

## 2016-01-28 NOTE — Telephone Encounter (Signed)
RB  Please advise- Please see pt. email below   Good morning Dr. Lamonte Sakai. This is [employer].I am [name] son. My family is wondering if Mom's lung biopsy results are completed. She had the sample taken on 01/24/16 and it's 01/28/16. She's anxious to get her results. Unfortunately waiting in the hospital for her pneumothorax to heal is making life a little difficult. If you could let us know something it would be appreciated. Thanks, [name].

## 2016-01-28 NOTE — Care Management Important Message (Signed)
Important Message  Patient Details  Name: Judy White MRN: [id number] Date of Birth: 12-28-1940   Medicare Important Message Given:  Yes    Nathen May 01/28/2016, 1:23 PM

## 2016-01-28 NOTE — Progress Notes (Signed)
Chest x-ray today on waterseal shows resolution of pneumothorax Chest tube was discontinued by me today, uneventful We'll follow-up repeat film-and if okay, she can discharge home today She will be on Levaquin for 7 days for Pseudomonas and BAL culture She can resume Pradaxa and will need follow-up appointment with Dr. Lamonte Sakai in 1 week Local wound care advised to chest tube site  [facility] V. MD

## 2016-01-28 NOTE — Progress Notes (Signed)
Patient discharged to home with instructions. 

## 2016-01-31 ENCOUNTER — Ambulatory Visit (INDEPENDENT_AMBULATORY_CARE_PROVIDER_SITE_OTHER)
Admission: RE | Admit: 2016-01-31 | Discharge: 2016-01-31 | Disposition: A | Discharge status: Home / Self Care | Payer: Medicare Other | Source: Ambulatory Visit | Attending: Acute Care | Admitting: Acute Care

## 2016-01-31 ENCOUNTER — Encounter: Payer: Self-pay | Admitting: Acute Care

## 2016-01-31 ENCOUNTER — Other Ambulatory Visit (INDEPENDENT_AMBULATORY_CARE_PROVIDER_SITE_OTHER): Payer: Medicare Other

## 2016-01-31 ENCOUNTER — Ambulatory Visit (INDEPENDENT_AMBULATORY_CARE_PROVIDER_SITE_OTHER): Payer: Medicare Other | Admitting: Acute Care

## 2016-01-31 ENCOUNTER — Telehealth: Payer: Self-pay | Admitting: Acute Care

## 2016-01-31 ENCOUNTER — Institutional Professional Consult (permissible substitution): Payer: Medicare Other | Admitting: Cardiology

## 2016-01-31 VITALS — BP 96/54 | HR 105 | Ht 66.0 in | Wt 175.4 lb

## 2016-01-31 DIAGNOSIS — J939 Pneumothorax, unspecified: Secondary | ICD-10-CM | POA: Diagnosis not present

## 2016-01-31 DIAGNOSIS — J42 Unspecified chronic bronchitis: Secondary | ICD-10-CM | POA: Diagnosis not present

## 2016-01-31 DIAGNOSIS — R918 Other nonspecific abnormal finding of lung field: Secondary | ICD-10-CM | POA: Diagnosis not present

## 2016-01-31 DIAGNOSIS — I6523 Occlusion and stenosis of bilateral carotid arteries: Secondary | ICD-10-CM

## 2016-01-31 LAB — CBC
HEMATOCRIT: 43.7 % (ref 36.0–46.0)
Hemoglobin: 14.5 g/dL (ref 12.0–15.0)
MCHC: 33.2 g/dL (ref 30.0–36.0)
MCV: 87.6 fl (ref 78.0–100.0)
Platelets: 279 10*3/uL (ref 150.0–400.0)
RBC: 4.99 Mil/uL (ref 3.87–5.11)
RDW: 14.2 % (ref 11.5–15.5)
WBC: 12.9 10*3/uL — ABNORMAL HIGH (ref 4.0–10.5)

## 2016-01-31 LAB — ACID FAST SMEAR (AFB, MYCOBACTERIA)

## 2016-01-31 LAB — ACID FAST SMEAR (AFB): ACID FAST SMEAR - AFSCU2: NEGATIVE

## 2016-01-31 NOTE — Assessment & Plan Note (Addendum)
Resolution of pneumothorax per CXR 01/31/16. Plan: CXR 01/31/2016 We will refer to Cardiopulmonary rehab ( Dr. Lamonte Sakai) Lower Extremity bilateral venous doppler studies  CBC today. If you cough is keeping you awake at night, you can try Delsym 12 hour cough syrup. If your oxygen saturations drop below 88% you need oxygen. Avoid mint, menthol and chocolate. Therapeutic trial of  zantac ( ranitidine) 150 mg at bedtime only. Follow up with Dr. Lamonte Sakai 02/15/2016 as scheduled. Advised on when to present to ED regarding s/s pneumothorax

## 2016-01-31 NOTE — Progress Notes (Signed)
History of Present Illness Judy White is a 75 y.o. female recent former smoker ( with a 30 pack year smoking history) with PE 09/2015, COPD, HTN, PAF( Watchman Device) on chronic anticoagulation followed by Dr. Lamonte Sakai.   PMH includes a SDH in 200.  01/31/2016 Hospital Follow Up: Pt. Returns today for follow up after chest tube removal as an inpatient on 01/28/2016. She developed a small left latrogenic Pneumothorax after a bronchoscopy/ biopsy on 01/24/16. She underwent a L BAL and transbronchial biopsies. Post-procedure CXR shows a 5% L apical PTX. She wasadmitted for observation, serial CXR's were monitored.  CXR on 11/28 demonstrated an increase in size of pneumothorax. Subsequently a left small bore chest tube was placed to low wall suction. She was transitioned to water seal 11/30.CXR 12/1 indicated no evidence of pneumothorax. Chest tube was d/c'd 01/28/16, with follow up CXR indicating continued resolution of pneumothorax.Cultures positive for pseudomonas. Pt. Treated with Levaquin . Medically cleared for discharge 01/28/2016. CXR  01/31/2016  conforms no pneumothorax., and otherwise stable film.Site is unremarkable and healing. There is no bruising. She does have a productive cough for clear to white secretions.She is completing her Levaquin over  the next 2 days.She denies fever,orthopnea or hemoptysis. She does have some chest discomfort which she feels is due to her cough and some post procedure ( Chest tube insertion) pain. She is using Tylenol for this. She has resumed her Pradaxa as of 01/30/2016. She is planning to complete her pradaxa prescription, and then will transition to Eliquis as was previously planned due to cost .We reviewed bleeding precautions.She has a complaint of bilateral calf pain. There are no knots, redness or swelling  upon exam, but as she was off her pradaxa for the biopsy and chest tube insertion/removal, we will check doppler studies to ensure no DVT. She does complain of  fatigue, which is most likely related to deconditioning, but we will check CBC today. Additionally we will refer to cardiopulmonary rehab.She has a follow up appointment with Dr. Lamonte Sakai 02/15/2016. Of note, she was very upset today in the office about not being told how to find the Radiology Dept. She is aware that cytology was negative and of the biopsy results.( See below)  We did discuss that she has an appointment with Dr. Lamonte Sakai 12/19 to discuss continued follow up of her pulmonary nodules.  Tests  CXR 01/31/2016 Reviewed by me. IMPRESSION: There is no pneumothorax. There is no effusion 1. No acute cardiopulmonary disease or significant interval change. 2. Stable bilateral pulmonary nodules. 3. No pneumothorax. 4. Cardiomegaly and chronic interstitial coarsening is stable. 5. Aortic atherosclerosis.   Lung, transbronchial biopsy, LUL and Lingular TBBx's - BENIGN LUNG TISSUE AND BRONCHIAL MUCOSA. - NO GRANULOMAS OR MALIGNANCY.  Past medical hx Past Medical History:  Diagnosis Date  . Agoraphobia   . Anxiety   . Arthritis    "normal for the age; nothing gives me problems" (01/24/2016)  . COPD (chronic obstructive pulmonary disease) ([place])    "mild" (01/24/2016)  . Depressive disorder, not elsewhere classified   . DVT (deep venous thrombosis) ([place]) 10/08/2015   "they said the blood clot went from one of my legs to my lung"  . History of hiatal hernia    "small one" (01/24/2016)  . Hypercholesteremia   . Hypertension    pt denies this hx on 01/24/2016  . Myalgia and myositis, unspecified   . Osteopenia   . Overactive bladder   . Paroxysmal atrial fibrillation ([facility])  a. s/p Watchman implant 12/2014  . Pulmonary embolism ([place]) 10/08/2015     Past surgical hx, Family hx, Social hx all reviewed.  Current Outpatient Prescriptions on File Prior to Visit  Medication Sig  . acetaminophen (TYLENOL) 500 MG tablet Take 500 mg by mouth every 6 (six) hours as needed for moderate  pain or headache.  . bisoprolol (ZEBETA) 5 MG tablet Take 0.5 tablets (2.5 mg total) by mouth daily.  . Calcium-Vitamin D (CALTRATE 600 PLUS-VIT D PO) Take 1 tablet by mouth daily.   . cholecalciferol (VITAMIN D) 1000 UNITS tablet Take 1,000 Units by mouth daily.  . clonazePAM (KLONOPIN) 1 MG tablet Take 0.5-1.5 mg by mouth See admin instructions. Take 1 1/2 tablets (1.5 mg) by mouth every morning, may also take 1/2 tablet (0.5 mg) in the afternoon as needed for panic attacks  . dabigatran (PRADAXA) 150 MG CAPS capsule Take 1 capsule (150 mg total) by mouth 2 (two) times daily.  . furosemide (LASIX) 20 MG tablet TAKE 1 TABLET ONCE DAILY. (Patient taking differently: TAKE 1 TABLET ONCE DAILY)  . levofloxacin (LEVAQUIN) 750 MG tablet Take 1 tablet (750 mg total) by mouth every other day.  . Omega-3 Fatty Acids (FISH OIL) 1000 MG CAPS Take 1,000 mg by mouth daily.  Marland Kitchen oxybutynin (DITROPAN-XL) 10 MG 24 hr tablet Take 10 mg by mouth daily.   . Potassium 99 MG TABS Take 99 mg by mouth daily.   . Tiotropium Bromide Monohydrate (SPIRIVA RESPIMAT) 1.25 MCG/ACT AERS Inhale 2 puffs into the lungs daily.   . vitamin B-12 (CYANOCOBALAMIN) 1000 MCG tablet Take 1,000 mcg by mouth daily.  . vitamin C (ASCORBIC ACID) 500 MG tablet Take 500 mg by mouth daily.  Marland Kitchen apixaban (ELIQUIS) 5 MG TABS tablet Take 1 tablet (5 mg total) by mouth 2 (two) times daily. Start as previously instructed when Pradaxa runs out. Do not take pradaxa and Eliquis at the same time. (Patient not taking: Reported on 01/31/2016)   No current facility-administered medications on file prior to visit.      No Known Allergies  Review Of Systems:  Constitutional:   No  weight loss, night sweats,  Fevers, chills, + fatigue, or  lassitude.  HEENT:   No headaches,  Difficulty swallowing,  Tooth/dental problems, or  Sore throat,                No sneezing, itching, ear ache, nasal congestion, post nasal drip,   CV:  No chest pain,  Orthopnea,  PND, swelling in lower extremities, anasarca, dizziness, palpitations, syncope.   GI  No heartburn, indigestion, abdominal pain, nausea, vomiting, diarrhea, change in bowel habits, loss of appetite, bloody stools.   Resp: No shortness of breath with exertion or at rest.  + excess mucus, + productive cough,  No non-productive cough,  No coughing up of blood.  No change in color of mucus.  No wheezing.  No chest wall deformity  Skin: no rash or lesions.  GU: no dysuria, change in color of urine, no urgency or frequency.  No flank pain, no hematuria   MS:  No joint pain or swelling.  No decreased range of motion.  No back pain. + bilateral calf pain  Psych:  No change in mood or affect. No depression or anxiety.  No memory loss.   Vital Signs BP (!) 96/54 (BP Location: Right Arm, Patient Position: Sitting, Cuff Size: Normal)   Pulse (!) 105   Ht 5\' 6"  (1.676 m)  Wt 175 lb 6.4 oz (79.6 kg)   SpO2 96%   BMI 28.31 kg/m    Physical Exam:  General- No distress,  A&Ox3, tired, aggitated ENT: No sinus tenderness, TM clear, pale nasal mucosa, no oral exudate,no post nasal drip, no LAN Cardiac: S1, S2, regular rate and rhythm, no murmur Chest: No wheeze/ rales/ dullness; no accessory muscle use, no nasal flaring, no sternal retractions Abd.: Soft Non-tender Ext: No clubbing cyanosis, edema Neuro:  normal strength Skin: No rashes, warm and dry, left chest tube site healing without drainage Psych: normal mood and behavior, aggitated   Assessment/Plan  Pneumothorax on left Resolution of pneumothorax per CXR 01/31/16. Plan: CXR 01/31/2016 We will refer to Cardiopulmonary rehab ( Dr. Lamonte Sakai) Lower Extremity bilateral venous doppler studies  CBC today. If you cough is keeping you awake at night, you can try Delsym 12 hour cough syrup. If your oxygen saturations drop below 88% you need oxygen. Avoid mint, menthol and chocolate. Therapeutic trial of  zantac ( ranitidine) 150 mg at bedtime  only. Follow up with Dr. Lamonte Sakai 02/15/2016 as scheduled. Advised on when to present to ED regarding s/s pneumothorax  COPD (chronic obstructive pulmonary disease) ([place]) Continue Spiriva as you have been doing Oxygen saturations goal is > 88%. You will need oxygen if saturations drop below 88%.    [name], NP 01/31/2016  3:39 PM

## 2016-01-31 NOTE — Assessment & Plan Note (Signed)
Continue Spiriva as you have been doing Oxygen saturations goal is > 88%. You will need oxygen if saturations drop below 88%.

## 2016-01-31 NOTE — Telephone Encounter (Signed)
I called Judy White with the results of her blood work drawn today. Explained that her hemoglobin was 14. I explained that her fatigue therefore was not due to any kind of anemia. In that I suspect it is due to deconditioning from being in the hospital. She verbalized understanding and had no questions upon completion of the call.

## 2016-01-31 NOTE — Patient Instructions (Addendum)
It is nice to see you again. We are sorry you had a bad experience today with finding the x ray department. We will refer to Cardiopulmonary rehab ( Dr. Lamonte Sakai) Lower Extremity bilateral venous doppler studies  CBC today. If you cough is keeping you awake at night, you can try Delsym 12 hour cough syrup. If your oxygen saturations drop below 88% you need oxygen. Avoid mint, menthol and chocolate. Therapeutic trial of  zantac ( ranitidine) 150 mg at bedtime. Follow up with Dr. Lamonte Sakai 02/15/2016 Please contact office for sooner follow up if symptoms do not improve or worsen or seek emergency care

## 2016-02-02 ENCOUNTER — Telehealth: Payer: Self-pay | Admitting: Emergency Medicine

## 2016-02-02 MED ORDER — TIOTROPIUM BROMIDE MONOHYDRATE 1.25 MCG/ACT IN AERS: 2.0000 | INHALATION_SPRAY | Freq: Every day | RESPIRATORY_TRACT | 0 refills | Status: DC

## 2016-02-02 NOTE — Telephone Encounter (Signed)
Sample up front and pt aware  Nothing further needed

## 2016-02-04 ENCOUNTER — Encounter: Payer: Self-pay | Admitting: Acute Care

## 2016-02-04 ENCOUNTER — Other Ambulatory Visit: Payer: Self-pay | Admitting: Emergency Medicine

## 2016-02-04 DIAGNOSIS — M79605 Pain in left leg: Principal | ICD-10-CM

## 2016-02-04 DIAGNOSIS — M79604 Pain in right leg: Secondary | ICD-10-CM

## 2016-02-04 NOTE — Telephone Encounter (Signed)
SG please advise. Thanks   

## 2016-02-07 ENCOUNTER — Encounter: Payer: Self-pay | Admitting: Acute Care

## 2016-02-07 ENCOUNTER — Encounter (HOSPITAL_COMMUNITY)
Admission: RE | Admit: 2016-02-07 | Discharge: 2016-02-07 | Disposition: A | Discharge status: Home / Self Care | Payer: Medicare Other | Source: Ambulatory Visit | Attending: Emergency Medicine | Admitting: Emergency Medicine

## 2016-02-07 ENCOUNTER — Encounter: Payer: Self-pay | Admitting: Interventional Cardiology

## 2016-02-07 NOTE — Telephone Encounter (Signed)
[name] please see the patient email below... ----- Message -----    From: Judy White    Sent: 02/07/2016  3:18 PM EST      To: [name], NP Subject: Visit Follow-Up Question  [name], the [name] denied my entrance due to my heart rate of 118 at rest.  I have to get my rapid heart beat corrected to 70 or 80 before I can be admitted. [name] (1940/10/26) ----------------------  Patient advised to speak with Cardiologist if they have one to get recommendations for decreasing the heart rate. Please advise SG if any rec's. Thanks.

## 2016-02-07 NOTE — Progress Notes (Signed)
Judy White arrived with her husband today for orientation to pulmonary rehab.  Unfortunately it was not completed due to uncontrolled atrial fib with a rate of 118-125 at rest.  She was asymptomatic and this is not a new finding for her.  She has a consult  with Dr. Curt Bears March 07, 2015 to discuss her options.  She and her husband understand the reason to not complete orientation and to hold off on entering the program.  I anticipate her heart rate to be even more elevated and uncontrolled when she begins exercising.  She was instructed to call us when her atrial fib is controlled so as to complete orientation and finally participate in pulmonary rehab.   [phone]

## 2016-02-08 ENCOUNTER — Ambulatory Visit (HOSPITAL_COMMUNITY)
Admission: RE | Admit: 2016-02-08 | Discharge: 2016-02-08 | Disposition: A | Discharge status: Home / Self Care | Payer: Medicare Other | Source: Ambulatory Visit | Attending: Internal Medicine | Admitting: Internal Medicine

## 2016-02-08 ENCOUNTER — Telehealth: Payer: Self-pay | Admitting: Acute Care

## 2016-02-08 DIAGNOSIS — I6523 Occlusion and stenosis of bilateral carotid arteries: Secondary | ICD-10-CM | POA: Insufficient documentation

## 2016-02-08 NOTE — Telephone Encounter (Signed)
   I let my cardiologist, Dr. [name] know also.  We have been working on getting this done.  Just wanted Dr. Lamonte Sakai & [name] know.  Thanks

## 2016-02-08 NOTE — Telephone Encounter (Signed)
I have called Judy White. She had emailed me with some questions regarding her CXR done 01/31/2016 that she had reviewed on My Chart.I explained that what was documented in the radiology report as a Tricuspid valve replacement was actually notation of the Watchman device she had placed 12/31/2014 for her atrial fibrillation. She verbalized understanding and had no further questions .

## 2016-02-10 ENCOUNTER — Ambulatory Visit (HOSPITAL_COMMUNITY)
Admission: RE | Admit: 2016-02-10 | Discharge: 2016-02-10 | Disposition: A | Discharge status: Home / Self Care | Payer: Medicare Other | Source: Ambulatory Visit | Attending: Cardiology | Admitting: Cardiology

## 2016-02-10 DIAGNOSIS — M79604 Pain in right leg: Secondary | ICD-10-CM | POA: Diagnosis not present

## 2016-02-10 DIAGNOSIS — M79605 Pain in left leg: Secondary | ICD-10-CM | POA: Insufficient documentation

## 2016-02-11 ENCOUNTER — Telehealth: Payer: Self-pay | Admitting: Interventional Cardiology

## 2016-02-11 ENCOUNTER — Encounter: Payer: Self-pay | Admitting: *Deleted

## 2016-02-11 NOTE — Telephone Encounter (Signed)
Request for surgical clearance:  1. What type of surgery is being performed? Tooth Extraction with local anesthesia and nitrous oxide   2. When is this surgery scheduled? Pending   3. Are there any medications that need to be held prior to surgery and how long? Pradaxa .  Also need to know if she requires antibiotics prior to procedure.  4. Name of physician performing surgery? [facility] Dr. Buelah Manis and Dr. Otilio Carpen   5. What is your office phone and fax number? Ph [phone] Fax [phone]

## 2016-02-12 NOTE — Telephone Encounter (Signed)
She is on Pradaxa for pulmonary emboli. She needs six months of therapy before discontinuation for elective surgery. Even then the Pradaxa will need to be resumed for another 6 months. PE was 10/08/15.  Therefore tooth extraction should be in February when safe to hold Pradaxa. Med should be held 3 days prior to procedure.

## 2016-02-14 NOTE — Telephone Encounter (Signed)
Faxed to [facility].

## 2016-02-15 ENCOUNTER — Ambulatory Visit (INDEPENDENT_AMBULATORY_CARE_PROVIDER_SITE_OTHER): Payer: Medicare Other | Admitting: Emergency Medicine

## 2016-02-15 ENCOUNTER — Encounter: Payer: Self-pay | Admitting: Emergency Medicine

## 2016-02-15 VITALS — BP 102/64 | HR 103 | Ht 66.0 in | Wt 176.0 lb

## 2016-02-15 DIAGNOSIS — R911 Solitary pulmonary nodule: Secondary | ICD-10-CM | POA: Diagnosis not present

## 2016-02-15 DIAGNOSIS — I2782 Chronic pulmonary embolism: Secondary | ICD-10-CM | POA: Diagnosis not present

## 2016-02-15 DIAGNOSIS — J939 Pneumothorax, unspecified: Secondary | ICD-10-CM | POA: Diagnosis not present

## 2016-02-15 DIAGNOSIS — I6523 Occlusion and stenosis of bilateral carotid arteries: Secondary | ICD-10-CM

## 2016-02-15 DIAGNOSIS — J849 Interstitial pulmonary disease, unspecified: Secondary | ICD-10-CM | POA: Diagnosis not present

## 2016-02-15 NOTE — Assessment & Plan Note (Signed)
I would like for her to complete 6 months of therapy. She's gone back and forth between Pradaxa and Eliquis. Currently on Pradaxa. 6 months of therapy would and in February. She may be a candidate to continue anticoagulation indefinitely for her atrial fibrillation. She will discuss this with cardiology.

## 2016-02-15 NOTE — Progress Notes (Signed)
History of Present Illness Judy White is a 75 y.o. female current smoker with recent PE started on Pradaxa 10/07/15, COPD, HTN, paroxysmal-A.Fib, patient previously not on blood thinners despite CHADS vasc of 4 due to history of brain bleed. She has a Watchman device in her left atrial appendage.    HPI: 75 yo woman with hx A fib and watchman placement, on amiodarone but not anti-coag since 01/2015 due to concern regarding hx of SDH in 2007. Also w hx HTN, tobacco use and COPD. She was admitted with light-headedness, near-syncope 8/11, found to be hypoxemic. CT chest 8/11 showed scattered RLL PE's, some evidence for interstitial edema and GGI. LE doppler study showed RLE DVT. She was been started on therapeutic enoxaparin.and transitioned to Pradaxa at discharge. She was discharged home on oxygen. Amiodarone was stopped. She stopped smoking after the hospitalization.   She reports that she had a hospitalization beginning of October for atrial fibrillation. Her breathing has improved some. She has fatigue, no new resp sx. No new exposures. Not smoking. No mold exposure. No pets / no birds or bats.   She underwent a CT scan of her chest on 12/16/15 that I personally reviewed. This showed changes in the pre-existing groundglass inflammation that had been seen on her August CT scan. Much of this has cleared but now there is significant scattered more consolidated infiltrate and a loose nodular pattern. Most notable was in the lingula 3.9 x 2.2 cm.  Also noted was some possible right hilar lymphadenopathy.    ROV 01/03/16 -- the patient returns for evaluation of her history of pulmonary embolism, abnormal CT scan of the chest, former tobacco use.. As detailed above she has scattered loose nodular disease on CT scan that we will likely need BAL and biopsy. She is currently undergoing evaluation for initiation of Tykosyn and I would like for her to have this done before we performed bronchoscopy. She underwent  pulmonary function testing 12/31/15 and I personally reviewed the results. The spirometry shows mixed obstruction and restriction without a bronchodilator response. Lung volumes are restricted. Diffusion capacity is decreased and does not fully correct when adjusted for alveolar volume.   ROV 02/15/16 -- this follow-up visit for evaluation of an abnormal CT scan of the chest with scattered loose nodular disease noted on scan from hospitalization in August when she was diagnosed with pulmonary embolism. A follow-up scan 12/16/15 showed some changes but persistence of the scattered nodular consolidations. Based on this we performed a bronchoscopy on 01/24/16. BAL showed 92% neutrophils, WBC 900, cytology negative for malignancy, transbronchial biopsies negative for granulomas or malignancy. AFB and fungal cultures negative. Unfortunately course was complicated by a left-sided pneumothorax for which she required pigtail chest tube. She is currently on pradaxa, tolerating well. She is still planning to discuss possible Tikosyn with cardiology/ EP.  She is doing a trial of Spiriva for the last month - unsure whether it has helped her in any way, breathing feels the same.    Tests CT-PA 8/11 >> several RLL pulmonary emboli, evidence for pulmonary vascular congestion with some basilar more confluent GG change LE doppler US 8/12 >> RLE DVT ECHO  10/09/2015>> PAP is 28 mm Hg   Review Of Systems: As per HPI   Vital Signs BP 102/64 (BP Location: Right Arm, Cuff Size: Normal)   Pulse (!) 103   Ht 5\' 6"  (1.676 m)   Wt 176 lb (79.8 kg)   SpO2 93%   BMI 28.41 kg/m  Vitals:   02/15/16 1349  BP: 102/64  Pulse: (!) 103  SpO2: 93%  Weight: 176 lb (79.8 kg)  Height: 5\' 6"  (1.676 m)   Physical Exam:  General- No distress,  A&Ox3, pleasant ENT: No sinus tenderness, TM clear, pale nasal mucosa, no oral exudate,no post nasal drip, no LAN Cardiac: S1, S2, regular rate and rhythm, no murmur Chest: No  wheeze/ + crackles / dullness; no accessory muscle use, no nasal flaring, no sternal retractions Abd.: Soft Non-tender Ext: No clubbing cyanosis, edema Neuro:  normal strength Skin: No rashes, warm and dry Psych: normal mood and behavior   Assessment/Plan  COPD (chronic obstructive pulmonary disease) (HCC) Unclear that she has benefited from Spiriva. She believes that her breathing is about the same. We will stop it and see if she notices a decline in her functional capacity her breathing off the medication. If so then we may consider restarting.  Pulmonary embolism ([place]) I would like for her to complete 6 months of therapy. She's gone back and forth between Pradaxa and Eliquis. Currently on Pradaxa. 6 months of therapy would and in February. She may be a candidate to continue anticoagulation indefinitely for her atrial fibrillation. She will discuss this with cardiology.  Interstitial lung disease (HCC) Bilateral pulmonary infiltrates of unclear etiology. We speculated that but they might of been related to amiodarone toxicity. Based on their persistent nature in October we performed bronchoscopy that has been unrevealing. Cultures are still pending. At this point I believe we should wait, watch and repeat a CT scan of her chest in February to look for possible resolution. If they are still present at that time then we will discuss any further diagnostics.  Pneumothorax on left Resolved    Baltazar Apo, MD, PhD 02/15/2016, 2:20 PM [facility] [phone] or if no answer [phone]

## 2016-02-15 NOTE — Assessment & Plan Note (Signed)
Resolved

## 2016-02-15 NOTE — Assessment & Plan Note (Signed)
Unclear that she has benefited from Spiriva. She believes that her breathing is about the same. We will stop it and see if she notices a decline in her functional capacity her breathing off the medication. If so then we may consider restarting.

## 2016-02-15 NOTE — Assessment & Plan Note (Signed)
Bilateral pulmonary infiltrates of unclear etiology. We speculated that but they might of been related to amiodarone toxicity. Based on their persistent nature in October we performed bronchoscopy that has been unrevealing. Cultures are still pending. At this point I believe we should wait, watch and repeat a CT scan of her chest in February to look for possible resolution. If they are still present at that time then we will discuss any further diagnostics.

## 2016-02-15 NOTE — Patient Instructions (Addendum)
We will repeat your CT chest in February 2018, no contrast, to evaluate pulmonary nodules.  We will continue to follow your bronchoscopy results to completion You will need to be on anticoagulation until at least February. You may be on this longer depending on your discussions with cardiology.  We will stop Spiriva. Please keep track of whether you miss this medication.  Follow with Dr Lamonte Sakai in February 2018.

## 2016-02-16 LAB — FUNGUS CULTURE WITH STAIN

## 2016-02-16 LAB — FUNGAL ORGANISM REFLEX

## 2016-02-16 LAB — FUNGUS CULTURE RESULT

## 2016-02-22 ENCOUNTER — Encounter: Payer: Self-pay | Admitting: Emergency Medicine

## 2016-02-22 ENCOUNTER — Encounter: Payer: Self-pay | Admitting: Interventional Cardiology

## 2016-02-22 NOTE — Telephone Encounter (Signed)
MW please advise.  Thanks.  

## 2016-02-29 ENCOUNTER — Encounter: Payer: Self-pay | Admitting: Internal Medicine

## 2016-02-29 ENCOUNTER — Ambulatory Visit (INDEPENDENT_AMBULATORY_CARE_PROVIDER_SITE_OTHER): Payer: Medicare Other | Admitting: Internal Medicine

## 2016-02-29 VITALS — BP 118/74 | HR 58 | Ht 66.0 in | Wt 179.8 lb

## 2016-02-29 DIAGNOSIS — I2699 Other pulmonary embolism without acute cor pulmonale: Secondary | ICD-10-CM | POA: Diagnosis not present

## 2016-02-29 DIAGNOSIS — J449 Chronic obstructive pulmonary disease, unspecified: Secondary | ICD-10-CM

## 2016-02-29 DIAGNOSIS — I10 Essential (primary) hypertension: Secondary | ICD-10-CM | POA: Diagnosis not present

## 2016-02-29 NOTE — Progress Notes (Signed)
History of Present Illness GIANELLY SIME is a 76 y.o. female current smoker with recent PE started on Pradaxa 10/07/15, COPD, HTN, paroxysmal-A.Fib, patient previously not on blood thinners despite CHADS vasc of 4 due to history of brain bleed. She has a Watchman device in her left atrial appendage.    HPI: 76 yo woman with hx A fib and watchman placement, on amiodarone but not anti-coag since 01/2015 due to concern regarding hx of SDH in 2007.   ROV 02/15/16 -- this follow-up visit for evaluation of an abnormal CT scan of the chest with scattered loose nodular disease noted on scan from hospitalization in August when she was diagnosed with pulmonary embolism. A follow-up scan 12/16/15 showed some changes but persistence of the scattered nodular consolidations. Based on this we performed a bronchoscopy on 01/24/16. BAL showed 92% neutrophils, WBC 900, cytology negative for malignancy, transbronchial biopsies negative for granulomas or malignancy. AFB and fungal cultures negative. Unfortunately course was complicated by a left-sided pneumothorax for which she required pigtail chest tube. She is currently on pradaxa, tolerating well. She is still planning to discuss possible Tikosyn with cardiology/ EP.  She is doing a trial of Spiriva for the last month - unsure whether it has helped her in any way, breathing feels the same.  rec We will repeat your CT chest in February 2018, no contrast, to evaluate pulmonary nodules.  We will continue to follow your bronchoscopy results to completion You will need to be on anticoagulation until at least February. You may be on this longer depending on your discussions with cardiology.  We will stop Spiriva. Please keep track of whether you miss this medication.     02/29/2016  f/u ov/Revella Shelton re: ? Adverse drug effects / copd off spiriva since last ov / quit smoking 09/2015  Chief Complaint  Patient presents with  . Follow-up    Pt c/o hair loss and nausea which she  related to her Pradaxa. She tried off of med for a few days and then went back on it. She is having less nausea now.   onset of nausea   Since starting pradaxa/bisoprolol  August 2017 and worse x 2 weeks prior to OV  And rec try off x 72 hours but never did and now feeling better taking pradaxa before bfast and supper and   that seemed to helped as did stopping zebeta   No vomiting/ no change with greasy food but nausea is assoc with mild midline epigastric discomfort s radiation/  no sure better with maalox /meals  No obvious day to day or daytime variability or assoc excess/ purulent sputum or mucus plugs or hemoptysis or cp or chest tightness, subjective wheeze or overt sinus or hb symptoms. No unusual exp hx or h/o childhood pna/ asthma or knowledge of premature birth.  Sleeping ok without nocturnal  or early am exacerbation  of respiratory  c/o's or need for noct saba. Also denies any obvious fluctuation of symptoms with weather or environmental changes or other aggravating or alleviating factors except as outlined above   Current Medications, Allergies, Complete Past Medical History, Past Surgical History, Family History, and Social History were reviewed in [employer] record.  ROS  The following are not active complaints unless bolded sore throat, dysphagia, dental problems, itching, sneezing,  nasal congestion or excess/ purulent secretions, ear ache,   fever, chills, sweats, unintended wt loss, classically pleuritic or exertional cp,  orthopnea pnd or leg swelling, presyncope,  palpitations, abdominal pain, anorexia, nausea, vomiting, diarrhea  or change in bowel or bladder habits, change in stools or urine, dysuria,hematuria,  rash, arthralgias, visual complaints, headache, numbness, weakness or ataxia or problems with walking or coordination,  change in mood/affect or memory.              Tests CT-PA 8/11 >> several RLL pulmonary emboli, evidence for pulmonary  vascular congestion with some basilar more confluent GG change LE doppler US 8/12 >> RLE DVT ECHO  10/09/2015>> 76 PAP is 28 mm Hg      Physical Exam:   Wt Readings from Last 3 Encounters:  02/29/16 179 lb 12.8 oz (81.6 kg)  02/15/16 176 lb (79.8 kg)  01/31/16 175 lb 6.4 oz (79.6 kg)    Vital signs reviewed - Note on arrival 02 sats  96% on RA and note bp fine off bisoprolol / pulse 58      General-   Anxious but quite pleasant amb wf nad  ENT: No sinus tenderness, TM clear, pale nasal mucosa, no oral exudate,no post nasal drip, no LAN Cardiac: S1, S2, regular rate and rhythm, no murmur Chest: No wheeze/min insp crackles / No dullness; no accessory muscle use  Abd.: Soft Non-tender including area over epigastrium where not having active pain at time of exam. Ext: No clubbing cyanosis, edema Neuro:  normal strength Skin: No rashes, warm and dry/ minimal hair loss noted  Psych: normal mood and behavior   Assessment/Plan    I personally reviewed images and agree with radiology impression as follows:  CXR:   01/31/16 1. No acute cardiopulmonary disease or significant interval change. 2. Stable bilateral pulmonary nodules. 3. No pneumothorax. 4. Cardiomegaly and chronic interstitial coarsening is stable. 5. Aortic atherosclerosis.

## 2016-02-29 NOTE — Patient Instructions (Signed)
Make sure to use the pradaxa after meals twice daily   Leave the fish oil off for a couple of weeks and eat more fish to see what difference this makes

## 2016-03-01 NOTE — Assessment & Plan Note (Signed)
Doing well on pradaxa though may be having nausea/ dyspepsia which have been reported   The info on pradaxa says it can be taken before or after meals and she's better since changed to pc  Note also absence of wt loss assoc with the complaint so should be able to tol one more month as plan as I reviewed it with her from Dr Agustina Caroli notes was 6 m rx which ends w/in about 5 weeks.  I had an extended discussion with the patient reviewing all relevant studies completed to date and  lasting 15 to 20 minutes of a 25 minute acute visit with pt not previously known to me    Each maintenance medication was reviewed in detail including most importantly the difference between maintenance and prns and under what circumstances the prns are to be triggered using an action plan format that is not reflected in the computer generated alphabetically organized AVS.    Please see AVS for specific instructions unique to this visit that I personally wrote and verbalized to the the pt in detail and then reviewed with pt  by my nurse highlighting any  changes in therapy recommended at today's visit to their plan of care.

## 2016-03-01 NOTE — Assessment & Plan Note (Signed)
D/c'd bisoprolol due to nausea/? improved   Adequate control on present rx, reviewed in detail with pt > no change in rx needed = no rx   In her particular case we may well find that less is more (to avoid adverse drug effects or perception thereof)

## 2016-03-01 NOTE — Assessment & Plan Note (Signed)
Quit smoking 09/2015 - PFT's  02/29/2016  FEV1 1.32 (59 % ) ratio 75  p no % improvement from saba p ? prior to study with DLCO  37 % corrects to 63 % for alv volume  - minimal curvature to f/v loop  - Spirva d/c'd 02/15/16 > no change in symptoms   She does not have   symptoms or findings to support dx of significant copd at this point doing fine off lama     I reviewed the Fletcher curve with the patient that basically indicates  if you quit smoking when your best day FEV1 is still well preserved (as is clearly  the case here)  it is highly unlikely you will progress to severe disease and informed the patient there was  no medication on the market that has proven to alter the curve/ its downward trajectory  or the likelihood of progression of their disease(unlike other chronic medical conditions such as atheroclerosis where we do think we can change the natural hx with risk reducing meds)    Therefore stopping smoking and maintaining abstinence is the most important aspect of her care, not choice of inhalers or for that matter, doctors.

## 2016-03-06 ENCOUNTER — Ambulatory Visit: Payer: Medicare Other | Admitting: Cardiology

## 2016-03-07 ENCOUNTER — Ambulatory Visit: Payer: Medicare Other | Admitting: Emergency Medicine

## 2016-03-08 LAB — ACID FAST CULTURE WITH REFLEXED SENSITIVITIES (MYCOBACTERIA)

## 2016-03-08 LAB — ACID FAST CULTURE WITH REFLEXED SENSITIVITIES: ACID FAST CULTURE - AFSCU3: NEGATIVE

## 2016-04-05 ENCOUNTER — Encounter: Payer: Self-pay | Admitting: Interventional Cardiology

## 2016-04-05 ENCOUNTER — Ambulatory Visit (INDEPENDENT_AMBULATORY_CARE_PROVIDER_SITE_OTHER)
Admission: RE | Admit: 2016-04-05 | Discharge: 2016-04-05 | Disposition: A | Discharge status: Home / Self Care | Payer: Medicare Other | Source: Ambulatory Visit | Attending: Emergency Medicine | Admitting: Emergency Medicine

## 2016-04-05 DIAGNOSIS — R911 Solitary pulmonary nodule: Secondary | ICD-10-CM

## 2016-04-06 DIAGNOSIS — F064 Anxiety disorder due to known physiological condition: Secondary | ICD-10-CM | POA: Diagnosis not present

## 2016-04-10 DIAGNOSIS — I2699 Other pulmonary embolism without acute cor pulmonale: Secondary | ICD-10-CM | POA: Diagnosis not present

## 2016-04-10 DIAGNOSIS — I10 Essential (primary) hypertension: Secondary | ICD-10-CM | POA: Diagnosis not present

## 2016-04-10 DIAGNOSIS — I480 Paroxysmal atrial fibrillation: Secondary | ICD-10-CM | POA: Diagnosis not present

## 2016-04-10 DIAGNOSIS — E559 Vitamin D deficiency, unspecified: Secondary | ICD-10-CM | POA: Diagnosis not present

## 2016-04-10 DIAGNOSIS — L659 Nonscarring hair loss, unspecified: Secondary | ICD-10-CM | POA: Diagnosis not present

## 2016-04-10 DIAGNOSIS — E782 Mixed hyperlipidemia: Secondary | ICD-10-CM | POA: Diagnosis not present

## 2016-04-12 ENCOUNTER — Encounter: Payer: Self-pay | Admitting: Emergency Medicine

## 2016-04-12 ENCOUNTER — Ambulatory Visit (INDEPENDENT_AMBULATORY_CARE_PROVIDER_SITE_OTHER): Payer: Medicare Other | Admitting: Emergency Medicine

## 2016-04-12 VITALS — BP 120/74 | HR 65 | Ht 66.0 in | Wt 176.5 lb

## 2016-04-12 DIAGNOSIS — R918 Other nonspecific abnormal finding of lung field: Secondary | ICD-10-CM

## 2016-04-12 DIAGNOSIS — I2699 Other pulmonary embolism without acute cor pulmonale: Secondary | ICD-10-CM | POA: Diagnosis not present

## 2016-04-12 DIAGNOSIS — J449 Chronic obstructive pulmonary disease, unspecified: Secondary | ICD-10-CM

## 2016-04-12 DIAGNOSIS — R911 Solitary pulmonary nodule: Secondary | ICD-10-CM

## 2016-04-12 NOTE — Assessment & Plan Note (Signed)
No inhaled medications at this time.

## 2016-04-12 NOTE — Progress Notes (Signed)
History of Present Illness Judy White is a 76 y.o. female current smoker with recent PE started on Pradaxa 10/07/15, COPD, HTN, paroxysmal-A.Fib, patient previously not on blood thinners despite CHADS vasc of 4 due to history of brain bleed. She has a Watchman device in her left atrial appendage.    HPI: 76 yo woman with hx A fib and watchman placement, on amiodarone but not anti-coag since 01/2015 due to concern regarding hx of SDH in 2007. Also w hx HTN, tobacco use and COPD. She was admitted with light-headedness, near-syncope 8/11, found to be hypoxemic. CT chest 8/11 showed scattered RLL PE's, some evidence for interstitial edema and GGI. LE doppler study showed RLE DVT. She was been started on therapeutic enoxaparin.and transitioned to Pradaxa at discharge. She was discharged home on oxygen. Amiodarone was stopped. She stopped smoking after the hospitalization.   She reports that she had a hospitalization beginning of October for atrial fibrillation. Her breathing has improved some. She has fatigue, no new resp sx. No new exposures. Not smoking. No mold exposure. No pets / no birds or bats.   She underwent a CT scan of her chest on 12/16/15 that I personally reviewed. This showed changes in the pre-existing groundglass inflammation that had been seen on her August CT scan. Much of this has cleared but now there is significant scattered more consolidated infiltrate and a loose nodular pattern. Most notable was in the lingula 3.9 x 2.2 cm.  Also noted was some possible right hilar lymphadenopathy.    ROV 01/03/16 -- the patient returns for evaluation of her history of pulmonary embolism, abnormal CT scan of the chest, former tobacco use.. As detailed above she has scattered loose nodular disease on CT scan that we will likely need BAL and biopsy. She is currently undergoing evaluation for initiation of Tykosyn and I would like for her to have this done before we performed bronchoscopy. She underwent  pulmonary function testing 12/31/15 and I personally reviewed the results. The spirometry shows mixed obstruction and restriction without a bronchodilator response. Lung volumes are restricted. Diffusion capacity is decreased and does not fully correct when adjusted for alveolar volume.   ROV 02/15/16 -- this follow-up visit for evaluation of an abnormal CT scan of the chest with scattered loose nodular disease noted on scan from hospitalization in August when she was diagnosed with pulmonary embolism. A follow-up scan 12/16/15 showed some changes but persistence of the scattered nodular consolidations. Based on this we performed a bronchoscopy on 01/24/16. BAL showed 92% neutrophils, WBC 900, cytology negative for malignancy, transbronchial biopsies negative for granulomas or malignancy. AFB and fungal cultures negative. Unfortunately course was complicated by a left-sided pneumothorax for which she required pigtail chest tube. She is currently on pradaxa, tolerating well. She is still planning to discuss possible Tikosyn with cardiology/ EP.  She is doing a trial of Spiriva for the last month - unsure whether it has helped her in any way, breathing feels the same.   ROV 04/12/16 -- is a follow-up visit for patient with a history of pulmonary malaise him and also an abnormal CT scan of the chest with some scattered loose nodular disease. Bronchoscopy 01/24/16 was nondiagnostic. As today to follow-up a repeat CT scan that was done on 04/05/16 that I personally reviewed. As a significant improvement in her previously identified areas of consolidation and groundglass. There are some residual areas that look like possible evolving scar. There is a 7 mm left lower lobe nodule  that is stable. She has adjusted her Pradaxa, decreased it, then changed to eliquis but only once a day.    Tests CT-PA 8/11 >> several RLL pulmonary emboli, evidence for pulmonary vascular congestion with some basilar more confluent GG  change LE doppler US 8/12 >> RLE DVT ECHO  10/09/2015>> PAP is 28 mm Hg   Review Of Systems: As per HPI   Vital Signs BP 120/74 (BP Location: Left Arm, Cuff Size: Normal)   Pulse 65   Ht 5\' 6"  (1.676 m)   Wt 176 lb 8 oz (80.1 kg)   SpO2 92%   BMI 28.49 kg/m   Vitals:   04/12/16 1507  BP: 120/74  Pulse: 65  SpO2: 92%  Weight: 176 lb 8 oz (80.1 kg)  Height: 5\' 6"  (1.676 m)   Physical Exam:  General- No distress,  A&Ox3, pleasant ENT: No sinus tenderness, TM clear, pale nasal mucosa, no oral exudate,no post nasal drip, no LAN Cardiac: S1, S2, regular rate and rhythm, no murmur Chest: No wheeze/ + crackles / dullness; no accessory muscle use, no nasal flaring, no sternal retractions Abd.: Soft Non-tender Ext: No clubbing cyanosis, edema Neuro:  normal strength Skin: No rashes, warm and dry Psych: normal mood and behavior   Assessment/Plan  COPD GOLD 0  No inhaled medications at this time.   Pulmonary embolism ([place]) She has been treated for about 5 months, decreased her anticoagulation on her own about a month ago. At this point we can stop the eliquis. I will restart the ASA 81mg  per Dr Thompson Caul prior plans.   Pulmonary infiltrates on CXR Markedly improved on CT scan of the chest. I suspect in absence of an infectious etiology that this was related to amiodarone. She has a single pulmonary nodule in the LLL that should be followed in 1 year    Baltazar Apo, MD, PhD 04/12/2016, 3:38 PM [facility] [phone] or if no answer [phone]

## 2016-04-12 NOTE — Assessment & Plan Note (Signed)
Markedly improved on CT scan of the chest. I suspect in absence of an infectious etiology that this was related to amiodarone. She has a single pulmonary nodule in the LLL that should be followed in 1 year

## 2016-04-12 NOTE — Patient Instructions (Addendum)
Your CT chest is significantly improved compared with October 2017. This is great news.  We can stop your Eliquis now.  Restart your Asprin 81mg  daily You will need a repeat Ct scan of your chest in February 2019 to follow your pulmonary nodule.  Follow with Dr Lamonte Sakai in 12 months or sooner if you have any problems

## 2016-04-12 NOTE — Assessment & Plan Note (Signed)
She has been treated for about 5 months, decreased her anticoagulation on her own about a month ago. At this point we can stop the eliquis. I will restart the ASA 81mg  per Dr Thompson Caul prior plans.

## 2016-04-17 ENCOUNTER — Telehealth (HOSPITAL_COMMUNITY): Payer: Self-pay | Admitting: *Deleted

## 2016-04-28 ENCOUNTER — Encounter: Payer: Self-pay | Admitting: Interventional Cardiology

## 2016-07-03 DIAGNOSIS — I83813 Varicose veins of bilateral lower extremities with pain: Secondary | ICD-10-CM | POA: Diagnosis not present

## 2016-07-03 DIAGNOSIS — I480 Paroxysmal atrial fibrillation: Secondary | ICD-10-CM | POA: Diagnosis not present

## 2016-07-03 DIAGNOSIS — I2699 Other pulmonary embolism without acute cor pulmonale: Secondary | ICD-10-CM | POA: Diagnosis not present

## 2016-07-03 DIAGNOSIS — I10 Essential (primary) hypertension: Secondary | ICD-10-CM | POA: Diagnosis not present

## 2016-07-03 DIAGNOSIS — E559 Vitamin D deficiency, unspecified: Secondary | ICD-10-CM | POA: Diagnosis not present

## 2016-07-03 DIAGNOSIS — L659 Nonscarring hair loss, unspecified: Secondary | ICD-10-CM | POA: Diagnosis not present

## 2016-07-03 DIAGNOSIS — E782 Mixed hyperlipidemia: Secondary | ICD-10-CM | POA: Diagnosis not present

## 2016-07-05 DIAGNOSIS — I8311 Varicose veins of right lower extremity with inflammation: Secondary | ICD-10-CM | POA: Diagnosis not present

## 2016-07-05 DIAGNOSIS — I8312 Varicose veins of left lower extremity with inflammation: Secondary | ICD-10-CM | POA: Diagnosis not present

## 2016-07-10 DIAGNOSIS — I8312 Varicose veins of left lower extremity with inflammation: Secondary | ICD-10-CM | POA: Diagnosis not present

## 2016-07-10 DIAGNOSIS — I8311 Varicose veins of right lower extremity with inflammation: Secondary | ICD-10-CM | POA: Diagnosis not present

## 2016-07-15 NOTE — Progress Notes (Signed)
Cardiology Office Note    Date:  07/17/2016   ID:  Judy White, DOB 10-15-1940, MRN [id number]  PCP:  Josetta Huddle, MD  Cardiologist: Sinclair Grooms, MD   Chief Complaint  Patient presents with  . Atrial Fibrillation    History of Present Illness:  Judy White is a 76 y.o. female who presents for paroxysmal atrial fibrillation, amiodarone therapy discontinued due to possible pulmonary toxicity, diastolic heart failure, unable to use anticoagulation induced intracranial hemorrhage, successful Watchman device 2017, essential hypertension,and anxiety disorder. Was  be start on Tikosyn but there was process difficulty with the patient eventually refusing therapy.  Generally speaking, Judy White has done well. She denies orthopnea and PND. There is no peripheral edema. No recent prolonged palpitations. She denies transient neurological symptoms. She denies anginal quality chest pain. Exertional tolerance is been stable.  Past Medical History:  Diagnosis Date  . Agoraphobia   . Anxiety   . Arthritis    "normal for the age; nothing gives me problems" (01/24/2016)  . COPD (chronic obstructive pulmonary disease) ([place])    "mild" (01/24/2016)  . Depressive disorder, not elsewhere classified   . DVT (deep venous thrombosis) ([place]) 10/08/2015   "they said the blood clot went from one of my legs to my lung"  . History of hiatal hernia    "small one" (01/24/2016)  . Hypercholesteremia   . Hypertension    pt denies this hx on 01/24/2016  . Myalgia and myositis, unspecified   . Osteopenia   . Overactive bladder   . Paroxysmal atrial fibrillation (HCC)    a. s/p Watchman implant 12/2014  . Pulmonary embolism ([place]) 10/08/2015    Past Surgical History:  Procedure Laterality Date  . APPENDECTOMY  1983  . BACK SURGERY    . BRONCHOSCOPY  01/24/2016  . CARDIAC CATHETERIZATION     >5 yrs  . CATARACT EXTRACTION W/ INTRAOCULAR LENS  IMPLANT, BILATERAL Bilateral   . CESAREAN SECTION  1967    . LEFT ATRIAL APPENDAGE OCCLUSION N/A 12/31/2014   Procedure: LEFT ATRIAL APPENDAGE OCCLUSION;  Surgeon: Thompson Grayer, MD;  Location: [facility];  Service: Cardiovascular;  Laterality: N/A;  . POSTERIOR FUSION LUMBAR SPINE Bilateral 07/2014  . SHOULDER ARTHROSCOPY W/ ROTATOR CUFF REPAIR Left   . SHOULDER SURGERY Right    "tendon broke; couldn't be repaired"  . TEE WITHOUT CARDIOVERSION N/A 12/22/2014   Procedure: TRANSESOPHAGEAL ECHOCARDIOGRAM (TEE);  Surgeon: Pixie Casino, MD;  [facility] ENDOSCOPY;  Service: Cardiovascular;  Laterality: N/A;  . TEE WITHOUT CARDIOVERSION N/A 02/16/2015   post Watchman TEE with good seal and no leak around device   . TONSILLECTOMY    . TOTAL ABDOMINAL HYSTERECTOMY    . VIDEO BRONCHOSCOPY Bilateral 01/24/2016   Procedure: VIDEO BRONCHOSCOPY WITH FLUORO;  Surgeon: Collene Gobble, MD;  Location: [facility];  Service: Cardiopulmonary;  Laterality: Bilateral;  . WISDOM TOOTH EXTRACTION      Current Medications: Outpatient Medications Prior to Visit  Medication Sig Dispense Refill  . acetaminophen (TYLENOL) 500 MG tablet Take 500 mg by mouth every 6 (six) hours as needed for moderate pain or headache.    . Calcium-Vitamin D (CALTRATE 600 PLUS-VIT D PO) Take 1 tablet by mouth daily.     . cholecalciferol (VITAMIN D) 1000 UNITS tablet Take 1,000 Units by mouth daily.    Marland Kitchen FLUoxetine (PROZAC) 20 MG capsule Take 20 mg by mouth daily.     . furosemide (LASIX) 20  MG tablet Take 20 mg by mouth daily.    Marland Kitchen oxybutynin (DITROPAN-XL) 10 MG 24 hr tablet Take 10 mg by mouth daily.     . Potassium 99 MG TABS Take 99 mg by mouth daily.     . vitamin B-12 (CYANOCOBALAMIN) 1000 MCG tablet Take 1,000 mcg by mouth daily.    . vitamin C (ASCORBIC ACID) 500 MG tablet Take 500 mg by mouth daily.    . clonazePAM (KLONOPIN) 1 MG tablet Take 0.5-1.5 mg by mouth See admin instructions. Take 1 1/2 tablets (1.5 mg) by mouth every morning, may also take 1/2 tablet (0.5 mg)  in the afternoon as needed for panic attacks    . Omega-3 Fatty Acids (FISH OIL) 1000 MG CAPS Take 1,000 mg by mouth daily.     No facility-administered medications prior to visit.      Allergies:   Patient has no known allergies.   Social History   Social History  . Marital status: Married    Spouse name: N/A  . Number of children: N/A  . Years of education: N/A   Social History Main Topics  . Smoking status: Former Smoker    Packs/day: 0.50    Years: 60.00    Types: Cigarettes    Quit date: 10/08/2015  . Smokeless tobacco: Never Used  . Alcohol use 0.0 oz/week     Comment: 01/24/2016 "don't drink at all right now cause of my RX; social drinker before now"  . Drug use: No  . Sexual activity: No   Other Topics Concern  . None   Social History Narrative  . None     Family History:  The patient's family history includes Aneurysm in her father; Heart disease in her brother; Hypotension in her mother; Neuropathy in her brother.   ROS:   Please see the history of present illness.    Depression, occasional hematuria, back pain, right knee discomfort, easy bruising, difficulty with balance, anxiety, irregular heartbeat, and leg pain.  All other systems reviewed and are negative.   PHYSICAL EXAM:   VS:  BP (!) 100/58 (BP Location: Left Arm)   Pulse 71   Ht 5\' 6"  (1.676 m)   Wt 182 lb (82.6 kg)   BMI 29.38 kg/m    GEN: Well nourished, well developed, in no acute distress  HEENT: normal  Neck: no JVD, carotid bruits, or masses Cardiac: RRR; no murmurs, rubs, or gallops,no edema  Respiratory:  clear to auscultation bilaterally, normal work of breathing GI: soft, nontender, nondistended, + BS MS: no deformity or atrophy  Skin: warm and dry, no rash Neuro:  Alert and Oriented x 3, Strength and sensation are intact Psych: euthymic mood, full affect  Wt Readings from Last 3 Encounters:  07/17/16 182 lb (82.6 kg)  07/17/16 182 lb (82.6 kg)  04/12/16 176 lb 8 oz (80.1  kg)      Studies/Labs Reviewed:   EKG:  EKG  Not repeated.  Recent Labs: 12/02/2015: ALT 13; B Natriuretic Peptide 375.6 12/03/2015: TSH 3.313 01/11/2016: BUN 21; Creatinine, Ser 1.21; Magnesium 1.9; Potassium 4.2; Sodium 135 01/31/2016: Hemoglobin 14.5; Platelets 279.0   Lipid Panel No results found for: CHOL, TRIG, HDL, CHOLHDL, VLDL, LDLCALC, LDLDIRECT  Additional studies/ records that were reviewed today include:  None    ASSESSMENT:    1. Paroxysmal atrial fibrillation (HCC)   2. Chronic diastolic heart failure ([place])   3. Essential hypertension   4. COPD GOLD 0    5.  On amiodarone therapy      PLAN:  In order of problems listed above:  1. Not currently on anticoagulation therapy or antiarrhythmic therapy. She has undergone successful Watchman implantation and is on aspirin daily. Generally speaking, symptomatic atrial fibrillation has been very rare over the past several months. 2. This problem is usually associated with prolonged episodes of atrial fibrillation. She is currently clinically in sinus rhythm. There is no evidence of volume overload. No change in therapy is needed. 3. Blood pressure is well controlled. No changes needed. 4. Followed by Dr. West Carbo. 5. Pulmonary toxicity led to medication discontinuation.  Clinical follow-up in 6-9 months. No change in current medical regimen. Call if swelling, increasing dyspnea, or prolonged palpitations.  Medication Adjustments/Labs and Tests Ordered: Current medicines are reviewed at length with the patient today.  Concerns regarding medicines are outlined above.  Medication changes, Labs and Tests ordered today are listed in the Patient Instructions below. Patient Instructions  Medication Instructions:  Your physician recommends that you continue on your current medications as directed. Please refer to the Current Medication list given to you today.  Labwork: None ordered  Testing/Procedures: None  ordered  Follow-Up: Your physician wants you to follow-up in 9 months with Dr. Tamala Julian. You will receive a reminder letter in the mail two months in advance. If you don't receive a letter, please call our office to schedule the follow-up appointment.   Any Other Special Instructions Will Be Listed Below (If Applicable).     If you need a refill on your cardiac medications before your next appointment, please call your pharmacy.      Signed, Sinclair Grooms, MD  07/17/2016 11:17 AM    [facility] [address], [place], [place]  [address] Phone: [phone]; Fax: [phone]

## 2016-07-17 ENCOUNTER — Encounter: Payer: Self-pay | Admitting: Interventional Cardiology

## 2016-07-17 ENCOUNTER — Ambulatory Visit (INDEPENDENT_AMBULATORY_CARE_PROVIDER_SITE_OTHER): Payer: Medicare Other | Admitting: Internal Medicine

## 2016-07-17 ENCOUNTER — Ambulatory Visit (INDEPENDENT_AMBULATORY_CARE_PROVIDER_SITE_OTHER): Payer: Medicare Other | Admitting: Interventional Cardiology

## 2016-07-17 VITALS — BP 110/58 | HR 65 | Ht 66.0 in | Wt 182.0 lb

## 2016-07-17 VITALS — BP 100/58 | HR 71 | Ht 66.0 in | Wt 182.0 lb

## 2016-07-17 DIAGNOSIS — I480 Paroxysmal atrial fibrillation: Secondary | ICD-10-CM

## 2016-07-17 DIAGNOSIS — J449 Chronic obstructive pulmonary disease, unspecified: Secondary | ICD-10-CM | POA: Diagnosis not present

## 2016-07-17 DIAGNOSIS — I5032 Chronic diastolic (congestive) heart failure: Secondary | ICD-10-CM

## 2016-07-17 DIAGNOSIS — Z79899 Other long term (current) drug therapy: Secondary | ICD-10-CM | POA: Diagnosis not present

## 2016-07-17 DIAGNOSIS — I10 Essential (primary) hypertension: Secondary | ICD-10-CM

## 2016-07-17 MED ORDER — ASPIRIN EC 325 MG PO TBEC: 325.0000 mg | DELAYED_RELEASE_TABLET | Freq: Every day | ORAL | 0 refills | Status: DC

## 2016-07-17 NOTE — Patient Instructions (Signed)
Medication Instructions:  Your physician has recommended you make the following change in your medication:  1) Increase Aspirin to 325 mg daily   Labwork: None ordered   Testing/Procedures: None ordered   Follow-Up: Your physician wants you to follow-up in: 6 months with Dr Rayann Heman Dennis Bast will receive a reminder letter in the mail two months in advance. If you don't receive a letter, please call our office to schedule the follow-up appointment.   Any Other Special Instructions Will Be Listed Below (If Applicable).     If you need a refill on your cardiac medications before your next appointment, please call your pharmacy.

## 2016-07-17 NOTE — Progress Notes (Signed)
PCP: Josetta Huddle, MD Primary Cardiologist:  Judy White is a 76 y.o. female who presents today for routine electrophysiology followup.  Today, she denies symptoms of palpitations, chest pain, shortness of breath,  lower extremity edema, dizziness, presyncope, or syncope.  The patient is otherwise without complaint today.   She has had intermittent afib, worse 6 months ago, but is currently pleased with her low afib burden.  She had PTE 8/17 and was on pradaxa.  She states that Judy White has since stopped her pradaxa.  She has had no recent issues with bleeding.  Past Medical History:  Diagnosis Date  . Agoraphobia   . Anxiety   . Arthritis    "normal for the age; nothing gives me problems" (01/24/2016)  . COPD (chronic obstructive pulmonary disease) ([place])    "mild" (01/24/2016)  . Depressive disorder, not elsewhere classified   . DVT (deep venous thrombosis) ([place]) 10/08/2015   "they said the blood clot went from one of my legs to my lung"  . History of hiatal hernia    "small one" (01/24/2016)  . Hypercholesteremia   . Hypertension    pt denies this hx on 01/24/2016  . Myalgia and myositis, unspecified   . Osteopenia   . Overactive bladder   . Paroxysmal atrial fibrillation (HCC)    a. s/p Watchman implant 12/2014  . Pulmonary embolism ([place]) 10/08/2015   Past Surgical History:  Procedure Laterality Date  . APPENDECTOMY  1983  . BACK SURGERY    . BRONCHOSCOPY  01/24/2016  . CARDIAC CATHETERIZATION     >5 yrs  . CATARACT EXTRACTION W/ INTRAOCULAR LENS  IMPLANT, BILATERAL Bilateral   . CESAREAN SECTION  1967  . LEFT ATRIAL APPENDAGE OCCLUSION N/A 12/31/2014   Procedure: LEFT ATRIAL APPENDAGE OCCLUSION;  Surgeon: Thompson Grayer, MD;  Location: [facility];  Service: Cardiovascular;  Laterality: N/A;  . POSTERIOR FUSION LUMBAR SPINE Bilateral 07/2014  . SHOULDER ARTHROSCOPY W/ ROTATOR CUFF REPAIR Left   . SHOULDER SURGERY Right    "tendon broke; couldn't be  repaired"  . TEE WITHOUT CARDIOVERSION N/A 12/22/2014   Procedure: TRANSESOPHAGEAL ECHOCARDIOGRAM (TEE);  Surgeon: Pixie Casino, MD;  Location: [facility] ENDOSCOPY;  Service: Cardiovascular;  Laterality: N/A;  . TEE WITHOUT CARDIOVERSION N/A 02/16/2015   post Watchman TEE with good seal and no leak around device   . TONSILLECTOMY    . TOTAL ABDOMINAL HYSTERECTOMY    . VIDEO BRONCHOSCOPY Bilateral 01/24/2016   Procedure: VIDEO BRONCHOSCOPY WITH FLUORO;  Surgeon: Collene Gobble, MD;  Location: [facility];  Service: Cardiopulmonary;  Laterality: Bilateral;  . WISDOM TOOTH EXTRACTION      ROS- all systems are reviewed and negatives except as per HPI above  Current Outpatient Prescriptions  Medication Sig Dispense Refill  . acetaminophen (TYLENOL) 500 MG tablet Take 500 mg by mouth every 6 (six) hours as needed for moderate pain or headache.    Marland Kitchen aspirin 81 MG tablet Take 81 mg by mouth daily.    . Biotin 10000 MCG TABS Take 1 tablet by mouth daily.    . Calcium-Vitamin D (CALTRATE 600 PLUS-VIT D PO) Take 1 tablet by mouth daily.     . cholecalciferol (VITAMIN D) 1000 UNITS tablet Take 1,000 Units by mouth daily.    . clonazePAM (KLONOPIN) 1 MG tablet Take 1.5 mg to 2 mg by mouth daily.    Marland Kitchen FLUoxetine (PROZAC) 20 MG capsule Take 20 mg by mouth daily.     Marland Kitchen  furosemide (LASIX) 20 MG tablet Take 20 mg by mouth daily.    Marland Kitchen oxybutynin (DITROPAN-XL) 10 MG 24 hr tablet Take 10 mg by mouth daily.     . Potassium 99 MG TABS Take 99 mg by mouth daily.     . vitamin B-12 (CYANOCOBALAMIN) 1000 MCG tablet Take 1,000 mcg by mouth daily.    . vitamin C (ASCORBIC ACID) 500 MG tablet Take 500 mg by mouth daily.     No current facility-administered medications for this visit.     Physical Exam: Vitals:   07/17/16 1041  BP: (!) 110/58  Pulse: 65  SpO2: 96%  Weight: 182 lb (82.6 kg)  Height: 5\' 6"  (1.676 m)    GEN- The patient is well appearing, alert and oriented x 3 today.   Head- normocephalic,  atraumatic Eyes-  Sclera clear, conjunctiva pink Ears- hearing intact Oropharynx- clear Lungs- Clear to ausculation bilaterally, normal work of breathing Heart- Regular rate and rhythm, no murmurs, rubs or gallops, PMI not laterally displaced GI- soft, NT, ND, + BS Extremities- no clubbing, cyanosis, or edema  ekg today reveals sinus rhythm 65 bpm, RAA, septal infarct  Assessment and Plan:  1. afib Well controlled off of AAD therapy currently Consider tikosyn if she has further afib (she would like to avoid if possibly) Increase ASA to 325mg  daily s/p Watchman  Follow-up with Judy White as scheduled I will see her again in 6 months  Thompson Grayer MD, [facility] 07/17/2016 11:58 AM

## 2016-07-17 NOTE — Patient Instructions (Signed)
Medication Instructions:  Your physician recommends that you continue on your current medications as directed. Please refer to the Current Medication list given to you today.   Labwork: None ordered  Testing/Procedures: None ordered  Follow-Up: Your physician wants you to follow-up in: 9 months with Dr.Smith You will receive a reminder letter in the mail two months in advance. If you don't receive a letter, please call our office to schedule the follow-up appointment.   Any Other Special Instructions Will Be Listed Below (If Applicable).     If you need a refill on your cardiac medications before your next appointment, please call your pharmacy.   

## 2016-07-21 DIAGNOSIS — D225 Melanocytic nevi of trunk: Secondary | ICD-10-CM | POA: Diagnosis not present

## 2016-07-21 DIAGNOSIS — L570 Actinic keratosis: Secondary | ICD-10-CM | POA: Diagnosis not present

## 2016-07-21 DIAGNOSIS — L820 Inflamed seborrheic keratosis: Secondary | ICD-10-CM | POA: Diagnosis not present

## 2016-07-21 DIAGNOSIS — I83893 Varicose veins of bilateral lower extremities with other complications: Secondary | ICD-10-CM | POA: Diagnosis not present

## 2016-07-21 DIAGNOSIS — D1801 Hemangioma of skin and subcutaneous tissue: Secondary | ICD-10-CM | POA: Diagnosis not present

## 2016-07-21 DIAGNOSIS — I8312 Varicose veins of left lower extremity with inflammation: Secondary | ICD-10-CM | POA: Diagnosis not present

## 2016-07-21 DIAGNOSIS — D692 Other nonthrombocytopenic purpura: Secondary | ICD-10-CM | POA: Diagnosis not present

## 2016-07-21 DIAGNOSIS — C44319 Basal cell carcinoma of skin of other parts of face: Secondary | ICD-10-CM | POA: Diagnosis not present

## 2016-07-21 DIAGNOSIS — L821 Other seborrheic keratosis: Secondary | ICD-10-CM | POA: Diagnosis not present

## 2016-07-21 DIAGNOSIS — D485 Neoplasm of uncertain behavior of skin: Secondary | ICD-10-CM | POA: Diagnosis not present

## 2016-07-21 DIAGNOSIS — L814 Other melanin hyperpigmentation: Secondary | ICD-10-CM | POA: Diagnosis not present

## 2016-07-21 DIAGNOSIS — I8311 Varicose veins of right lower extremity with inflammation: Secondary | ICD-10-CM | POA: Diagnosis not present

## 2016-07-21 DIAGNOSIS — C4401 Basal cell carcinoma of skin of lip: Secondary | ICD-10-CM | POA: Diagnosis not present

## 2016-08-11 DIAGNOSIS — I10 Essential (primary) hypertension: Secondary | ICD-10-CM | POA: Diagnosis not present

## 2016-08-11 DIAGNOSIS — I83813 Varicose veins of bilateral lower extremities with pain: Secondary | ICD-10-CM | POA: Diagnosis not present

## 2016-08-11 DIAGNOSIS — Z79899 Other long term (current) drug therapy: Secondary | ICD-10-CM | POA: Diagnosis not present

## 2016-08-11 DIAGNOSIS — M179 Osteoarthritis of knee, unspecified: Secondary | ICD-10-CM | POA: Diagnosis not present

## 2016-08-11 DIAGNOSIS — L659 Nonscarring hair loss, unspecified: Secondary | ICD-10-CM | POA: Diagnosis not present

## 2016-08-11 DIAGNOSIS — Z0000 Encounter for general adult medical examination without abnormal findings: Secondary | ICD-10-CM | POA: Diagnosis not present

## 2016-08-11 DIAGNOSIS — Z13820 Encounter for screening for osteoporosis: Secondary | ICD-10-CM | POA: Diagnosis not present

## 2016-08-11 DIAGNOSIS — Z1389 Encounter for screening for other disorder: Secondary | ICD-10-CM | POA: Diagnosis not present

## 2016-08-11 DIAGNOSIS — I2699 Other pulmonary embolism without acute cor pulmonale: Secondary | ICD-10-CM | POA: Diagnosis not present

## 2016-08-11 DIAGNOSIS — F325 Major depressive disorder, single episode, in full remission: Secondary | ICD-10-CM | POA: Diagnosis not present

## 2016-08-11 DIAGNOSIS — E782 Mixed hyperlipidemia: Secondary | ICD-10-CM | POA: Diagnosis not present

## 2016-08-11 DIAGNOSIS — F329 Major depressive disorder, single episode, unspecified: Secondary | ICD-10-CM | POA: Diagnosis not present

## 2016-08-11 DIAGNOSIS — E559 Vitamin D deficiency, unspecified: Secondary | ICD-10-CM | POA: Diagnosis not present

## 2016-08-11 DIAGNOSIS — I480 Paroxysmal atrial fibrillation: Secondary | ICD-10-CM | POA: Diagnosis not present

## 2016-08-21 DIAGNOSIS — I83893 Varicose veins of bilateral lower extremities with other complications: Secondary | ICD-10-CM | POA: Diagnosis not present

## 2016-09-04 DIAGNOSIS — M8588 Other specified disorders of bone density and structure, other site: Secondary | ICD-10-CM | POA: Diagnosis not present

## 2016-09-04 DIAGNOSIS — M810 Age-related osteoporosis without current pathological fracture: Secondary | ICD-10-CM | POA: Diagnosis not present

## 2016-09-04 DIAGNOSIS — F064 Anxiety disorder due to known physiological condition: Secondary | ICD-10-CM | POA: Diagnosis not present

## 2016-09-11 DIAGNOSIS — I83891 Varicose veins of right lower extremities with other complications: Secondary | ICD-10-CM | POA: Diagnosis not present

## 2016-09-11 DIAGNOSIS — I8311 Varicose veins of right lower extremity with inflammation: Secondary | ICD-10-CM | POA: Diagnosis not present

## 2016-09-11 DIAGNOSIS — I83811 Varicose veins of right lower extremities with pain: Secondary | ICD-10-CM | POA: Diagnosis not present

## 2016-09-13 DIAGNOSIS — I8311 Varicose veins of right lower extremity with inflammation: Secondary | ICD-10-CM | POA: Diagnosis not present

## 2016-09-15 DIAGNOSIS — Z85828 Personal history of other malignant neoplasm of skin: Secondary | ICD-10-CM | POA: Diagnosis not present

## 2016-09-15 DIAGNOSIS — C4401 Basal cell carcinoma of skin of lip: Secondary | ICD-10-CM | POA: Diagnosis not present

## 2016-09-27 DIAGNOSIS — I83891 Varicose veins of right lower extremities with other complications: Secondary | ICD-10-CM | POA: Diagnosis not present

## 2016-09-27 DIAGNOSIS — I8311 Varicose veins of right lower extremity with inflammation: Secondary | ICD-10-CM | POA: Diagnosis not present

## 2016-10-11 DIAGNOSIS — H5213 Myopia, bilateral: Secondary | ICD-10-CM | POA: Diagnosis not present

## 2016-10-11 DIAGNOSIS — Z961 Presence of intraocular lens: Secondary | ICD-10-CM | POA: Diagnosis not present

## 2016-10-17 DIAGNOSIS — I8311 Varicose veins of right lower extremity with inflammation: Secondary | ICD-10-CM | POA: Diagnosis not present

## 2016-11-06 DIAGNOSIS — M1711 Unilateral primary osteoarthritis, right knee: Secondary | ICD-10-CM | POA: Diagnosis not present

## 2016-11-26 DIAGNOSIS — I10 Essential (primary) hypertension: Secondary | ICD-10-CM | POA: Diagnosis not present

## 2016-11-26 DIAGNOSIS — F325 Major depressive disorder, single episode, in full remission: Secondary | ICD-10-CM | POA: Diagnosis not present

## 2016-11-26 DIAGNOSIS — F329 Major depressive disorder, single episode, unspecified: Secondary | ICD-10-CM | POA: Diagnosis not present

## 2016-11-26 DIAGNOSIS — I480 Paroxysmal atrial fibrillation: Secondary | ICD-10-CM | POA: Diagnosis not present

## 2016-11-26 DIAGNOSIS — E782 Mixed hyperlipidemia: Secondary | ICD-10-CM | POA: Diagnosis not present

## 2016-11-26 DIAGNOSIS — M179 Osteoarthritis of knee, unspecified: Secondary | ICD-10-CM | POA: Diagnosis not present

## 2016-12-06 DIAGNOSIS — M25561 Pain in right knee: Secondary | ICD-10-CM | POA: Diagnosis not present

## 2016-12-15 ENCOUNTER — Encounter: Payer: Self-pay | Admitting: *Deleted

## 2016-12-18 ENCOUNTER — Other Ambulatory Visit: Payer: Self-pay | Admitting: Interventional Cardiology

## 2016-12-25 DIAGNOSIS — M1711 Unilateral primary osteoarthritis, right knee: Secondary | ICD-10-CM | POA: Diagnosis not present

## 2017-01-01 ENCOUNTER — Ambulatory Visit (INDEPENDENT_AMBULATORY_CARE_PROVIDER_SITE_OTHER): Payer: Medicare Other | Admitting: Internal Medicine

## 2017-01-01 ENCOUNTER — Encounter: Payer: Self-pay | Admitting: Internal Medicine

## 2017-01-01 VITALS — BP 86/66 | HR 142 | Ht 65.25 in | Wt 188.0 lb

## 2017-01-01 DIAGNOSIS — I480 Paroxysmal atrial fibrillation: Secondary | ICD-10-CM

## 2017-01-01 DIAGNOSIS — M1711 Unilateral primary osteoarthritis, right knee: Secondary | ICD-10-CM | POA: Diagnosis not present

## 2017-01-01 MED ORDER — DILTIAZEM HCL ER COATED BEADS 120 MG PO CP24: 120.0000 mg | ORAL_CAPSULE | Freq: Every day | ORAL | 3 refills | Status: DC

## 2017-01-01 NOTE — Patient Instructions (Signed)
Medication Instructions:   Your physician has recommended you make the following change in your medication:   1.)  Increase ASA to 325 mg daily  2.)  Start Diltiazem 120 mg daily  -- If you need a refill on your cardiac medications before your next appointment, please call your pharmacy. --  Labwork: None ordered  Testing/Procedures: None ordered  Follow-Up: Your physician wants you to follow-up in: 3 days (11/8) with Roderic Palau in Afib clinic for an EKG.  You will receive a reminder letter in the mail two months in advance. If you don't receive a letter, please call our office to schedule the follow-up appointment.  Thank you for choosing [facility]!!   Frederik Schmidt, RN [phone]  Any Other Special Instructions Will Be Listed Below (If Applicable).

## 2017-01-01 NOTE — Progress Notes (Signed)
PCP: Josetta Huddle, MD Primary Cardiologist: Dr Tamala Julian Primary EP: Dr Odette Horns Aziz is a 76 y.o. female who presents today for routine electrophysiology followup.  Since last being seen in our clinic, the patient reports doing very well.  She is in atrial flutter today with RVR but unaware.  Energy is good.  Denies palpitations. Today, she denies symptoms of chest pain, shortness of breath,  lower extremity edema, dizziness, presyncope, or syncope.  The patient is otherwise without complaint today.   Past Medical History:  Diagnosis Date  . Agoraphobia   . Anxiety   . Arthritis    "normal for the age; nothing gives me problems" (01/24/2016)  . COPD (chronic obstructive pulmonary disease) ([place])    "mild" (01/24/2016)  . Depressive disorder, not elsewhere classified   . DVT (deep venous thrombosis) ([place]) 10/08/2015   "they said the blood clot went from one of my legs to my lung"  . History of hiatal hernia    "small one" (01/24/2016)  . Hypercholesteremia   . Hypertension    pt denies this hx on 01/24/2016  . Myalgia and myositis, unspecified   . Osteopenia   . Overactive bladder   . Paroxysmal atrial fibrillation (HCC)    a. s/p Watchman implant 12/2014  . Pulmonary embolism ([place]) 10/08/2015   Past Surgical History:  Procedure Laterality Date  . APPENDECTOMY  1983  . BACK SURGERY    . BRONCHOSCOPY  01/24/2016  . CARDIAC CATHETERIZATION     >5 yrs  . CATARACT EXTRACTION W/ INTRAOCULAR LENS  IMPLANT, BILATERAL Bilateral   . CESAREAN SECTION  1967  . POSTERIOR FUSION LUMBAR SPINE Bilateral 07/2014  . SHOULDER ARTHROSCOPY W/ ROTATOR CUFF REPAIR Left   . SHOULDER SURGERY Right    "tendon broke; couldn't be repaired"  . TONSILLECTOMY    . TOTAL ABDOMINAL HYSTERECTOMY    . WISDOM TOOTH EXTRACTION      ROS- all systems are reviewed and negatives except as per HPI above  Current Outpatient Medications  Medication Sig Dispense Refill  . acetaminophen (TYLENOL) 500 MG  tablet Take 500 mg by mouth every 6 (six) hours as needed for moderate pain or headache.    Marland Kitchen aspirin EC 81 MG tablet Take 162 mg daily by mouth.    . Calcium-Vitamin D (CALTRATE 600 PLUS-VIT D PO) Take 1 tablet by mouth daily.     . cholecalciferol (VITAMIN D) 1000 UNITS tablet Take 1,000 Units by mouth daily.    . clonazePAM (KLONOPIN) 1 MG tablet Take 1.5 mg to 2 mg by mouth daily.    Marland Kitchen FLUoxetine (PROZAC) 20 MG capsule Take 20 mg by mouth daily.     . furosemide (LASIX) 20 MG tablet TAKE 1 TABLET ONCE DAILY. 90 tablet 1  . oxybutynin (DITROPAN-XL) 10 MG 24 hr tablet Take 10 mg by mouth daily.     . Potassium 99 MG TABS Take 99 mg by mouth daily.     . vitamin B-12 (CYANOCOBALAMIN) 1000 MCG tablet Take 1,000 mcg by mouth daily.    . vitamin C (ASCORBIC ACID) 500 MG tablet Take 500 mg by mouth daily.     No current facility-administered medications for this visit.     Physical Exam: Vitals:   01/01/17 1458  BP: (!) 86/66  Pulse: (!) 142  SpO2: 96%  Weight: 188 lb (85.3 kg)  Height: 5' 5.25" (1.657 m)    GEN- The patient is well appearing, alert and oriented x  3 today.   Head- normocephalic, atraumatic Eyes-  Sclera clear, conjunctiva pink Ears- hearing intact Oropharynx- clear Lungs- Clear to ausculation bilaterally, normal work of breathing Heart- tachycardic irregular rhythm, no murmurs, rubs or gallops, PMI not laterally displaced GI- soft, NT, ND, + BS Extremities- no clubbing, cyanosis, or edema  EKG tracing ordered today is personally reviewed and shows atypical atrial flutter with RVR, V rate 142 bpm  Assessment and Plan:  1. Persistent afib/ atypical atrial flutter S/p Watchman On ASA.  I have increased to 325mg  daily which is the advised dose for Watchman Needs better rate control.  I have added diltiazem CD 120mg  daily today She will return to the AF clinic on Thursday.  If still in AFlutter, would advised increasing diltiazem CD to 240mg  daily.  Could consider  cardioversion and/or tikosyn if still in atrial flutter at that time, however this would require initiation of anticoagulation x 3 weeks or TEE after 3 doses.  2. Chronic diastolic dysfunction Stable No change required today  3. HTN Stable No change required today  Follow-up in the AF clinic later this week She is at risk for decompensation/ hospitalization.  She is very tachycardic and hypotensive.  I have advised that if she has presyncope, CP, or SOB that she call 911 and report to the ED.  A high level of decision making is required for this encounter.  Thompson Grayer MD, [facility] 01/01/2017 3:22 PM

## 2017-01-02 DIAGNOSIS — F3341 Major depressive disorder, recurrent, in partial remission: Secondary | ICD-10-CM | POA: Diagnosis not present

## 2017-01-03 ENCOUNTER — Encounter (HOSPITAL_COMMUNITY): Payer: Self-pay

## 2017-01-03 ENCOUNTER — Other Ambulatory Visit: Payer: Self-pay

## 2017-01-03 ENCOUNTER — Emergency Department (HOSPITAL_COMMUNITY): Payer: Medicare Other

## 2017-01-03 ENCOUNTER — Telehealth: Payer: Self-pay | Admitting: Internal Medicine

## 2017-01-03 ENCOUNTER — Inpatient Hospital Stay (HOSPITAL_COMMUNITY)
Admission: EM | Admit: 2017-01-03 | Discharge: 2017-01-07 | DRG: 309 | Disposition: A | Discharge status: Home / Self Care | Payer: Medicare Other | Attending: Internal Medicine | Admitting: Internal Medicine

## 2017-01-03 DIAGNOSIS — J449 Chronic obstructive pulmonary disease, unspecified: Secondary | ICD-10-CM | POA: Diagnosis not present

## 2017-01-03 DIAGNOSIS — I4892 Unspecified atrial flutter: Secondary | ICD-10-CM | POA: Diagnosis not present

## 2017-01-03 DIAGNOSIS — I484 Atypical atrial flutter: Secondary | ICD-10-CM | POA: Diagnosis present

## 2017-01-03 DIAGNOSIS — Z87891 Personal history of nicotine dependence: Secondary | ICD-10-CM | POA: Diagnosis not present

## 2017-01-03 DIAGNOSIS — I4891 Unspecified atrial fibrillation: Secondary | ICD-10-CM | POA: Diagnosis not present

## 2017-01-03 DIAGNOSIS — R0602 Shortness of breath: Secondary | ICD-10-CM | POA: Diagnosis not present

## 2017-01-03 DIAGNOSIS — F4000 Agoraphobia, unspecified: Secondary | ICD-10-CM | POA: Diagnosis not present

## 2017-01-03 DIAGNOSIS — Z961 Presence of intraocular lens: Secondary | ICD-10-CM | POA: Diagnosis present

## 2017-01-03 DIAGNOSIS — I5032 Chronic diastolic (congestive) heart failure: Secondary | ICD-10-CM | POA: Diagnosis present

## 2017-01-03 DIAGNOSIS — Z95818 Presence of other cardiac implants and grafts: Secondary | ICD-10-CM | POA: Diagnosis not present

## 2017-01-03 DIAGNOSIS — I481 Persistent atrial fibrillation: Principal | ICD-10-CM | POA: Diagnosis present

## 2017-01-03 DIAGNOSIS — N3281 Overactive bladder: Secondary | ICD-10-CM | POA: Diagnosis not present

## 2017-01-03 DIAGNOSIS — I495 Sick sinus syndrome: Secondary | ICD-10-CM | POA: Diagnosis present

## 2017-01-03 DIAGNOSIS — R079 Chest pain, unspecified: Secondary | ICD-10-CM | POA: Diagnosis not present

## 2017-01-03 DIAGNOSIS — Z8249 Family history of ischemic heart disease and other diseases of the circulatory system: Secondary | ICD-10-CM

## 2017-01-03 DIAGNOSIS — M8580 Other specified disorders of bone density and structure, unspecified site: Secondary | ICD-10-CM | POA: Diagnosis present

## 2017-01-03 DIAGNOSIS — Z86718 Personal history of other venous thrombosis and embolism: Secondary | ICD-10-CM | POA: Diagnosis not present

## 2017-01-03 DIAGNOSIS — E876 Hypokalemia: Secondary | ICD-10-CM | POA: Diagnosis present

## 2017-01-03 DIAGNOSIS — Z90710 Acquired absence of both cervix and uterus: Secondary | ICD-10-CM | POA: Diagnosis not present

## 2017-01-03 DIAGNOSIS — Z86711 Personal history of pulmonary embolism: Secondary | ICD-10-CM | POA: Diagnosis not present

## 2017-01-03 DIAGNOSIS — I4902 Ventricular flutter: Secondary | ICD-10-CM | POA: Diagnosis not present

## 2017-01-03 DIAGNOSIS — F419 Anxiety disorder, unspecified: Secondary | ICD-10-CM | POA: Diagnosis present

## 2017-01-03 DIAGNOSIS — Z9842 Cataract extraction status, left eye: Secondary | ICD-10-CM

## 2017-01-03 DIAGNOSIS — Z9841 Cataract extraction status, right eye: Secondary | ICD-10-CM

## 2017-01-03 DIAGNOSIS — I480 Paroxysmal atrial fibrillation: Secondary | ICD-10-CM | POA: Diagnosis not present

## 2017-01-03 DIAGNOSIS — E7800 Pure hypercholesterolemia, unspecified: Secondary | ICD-10-CM | POA: Diagnosis not present

## 2017-01-03 DIAGNOSIS — I110 Hypertensive heart disease with heart failure: Secondary | ICD-10-CM | POA: Diagnosis not present

## 2017-01-03 DIAGNOSIS — Z7982 Long term (current) use of aspirin: Secondary | ICD-10-CM

## 2017-01-03 DIAGNOSIS — Z79899 Other long term (current) drug therapy: Secondary | ICD-10-CM | POA: Diagnosis not present

## 2017-01-03 DIAGNOSIS — I10 Essential (primary) hypertension: Secondary | ICD-10-CM | POA: Diagnosis not present

## 2017-01-03 LAB — URINALYSIS, ROUTINE W REFLEX MICROSCOPIC
Bacteria, UA: NONE SEEN
Bilirubin Urine: NEGATIVE
GLUCOSE, UA: NEGATIVE mg/dL
Ketones, ur: NEGATIVE mg/dL
Leukocytes, UA: NEGATIVE
Nitrite: NEGATIVE
Protein, ur: NEGATIVE mg/dL
SPECIFIC GRAVITY, URINE: 1.006 (ref 1.005–1.030)
pH: 7 (ref 5.0–8.0)

## 2017-01-03 LAB — CBC
HCT: 43.7 % (ref 36.0–46.0)
Hemoglobin: 14.5 g/dL (ref 12.0–15.0)
MCH: 29.6 pg (ref 26.0–34.0)
MCHC: 33.2 g/dL (ref 30.0–36.0)
MCV: 89.2 fL (ref 78.0–100.0)
Platelets: 221 K/uL (ref 150–400)
RBC: 4.9 MIL/uL (ref 3.87–5.11)
RDW: 13.2 % (ref 11.5–15.5)
WBC: 10 K/uL (ref 4.0–10.5)

## 2017-01-03 LAB — BASIC METABOLIC PANEL
Anion gap: 8 (ref 5–15)
BUN: 15 mg/dL (ref 6–20)
CALCIUM: 8.6 mg/dL — AB (ref 8.9–10.3)
CO2: 25 mmol/L (ref 22–32)
Chloride: 104 mmol/L (ref 101–111)
Creatinine, Ser: 0.95 mg/dL (ref 0.44–1.00)
GFR calc Af Amer: >60 mL/min (ref >60)
GFR calc non Af Amer: 56 mL/min — ABNORMAL LOW (ref >60)
GLUCOSE: 104 mg/dL — AB (ref 65–99)
Potassium: 3.9 mmol/L (ref 3.5–5.1)
Sodium: 137 mmol/L (ref 135–145)

## 2017-01-03 LAB — MAGNESIUM: MAGNESIUM: 1.7 mg/dL (ref 1.7–2.4)

## 2017-01-03 MED ORDER — ACETAMINOPHEN 325 MG PO TABS
650.0000 mg | ORAL_TABLET | ORAL | Status: DC | PRN
  Administered 2017-01-03 – 2017-01-04 (×2): 650 mg via ORAL
  Filled 2017-01-03 (×2): qty 2

## 2017-01-03 MED ORDER — OXYBUTYNIN CHLORIDE ER 10 MG PO TB24
10.0000 mg | ORAL_TABLET | Freq: Every day | ORAL | Status: DC
  Administered 2017-01-04 – 2017-01-07 (×4): 10 mg via ORAL
  Filled 2017-01-03 (×4): qty 1

## 2017-01-03 MED ORDER — METOPROLOL TARTRATE 5 MG/5ML IV SOLN: 5.0000 mg | Freq: Once | INTRAVENOUS | Status: DC

## 2017-01-03 MED ORDER — MAGNESIUM SULFATE IN D5W 1-5 GM/100ML-% IV SOLN
1.0000 g | Freq: Once | INTRAVENOUS | Status: AC
  Administered 2017-01-03: 1 g via INTRAVENOUS
  Filled 2017-01-03 (×2): qty 100

## 2017-01-03 MED ORDER — CLONAZEPAM 0.5 MG PO TABS
1.5000 mg | ORAL_TABLET | Freq: Every day | ORAL | Status: DC
  Administered 2017-01-04 – 2017-01-07 (×4): 1.5 mg via ORAL
  Filled 2017-01-03 (×4): qty 3

## 2017-01-03 MED ORDER — FLUOXETINE HCL 20 MG PO CAPS
20.0000 mg | ORAL_CAPSULE | Freq: Every day | ORAL | Status: DC
  Administered 2017-01-04 – 2017-01-07 (×4): 20 mg via ORAL
  Filled 2017-01-03 (×4): qty 1

## 2017-01-03 MED ORDER — VITAMIN D 1000 UNITS PO TABS
1000.0000 [IU] | ORAL_TABLET | Freq: Every day | ORAL | Status: DC
  Administered 2017-01-04 – 2017-01-07 (×4): 1000 [IU] via ORAL
  Filled 2017-01-03 (×4): qty 1

## 2017-01-03 MED ORDER — ALUM & MAG HYDROXIDE-SIMETH 200-200-20 MG/5ML PO SUSP
15.0000 mL | ORAL | Status: DC | PRN
  Administered 2017-01-03 – 2017-01-05 (×2): 15 mL via ORAL
  Filled 2017-01-03 (×2): qty 30

## 2017-01-03 MED ORDER — FUROSEMIDE 20 MG PO TABS
20.0000 mg | ORAL_TABLET | Freq: Every day | ORAL | Status: DC
  Administered 2017-01-05 – 2017-01-07 (×3): 20 mg via ORAL
  Filled 2017-01-03 (×4): qty 1

## 2017-01-03 MED ORDER — METOPROLOL TARTRATE 5 MG/5ML IV SOLN
2.5000 mg | Freq: Once | INTRAVENOUS | Status: AC
  Administered 2017-01-03: 2.5 mg via INTRAVENOUS
  Filled 2017-01-03: qty 5

## 2017-01-03 MED ORDER — VITAMIN B-12 1000 MCG PO TABS
1000.0000 ug | ORAL_TABLET | Freq: Every day | ORAL | Status: DC
  Administered 2017-01-04 – 2017-01-07 (×4): 1000 ug via ORAL
  Filled 2017-01-03 (×4): qty 1

## 2017-01-03 MED ORDER — DILTIAZEM HCL ER COATED BEADS 180 MG PO CP24
360.0000 mg | ORAL_CAPSULE | Freq: Every day | ORAL | Status: DC
  Administered 2017-01-03: 360 mg via ORAL
  Filled 2017-01-03: qty 2

## 2017-01-03 MED ORDER — VITAMIN C 500 MG PO TABS
500.0000 mg | ORAL_TABLET | Freq: Every day | ORAL | Status: DC
  Administered 2017-01-04 – 2017-01-07 (×4): 500 mg via ORAL
  Filled 2017-01-03 (×4): qty 1

## 2017-01-03 MED ORDER — NITROGLYCERIN 0.4 MG SL SUBL: 0.4000 mg | SUBLINGUAL_TABLET | SUBLINGUAL | Status: DC | PRN

## 2017-01-03 MED ORDER — DILTIAZEM HCL 30 MG PO TABS: 30.0000 mg | ORAL_TABLET | Freq: Four times a day (QID) | ORAL | Status: DC | PRN

## 2017-01-03 MED ORDER — DILTIAZEM HCL 60 MG PO TABS
60.0000 mg | ORAL_TABLET | Freq: Once | ORAL | Status: AC
  Administered 2017-01-03: 60 mg via ORAL
  Filled 2017-01-03: qty 1

## 2017-01-03 MED ORDER — APIXABAN 5 MG PO TABS
5.0000 mg | ORAL_TABLET | Freq: Two times a day (BID) | ORAL | Status: DC
  Administered 2017-01-03 – 2017-01-04 (×3): 5 mg via ORAL
  Filled 2017-01-03 (×4): qty 1

## 2017-01-03 NOTE — H&P (Signed)
Cardiology Admission History and Physical:   Patient ID: Judy White; MRN: [id number]; DOB: 1940/10/16   Admission date: 01/03/2017  Primary Care Provider: Josetta Huddle, MD Primary Cardiologist: Dr. Tamala Julian Primary Electrophysiologist:  Dr. Rayann Heman  Chief Complaint:  Chest discomfort, rapid heart rate  Patient Profile:   Judy White is a 76 y.o. female with a history of Persistent AFIb/flutter s/p Watchman (12/31/14), chronic CHF (diastolic), HTN, anxiety, DVT/PE Aug 2017  History of Present Illness:   [name] was seen in the office by Dr. Rayann Heman 2 days ago for a routine visit, she was feeling well and unaware of her rapid AF that she arrived with, she was started on diltiazem and planned for f/u to re-evaluate rate/rhythm tomorrow. The patient though states she started to develop a vague pressure in the center of her chest, has been keeping an eye on her HR and in the last 24 hours maintaining in the 140's and decided to come in.  No SOB, no near syncope or syncope.  LABS: K+ 3.9 BUN/Creat 15/0.95 Mag 1.7 WBC 10.0 H/H 14/43 Plts 221   Past Medical History:  Diagnosis Date  . Agoraphobia   . Anxiety   . Arthritis    "normal for the age; nothing gives me problems" (01/24/2016)  . COPD (chronic obstructive pulmonary disease) ([place])    "mild" (01/24/2016)  . Depressive disorder, not elsewhere classified   . DVT (deep venous thrombosis) ([place]) 10/08/2015   "they said the blood clot went from one of my legs to my lung"  . History of hiatal hernia    "small one" (01/24/2016)  . Hypercholesteremia   . Hypertension    pt denies this hx on 01/24/2016  . Myalgia and myositis, unspecified   . Osteopenia   . Overactive bladder   . Paroxysmal atrial fibrillation ([facility])    a. s/p Watchman implant 12/2014  . Pulmonary embolism ([place]) 10/08/2015    Past Surgical History:  Procedure Laterality Date  . APPENDECTOMY  1983  . BACK SURGERY    . BRONCHOSCOPY  01/24/2016  . CARDIAC  CATHETERIZATION     >5 yrs  . CATARACT EXTRACTION W/ INTRAOCULAR LENS  IMPLANT, BILATERAL Bilateral   . CESAREAN SECTION  1967  . POSTERIOR FUSION LUMBAR SPINE Bilateral 07/2014  . SHOULDER ARTHROSCOPY W/ ROTATOR CUFF REPAIR Left   . SHOULDER SURGERY Right    "tendon broke; couldn't be repaired"  . TONSILLECTOMY    . TOTAL ABDOMINAL HYSTERECTOMY    . WISDOM TOOTH EXTRACTION       Medications Prior to Admission: Prior to Admission medications   Medication Sig Start Date End Date Taking? Authorizing Provider  acetaminophen (TYLENOL) 500 MG tablet Take 500 mg by mouth every 6 (six) hours as needed for moderate pain or headache.    [provider]  aspirin EC 81 MG tablet Take 325 mg daily by mouth.    [provider]  Calcium-Vitamin D (CALTRATE 600 PLUS-VIT D PO) Take 1 tablet by mouth daily.     [provider]  cholecalciferol (VITAMIN D) 1000 UNITS tablet Take 1,000 Units by mouth daily.    [provider]  clonazePAM (KLONOPIN) 1 MG tablet Take 1.5 mg to 2 mg by mouth daily.    [provider]  diltiazem (CARDIZEM CD) 120 MG 24 hr capsule Take 1 capsule (120 mg total) daily by mouth. 01/01/17 04/01/17  Filbert Craze, Jeneen Rinks, MD  FLUoxetine (PROZAC) 20 MG capsule Take 20 mg by mouth  daily.  04/11/16   [provider]  furosemide (LASIX) 20 MG tablet TAKE 1 TABLET ONCE DAILY. 12/18/16   Tion Tse, Jeneen Rinks, MD  oxybutynin (DITROPAN-XL) 10 MG 24 hr tablet Take 10 mg by mouth daily.  03/22/13   [provider]  Potassium 99 MG TABS Take 99 mg by mouth daily.     [provider]  vitamin B-12 (CYANOCOBALAMIN) 1000 MCG tablet Take 1,000 mcg by mouth daily.    [provider]  vitamin C (ASCORBIC ACID) 500 MG tablet Take 500 mg by mouth daily.    [provider]     Allergies:   No Known Allergies  Social History:   Social History   Socioeconomic History  . Marital status: Married    Spouse name: Not on file  .  Number of children: Not on file  . Years of education: Not on file  . Highest education level: Not on file  Social Needs  . Financial resource strain: Not on file  . Food insecurity - worry: Not on file  . Food insecurity - inability: Not on file  . Transportation needs - medical: Not on file  . Transportation needs - non-medical: Not on file  Occupational History  . Not on file  Tobacco Use  . Smoking status: Former Smoker    Packs/day: 0.50    Years: 60.00    Pack years: 30.00    Types: Cigarettes    Last attempt to quit: 10/08/2015    Years since quitting: 1.2  . Smokeless tobacco: Never Used  Substance and Sexual Activity  . Alcohol use: Yes    Alcohol/week: 0.0 oz    Comment: 01/24/2016 "don't drink at all right now cause of my RX; social drinker before now"  . Drug use: No  . Sexual activity: No  Other Topics Concern  . Not on file  Social History Narrative  . Not on file    Family History:   The patient's family history includes Aneurysm in her father; Cancer in her unknown relative; Heart Problems in her unknown relative; Heart disease in her brother; Hypotension in her mother; Neuropathy in her brother. There is no history of Heart attack.    ROS:  Please see the history of present illness.  All other ROS reviewed and negative.     Physical Exam/Data:   Vitals:   01/03/17 1030 01/03/17 1108 01/03/17 1109 01/03/17 1130  BP: 106/85 108/88  100/62  Pulse: 85   61  Resp: (!) 25   19  Temp:   98.1 F (36.7 C)   TempSrc:   Oral   SpO2: 96%   94%   No intake or output data in the 24 hours ending 01/03/17 1202 There were no vitals filed for this visit. There is no height or weight on file to calculate BMI.  General:  Well nourished, well developed, in no acute distress HEENT: normal Lymph: no adenopathy Neck: no JVD Endocrine:  No thryomegaly Vascular: No carotid bruits  Cardiac:  IRRR; no murmurs, gallops or rubs Lungs:  CTA b/l, no wheezing, rhonchi or  rales  Abd: soft, nontender Ext: no edema Musculoskeletal:  No deformities, BUE and BLE strength normal and equal Skin: warm and dry  Neuro:  No gross focal abnormalities noted Psych:  Normal affect    EKG:  AFib/flutter, 108bpm was personally reviewed   Telemetry currently AFib/flutter 90's-110 (personally reviewed)  Relevant CV Studies:  10/09/15: TTE Study Conclusions - Left ventricle: The  cavity size was normal. Systolic function was   normal. Wall motion was normal; there were no regional wall   motion abnormalities. The study is not technically sufficient to   allow evaluation of LV diastolic function. - Aortic valve: Transvalvular velocity was within the normal range.   There was no stenosis. There was no regurgitation. - Mitral valve: Transvalvular velocity was within the normal range.   There was no evidence for stenosis. There was no regurgitation. - Right ventricle: The cavity size was normal. Wall thickness was   normal. Systolic function was normal. - Atrial septum: No defect or patent foramen ovale was identified   by color flow Doppler. - Tricuspid valve: There was trivial regurgitation. - Pulmonary arteries: Systolic pressure was within the normal   range. PA peak pressure: 28 mm Hg (S).  Stress test: 05/30/13 Impression Exercise Capacity:  Good exercise capacity. BP Response:  Normal blood pressure response. Clinical Symptoms:  Mild shotness of breath ECG Impression:  No significant ST segment change suggestive of ischemia. Comparison with Prior Nuclear Study: No images to compare  Overall Impression:  Normal stress nuclear study.  LV Wall Motion:  NL LV Function, EF 76%; NL Wall Motion   Laboratory Data:  Chemistry Recent Labs  Lab 01/03/17 1006  NA 137  K 3.9  CL 104  CO2 25  GLUCOSE 104*  BUN 15  CREATININE 0.95  CALCIUM 8.6*  GFRNONAA 56*  GFRAA >60  ANIONGAP 8    No results for input(s): PROT, ALBUMIN, AST, ALT, ALKPHOS, BILITOT in  the last 168 hours. Hematology Recent Labs  Lab 01/03/17 1006  WBC 10.0  RBC 4.90  HGB 14.5  HCT 43.7  MCV 89.2  MCH 29.6  MCHC 33.2  RDW 13.2  PLT 221   Cardiac EnzymesNo results for input(s): TROPONINI in the last 168 hours. No results for input(s): TROPIPOC in the last 168 hours.  BNPNo results for input(s): BNP, PROBNP in the last 168 hours.  DDimer No results for input(s): DDIMER in the last 168 hours.  Radiology/Studies:   Dg Chest 2 View Result Date: 01/03/2017 CLINICAL DATA:  Shortness of breath and chest pressure for the past 3 days. Ex-smoker. EXAM: CHEST  2 VIEW COMPARISON:  01/31/2016 and chest CT dated 04/05/2016. FINDINGS: Borderline enlarged cardiac silhouette with a mild decrease in size. Decreased prominence of the pulmonary vasculature and interstitial markings. Biapical pleural and parenchymal scarring. This appears more confluent at the right lung apex than previously. Thoracic spine degenerative changes. Diffuse osteopenia. IMPRESSION: 1. No acute abnormality. 2. Resolved changes of congestive heart failure. 3. Changes of COPD and mild cardiomegaly. 4. More confluent apical density on the right with appearance compatible with more consolidative scarring. Electronically Signed   By: Claudie Revering M.D.   On: 01/03/2017 11:15    Assessment and Plan:    1. AFib/flutter w/RVR     CHA2DS2Vasc is at least 4, will start Eliquis today     She has been off amiodarone over a year 2/2 concerns of possible pulmonary tox     Will start Eliquis today, TEE Friday, +/- DCCV, though plan will likely be for Tikosyn if affordable, will as CM to the case to help with this  2. HTN  3. Chronic CHF     Exam/Xray do not suggest fluid OL  4. Chest pressure     Is slight and vague, likely 2/2 rapid AF for last couple days        Severity  of Illness: The appropriate patient status for this patient is INPATIENT. Inpatient status is judged to be reasonable and necessary in order to  provide the required intensity of service to ensure the patient's safety. The patient's presenting symptoms, physical exam findings, and initial radiographic and laboratory data in the context of their chronic comorbidities is felt to place them at high risk for further clinical deterioration. Furthermore, it is not anticipated that the patient will be medically stable for discharge from the hospital within 2 midnights of admission. The following factors support the patient status of inpatient.   I certify that at the point of admission it is my clinical judgment that the patient will require inpatient hospital care spanning beyond 2 midnights from the point of admission due to high intensity of service, high risk for further deterioration and high frequency of surveillance required.    For questions or updates, please contact [facility] Please consult [email or web address] for contact info under Cardiology/STEMI.    Signed, Baldwin Jamaica, PA-C  01/03/2017 12:02 PM   I have seen, examined the patient, and reviewed the above assessment and plan.  Changes to above are made where necessary.  On exam, iRRR.  Pt with atypical atrial flutter/ afib with RVR and symptoms.  Will admit for initiation of anticoagulation followed by AAD.  Options of sotalol and tikosyn are being considered.  I have asked case management to assist with evaluation of the costs for these.  She will be in the hospital for several days given her refractory arrhythmias.  A high level of decision making was required for this encounter.  Co Sign: Thompson Grayer, MD 01/03/2017 4:36 PM

## 2017-01-03 NOTE — ED Provider Notes (Addendum)
[facility]E PROGRESSIVE CARE Provider Note   CSN: [id number] Arrival date & time: 01/03/17  0940     History   Chief Complaint Chief Complaint  Patient presents with  . Dizziness    HPI Judy White is a 76 y.o. female.  HPI   Was seen 11/5 by Dr. Rayann Heman and was started on diltiazem after being found in afib.  Went for check up, does note was having some fatigue prior to visit but did not know she was in aflutter.  Supposed to have vein surgery this morning, woke up this AM and pulse was 142 and couldn't find blood pressure on check at home.  Feeling short winded and having some pressure in chest. Did have diaphoresis which now resolved.  Pressure and dyspnea started last night, noted it on waking this morning.  CP feels like dull slight pressure in middle of chest. HR was 140s last night as well.  Denies dizziness. No recent illness.  Vomited once last week but otherwise has had no cough, fever, urinary symptoms.  Did not have dyspnea initially but developed it last night.   Not on blood thinners other than aspirin. Has watchman.    Past Medical History:  Diagnosis Date  . Agoraphobia   . Anxiety   . Arthritis    "normal for the age; nothing gives me problems" (01/24/2016)  . COPD (chronic obstructive pulmonary disease) ([place])    "mild" (01/24/2016)  . Depressive disorder, not elsewhere classified   . DVT (deep venous thrombosis) ([place]) 10/08/2015   "they said the blood clot went from one of my legs to my lung"  . History of hiatal hernia    "small one" (01/24/2016)  . Hypercholesteremia   . Hypertension    pt denies this hx on 01/24/2016  . Myalgia and myositis, unspecified   . Osteopenia   . Overactive bladder   . Paroxysmal atrial fibrillation (HCC)    a. s/p Watchman implant 12/2014  . Pulmonary embolism ([place]) 10/08/2015    Patient Active Problem List   Diagnosis Date Noted  . Atrial fibrillation with RVR ([place]) 01/03/2017  . Pneumothorax on left 01/24/2016  .  Pulmonary infiltrates on CXR   . S/P bronchoscopy with biopsy   . Abnormal CT of the chest   . Pulmonary embolism ([place])   . Pulmonary edema 10/08/2015  . Interstitial lung disease ([place]) 10/08/2015  . Acute on chronic diastolic congestive heart failure ([place]) 10/08/2015  . Paroxysmal atrial fibrillation (HCC)   . Typical atrial flutter ([place])   . Chronic diastolic heart failure ([place]) 08/25/2014  . Lumbar stenosis 08/11/2014  . On amiodarone therapy 05/21/2013  . Hypercholesteremia   . Osteopenia   . Essential hypertension   . Unspecified arthropathy, multiple sites   . Myalgia and myositis, unspecified   . COPD GOLD 0    . Overactive bladder     Past Surgical History:  Procedure Laterality Date  . APPENDECTOMY  1983  . BACK SURGERY    . BRONCHOSCOPY  01/24/2016  . CARDIAC CATHETERIZATION     >5 yrs  . CATARACT EXTRACTION W/ INTRAOCULAR LENS  IMPLANT, BILATERAL Bilateral   . CESAREAN SECTION  1967  . POSTERIOR FUSION LUMBAR SPINE Bilateral 07/2014  . SHOULDER ARTHROSCOPY W/ ROTATOR CUFF REPAIR Left   . SHOULDER SURGERY Right    "tendon broke; couldn't be repaired"  . TONSILLECTOMY    . TOTAL ABDOMINAL HYSTERECTOMY    . WISDOM TOOTH EXTRACTION  OB History    No data available       Home Medications    Prior to Admission medications   Medication Sig Start Date End Date Taking? Authorizing Provider  aspirin EC 325 MG tablet Take 325 mg daily by mouth.   Yes [provider]  Calcium-Vitamin D (CALTRATE 600 PLUS-VIT D PO) Take 1 tablet by mouth daily.    Yes [provider]  cholecalciferol (VITAMIN D) 1000 UNITS tablet Take 1,000 Units by mouth daily.   Yes [provider]  clonazePAM (KLONOPIN) 1 MG tablet Take 1.5 mg to 2 mg by mouth daily.   Yes [provider]  diltiazem (CARDIZEM CD) 120 MG 24 hr capsule Take 1 capsule (120 mg total) daily by mouth. 01/01/17 04/01/17 Yes Allred, Jeneen Rinks, MD  FLUoxetine (PROZAC) 20 MG capsule Take  20 mg by mouth daily.  04/11/16  Yes [provider]  furosemide (LASIX) 20 MG tablet TAKE 1 TABLET ONCE DAILY. Patient taking differently: TAKE 20mg  by mouth ONCE DAILY. 12/18/16  Yes Allred, Jeneen Rinks, MD  oxybutynin (DITROPAN-XL) 10 MG 24 hr tablet Take 10 mg by mouth daily.  03/22/13  Yes [provider]  Potassium 99 MG TABS Take 99 mg by mouth daily.    Yes [provider]  vitamin B-12 (CYANOCOBALAMIN) 1000 MCG tablet Take 1,000 mcg by mouth daily.   Yes [provider]  vitamin C (ASCORBIC ACID) 500 MG tablet Take 500 mg by mouth daily.   Yes [provider]  acetaminophen (TYLENOL) 500 MG tablet Take 500 mg by mouth every 6 (six) hours as needed for moderate pain or headache.    [provider]    Family History Family History  Problem Relation Age of Onset  . Aneurysm Father   . Hypotension Mother   . Neuropathy Brother   . Heart disease Brother   . Cancer Unknown        maternal side  . Heart Problems Unknown        paternal side  . Heart attack Neg Hx     Social History Social History   Tobacco Use  . Smoking status: Former Smoker    Packs/day: 0.50    Years: 60.00    Pack years: 30.00    Types: Cigarettes    Last attempt to quit: 10/08/2015    Years since quitting: 1.2  . Smokeless tobacco: Never Used  Substance Use Topics  . Alcohol use: Yes    Alcohol/week: 0.0 oz    Comment: 01/24/2016 "don't drink at all right now cause of my RX; social drinker before now"  . Drug use: No     Allergies   Patient has no known allergies.   Review of Systems Review of Systems  Constitutional: Positive for fatigue. Negative for fever.  HENT: Negative for sore throat.   Eyes: Negative for visual disturbance.  Respiratory: Positive for shortness of breath. Negative for cough.   Cardiovascular: Positive for chest pain.  Gastrointestinal: Negative for abdominal pain, nausea and vomiting.  Genitourinary: Negative for  difficulty urinating.  Musculoskeletal: Negative for back pain and neck pain.  Skin: Negative for rash.  Neurological: Negative for syncope, light-headedness and headaches.     Physical Exam Updated Vital Signs BP 103/67 (BP Location: Left Arm)   Pulse 64   Temp 97.7 F (36.5 C) (Oral)   Resp 18   Ht 5\' 5"  (1.651 m)   Wt 87.2 kg (192 lb 3.9 oz)  SpO2 97%   BMI 31.99 kg/m   Physical Exam  Constitutional: She is oriented to person, place, and time. She appears well-developed and well-nourished. No distress.  HENT:  Head: Normocephalic and atraumatic.  Eyes: Conjunctivae and EOM are normal.  Neck: Normal range of motion.  Cardiovascular: Regular rhythm, normal heart sounds and intact distal pulses. Tachycardia present. Exam reveals no gallop and no friction rub.  No murmur heard. Pulmonary/Chest: Effort normal and breath sounds normal. No respiratory distress. She has no wheezes. She has no rales.  Abdominal: Soft. She exhibits no distension. There is no tenderness. There is no guarding.  Musculoskeletal: She exhibits no edema or tenderness.  Neurological: She is alert and oriented to person, place, and time.  Skin: Skin is warm and dry. No rash noted. She is not diaphoretic. No erythema.  Nursing note and vitals reviewed.    ED Treatments / Results  Labs (all labs ordered are listed, but only abnormal results are displayed) Labs Reviewed  BASIC METABOLIC PANEL - Abnormal; Notable for the following components:      Result Value   Glucose, Bld 104 (*)    Calcium 8.6 (*)    GFR calc non Af Amer 56 (*)    All other components within normal limits  URINALYSIS, ROUTINE W REFLEX MICROSCOPIC - Abnormal; Notable for the following components:   Hgb urine dipstick SMALL (*)    Squamous Epithelial / LPF 0-5 (*)    All other components within normal limits  CBC  MAGNESIUM  BASIC METABOLIC PANEL  MAGNESIUM    EKG  EKG Interpretation  Date/Time:  Wednesday January 03 2017  09:43:10 EST Ventricular Rate:  108 PR Interval:    QRS Duration: 51 QT Interval:  232 QTC Calculation: 311 R Axis:   53 Text Interpretation:  Atrial flutter Low voltage, extremity and precordial leads Anteroseptal infarct, old Repol abnrm suggests ischemia, diffuse leads Since prior ECG, rate has increased, atrial flutter is now present Confirmed by Gareth Morgan [phone]) on 01/03/2017 10:01:14 AM       Radiology Dg Chest 2 View  Result Date: 01/03/2017 CLINICAL DATA:  Shortness of breath and chest pressure for the past 3 days. Ex-smoker. EXAM: CHEST  2 VIEW COMPARISON:  01/31/2016 and chest CT dated 04/05/2016. FINDINGS: Borderline enlarged cardiac silhouette with a mild decrease in size. Decreased prominence of the pulmonary vasculature and interstitial markings. Biapical pleural and parenchymal scarring. This appears more confluent at the right lung apex than previously. Thoracic spine degenerative changes. Diffuse osteopenia. IMPRESSION: 1. No acute abnormality. 2. Resolved changes of congestive heart failure. 3. Changes of COPD and mild cardiomegaly. 4. More confluent apical density on the right with appearance compatible with more consolidative scarring. Electronically Signed   By: Claudie Revering M.D.   On: 01/03/2017 11:15    Procedures Procedures (including critical care time)  Medications Ordered in ED Medications  apixaban (ELIQUIS) tablet 5 mg (5 mg Oral Given 01/03/17 1239)  nitroGLYCERIN (NITROSTAT) SL tablet 0.4 mg (not administered)  acetaminophen (TYLENOL) tablet 650 mg (not administered)  clonazePAM (KLONOPIN) tablet 1.5 mg (not administered)  FLUoxetine (PROZAC) capsule 20 mg (not administered)  furosemide (LASIX) tablet 20 mg (20 mg Oral Not Given 01/03/17 1729)  oxybutynin (DITROPAN-XL) 24 hr tablet 10 mg (not administered)  vitamin B-12 (CYANOCOBALAMIN) tablet 1,000 mcg (not administered)  vitamin C (ASCORBIC ACID) tablet 500 mg (not administered)  cholecalciferol  (VITAMIN D) tablet 1,000 Units (not administered)  diltiazem (CARDIZEM CD) 24 hr capsule  360 mg (360 mg Oral Given 01/03/17 1822)  diltiazem (CARDIZEM) tablet 30 mg (not administered)  magnesium sulfate IVPB 1 g 100 mL (0 g Intravenous Stopped 01/03/17 1919)  diltiazem (CARDIZEM) tablet 60 mg (60 mg Oral Given 01/03/17 1108)  metoprolol tartrate (LOPRESSOR) injection 2.5 mg (2.5 mg Intravenous Given 01/03/17 1109)   CRITICAL CARE Performed by: Tennis Must   Total critical care time: 30 minutes  Critical care time was exclusive of separately billable procedures and treating other patients.  Critical care was necessary to treat or prevent imminent or life-threatening deterioration.  Critical care was time spent personally by me on the following activities: development of treatment plan with patient and/or surrogate as well as nursing, discussions with consultants, evaluation of patient's response to treatment, examination of patient, obtaining history from patient or surrogate, ordering and performing treatments and interventions, ordering and review of laboratory studies, ordering and review of radiographic studies, pulse oximetry and re-evaluation of patient's condition.   Initial Impression / Assessment and Plan / ED Course  I have reviewed the triage vital signs and the nursing notes.  Pertinent labs & imaging results that were available during my care of the patient were reviewed by me and considered in my medical decision making (see chart for details).     76 year old female with a history of atrial fibrillation and atrial flutter status post watchman on aspirin, chronic diastolic dysfunction, hypertension, primary pulmonary embolus, no longer on anticoagulation with history of prior intracranial bleed, who presents with concern for chest pain, shortness of breath, and elevated heart rate noted at home.  Patient had been seen 2 days ago in the cardiology office, at which time she was  asymptomatic, however presented with a blood pressure of 86/66 and a heart rate of 142.  Dr. Rayann Heman had added diltiazem CD 120 mg daily, with plan for patient to return on Thursday to the cardiology office or present to the ED with new or worsening symptoms.  She presents today now with sensation of some shortness of breath and chest pressure, noting heart rates in the 140s at home.  On arrival to the emergency department, her heart rate is fluctuating between 80s and 140s, with blood pressures above 016 systolic.  Given patient did not have dyspnea initially with onset of her atrial flutter, feel her dyspnea is more likely secondary to persistent atrial flutter, and less likely due to other secondary causes including pulmonary embolus.  Chest x-ray done shows no sign of pneumonia or pulmonary edema.  Chest pressure is also likely due to her persistently elevated heart rate with atrial flutter, and will not evaluate with troponin unless rate decreases and she continues to be symptomatic.   Given metoprolol 2.5mg  IV and diltiazem 60mg  po for atrial flutter with RVR with rates fluctuating to 130s-140s.  HR initially appeared to improve to 80s however continued to have wide fluctuation up to 130s, and blood pressures decreased to 90s.  Considered amiodarone however this was previously discontinued due to concern for pulmonary fibrosis. In addition, considered cardioversion but patient has not been on anticoagulation with unknown duration of arrhythmia, and given continued normal mentation, blood pressures stable in 90s, feel risks of cardioversion at at this immediate time outweigh the benefits.  [facility] emergently for further evaluation, plan and admission.        Final Clinical Impressions(s) / ED Diagnoses   Final diagnoses:  Atrial flutter with rapid ventricular response [facility])    ED Discharge Orders  None          Gareth Morgan, MD 01/03/17 [phone]

## 2017-01-03 NOTE — Telephone Encounter (Signed)
New Message     STAT if HR is under 50 or over 120 (normal HR is 60-100 beats per minute)  1) What is your heart rate?  142   2) Do you have a log of your heart rate readings (document readings)?  No    3) Do you have any other symptoms? Cant get her bp , and has pressure in chest  (started Monday then it was fine Tuesday and started again last light)

## 2017-01-03 NOTE — Telephone Encounter (Signed)
HR 142, chest pressure and SOB since last night when she went to bed, continuing today.   Didn't rest well.  Couldn't get comfortable.  Started diltiazem CD 120 mg on Monday, took yesterday and today.  Unable to obtain BP by her home cuff, reading error.  Denies lightheadedness, dizziness.  Seen by Dr. Rayann Heman 2 days ago.   From Dr. Jackalyn Lombard OV Note 01/01/17: Follow-up in the AF clinic later this week She is at risk for decompensation/ hospitalization.  She is very tachycardic and hypotensive.  I have advised that if she has presyncope, CP, or SOB that she call 911 and report to the ED.  A high level of decision making is required for this encounter  Advised patient to call EMS to be transported to [facility] ER.  She is appreciative for the information and in agreement.  Spoke with Wannetta Sender, cardmaster to inform.

## 2017-01-03 NOTE — ED Triage Notes (Signed)
Pt arrived via EMS after experiencing slight chest pressure with mild SOB. Pt has hx of a-fib and atrial flutter. Pt was placed on Cardizem by her cardiologist on Monday. EMS 12-lead reflected A-flutter. Pt is alert and oriented x4, ambulatory and c/o of mild chest pressure at time of triage. EMS v/s. 126/102, hr 121, sp02 94 RA.

## 2017-01-03 NOTE — ED Notes (Signed)
ED Provider at bedside. 

## 2017-01-04 ENCOUNTER — Ambulatory Visit (HOSPITAL_COMMUNITY): Payer: Medicare Other | Admitting: Nurse Practitioner

## 2017-01-04 DIAGNOSIS — R079 Chest pain, unspecified: Secondary | ICD-10-CM

## 2017-01-04 LAB — BASIC METABOLIC PANEL
Anion gap: 7 (ref 5–15)
BUN: 12 mg/dL (ref 6–20)
CALCIUM: 8.5 mg/dL — AB (ref 8.9–10.3)
CO2: 28 mmol/L (ref 22–32)
CREATININE: 0.87 mg/dL (ref 0.44–1.00)
Chloride: 104 mmol/L (ref 101–111)
Glucose, Bld: 118 mg/dL — ABNORMAL HIGH (ref 65–99)
Potassium: 3.8 mmol/L (ref 3.5–5.1)
SODIUM: 139 mmol/L (ref 135–145)

## 2017-01-04 LAB — POTASSIUM: Potassium: 4 mmol/L (ref 3.5–5.1)

## 2017-01-04 LAB — MAGNESIUM: MAGNESIUM: 2 mg/dL (ref 1.7–2.4)

## 2017-01-04 MED ORDER — DOFETILIDE 500 MCG PO CAPS
500.0000 ug | ORAL_CAPSULE | Freq: Two times a day (BID) | ORAL | Status: DC
  Administered 2017-01-04 – 2017-01-06 (×4): 500 ug via ORAL
  Filled 2017-01-04 (×4): qty 1

## 2017-01-04 MED ORDER — SODIUM CHLORIDE 0.9% FLUSH
3.0000 mL | Freq: Two times a day (BID) | INTRAVENOUS | Status: DC
  Administered 2017-01-04 – 2017-01-06 (×4): 3 mL via INTRAVENOUS

## 2017-01-04 MED ORDER — APIXABAN 5 MG PO TABS
5.0000 mg | ORAL_TABLET | Freq: Two times a day (BID) | ORAL | Status: DC
  Administered 2017-01-04 – 2017-01-07 (×6): 5 mg via ORAL
  Filled 2017-01-04 (×6): qty 1

## 2017-01-04 MED ORDER — SODIUM CHLORIDE 0.9% FLUSH
3.0000 mL | Freq: Two times a day (BID) | INTRAVENOUS | Status: DC
  Administered 2017-01-06 – 2017-01-07 (×2): 3 mL via INTRAVENOUS

## 2017-01-04 MED ORDER — SODIUM CHLORIDE 0.9 % IV SOLN: Freq: Once | INTRAVENOUS | Status: DC

## 2017-01-04 MED ORDER — SODIUM CHLORIDE 0.9 % IV SOLN: 250.0000 mL | INTRAVENOUS | Status: DC | PRN

## 2017-01-04 MED ORDER — SODIUM CHLORIDE 0.9 % IV SOLN
250.0000 mL | INTRAVENOUS | Status: DC
  Administered 2017-01-04: 250 mL via INTRAVENOUS

## 2017-01-04 MED ORDER — DILTIAZEM HCL ER COATED BEADS 240 MG PO CP24
240.0000 mg | ORAL_CAPSULE | Freq: Every day | ORAL | Status: DC
  Administered 2017-01-04: 240 mg via ORAL
  Filled 2017-01-04: qty 1

## 2017-01-04 MED ORDER — SODIUM CHLORIDE 0.9% FLUSH
3.0000 mL | INTRAVENOUS | Status: DC | PRN
Start: 2017-01-04 — End: 2017-01-07

## 2017-01-04 MED ORDER — SODIUM CHLORIDE 0.9% FLUSH: 3.0000 mL | INTRAVENOUS | Status: DC | PRN

## 2017-01-04 MED ORDER — POTASSIUM CHLORIDE CRYS ER 20 MEQ PO TBCR
30.0000 meq | EXTENDED_RELEASE_TABLET | Freq: Once | ORAL | Status: AC
  Administered 2017-01-04: 09:00:00 30 meq via ORAL
  Filled 2017-01-04: qty 1

## 2017-01-04 MED ORDER — DOFETILIDE 500 MCG PO CAPS: 500.0000 ug | ORAL_CAPSULE | Freq: Two times a day (BID) | ORAL | Status: DC

## 2017-01-04 MED ORDER — HYDROCORTISONE 1 % EX CREA
1.0000 "application " | TOPICAL_CREAM | Freq: Three times a day (TID) | CUTANEOUS | Status: DC | PRN
  Filled 2017-01-04: qty 28

## 2017-01-04 NOTE — Progress Notes (Signed)
Progress Note  Patient Name: Judy White Date of Encounter: 01/04/2017  Primary Cardiologist: Dr. Tamala Julian  Subjective   Vague "not feeling well", no CP, palpitations or SOB  Inpatient Medications    Scheduled Meds: . apixaban  5 mg Oral BID  . cholecalciferol  1,000 Units Oral Daily  . clonazePAM  1.5 mg Oral Daily  . diltiazem  360 mg Oral Daily  . FLUoxetine  20 mg Oral Daily  . furosemide  20 mg Oral Daily  . oxybutynin  10 mg Oral Daily  . vitamin B-12  1,000 mcg Oral Daily  . vitamin C  500 mg Oral Daily   Continuous Infusions:  PRN Meds: acetaminophen, alum & mag hydroxide-simeth, diltiazem, nitroGLYCERIN   Vital Signs    Vitals:   01/03/17 1642 01/03/17 2148 01/04/17 0510 01/04/17 0512  BP: 103/67 (!) 100/50 (!) 87/48 (!) 90/52  Pulse: 64 67 68   Resp: 18     Temp: 97.7 F (36.5 C) (!) 97.5 F (36.4 C) 98.1 F (36.7 C)   TempSrc: Oral Oral Oral   SpO2: 97% 95% 96%   Weight: 192 lb 3.9 oz (87.2 kg)     Height: 5\' 5"  (1.651 m)       Intake/Output Summary (Last 24 hours) at 01/04/2017 0823 Last data filed at 01/03/2017 2035 Gross per 24 hour  Intake 933 ml  Output -  Net 933 ml   Filed Weights   01/03/17 1642  Weight: 192 lb 3.9 oz (87.2 kg)    Telemetry    AFlutter 70's-80's - Personally Reviewed  ECG    No new EKGs - Personally Reviewed  Physical Exam   GEN: No acute distress.   Neck: No JVD Cardiac: IRRR, no murmurs, rubs, or gallops.  Respiratory: CTA b/l. GI: Soft, nontender, non-distended  MS: No edema; No deformity. Neuro:  Nonfocal  Psych: Normal affect   Labs    Chemistry Recent Labs  Lab 01/03/17 1006 01/04/17 0306  NA 137 139  K 3.9 3.8  CL 104 104  CO2 25 28  GLUCOSE 104* 118*  BUN 15 12  CREATININE 0.95 0.87  CALCIUM 8.6* 8.5*  GFRNONAA 56* >60  GFRAA >60 >60  ANIONGAP 8 7     Hematology Recent Labs  Lab 01/03/17 1006  WBC 10.0  RBC 4.90  HGB 14.5  HCT 43.7  MCV 89.2  MCH 29.6  MCHC 33.2  RDW  13.2  PLT 221    Cardiac EnzymesNo results for input(s): TROPONINI in the last 168 hours. No results for input(s): TROPIPOC in the last 168 hours.   BNPNo results for input(s): BNP, PROBNP in the last 168 hours.   DDimer No results for input(s): DDIMER in the last 168 hours.   Radiology      Cardiac Studies   10/09/15: TTE Study Conclusions - Left ventricle: The cavity size was normal. Systolic function was normal. Wall motion was normal; there were no regional wall motion abnormalities. The study is not technically sufficient to allow evaluation of LV diastolic function. - Aortic valve: Transvalvular velocity was within the normal range. There was no stenosis. There was no regurgitation. - Mitral valve: Transvalvular velocity was within the normal range. There was no evidence for stenosis. There was no regurgitation. - Right ventricle: The cavity size was normal. Wall thickness was normal. Systolic function was normal. - Atrial septum: No defect or patent foramen ovale was identified by color flow Doppler. - Tricuspid valve: There was  trivial regurgitation. - Pulmonary arteries: Systolic pressure was within the normal range. PA peak pressure: 28 mm Hg (S).  Stress test: 05/30/13 Impression Exercise Capacity: Good exercise capacity. BP Response: Normal blood pressure response. Clinical Symptoms: Mild shotness of breath ECG Impression: No significant ST segment change suggestive of ischemia. Comparison with Prior Nuclear Study: No images to compare  Overall Impression: Normal stress nuclear study.  LV Wall Motion: NL LV Function, EF 76%; NL Wall Motion    Patient Profile     76 y.o. female with a history of Persistent AFIb/flutter s/p Watchman (12/31/14), chronic CHF (diastolic), HTN, anxiety, DVT/PE Aug 2017 admitted with rapid AFib/flutter  Assessment & Plan    1. AFib/flutter w/RVR, hx of watchman     CHA2DS2Vasc is at least 4, will start  Eliquis today     She has been off amiodarone over a year 2/2 concerns of possible pulmonary tox      s/p 2 doses of Eliquis yesterday, will continue with plans for TEE/DCCV tomorrow, NPO after MN tonight      CM consulted to evaluate patient cost for either Tikosyn or Sotalol     Will supplement K+ today in anticipation of this  2. HTN     On lower side, will reduce diltiazem dose  3. Chronic CHF     Exam/Xray do not suggest fluid OL  4. Chest pressure     Is slight and vague, likely 2/2 rapid AF for last couple days     Remains resolved with rate control    For questions or updates, please contact [facility] Please consult [email or web address] for contact info under Cardiology/STEMI.      Signed, Baldwin Jamaica, PA-C  01/04/2017, 8:23 AM    I have seen, examined the patient, and reviewed the above assessment and plan.  Changes to above are made where necessary.  On exam, iRRR.  V rates are much better.  She has started on eliquis yesterday.  Will plan TEE guided cardioversion tomorrow followed by initiation of either sotalol or tikosyn.  (we are awaiting case management to help her determine costs of tikosyn).  She will be in the hospital over the weekend for initiation of AAD therapy.  Co Sign: Thompson Grayer, MD 01/04/2017 10:43 AM

## 2017-01-04 NOTE — Progress Notes (Addendum)
0715 Bedside shift report, pt resting in bed, A&Ox4, NAD, will continue to monitor.   0800 Pt assessed, see flowsheet, updated with POC  0945 Pt medicated, see MAR, updated with new orders  1125 Pt converted to SB, [employer] paged and informed  1600 Dr. Rayann Heman rounding, new orders received for tikosyn loading, NS bolus given for hypotension. WCTM  1021 Pt resting in bed, bolus finished, updated with POC, NAD, no complaints, awaiting new RN for shift report.

## 2017-01-04 NOTE — Discharge Instructions (Addendum)
You have an appointment set up with the [facility].  Multiple studies have shown that being followed by a dedicated atrial fibrillation clinic in addition to the standard care you receive from your other physicians improves health. We believe that enrollment in the atrial fibrillation clinic will allow [place] to better care for you.   The phone number to the [facility] is [phone]. The clinic is staffed Monday through Friday from 8:30am to 5pm.  Parking Directions: The clinic is located in the Heart and Vascular Building connected to [facility]. 1)From [address] turn on to [address] and go to the 3rd entrance  (Heart and Vascular entrance) on the right. 2)Look to the right for Heart &Vascular Parking Garage. 3)A code for the entrance is required please call the clinic to receive this.   4)Take the elevators to the 1st floor. Registration is in the room with the glass walls at the end of the hallway.  If you have any trouble parking or locating the clinic, please dont hesitate to call [phone].  Information on my medicine - ELIQUIS (apixaban)  This medication education was reviewed with me or my healthcare representative as part of my discharge preparation.  The pharmacist that spoke with me during my hospital stay was:  Lavenia Atlas, [facility]  Why was Eliquis prescribed for you? Eliquis was prescribed for you to reduce the risk of a blood clot forming that can cause a stroke if you have a medical condition called atrial fibrillation (a type of irregular heartbeat).  What do You need to know about Eliquis ? Take your Eliquis TWICE DAILY - one tablet in the morning and one tablet in the evening with or without food. If you have difficulty swallowing the tablet whole please discuss with your pharmacist how to take the medication safely.  Take Eliquis exactly as prescribed by your doctor and DO NOT stop taking Eliquis without  talking to the doctor who prescribed the medication.  Stopping may increase your risk of developing a stroke.  Refill your prescription before you run out.  After discharge, you should have regular check-up appointments with your healthcare provider that is prescribing your Eliquis.  In the future your dose may need to be changed if your kidney function or weight changes by a significant amount or as you get older.  What do you do if you miss a dose? If you miss a dose, take it as soon as you remember on the same day and resume taking twice daily.  Do not take more than one dose of ELIQUIS at the same time to make up a missed dose.  Important Safety Information A possible side effect of Eliquis is bleeding. You should call your healthcare provider right away if you experience any of the following: ? Bleeding from an injury or your nose that does not stop. ? Unusual colored urine (red or dark brown) or unusual colored stools (red or black). ? Unusual bruising for unknown reasons. ? A serious fall or if you hit your head (even if there is no bleeding).  Some medicines may interact with Eliquis and might increase your risk of bleeding or clotting while on Eliquis. To help avoid this, consult your healthcare provider or pharmacist prior to using any new prescription or non-prescription medications, including herbals, vitamins, non-steroidal anti-inflammatory drugs (NSAIDs) and supplements.  This website has more information on Eliquis (apixaban): http://[email or web address].com/eliquis/home

## 2017-01-04 NOTE — Progress Notes (Signed)
Patient spontaneously converted to SR Reviewed with Dr. Rayann Heman In d/w patient she prefers to try Tikosyn, has d/w her pharmacy and is affordable, rather then the sotalol K+ 4.0 Mag 2.0 Creat 0.87 (calc clearance is 73) QTc is 423ms  Will start 538mcg BID  Tommye Standard, PA-C

## 2017-01-04 NOTE — Progress Notes (Signed)
Pharmacy Review for Dofetilide (Tikosyn) Initiation  Admit Complaint: 76 y.o. female admitted 01/03/2017 with atrial fibrillation to be initiated on dofetilide.   Assessment:  Patient Exclusion Criteria: If any screening criteria checked as "Yes", then  patient  should NOT receive dofetilide until criteria item is corrected. If "Yes" please indicate correction plan.  YES  NO Patient  Exclusion Criteria Correction Plan  []  [x]  Baseline QTc interval is greater than or equal to 440 msec. IF above YES box checked dofetilide contraindicated unless patient has ICD; then may proceed if QTc 500-550 msec or with known ventricular conduction abnormalities may proceed with QTc 550-600 msec. QTc = 0.32 430 on 11/8   []  [x]  Magnesium level is less than 1.8 mEq/l : Last magnesium:  Lab Results  Component Value Date   MG 2.0 01/04/2017         []  [x]  Potassium level is less than 4 mEq/l : Last potassium:  Lab Results  Component Value Date   K 4.0 01/04/2017         []  [x]  Patient is known or suspected to have a digoxin level greater than 2 ng/ml: No results found for: DIGOXIN    []  [x]  Creatinine clearance less than 20 ml/min (calculated using Cockcroft-Gault, actual body weight and serum creatinine): Estimated Creatinine Clearance: 58.1 mL/min (by C-G formula based on SCr of 0.87 mg/dL).    []  [x]  Patient has received drugs known to prolong the QT intervals within the last 48 hours (phenothiazines, tricyclics or tetracyclic antidepressants, erythromycin, H-1 antihistamines, cisapride, fluoroquinolones, azithromycin). Drugs not listed above may have an, as yet, undetected potential to prolong the QT interval, updated information on QT prolonging agents is available at this website:QT prolonging agents   []  [x]  Patient received a dose of hydrochlorothiazide (Oretic) alone or in any combination including triamterene (Dyazide, Maxzide) in the last 48 hours.   []  [x]  Patient received a medication  known to increase dofetilide plasma concentrations prior to initial dofetilide dose:  . Trimethoprim (Primsol, Proloprim) in the last 36 hours . Verapamil (Calan, Verelan) in the last 36 hours or a sustained release dose in the last 72 hours . Megestrol (Megace) in the last 5 days  . Cimetidine (Tagamet) in the last 6 hours . Ketoconazole (Nizoral) in the last 24 hours . Itraconazole (Sporanox) in the last 48 hours  . Prochlorperazine (Compazine) in the last 36 hours    []  [x]  Patient is known to have a history of torsades de pointes; congenital or acquired long QT syndromes.   []  [x]  Patient has received a Class 1 antiarrhythmic with less than 2 half-lives since last dose. (Disopyramide, Quinidine, Procainamide, Lidocaine, Mexiletine, Flecainide, Propafenone)   []  [x]  Patient has received amiodarone therapy in the past 3 months or amiodarone level is greater than 0.3 ng/ml.    Patient has been appropriately anticoagulated with Apixaban  Ordering provider was confirmed at [email or web address] if they are not listed on the [place] Prescribers list.  Goal of Therapy: Follow renal function, electrolytes, potential drug interactions, and dose adjustment. Provide education and 1 week supply at discharge.  Plan:  [x]   Physician selected initial dose within range recommended for patients level of renal function - will monitor for response.  []   Physician selected initial dose outside of range recommended for patients level of renal function - will discuss if the dose should be altered at this time.   Select One Calculated CrCl  Dose q12h  [x]  > 60 ml/min 500  mcg  []  40-60 ml/min 250 mcg  []  20-40 ml/min 125 mcg   2. Follow up QTc after the first 5 doses, renal function, electrolytes (K & Mg) daily x 3     days, dose adjustment, success of initiation and facilitate 1 week discharge supply as     clinically indicated.  3. Initiate Tikosyn education video (Call [phone] and ask for  Tikosyn Video # 116).  4. Place Enrollment Form on the chart for discharge supply of dofetilide.   Lawson Radar 4:38 PM 01/04/2017

## 2017-01-04 NOTE — Care Management (Addendum)
1623 01-04-17 Pt states she wants to utilize Tikosyn. Pt is aware of co pay and CM will assist with the Rx for the seven day supply from the [facility]. Pt will need the original Rx with refills. Pt has 30 ay free card Eliquis.       S/W [facility] [employer] RX # [phone]   BRAND :  1. BETAPACE 80 MG  [place] # [phone] OPT- 2    2. BETAPACE 120 MG  COVER- NOT COVER  PRIOR APPROVAL- YES [id number] OPT- 2    GENERIC :  1. SOTALOL  80 MG  COVER- YES  CO-PAY- PLUS PLAN - $ 5.00  CHOICE PLAN- $ 8.03  TIER- 2 DRUG  PRIOR APPROVAL- NO   2. SOTALOL 120 MG  COVER- YES  CO-PAY- PLUS PLAN - $ 5.00 OR CHOICE PLAN $ 17.00    TIKOSYN 125 MCG , 250 MCG AND 500 MCG BID  COVER- NOT COVER  PRIOR APPROVAL- YES # [phone] OPT- 2    90 day Dofetilide $492.54/ 419.19  PREFERRED PHARMACY :[facility]

## 2017-01-04 NOTE — Care Management (Signed)
Benefits check for Tikosyn and Eliquis  1. TIKOSYN 125 MCG , 250 MCG AND 500 MCG BID  COVER- - NOT COVER  PRIOR APPROVAL- YES- # [phone]   2. DOFITILIDE 125 MCG , 250 MCG AND 500 MCG BID  COVER- YES  CO-PAY- $ 164.30        SAME        SAME  TIER- 4 DRUG  PRIOR APPROVAL- NO   3. ELIQUIS  5 MG BID  COVER- YES  CO-PAY- $ 44.00  TIER- 3 DRUG  PRIOR APPROVAL- NO    4. APIXABAN - NONE FORMULARY   PATIENT IN COVERAGE GAP   PREFERRED PHARMACY : [facility]

## 2017-01-04 NOTE — Progress Notes (Signed)
Patient had episode of nausea and diarrhea last night before going to bed, gave maalox and ginger ale with crackers, it was resolved.  HR running in the 80s and 90s but goes into the 130s when patient gets up to use the restroom.  She had a couple episodes of Brady into the 5s and 64s.

## 2017-01-05 ENCOUNTER — Encounter (HOSPITAL_COMMUNITY)
Admission: EM | Disposition: A | Discharge status: Home / Self Care | Payer: Self-pay | Source: Home / Self Care | Attending: Internal Medicine

## 2017-01-05 DIAGNOSIS — I10 Essential (primary) hypertension: Secondary | ICD-10-CM

## 2017-01-05 LAB — BASIC METABOLIC PANEL
ANION GAP: 9 (ref 5–15)
BUN: 13 mg/dL (ref 6–20)
CALCIUM: 8.6 mg/dL — AB (ref 8.9–10.3)
CO2: 26 mmol/L (ref 22–32)
Chloride: 104 mmol/L (ref 101–111)
Creatinine, Ser: 0.94 mg/dL (ref 0.44–1.00)
GFR calc Af Amer: >60 mL/min (ref >60)
GFR, EST NON AFRICAN AMERICAN: 57 mL/min — AB (ref >60)
GLUCOSE: 105 mg/dL — AB (ref 65–99)
Potassium: 3.8 mmol/L (ref 3.5–5.1)
Sodium: 139 mmol/L (ref 135–145)

## 2017-01-05 LAB — MAGNESIUM: MAGNESIUM: 2 mg/dL (ref 1.7–2.4)

## 2017-01-05 SURGERY — CARDIOVERSION
Anesthesia: General

## 2017-01-05 MED ORDER — POTASSIUM CHLORIDE CRYS ER 20 MEQ PO TBCR
20.0000 meq | EXTENDED_RELEASE_TABLET | Freq: Once | ORAL | Status: AC
  Administered 2017-01-05: 20 meq via ORAL
  Filled 2017-01-05: qty 1

## 2017-01-05 MED ORDER — SODIUM CHLORIDE 0.9 % IV SOLN: INTRAVENOUS | Status: DC

## 2017-01-05 MED ORDER — DILTIAZEM HCL ER COATED BEADS 180 MG PO CP24
180.0000 mg | ORAL_CAPSULE | Freq: Every day | ORAL | Status: DC
  Administered 2017-01-05 – 2017-01-06 (×2): 180 mg via ORAL
  Filled 2017-01-05 (×2): qty 1

## 2017-01-05 NOTE — Progress Notes (Signed)
Progress Note  Patient Name: Judy White Date of Encounter: 01/05/2017  Primary Cardiologist: Dr. Tamala Julian  Subjective   Feels much better, ambulated yesterday in the hall without difficulty no CP, palpitations or SOB  Inpatient Medications    Scheduled Meds: . apixaban  5 mg Oral BID  . cholecalciferol  1,000 Units Oral Daily  . clonazePAM  1.5 mg Oral Daily  . diltiazem  180 mg Oral Daily  . dofetilide  500 mcg Oral BID  . FLUoxetine  20 mg Oral Daily  . furosemide  20 mg Oral Daily  . oxybutynin  10 mg Oral Daily  . sodium chloride flush  3 mL Intravenous Q12H  . sodium chloride flush  3 mL Intravenous Q12H  . vitamin B-12  1,000 mcg Oral Daily  . vitamin C  500 mg Oral Daily   Continuous Infusions: . sodium chloride    . sodium chloride 250 mL (01/04/17 1607)  . sodium chloride    . sodium chloride     PRN Meds: sodium chloride, acetaminophen, alum & mag hydroxide-simeth, diltiazem, hydrocortisone cream, nitroGLYCERIN, sodium chloride flush, sodium chloride flush   Vital Signs    Vitals:   01/04/17 1842 01/04/17 2100 01/05/17 0023 01/05/17 0525  BP: (!) 94/58 (!) 91/50 (!) 98/52 107/65  Pulse:  (!) 47 (!) 46   Resp:  18 18 16   Temp:  97.6 F (36.4 C) 98.4 F (36.9 C) (!) 97.4 F (36.3 C)  TempSrc:  Oral Oral Oral  SpO2:  95% 96% 97%  Weight:    184 lb 11.2 oz (83.8 kg)  Height:       No intake or output data in the 24 hours ending 01/05/17 0932 Filed Weights   01/03/17 1642 01/05/17 0525  Weight: 192 lb 3.9 oz (87.2 kg) 184 lb 11.2 oz (83.8 kg)    Telemetry    AFlutter 70's-80's - Personally Reviewed  ECG    SB, 45bpm, QTc 416ms, reviewed with Dr. Rayann Heman - Personally Reviewed  Physical Exam   GEN: No acute distress.   Neck: No JVD Cardiac: RRR, no murmurs, rubs, or gallops.  Respiratory: CTA b/l. GI: Soft, nontender, non-distended  MS: No edema; No deformity. Neuro:  Nonfocal  Psych: Normal affect   Labs    Chemistry Recent Labs    Lab 01/03/17 1006 01/04/17 0306 01/04/17 1409 01/05/17 0335  NA 137 139  --  139  K 3.9 3.8 4.0 3.8  CL 104 104  --  104  CO2 25 28  --  26  GLUCOSE 104* 118*  --  105*  BUN 15 12  --  13  CREATININE 0.95 0.87  --  0.94  CALCIUM 8.6* 8.5*  --  8.6*  GFRNONAA 56* >60  --  57*  GFRAA >60 >60  --  >60  ANIONGAP 8 7  --  9     Hematology Recent Labs  Lab 01/03/17 1006  WBC 10.0  RBC 4.90  HGB 14.5  HCT 43.7  MCV 89.2  MCH 29.6  MCHC 33.2  RDW 13.2  PLT 221    Cardiac EnzymesNo results for input(s): TROPONINI in the last 168 hours. No results for input(s): TROPIPOC in the last 168 hours.   BNPNo results for input(s): BNP, PROBNP in the last 168 hours.   DDimer No results for input(s): DDIMER in the last 168 hours.   Radiology      Cardiac Studies   10/09/15: TTE Study Conclusions -  Left ventricle: The cavity size was normal. Systolic function was normal. Wall motion was normal; there were no regional wall motion abnormalities. The study is not technically sufficient to allow evaluation of LV diastolic function. - Aortic valve: Transvalvular velocity was within the normal range. There was no stenosis. There was no regurgitation. - Mitral valve: Transvalvular velocity was within the normal range. There was no evidence for stenosis. There was no regurgitation. - Right ventricle: The cavity size was normal. Wall thickness was normal. Systolic function was normal. - Atrial septum: No defect or patent foramen ovale was identified by color flow Doppler. - Tricuspid valve: There was trivial regurgitation. - Pulmonary arteries: Systolic pressure was within the normal range. PA peak pressure: 28 mm Hg (S).  Stress test: 05/30/13 Impression Exercise Capacity: Good exercise capacity. BP Response: Normal blood pressure response. Clinical Symptoms: Mild shotness of breath ECG Impression: No significant ST segment change suggestive of  ischemia. Comparison with Prior Nuclear Study: No images to compare  Overall Impression: Normal stress nuclear study.  LV Wall Motion: NL LV Function, EF 76%; NL Wall Motion    Patient Profile     76 y.o. female with a history of Persistent AFIb/flutter s/p Watchman (12/31/14), chronic CHF (diastolic), HTN, anxiety, DVT/PE Aug 2017 admitted with rapid AFib/flutter  Assessment & Plan    1. AFib/flutter w/RVR, hx of watchman     CHA2DS2Vasc is at least 4, will start Eliquis today     She has been off amiodarone over a year 2/2 concerns of possible pulmonary tox     Tikosyn started last evening s/p one dose, brief PAF last night, SB this AM 50's      K+ 3.8, replaced      Mag 2.0      Creat stable 0.94      QT OK  2. HTN     On lower side, will further reduce diltiazem dose  3. Chronic CHF     Exam/Xray do not suggest fluid OL  4. Chest pressure     Is slight and vague, likely 2/2 rapid AF for last couple days     Slight vague discomfort on occassion, unclear     will monitor, if more, may decide on stress testing    For questions or updates, please contact [facility] Please consult [email or web address] for contact info under Cardiology/STEMI.      Signed, Baldwin Jamaica, PA-C  01/05/2017, 9:32 AM     I have seen, examined the patient, and reviewed the above assessment and plan.  Changes to above are made where necessary.  On exam, RRR.  Doing well with tikosyn.  QT is stable.  Will continue tikosyn over the weekend and anticipate discharge to home on Sunday.  Will need follow-up in AF clinic in 1 week.  I will see in the office in 4 weeks as already scheduled.  Co Sign: Thompson Grayer, MD 01/05/2017 10:46 AM

## 2017-01-05 NOTE — Plan of Care (Signed)
Pt has been SB/A-fib throughout night Per Tele. Other VSS. Pt denies any CP or SOB. 1st dose Tikosyn given. EKG performed 3 hours post, Labs and Qtc monitored. Will continue to monitor.

## 2017-01-05 NOTE — Progress Notes (Signed)
Potassium 3.8. Notified on call provider. Orders placed.

## 2017-01-05 NOTE — Progress Notes (Signed)
Qtc is 474 msec.  Continue current tikosyn dosing.

## 2017-01-06 DIAGNOSIS — I495 Sick sinus syndrome: Secondary | ICD-10-CM

## 2017-01-06 LAB — BASIC METABOLIC PANEL
Anion gap: 7 (ref 5–15)
BUN: 15 mg/dL (ref 6–20)
CALCIUM: 8.7 mg/dL — AB (ref 8.9–10.3)
CO2: 26 mmol/L (ref 22–32)
CREATININE: 0.9 mg/dL (ref 0.44–1.00)
Chloride: 105 mmol/L (ref 101–111)
GFR calc Af Amer: >60 mL/min (ref >60)
GFR, EST NON AFRICAN AMERICAN: 60 mL/min — AB (ref >60)
GLUCOSE: 111 mg/dL — AB (ref 65–99)
Potassium: 4 mmol/L (ref 3.5–5.1)
Sodium: 138 mmol/L (ref 135–145)

## 2017-01-06 LAB — MAGNESIUM: MAGNESIUM: 2 mg/dL (ref 1.7–2.4)

## 2017-01-06 MED ORDER — DOFETILIDE 250 MCG PO CAPS
375.0000 ug | ORAL_CAPSULE | Freq: Two times a day (BID) | ORAL | Status: DC
  Administered 2017-01-06 – 2017-01-07 (×2): 375 ug via ORAL
  Filled 2017-01-06 (×2): qty 1

## 2017-01-06 NOTE — Progress Notes (Signed)
Progress Note  Patient Name: Judy White Date of Encounter: 01/06/2017  Primary Cardiologist: Dr. Tamala Julian  Subjective   Feels better in sinus rhythm.  Nauseated however.  No medications since Monday include diltiazem, potassium/magnesium, apixaban as well as dofetilide.  Inpatient Medications    Scheduled Meds: . apixaban  5 mg Oral BID  . cholecalciferol  1,000 Units Oral Daily  . clonazePAM  1.5 mg Oral Daily  . diltiazem  180 mg Oral Daily  . dofetilide  500 mcg Oral BID  . FLUoxetine  20 mg Oral Daily  . furosemide  20 mg Oral Daily  . oxybutynin  10 mg Oral Daily  . sodium chloride flush  3 mL Intravenous Q12H  . sodium chloride flush  3 mL Intravenous Q12H  . vitamin B-12  1,000 mcg Oral Daily  . vitamin C  500 mg Oral Daily   Continuous Infusions: . sodium chloride    . sodium chloride 250 mL (01/04/17 1607)  . sodium chloride    . sodium chloride     PRN Meds: sodium chloride, acetaminophen, alum & mag hydroxide-simeth, diltiazem, hydrocortisone cream, nitroGLYCERIN, sodium chloride flush, sodium chloride flush   Vital Signs    Vitals:   01/05/17 1600 01/05/17 2106 01/06/17 0524 01/06/17 0745  BP: (!) 90/54 (!) 94/52 (!) 97/53 100/62  Pulse:  (!) 54 (!) 51 90  Resp:  16 16 18   Temp:  98.4 F (36.9 C) 97.6 F (36.4 C) 98 F (36.7 C)  TempSrc:  Oral Oral Oral  SpO2:  97% 98% 98%  Weight:   183 lb 9.6 oz (83.3 kg)   Height:        Intake/Output Summary (Last 24 hours) at 01/06/2017 0950 Last data filed at 01/06/2017 0400 Gross per 24 hour  Intake 240 ml  Output -  Net 240 ml   Filed Weights   01/03/17 1642 01/05/17 0525 01/06/17 0524  Weight: 192 lb 3.9 oz (87.2 kg) 184 lb 11.2 oz (83.8 kg) 183 lb 9.6 oz (83.3 kg)    Telemetry    Sinus rhythm with bradycardia bradycardia recurrent brief episodes of atrial fibrillation one lasting tends of minutes and the other a couple of hours associated with a modestly  rapid rate  ECG    ECG 06 January 2019 300 hours sinus at 50 with a QT interval of about 500 ms and a QTC of 520 ms with no striking repolarization abnormalities compared with ECG of drug of 21 may and compared to 24 hours  Physical Exam  Well developed and nourished in no acute distress HENT normal Neck supple with JVP-flat Clear Regular rate and rhythm, no murmurs or gallops Abd-soft with active BS No Clubbing cyanosis edema Skin-warm and dry A & Oriented  Grossly normal sensory and motor function With  Labs    Chemistry Recent Labs  Lab 01/04/17 0306 01/04/17 1409 01/05/17 0335 01/06/17 0332  NA 139  --  139 138  K 3.8 4.0 3.8 4.0  CL 104  --  104 105  CO2 28  --  26 26  GLUCOSE 118*  --  105* 111*  BUN 12  --  13 15  CREATININE 0.87  --  0.94 0.90  CALCIUM 8.5*  --  8.6* 8.7*  GFRNONAA >60  --  57* 60*  GFRAA >60  --  >60 >60  ANIONGAP 7  --  9 7     Hematology Recent Labs  Lab 01/03/17 1006  WBC 10.0  RBC 4.90  HGB 14.5  HCT 43.7  MCV 89.2  MCH 29.6  MCHC 33.2  RDW 13.2  PLT 221    Cardiac EnzymesNo results for input(s): TROPONINI in the last 168 hours. No results for input(s): TROPIPOC in the last 168 hours.   BNPNo results for input(s): BNP, PROBNP in the last 168 hours.   DDimer No results for input(s): DDIMER in the last 168 hours.   Radiology      Cardiac Studies   10/09/15: TTE Study Conclusions - Left ventricle: The cavity size was normal. Systolic function was normal. Wall motion was normal; there were no regional wall motion abnormalities. The study is not technically sufficient to allow evaluation of LV diastolic function. - Aortic valve: Transvalvular velocity was within the normal range. There was no stenosis. There was no regurgitation. - Mitral valve: Transvalvular velocity was within the normal range. There was no evidence for stenosis. There was no regurgitation. - Right ventricle: The cavity size was normal. Wall thickness was normal. Systolic  function was normal. - Atrial septum: No defect or patent foramen ovale was identified by color flow Doppler. - Tricuspid valve: There was trivial regurgitation. - Pulmonary arteries: Systolic pressure was within the normal range. PA peak pressure: 28 mm Hg (S).  Stress test: 05/30/13 Impression Exercise Capacity: Good exercise capacity. BP Response: Normal blood pressure response. Clinical Symptoms: Mild shotness of breath ECG Impression: No significant ST segment change suggestive of ischemia. Comparison with Prior Nuclear Study: No images to compare  Overall Impression: Normal stress nuclear study.  LV Wall Motion: NL LV Function, EF 76%; NL Wall Motion    Patient Profile     76 y.o. female with a history of Persistent AFIb/flutter s/p Watchman (12/31/14), chronic CHF (diastolic), HTN, anxiety, DVT/PE Aug 2017 admitted with rapid AFib/flutter  Assessment & Plan    AFib/flutter w/RVR,  Watchman in place Sinus node dysfunction  HTN Chronic CHF Chest pressure Nausea  With a sinus bradycardia, and the nausea will decrease diltiazem.  In the event of recurrent atrial flutter, I am not sure how well rate control will do any weight.  Hopefully her nausea will abate and alternative rate controlling agents could be chosen if diltiazem is implicated as a cause of her nausea.  She is currently anticoagulated and off aspirin with her watchman device.  Resumption of aspirin will be per protocol  She has had recurrent atrial fibrillation overnight.  This does not bode all that well for dofetilide although she had recurrence of atrial fibrillation not flutter and this was not nearly as rapid.  She is euvolemic.   Her QTc has lengthened and she has developed more striking repolarization abnormalities.  I will reduce her dose from 500--375    Virl Axe, MD  01/06/2017, 9:50 AM

## 2017-01-07 LAB — BASIC METABOLIC PANEL
ANION GAP: 8 (ref 5–15)
BUN: 15 mg/dL (ref 6–20)
CO2: 29 mmol/L (ref 22–32)
Calcium: 8.6 mg/dL — ABNORMAL LOW (ref 8.9–10.3)
Chloride: 103 mmol/L (ref 101–111)
Creatinine, Ser: 0.96 mg/dL (ref 0.44–1.00)
GFR, EST NON AFRICAN AMERICAN: 55 mL/min — AB (ref >60)
GLUCOSE: 101 mg/dL — AB (ref 65–99)
POTASSIUM: 3.6 mmol/L (ref 3.5–5.1)
Sodium: 140 mmol/L (ref 135–145)

## 2017-01-07 LAB — MAGNESIUM: MAGNESIUM: 2 mg/dL (ref 1.7–2.4)

## 2017-01-07 MED ORDER — DOFETILIDE 125 MCG PO CAPS: 375.0000 ug | ORAL_CAPSULE | Freq: Two times a day (BID) | ORAL | 0 refills | Status: DC

## 2017-01-07 MED ORDER — POTASSIUM CHLORIDE CRYS ER 20 MEQ PO TBCR: EXTENDED_RELEASE_TABLET | ORAL | 1 refills | Status: DC

## 2017-01-07 MED ORDER — DOFETILIDE 125 MCG PO CAPS: 375.0000 ug | ORAL_CAPSULE | Freq: Two times a day (BID) | ORAL | 2 refills | Status: DC

## 2017-01-07 MED ORDER — DILTIAZEM HCL ER COATED BEADS 120 MG PO CP24
120.0000 mg | ORAL_CAPSULE | Freq: Every day | ORAL | Status: DC
  Administered 2017-01-07: 120 mg via ORAL
  Filled 2017-01-07: qty 1

## 2017-01-07 MED ORDER — APIXABAN 5 MG PO TABS: 5.0000 mg | ORAL_TABLET | Freq: Two times a day (BID) | ORAL | 11 refills | Status: DC

## 2017-01-07 MED ORDER — POTASSIUM CHLORIDE CRYS ER 20 MEQ PO TBCR
40.0000 meq | EXTENDED_RELEASE_TABLET | ORAL | Status: AC
  Administered 2017-01-07 (×2): 40 meq via ORAL
  Filled 2017-01-07 (×2): qty 2

## 2017-01-07 MED ORDER — APIXABAN 5 MG PO TABS: 5.0000 mg | ORAL_TABLET | Freq: Two times a day (BID) | ORAL | 0 refills | Status: DC

## 2017-01-07 NOTE — Progress Notes (Signed)
Patient received discharge information and acknowledged understanding of it. Patient has 30 day eliquis card and prescription. Patient will receive tikosyn once pharmacy fills the prescription prior to discharge. Patient IV was removed.

## 2017-01-07 NOTE — Progress Notes (Signed)
Progress Note  Patient Name: Judy White Date of Encounter: 01/07/2017  Primary Cardiologist: Dr. Tamala Julian  Subjective  Without complaints.  Nausea is better.  No awareness of tachycardia.    Chart was reviewed extensively.  8/17 amiodarone was discontinued because of an abnormal CT scan.  Dr. Karsten Ro note reviewed.  Not clearly associated with amiodarone.  However, amiodarone at that point was also noted to be not very effective so the drug was discontinued.  Inpatient Medications    Scheduled Meds: . apixaban  5 mg Oral BID  . cholecalciferol  1,000 Units Oral Daily  . clonazePAM  1.5 mg Oral Daily  . dofetilide  375 mcg Oral BID  . FLUoxetine  20 mg Oral Daily  . furosemide  20 mg Oral Daily  . oxybutynin  10 mg Oral Daily  . sodium chloride flush  3 mL Intravenous Q12H  . sodium chloride flush  3 mL Intravenous Q12H  . vitamin B-12  1,000 mcg Oral Daily  . vitamin C  500 mg Oral Daily   Continuous Infusions: . sodium chloride    . sodium chloride 250 mL (01/04/17 1607)  . sodium chloride    . sodium chloride     PRN Meds: sodium chloride, acetaminophen, alum & mag hydroxide-simeth, diltiazem, hydrocortisone cream, nitroGLYCERIN, sodium chloride flush, sodium chloride flush   Vital Signs    Vitals:   01/06/17 1400 01/06/17 1619 01/06/17 2149 01/07/17 0602  BP: (!) 87/61 96/64 95/61  102/72  Pulse:  97 (!) 56 (!) 51  Resp:  18 18 18   Temp: 97.7 F (36.5 C) 98 F (36.7 C) 98.1 F (36.7 C) 97.7 F (36.5 C)  TempSrc: Oral Oral Oral Oral  SpO2:  95% 95% 96%  Weight:    183 lb 3.2 oz (83.1 kg)  Height:        Intake/Output Summary (Last 24 hours) at 01/07/2017 0809 Last data filed at 01/06/2017 1752 Gross per 24 hour  Intake 360 ml  Output -  Net 360 ml   Filed Weights   01/05/17 0525 01/06/17 0524 01/07/17 0602  Weight: 184 lb 11.2 oz (83.8 kg) 183 lb 9.6 oz (83.3 kg) 183 lb 3.2 oz (83.1 kg)    Telemetry    Sinus rhythm with bradycardia bradycardia  recurrent brief episodes of atrial fibrillation one lasting tends of minutes and the other a couple of hours associated with a modestly  rapid rate  ECG    ECG  Coarse atrial fib  Physical Exam  Well developed and nourished in no acute distress HENT normal Neck supple with JVP-flat Carotids brisk and full without bruits Clear Irregularly irregular rate and rhythm with rapid ventricular response, no murmurs or gallops Abd-soft with active BS without hepatomegaly No Clubbing cyanosis edema Skin-warm and dry A & Oriented  Grossly normal sensory and motor function   Labs    Chemistry Recent Labs  Lab 01/05/17 0335 01/06/17 0332 01/07/17 0255  NA 139 138 140  K 3.8 4.0 3.6  CL 104 105 103  CO2 26 26 29   GLUCOSE 105* 111* 101*  BUN 13 15 15   CREATININE 0.94 0.90 0.96  CALCIUM 8.6* 8.7* 8.6*  GFRNONAA 57* 60* 55*  GFRAA >60 >60 >60  ANIONGAP 9 7 8      Hematology Recent Labs  Lab 01/03/17 1006  WBC 10.0  RBC 4.90  HGB 14.5  HCT 43.7  MCV 89.2  MCH 29.6  MCHC 33.2  RDW 13.2  PLT 221  Cardiac EnzymesNo results for input(s): TROPONINI in the last 168 hours. No results for input(s): TROPIPOC in the last 168 hours.   BNPNo results for input(s): BNP, PROBNP in the last 168 hours.   DDimer No results for input(s): DDIMER in the last 168 hours.   Radiology      Cardiac Studies   10/09/15: TTE Study Conclusions - Left ventricle: The cavity size was normal. Systolic function was normal. Wall motion was normal; there were no regional wall motion abnormalities. The study is not technically sufficient to allow evaluation of LV diastolic function. - Aortic valve: Transvalvular velocity was within the normal range. There was no stenosis. There was no regurgitation. - Mitral valve: Transvalvular velocity was within the normal range. There was no evidence for stenosis. There was no regurgitation. - Right ventricle: The cavity size was normal. Wall  thickness was normal. Systolic function was normal. - Atrial septum: No defect or patent foramen ovale was identified by color flow Doppler. - Tricuspid valve: There was trivial regurgitation. - Pulmonary arteries: Systolic pressure was within the normal range. PA peak pressure: 28 mm Hg (S).  Stress test: 05/30/13 Impression Exercise Capacity: Good exercise capacity. BP Response: Normal blood pressure response. Clinical Symptoms: Mild shotness of breath ECG Impression: No significant ST segment change suggestive of ischemia. Comparison with Prior Nuclear Study: No images to compare  Overall Impression: Normal stress nuclear study.  LV Wall Motion: NL LV Function, EF 76%; NL Wall Motion    Patient Profile     76 y.o. female with a history of Persistent AFIb/flutter s/p Watchman (12/31/14), chronic CHF (diastolic), HTN, anxiety, DVT/PE Aug 2017 admitted with rapid AFib/flutter  Assessment & Plan    AFib/flutter w/RVR,  Watchman in place Sinus node dysfunction  HTN Chronic CHF Chest pressure Nausea Hypokalemia  Will resume dilt for rate control  Many discussions w JA re plan-- he would like her discharge on dofetilide, with hopes that it will be more effective than it appears   I suspect AV ablation and pacing will be the ultimate strategy as bradycardia will limit adjunctive rate control  Her nausea abated with stopping the dilt, but not sure if it was the cause  He woould like early EP followup   Will review QTc in sinus to make determination re home dosing  Will need K repletion      Virl Axe, MD  01/07/2017, 8:09 AM

## 2017-01-07 NOTE — Care Management Note (Signed)
Case Management Note [name] RN, BSN Unit 4E-Case Manager-- [place] weekend coverage [phone]  Patient Details  Name: Judy White MRN: [id number] Date of Birth: 1940/07/21  Subjective/Objective:      Pt admitted with afib              Action/Plan: PTA pt lived at home with family- independent- benefits check completed for Tikosyn, Eliquis- pt has been given her 30 day free card to use with Eliquis- bedside RN has 7 day script for Tikosyn that has been taken to [place] to fill for discharge- CM has spoken with pt regarding Eliquis and Tikosyn-   Expected Discharge Date:  01/07/17               Expected Discharge Plan:  Home/Self Care  In-House Referral:  NA  Discharge planning Services  CM Consult, Medication Assistance  Post Acute Care Choice:  NA Choice offered to:  NA  DME Arranged:    DME Agency:     HH Arranged:    [place] Agency:     Status of Service:  Completed, signed off  If discussed at [place] of Stay Meetings, dates discussed:    Discharge Disposition: home/self care   Additional Comments:  Dawayne Patricia, RN 01/07/2017, 12:36 PM

## 2017-01-07 NOTE — Discharge Summary (Signed)
Discharge Summary    Patient ID: Judy White,  MRN: [id number], DOB/AGE: 1940-11-22 76 y.o.  Admit date: 01/03/2017 Discharge date: 01/07/2017  Primary Care Provider: Josetta Huddle Primary Cardiologist: Dr. Tamala Julian Primary Electrophysiologist:  Dr. Rayann Heman    Discharge Diagnoses    Active Problems:   Atrial fibrillation with RVR (HCC)   Nausea  Tikosyn initiation   Chest pain   Chronic diastolic CHF  Allergies No Known Allergies  Diagnostic Studies/Procedures    None   History of Present Illness     Judy White is a 76 y.o. female with a history of Persistent AFIb/flutter s/p Watchman (12/31/14), chronic CHF (diastolic), HTN, anxiety, DVT/PE Aug 2017 presented with afib RVR 01/03/17.   Judy White was seen in the office by Dr. Rayann Heman 2 days ago for a routine visit, she was feeling well and unaware of her rapid AF that she arrived with, she was started on diltiazem and planned for f/u to re-evaluate rate/rhythm tomorrow. The patient though states she started to develop a vague pressure in the center of her chest, has been keeping an eye on her HR and in the last 24 hours maintaining in the 140's and decided to come in.  No SOB, no near syncope or syncope.  Hospital Course     Consultants: None  1. AFib/flutter w/RVR - Admitted for initiation of anticoagulation followed by AAD. She has been off amiodarone over a year 2/2 concerns of possible pulmonary tox. Started on Eliquis and plan was for TEE Friday, +/- DCCV. However, converted to sinus rhythm. Patient started on Tikosyn. Repleted K and Mg as needed. Monitored QT. Dr. Caryl Comes suspected that AV ablation and pacing will be the ultimate strategy as bradycardia will limit adjunctive rate control.   2. HTN - Remained soft low   3. Chronic CHF - volume status stable   4. Chest pressure - vague, likely 2/2 rapid AF for last couple days   The patient has been seen by Dr. Caryl Comes today and deemed ready for discharge home. All  follow-up appointments have been scheduled. Discharge medications are listed below.    Discharge Vitals Blood pressure (!) 101/58, pulse (!) 51, temperature 97.7 F (36.5 C), temperature source Oral, resp. rate 18, height 5\' 5"  (1.651 m), weight 183 lb 3.2 oz (83.1 kg), SpO2 96 %.  Filed Weights   01/05/17 0525 01/06/17 0524 01/07/17 0602  Weight: 184 lb 11.2 oz (83.8 kg) 183 lb 9.6 oz (83.3 kg) 183 lb 3.2 oz (83.1 kg)    Labs & Radiologic Studies     CBC No results for input(s): WBC, NEUTROABS, HGB, HCT, MCV, PLT in the last 72 hours. Basic Metabolic Panel Recent Labs    01/06/17 0332 01/07/17 0255  NA 138 140  K 4.0 3.6  CL 105 103  CO2 26 29  GLUCOSE 111* 101*  BUN 15 15  CREATININE 0.90 0.96  CALCIUM 8.7* 8.6*  MG 2.0 2.0    Dg Chest 2 View  Result Date: 01/03/2017 CLINICAL DATA:  Shortness of breath and chest pressure for the past 3 days. Ex-smoker. EXAM: CHEST  2 VIEW COMPARISON:  01/31/2016 and chest CT dated 04/05/2016. FINDINGS: Borderline enlarged cardiac silhouette with a mild decrease in size. Decreased prominence of the pulmonary vasculature and interstitial markings. Biapical pleural and parenchymal scarring. This appears more confluent at the right lung apex than previously. Thoracic spine degenerative changes. Diffuse osteopenia. IMPRESSION: 1. No acute abnormality. 2. Resolved changes of congestive  heart failure. 3. Changes of COPD and mild cardiomegaly. 4. More confluent apical density on the right with appearance compatible with more consolidative scarring. Electronically Signed   By: Claudie Revering M.D.   On: 01/03/2017 11:15    Disposition   Pt is being discharged home today in good condition.  Follow-up Plans & Appointments    Follow-up Information    [facility] Follow up on 01/15/2017.   Specialty:  Cardiology Why:  11:30AM Contact information: [address] [id number] [place]  [place] [phone]       Thompson Grayer, MD Follow up on 02/05/2017.   Specialty:  Cardiology Why:  11:15AM Contact information: [place] [address] [phone]          Discharge Instructions    Diet - low sodium heart healthy   Complete by:  As directed    Increase activity slowly   Complete by:  As directed       Discharge Medications   Current Discharge Medication List    START taking these medications   Details  !! apixaban (ELIQUIS) 5 MG TABS tablet Take 1 tablet (5 mg total) 2 (two) times daily by mouth. Qty: 60 tablet, Refills: 11    !! apixaban (ELIQUIS) 5 MG TABS tablet Take 1 tablet (5 mg total) 2 (two) times daily by mouth. For free 30 days supply Qty: 60 tablet, Refills: 0    !! dofetilide (TIKOSYN) 125 MCG capsule Take 3 capsules (375 mcg total) 2 (two) times daily by mouth. Qty: 180 capsule, Refills: 2    !! dofetilide (TIKOSYN) 125 MCG capsule Take 3 capsules (375 mcg total) 2 (two) times daily by mouth. For 7 days supply Qty: 42 capsule, Refills: 0    potassium chloride SA (K-DUR,KLOR-CON) 20 MEQ tablet Take 83meq twice daily for 3 days and then daily Qty: 36 tablet, Refills: 1     !! - Potential duplicate medications found. Please discuss with provider.    CONTINUE these medications which have NOT CHANGED   Details  Calcium-Vitamin D (CALTRATE 600 PLUS-VIT D PO) Take 1 tablet by mouth daily.     cholecalciferol (VITAMIN D) 1000 UNITS tablet Take 1,000 Units by mouth daily.    clonazePAM (KLONOPIN) 1 MG tablet Take 1.5 mg to 2 mg by mouth daily.    diltiazem (CARDIZEM CD) 120 MG 24 hr capsule Take 1 capsule (120 mg total) daily by mouth. Qty: 90 capsule, Refills: 3    FLUoxetine (PROZAC) 20 MG capsule Take 20 mg by mouth daily.     furosemide (LASIX) 20 MG tablet TAKE 1 TABLET ONCE DAILY. Qty: 90 tablet, Refills: 1    oxybutynin (DITROPAN-XL) 10 MG 24 hr tablet Take 10 mg by mouth daily.     vitamin B-12  (CYANOCOBALAMIN) 1000 MCG tablet Take 1,000 mcg by mouth daily.    vitamin C (ASCORBIC ACID) 500 MG tablet Take 500 mg by mouth daily.    acetaminophen (TYLENOL) 500 MG tablet Take 500 mg by mouth every 6 (six) hours as needed for moderate pain or headache.      STOP taking these medications     aspirin EC 325 MG tablet      Potassium 99 MG TABS            Outstanding Labs/Studies   BEMT during follow up  Duration of Discharge Encounter   Greater than 30 minutes including physician time.  Signed, Sherrill Mckamie PA-C  01/07/2017, 11:51 AM

## 2017-01-08 ENCOUNTER — Encounter: Payer: Self-pay | Admitting: Internal Medicine

## 2017-01-08 ENCOUNTER — Encounter: Payer: Self-pay | Admitting: Interventional Cardiology

## 2017-01-08 DIAGNOSIS — M1711 Unilateral primary osteoarthritis, right knee: Secondary | ICD-10-CM | POA: Diagnosis not present

## 2017-01-08 NOTE — Telephone Encounter (Signed)
Spoke with patient regarding her concerns and she verbalized the understanding that she will keep her appointment with Roderic Palau in the Afib clinic on 11/19

## 2017-01-09 ENCOUNTER — Encounter: Payer: Self-pay | Admitting: Internal Medicine

## 2017-01-11 ENCOUNTER — Encounter (HOSPITAL_COMMUNITY): Payer: Self-pay | Admitting: Nurse Practitioner

## 2017-01-11 ENCOUNTER — Telehealth (HOSPITAL_COMMUNITY): Payer: Self-pay | Admitting: *Deleted

## 2017-01-11 ENCOUNTER — Encounter: Payer: Self-pay | Admitting: Internal Medicine

## 2017-01-11 NOTE — Telephone Encounter (Signed)
Tier exception for Dofetilide approved through 01/11/18 to tier 2. Case #[id number]

## 2017-01-15 ENCOUNTER — Ambulatory Visit (HOSPITAL_COMMUNITY)
Admission: RE | Admit: 2017-01-15 | Discharge: 2017-01-15 | Disposition: A | Discharge status: Home / Self Care | Payer: Medicare Other | Source: Ambulatory Visit | Attending: Nurse Practitioner | Admitting: Nurse Practitioner

## 2017-01-15 ENCOUNTER — Encounter (HOSPITAL_COMMUNITY): Payer: Self-pay | Admitting: Nurse Practitioner

## 2017-01-15 ENCOUNTER — Encounter: Payer: Self-pay | Admitting: Interventional Cardiology

## 2017-01-15 VITALS — BP 132/74 | HR 57 | Ht 60.0 in | Wt 186.6 lb

## 2017-01-15 DIAGNOSIS — Z87891 Personal history of nicotine dependence: Secondary | ICD-10-CM | POA: Insufficient documentation

## 2017-01-15 DIAGNOSIS — Z86711 Personal history of pulmonary embolism: Secondary | ICD-10-CM | POA: Diagnosis not present

## 2017-01-15 DIAGNOSIS — Z79899 Other long term (current) drug therapy: Secondary | ICD-10-CM | POA: Insufficient documentation

## 2017-01-15 DIAGNOSIS — J449 Chronic obstructive pulmonary disease, unspecified: Secondary | ICD-10-CM | POA: Insufficient documentation

## 2017-01-15 DIAGNOSIS — F329 Major depressive disorder, single episode, unspecified: Secondary | ICD-10-CM | POA: Diagnosis not present

## 2017-01-15 DIAGNOSIS — E7800 Pure hypercholesterolemia, unspecified: Secondary | ICD-10-CM | POA: Diagnosis not present

## 2017-01-15 DIAGNOSIS — I480 Paroxysmal atrial fibrillation: Secondary | ICD-10-CM | POA: Insufficient documentation

## 2017-01-15 DIAGNOSIS — F419 Anxiety disorder, unspecified: Secondary | ICD-10-CM | POA: Insufficient documentation

## 2017-01-15 DIAGNOSIS — I4891 Unspecified atrial fibrillation: Secondary | ICD-10-CM | POA: Diagnosis present

## 2017-01-15 DIAGNOSIS — I5032 Chronic diastolic (congestive) heart failure: Secondary | ICD-10-CM | POA: Diagnosis not present

## 2017-01-15 DIAGNOSIS — I110 Hypertensive heart disease with heart failure: Secondary | ICD-10-CM | POA: Diagnosis not present

## 2017-01-15 DIAGNOSIS — Z7901 Long term (current) use of anticoagulants: Secondary | ICD-10-CM | POA: Diagnosis not present

## 2017-01-15 DIAGNOSIS — Z86718 Personal history of other venous thrombosis and embolism: Secondary | ICD-10-CM | POA: Insufficient documentation

## 2017-01-15 LAB — BASIC METABOLIC PANEL
Anion gap: 6 (ref 5–15)
BUN: 11 mg/dL (ref 6–20)
CALCIUM: 9.1 mg/dL (ref 8.9–10.3)
CHLORIDE: 102 mmol/L (ref 101–111)
CO2: 30 mmol/L (ref 22–32)
Creatinine, Ser: 0.79 mg/dL (ref 0.44–1.00)
GFR calc Af Amer: >60 mL/min (ref >60)
GFR calc non Af Amer: >60 mL/min (ref >60)
Glucose, Bld: 77 mg/dL (ref 65–99)
POTASSIUM: 4.3 mmol/L (ref 3.5–5.1)
SODIUM: 138 mmol/L (ref 135–145)

## 2017-01-15 LAB — MAGNESIUM: Magnesium: 1.9 mg/dL (ref 1.7–2.4)

## 2017-01-15 NOTE — Progress Notes (Signed)
Primary Care Physician: Josetta Huddle, MD Referring Physician: Dr. Odette Horns Judy White is a 76 y.o. female with a h/o Persistent AFIb/flutter s/p Watchman (12/31/14), chronic CHF (diastolic), HTN, anxiety, DVT/PE Aug 2017 presented with afib RVR 01/03/17.  [name] was seen in the office by Dr. Rayann Heman 2 days prior to hospitalization for a routine visit and was in rapid afib but unaware. She was started on diltiazem and plans for f/u the next day but went to the  ER that night  for chest pressure. She was admitted and started on Tikosyn. She was in SR early the Sunday she was d/c,but pt states that she had gone back into afib shortly before d/c. She was d/c on dofetilide 375 mcg bid for prolonged qtc on 500 mcg bid. Since d/c, she has had several breakthrough episodes of afib but v rates are reasonable just over 100 bpm. Pt on 2 occasions, reduced her dofetilide to 250 mcg bid but then increased back to d/c dose of 375 mcg bid. She did so because she thought the dofetilide wasn't working.  Today, EKG shows sinus brady at 57 bpm.   Today, she denies symptoms of   chest pain, shortness of breath, orthopnea, PND, lower extremity edema, dizziness, presyncope, syncope, or neurologic sequela. The patient is tolerating medications without difficulties and is otherwise without complaint today.   Past Medical History:  Diagnosis Date  . Agoraphobia   . Anxiety   . Arthritis    "normal for the age; nothing gives me problems" (01/24/2016)  . COPD (chronic obstructive pulmonary disease) ([facility])    "mild" (01/24/2016)  . Depressive disorder, not elsewhere classified   . DVT (deep venous thrombosis) ([facility]) 10/08/2015   "they said the blood clot went from one of my legs to my lung"  . History of hiatal hernia    "small one" (01/24/2016)  . Hypercholesteremia   . Hypertension    pt denies this hx on 01/24/2016  . Myalgia and myositis, unspecified   . Osteopenia   . Overactive bladder   . Paroxysmal atrial  fibrillation (HCC)    a. s/p Watchman implant 12/2014  . Pulmonary embolism ([facility]) 10/08/2015   Past Surgical History:  Procedure Laterality Date  . APPENDECTOMY  1983  . BACK SURGERY    . BRONCHOSCOPY  01/24/2016  . CARDIAC CATHETERIZATION     >5 yrs  . CATARACT EXTRACTION W/ INTRAOCULAR LENS  IMPLANT, BILATERAL Bilateral   . CESAREAN SECTION  1967  . LEFT ATRIAL APPENDAGE OCCLUSION N/A 12/31/2014   Performed by Thompson Grayer, MD at [facility]  . Lumbar four-five Posterior lumbar interbody fusion N/A 08/11/2014   Performed by Kristeen Miss, MD at [facility] NEURO ORS  . POSTERIOR FUSION LUMBAR SPINE Bilateral 07/2014  . SHOULDER ARTHROSCOPY W/ ROTATOR CUFF REPAIR Left   . SHOULDER SURGERY Right    "tendon broke; couldn't be repaired"  . TONSILLECTOMY    . TOTAL ABDOMINAL HYSTERECTOMY    . TRANSESOPHAGEAL ECHOCARDIOGRAM (TEE) N/A 02/16/2015   Performed by Pixie Casino, MD at [facility]  . TRANSESOPHAGEAL ECHOCARDIOGRAM (TEE) N/A 12/22/2014   Performed by Pixie Casino, MD at [facility]  . VIDEO BRONCHOSCOPY WITH FLUORO Bilateral 01/24/2016   Performed by Collene Gobble, MD at [facility]  . WISDOM TOOTH EXTRACTION      Current Outpatient Medications  Medication Sig Dispense Refill  . acetaminophen (TYLENOL) 500 MG tablet Take 500 mg by mouth every  6 (six) hours as needed for moderate pain or headache.    Marland Kitchen apixaban (ELIQUIS) 5 MG TABS tablet Take 1 tablet (5 mg total) 2 (two) times daily by mouth. 60 tablet 11  . Calcium-Vitamin D (CALTRATE 600 PLUS-VIT D PO) Take 1 tablet by mouth daily.     . cholecalciferol (VITAMIN D) 1000 UNITS tablet Take 1,000 Units by mouth daily.    . clonazePAM (KLONOPIN) 1 MG tablet Take 1.5 mg to 2 mg by mouth daily.    Marland Kitchen diltiazem (CARDIZEM CD) 120 MG 24 hr capsule Take 1 capsule (120 mg total) daily by mouth. 90 capsule 3  . dofetilide (TIKOSYN) 125 MCG capsule Take 3 capsules (375 mcg total) 2 (two) times daily by mouth. 180 capsule 2    . FLUoxetine (PROZAC) 20 MG capsule Take 20 mg by mouth daily.     . furosemide (LASIX) 20 MG tablet TAKE 1 TABLET ONCE DAILY. (Patient taking differently: TAKE 20mg  by mouth ONCE DAILY.) 90 tablet 1  . oxybutynin (DITROPAN-XL) 10 MG 24 hr tablet Take 10 mg by mouth daily.     . potassium chloride SA (K-DUR,KLOR-CON) 20 MEQ tablet Take 48meq twice daily for 3 days and then daily (Patient taking differently: No sig reported) 36 tablet 1  . vitamin B-12 (CYANOCOBALAMIN) 1000 MCG tablet Take 1,000 mcg by mouth daily.    . vitamin C (ASCORBIC ACID) 500 MG tablet Take 500 mg by mouth daily.    . vitamin E (VITAMIN E) 400 UNIT capsule Take 400 Units daily by mouth.     No current facility-administered medications for this encounter.     No Known Allergies  Social History   Socioeconomic History  . Marital status: Married    Spouse name: Not on file  . Number of children: Not on file  . Years of education: Not on file  . Highest education level: Not on file  Social Needs  . Financial resource strain: Not on file  . Food insecurity - worry: Not on file  . Food insecurity - inability: Not on file  . Transportation needs - medical: Not on file  . Transportation needs - non-medical: Not on file  Occupational History  . Not on file  Tobacco Use  . Smoking status: Former Smoker    Packs/day: 0.50    Years: 60.00    Pack years: 30.00    Types: Cigarettes    Last attempt to quit: 10/08/2015    Years since quitting: 1.2  . Smokeless tobacco: Never Used  Substance and Sexual Activity  . Alcohol use: Yes    Alcohol/week: 0.0 oz    Comment: 01/24/2016 "don't drink at all right now cause of my RX; social drinker before now"  . Drug use: No  . Sexual activity: No  Other Topics Concern  . Not on file  Social History Narrative  . Not on file    Family History  Problem Relation Age of Onset  . Aneurysm Father   . Hypotension Mother   . Neuropathy Brother   . Heart disease Brother   .  Cancer Unknown        maternal side  . Heart Problems Unknown        paternal side  . Heart attack Neg Hx     ROS- All systems are reviewed and negative except as per the HPI above  Physical Exam: Vitals:   01/15/17 1149  BP: 132/74  Pulse: (!) 57  Weight: 186 lb  9.6 oz (84.6 kg)  Height: 5' (1.524 m)   Wt Readings from Last 3 Encounters:  01/15/17 186 lb 9.6 oz (84.6 kg)  01/07/17 183 lb 3.2 oz (83.1 kg)  01/01/17 188 lb (85.3 kg)    Labs: Lab Results  Component Value Date   NA 138 01/15/2017   K 4.3 01/15/2017   CL 102 01/15/2017   CO2 30 01/15/2017   GLUCOSE 77 01/15/2017   BUN 11 01/15/2017   CREATININE 0.79 01/15/2017   CALCIUM 9.1 01/15/2017   MG 1.9 01/15/2017   Lab Results  Component Value Date   INR 1.21 12/02/2015   No results found for: CHOL, HDL, LDLCALC, TRIG   GEN- The patient is well appearing, alert and oriented x 3 today.   Head- normocephalic, atraumatic Eyes-  Sclera clear, conjunctiva pink Ears- hearing intact Oropharynx- clear Neck- supple, no JVP Lymph- no cervical lymphadenopathy Lungs- Clear to ausculation bilaterally, normal work of breathing Heart- Regular rate and rhythm, no murmurs, rubs or gallops, PMI not laterally displaced GI- soft, NT, ND, + BS Extremities- no clubbing, cyanosis, or edema MS- no significant deformity or atrophy Skin- no rash or lesion Psych- euthymic mood, full affect Neuro- strength and sensation are intact  EKG- Sinus brady at 57 bpm, Pr int 132 ms qrs int 72 bpm,, qtc 453 ms(stable) Epic records reviewed    Assessment and Plan: 1.Paroxysmal afib  General  education re afib and  Tikosyn precations Told NOT to adjust dose of Tikosyn and to leave at current dose of 375 mcg bid  Continue diltiazem 120 mg qd Continue eliquis 5 mg bid for chadsvasc score of at least 6 Continue to keep records of breakthrough afib  F/u with Dr. Rayann Heman 12/10  Geroge Baseman. Nonna Renninger, [facility] [address] [place],  [address] [phone]

## 2017-01-24 ENCOUNTER — Encounter: Payer: Self-pay | Admitting: Internal Medicine

## 2017-01-26 ENCOUNTER — Encounter: Payer: Self-pay | Admitting: Internal Medicine

## 2017-01-26 ENCOUNTER — Telehealth: Payer: Self-pay

## 2017-01-26 NOTE — Telephone Encounter (Signed)
Spoke with patient regarding her Eliquis concerns.  I told her to continue the medication until she sees Dr. Rayann Heman 12/10 and he can make a further recommendation.

## 2017-01-29 ENCOUNTER — Encounter: Payer: Self-pay | Admitting: Internal Medicine

## 2017-02-02 ENCOUNTER — Encounter (HOSPITAL_COMMUNITY): Payer: Self-pay

## 2017-02-05 ENCOUNTER — Ambulatory Visit: Payer: Medicare Other | Admitting: Internal Medicine

## 2017-02-07 DIAGNOSIS — M1711 Unilateral primary osteoarthritis, right knee: Secondary | ICD-10-CM | POA: Diagnosis not present

## 2017-02-13 DIAGNOSIS — F3341 Major depressive disorder, recurrent, in partial remission: Secondary | ICD-10-CM | POA: Diagnosis not present

## 2017-02-16 DIAGNOSIS — I83892 Varicose veins of left lower extremities with other complications: Secondary | ICD-10-CM | POA: Diagnosis not present

## 2017-02-16 DIAGNOSIS — I8312 Varicose veins of left lower extremity with inflammation: Secondary | ICD-10-CM | POA: Diagnosis not present

## 2017-02-22 DIAGNOSIS — I8312 Varicose veins of left lower extremity with inflammation: Secondary | ICD-10-CM | POA: Diagnosis not present

## 2017-03-09 ENCOUNTER — Encounter: Payer: Self-pay | Admitting: Internal Medicine

## 2017-03-09 ENCOUNTER — Ambulatory Visit (INDEPENDENT_AMBULATORY_CARE_PROVIDER_SITE_OTHER): Payer: Medicare Other | Admitting: Internal Medicine

## 2017-03-09 ENCOUNTER — Telehealth: Payer: Self-pay | Admitting: Interventional Cardiology

## 2017-03-09 VITALS — BP 124/68 | HR 102 | Ht 65.25 in | Wt 190.8 lb

## 2017-03-09 DIAGNOSIS — I5032 Chronic diastolic (congestive) heart failure: Secondary | ICD-10-CM

## 2017-03-09 DIAGNOSIS — I480 Paroxysmal atrial fibrillation: Secondary | ICD-10-CM

## 2017-03-09 DIAGNOSIS — I10 Essential (primary) hypertension: Secondary | ICD-10-CM

## 2017-03-09 DIAGNOSIS — I4819 Other persistent atrial fibrillation: Secondary | ICD-10-CM

## 2017-03-09 DIAGNOSIS — I481 Persistent atrial fibrillation: Secondary | ICD-10-CM

## 2017-03-09 MED ORDER — DILTIAZEM HCL ER COATED BEADS 240 MG PO CP24: 240.0000 mg | ORAL_CAPSULE | Freq: Every day | ORAL | 3 refills | Status: DC

## 2017-03-09 NOTE — Patient Instructions (Addendum)
Medication Instructions:  Your physician has recommended you make the following change in your medication:  1) Stop Tikosyn 2) Increase Diltiazem to 240 mg daily   Labwork: Your physician recommends that you return for lab work today: BMP/Mag   Testing/Procedures: None ordered   Follow-Up: Your physician recommends that you schedule a follow-up appointment in: 3 months with Dr Tamala Julian   Any Other Special Instructions Will Be Listed Below (If Applicable).     If you need a refill on your cardiac medications before your next appointment, please call your pharmacy.

## 2017-03-09 NOTE — Telephone Encounter (Signed)
Error

## 2017-03-09 NOTE — Progress Notes (Signed)
PCP: Josetta Huddle, MD Primary Cardiologist: Dr Maia Breslow Larocca is a 77 y.o. female who presents today for routine electrophysiology followup.  She seems to be doing reasonably well but is back in afib.  Her husband feels that she is probably in afib most days.  History today was difficult as the patient was quite hostile again with me and not very interested in participating in an interactive medical visit.  See below.   Today she denies CP, SOB, presyncope or syncope.    Past Medical History:  Diagnosis Date  . Agoraphobia   . Anxiety   . Arthritis    "normal for the age; nothing gives me problems" (01/24/2016)  . COPD (chronic obstructive pulmonary disease) ([place])    "mild" (01/24/2016)  . Depressive disorder, not elsewhere classified   . DVT (deep venous thrombosis) ([place]) 10/08/2015   "they said the blood clot went from one of my legs to my lung"  . History of hiatal hernia    "small one" (01/24/2016)  . Hypercholesteremia   . Hypertension    pt denies this hx on 01/24/2016  . Myalgia and myositis, unspecified   . Osteopenia   . Overactive bladder   . Paroxysmal atrial fibrillation (HCC)    a. s/p Watchman implant 12/2014  . Pulmonary embolism ([place]) 10/08/2015   Past Surgical History:  Procedure Laterality Date  . APPENDECTOMY  1983  . BACK SURGERY    . BRONCHOSCOPY  01/24/2016  . CARDIAC CATHETERIZATION     >5 yrs  . CATARACT EXTRACTION W/ INTRAOCULAR LENS  IMPLANT, BILATERAL Bilateral   . CESAREAN SECTION  1967  . LEFT ATRIAL APPENDAGE OCCLUSION N/A 12/31/2014   Procedure: LEFT ATRIAL APPENDAGE OCCLUSION;  Surgeon: Thompson Grayer, MD;  Location: [place] [facility];  Service: Cardiovascular;  Laterality: N/A;  . POSTERIOR FUSION LUMBAR SPINE Bilateral 07/2014  . SHOULDER ARTHROSCOPY W/ ROTATOR CUFF REPAIR Left   . SHOULDER SURGERY Right    "tendon broke; couldn't be repaired"  . TEE WITHOUT CARDIOVERSION N/A 12/22/2014   Procedure: TRANSESOPHAGEAL  ECHOCARDIOGRAM (TEE);  Surgeon: Pixie Casino, MD;  Location: [facility] ENDOSCOPY;  Service: Cardiovascular;  Laterality: N/A;  . TEE WITHOUT CARDIOVERSION N/A 02/16/2015   post Watchman TEE with good seal and no leak around device   . TONSILLECTOMY    . TOTAL ABDOMINAL HYSTERECTOMY    . VIDEO BRONCHOSCOPY Bilateral 01/24/2016   Procedure: VIDEO BRONCHOSCOPY WITH FLUORO;  Surgeon: Collene Gobble, MD;  Location: [place];  Service: Cardiopulmonary;  Laterality: Bilateral;  . WISDOM TOOTH EXTRACTION      ROS- all systems are reviewed and negatives except as per HPI above  Current Outpatient Medications  Medication Sig Dispense Refill  . acetaminophen (TYLENOL) 500 MG tablet Take 500 mg by mouth every 6 (six) hours as needed for moderate pain or headache.    Marland Kitchen aspirin 325 MG tablet Take 325 mg by mouth daily.    . Calcium-Vitamin D (CALTRATE 600 PLUS-VIT D PO) Take 1 tablet by mouth daily.     . cholecalciferol (VITAMIN D) 1000 UNITS tablet Take 1,000 Units by mouth daily.    . clonazePAM (KLONOPIN) 1 MG tablet Take 1.5 mg to 2 mg by mouth daily.    Marland Kitchen diltiazem (CARDIZEM CD) 240 MG 24 hr capsule Take 1 capsule (240 mg total) by mouth daily. 90 capsule 3  . FLUoxetine (PROZAC) 20 MG capsule Take 20 mg by mouth daily.     . furosemide (  LASIX) 20 MG tablet Take 20 mg by mouth daily.    Marland Kitchen oxybutynin (DITROPAN-XL) 10 MG 24 hr tablet Take 10 mg by mouth daily.     . potassium chloride SA (K-DUR,KLOR-CON) 20 MEQ tablet Take 20 mEq by mouth daily.    . vitamin B-12 (CYANOCOBALAMIN) 1000 MCG tablet Take 1,000 mcg by mouth daily.    . vitamin C (ASCORBIC ACID) 500 MG tablet Take 500 mg by mouth daily.    . vitamin E (VITAMIN E) 400 UNIT capsule Take 400 Units daily by mouth.     No current facility-administered medications for this visit.     Physical Exam: Vitals:   03/09/17 1132  BP: 124/68  Pulse: (!) 102  SpO2: 99%  Weight: 190 lb 12.8 oz (86.5 kg)  Height: 5' 5.25" (1.657 m)    GEN-  The patient is well appearing, alert but agitated Head- normocephalic, atraumatic Eyes-  Sclera clear, conjunctiva pink Ears- hearing intact Oropharynx- clear Lungs- Clear to ausculation bilaterally, normal work of breathing Heart- irregular rate and rhythm, no murmurs, rubs or gallops, PMI not laterally displaced GI- soft, NT, ND, + BS Extremities- no clubbing, cyanosis, or edema  EKG tracing ordered today is personally reviewed and shows afib, V rate 102 bpm, Qtc 477 msec, septal infarct pattern  Assessment and Plan:  1. Persistent afib S/p watchman Unfortunately, she is back in afib.   She appears to have failed medical therapy with tikosyn.  I will therefore stop tikosyn today and increase diltiazem CD to 240mg  daily for better rate control.  Options going forward could be afib ablation or AV nodal ablation with PPM if she does not do well with rate control alone. Bmet, mg today.  2. HTN Stable No change required today  3. Chronic diastolic dysfunction Stable No change required today  4. Disposition  Today, Ms Blahut has multiple complaints about her care.  She states that she "does not feel that anyone cares about her" and "doesnt even know who her doctors are".  She is upset that her visit with me in December was cancelled due to our practice being closed for snow.  She feels that I should have seen her prior to today.  I reminded her that she was recently seen in the AF clinic and has been in close communication with them about her care.  She states "I just need to find another doctor".  When I tried to assure her that our team was very willing and interested in her care, she became even more hostile.   This has unfortunately become a pattern with Ms Perezperez.  She has demonstrated a poor regard for the doctor-patient relationship with me over the past few years despite my willingness to care for her and even recently seeing her in the ED.  I feel that my ability to provide the care that  she desires is very limited by her hostile and at times disrespectful attitude.  As she has made it very clear that she is no longer interested in being my patient, I have per her request discharged her from my practice today.  I offered that she could be followed by EP at [facility] or [facility] (as examples).  I have offered that she can discuss with Dr Tamala Julian as to his opinion as well.  As she has been discharged from my practice, I would not advise that she be allowed to see EP physicians with [facility] except for extenuating circumstances.   Jeneen Rinks  Kamela Blansett MD, [facility] 03/09/2017 9:13 PM

## 2017-03-10 LAB — BASIC METABOLIC PANEL
BUN / CREAT RATIO: 27 (ref 12–28)
BUN: 23 mg/dL (ref 8–27)
CO2: 25 mmol/L (ref 20–29)
CREATININE: 0.86 mg/dL (ref 0.57–1.00)
Calcium: 9.5 mg/dL (ref 8.7–10.3)
Chloride: 101 mmol/L (ref 96–106)
GFR, EST AFRICAN AMERICAN: 75 mL/min/{1.73_m2} (ref >59)
GFR, EST NON AFRICAN AMERICAN: 65 mL/min/{1.73_m2} (ref >59)
GLUCOSE: 94 mg/dL (ref 65–99)
Potassium: 4.9 mmol/L (ref 3.5–5.2)
Sodium: 143 mmol/L (ref 134–144)

## 2017-03-10 LAB — MAGNESIUM: Magnesium: 2.1 mg/dL (ref 1.6–2.3)

## 2017-03-13 ENCOUNTER — Telehealth: Payer: Self-pay | Admitting: Interventional Cardiology

## 2017-03-13 ENCOUNTER — Encounter: Payer: Self-pay | Admitting: Interventional Cardiology

## 2017-03-13 DIAGNOSIS — I4891 Unspecified atrial fibrillation: Secondary | ICD-10-CM

## 2017-03-13 DIAGNOSIS — I480 Paroxysmal atrial fibrillation: Secondary | ICD-10-CM

## 2017-03-13 NOTE — Telephone Encounter (Signed)
Per Dr. Tamala Julian- refer to Dr. Rosanne Ashing for EP.  Referral placed.

## 2017-03-14 ENCOUNTER — Telehealth: Payer: Self-pay | Admitting: Internal Medicine

## 2017-03-14 NOTE — Telephone Encounter (Signed)
Last 2 years of Cardiac records faxed to [facility]. Per Cardiac Referral.

## 2017-03-19 DIAGNOSIS — I480 Paroxysmal atrial fibrillation: Secondary | ICD-10-CM | POA: Diagnosis not present

## 2017-03-19 DIAGNOSIS — F329 Major depressive disorder, single episode, unspecified: Secondary | ICD-10-CM | POA: Diagnosis not present

## 2017-03-19 DIAGNOSIS — I10 Essential (primary) hypertension: Secondary | ICD-10-CM | POA: Diagnosis not present

## 2017-03-23 DIAGNOSIS — I83892 Varicose veins of left lower extremities with other complications: Secondary | ICD-10-CM | POA: Diagnosis not present

## 2017-03-23 DIAGNOSIS — I8312 Varicose veins of left lower extremity with inflammation: Secondary | ICD-10-CM | POA: Diagnosis not present

## 2017-03-28 ENCOUNTER — Encounter: Payer: Self-pay | Admitting: Interventional Cardiology

## 2017-04-04 DIAGNOSIS — I83892 Varicose veins of left lower extremities with other complications: Secondary | ICD-10-CM | POA: Diagnosis not present

## 2017-04-04 DIAGNOSIS — I8312 Varicose veins of left lower extremity with inflammation: Secondary | ICD-10-CM | POA: Diagnosis not present

## 2017-04-09 ENCOUNTER — Encounter: Payer: Self-pay | Admitting: Interventional Cardiology

## 2017-04-12 ENCOUNTER — Ambulatory Visit (INDEPENDENT_AMBULATORY_CARE_PROVIDER_SITE_OTHER)
Admission: RE | Admit: 2017-04-12 | Discharge: 2017-04-12 | Disposition: A | Discharge status: Home / Self Care | Payer: Medicare Other | Source: Ambulatory Visit | Attending: Emergency Medicine | Admitting: Emergency Medicine

## 2017-04-12 DIAGNOSIS — R918 Other nonspecific abnormal finding of lung field: Secondary | ICD-10-CM | POA: Diagnosis not present

## 2017-04-12 DIAGNOSIS — R911 Solitary pulmonary nodule: Secondary | ICD-10-CM

## 2017-04-16 DIAGNOSIS — I8312 Varicose veins of left lower extremity with inflammation: Secondary | ICD-10-CM | POA: Diagnosis not present

## 2017-04-16 DIAGNOSIS — I83892 Varicose veins of left lower extremities with other complications: Secondary | ICD-10-CM | POA: Diagnosis not present

## 2017-04-18 ENCOUNTER — Ambulatory Visit (INDEPENDENT_AMBULATORY_CARE_PROVIDER_SITE_OTHER): Payer: Medicare Other | Admitting: Emergency Medicine

## 2017-04-18 ENCOUNTER — Encounter: Payer: Self-pay | Admitting: Emergency Medicine

## 2017-04-18 DIAGNOSIS — R9389 Abnormal findings on diagnostic imaging of other specified body structures: Secondary | ICD-10-CM

## 2017-04-18 NOTE — Progress Notes (Signed)
History of Present Illness Judy White is a 77 y.o. female current smoker with recent PE started on Pradaxa 10/07/15, COPD, HTN, paroxysmal-A.Fib, patient previously not on blood thinners despite CHADS vasc of 4 due to history of brain bleed. She has a Watchman device in her left atrial appendage.    HPI: 77 yo woman with hx A fib and watchman placement, on amiodarone but not anti-coag since 01/2015 due to concern regarding hx of SDH in 2007. Also w hx HTN, tobacco use and COPD. She was admitted with light-headedness, near-syncope 8/11, found to be hypoxemic. CT chest 8/11 showed scattered RLL PE's, some evidence for interstitial edema and GGI. LE doppler study showed RLE DVT. She was been started on therapeutic enoxaparin.and transitioned to Pradaxa at discharge. She was discharged home on oxygen. Amiodarone was stopped. She stopped smoking after the hospitalization.   She reports that she had a hospitalization beginning of October for atrial fibrillation. Her breathing has improved some. She has fatigue, no new resp sx. No new exposures. Not smoking. No mold exposure. No pets / no birds or bats.   She underwent a CT scan of her chest on 12/16/15 that I personally reviewed. This showed changes in the pre-existing groundglass inflammation that had been seen on her August CT scan. Much of this has cleared but now there is significant scattered more consolidated infiltrate and a loose nodular pattern. Most notable was in the lingula 3.9 x 2.2 cm.  Also noted was some possible right hilar lymphadenopathy.    ROV 01/03/16 -- the patient returns for evaluation of her history of pulmonary embolism, abnormal CT scan of the chest, former tobacco use.. As detailed above she has scattered loose nodular disease on CT scan that we will likely need BAL and biopsy. She is currently undergoing evaluation for initiation of Tykosyn and I would like for her to have this done before we performed bronchoscopy. She underwent  pulmonary function testing 12/31/15 and I personally reviewed the results. The spirometry shows mixed obstruction and restriction without a bronchodilator response. Lung volumes are restricted. Diffusion capacity is decreased and does not fully correct when adjusted for alveolar volume.   ROV 02/15/16 -- this follow-up visit for evaluation of an abnormal CT scan of the chest with scattered loose nodular disease noted on scan from hospitalization in August when she was diagnosed with pulmonary embolism. A follow-up scan 12/16/15 showed some changes but persistence of the scattered nodular consolidations. Based on this we performed a bronchoscopy on 01/24/16. BAL showed 92% neutrophils, WBC 900, cytology negative for malignancy, transbronchial biopsies negative for granulomas or malignancy. AFB and fungal cultures negative. Unfortunately course was complicated by a left-sided pneumothorax for which she required pigtail chest tube. She is currently on pradaxa, tolerating well. She is still planning to discuss possible Tikosyn with cardiology/ EP.  She is doing a trial of Spiriva for the last month - unsure whether it has helped her in any way, breathing feels the same.   ROV 04/12/16 -- is a follow-up visit for patient with a history of pulmonary malaise him and also an abnormal CT scan of the chest with some scattered loose nodular disease. Bronchoscopy 01/24/16 was nondiagnostic. As today to follow-up a repeat CT scan that was done on 04/05/16 that I personally reviewed. As a significant improvement in her previously identified areas of consolidation and groundglass. There are some residual areas that look like possible evolving scar. There is a 7 mm left lower lobe nodule  that is stable. She has adjusted her Pradaxa, decreased it, then changed to eliquis but only once a day.   ROV 04/18/17 --77 year old woman with a history of PE, abnormal CT scan of the chest with nodular disease.  Bronchoscopy 01/24/16 was  reassuring, nondiagnostic.  She is now off of anticoagulation.  We are now following a 7 mm left lower lobe nodule.  CT scan of the chest was done on 04/12/17 and I have reviewed.  It shows that the left lower lobe nodular opacities slightly smaller than last year.  No new opacities seen.    Review Of Systems: As per HPI   Vital Signs BP 110/70 (BP Location: Left Arm, Cuff Size: Normal)   Pulse 60   Ht 5\' 5"  (1.651 m)   Wt 193 lb (87.5 kg)   SpO2 92%   BMI 32.12 kg/m   Vitals:   04/18/17 1435 04/18/17 1436  BP:  110/70  Pulse:  60  SpO2:  92%  Weight: 193 lb (87.5 kg)   Height: 5\' 5"  (1.651 m)    Physical Exam:  General- No distress,  A&Ox3, pleasant ENT: No sinus tenderness, TM clear, pale nasal mucosa, no oral exudate,no post nasal drip, no LAN Cardiac: S1, S2, regular rate and rhythm, no murmur Chest: No wheeze/ + crackles / dullness; no accessory muscle use, no nasal flaring, no sternal retractions Abd.: Soft Non-tender Ext: No clubbing cyanosis, edema Neuro:  normal strength Skin: No rashes, warm and dry Psych: normal mood and behavior   Assessment/Plan  Abnormal CT of the chest Groundglass infiltrates, interstitial disease improved as of last year.  Her most recent CT scan shows that her left lower lobe pulmonary nodule is actually smaller.  This is inconsistent with malignancy.  I do not think she needs a repeat scan unless she changes clinically.  This is good news.  Based on the improvement that we have see on your serial CT scans, you should not need a repeat CT scan to follow pulmonary nodule unless you develop new symptoms.  Follow with Dr Lamonte Sakai in 12 months or sooner if you have any problems    Baltazar Apo, MD, PhD 04/18/2017, 2:57 PM [facility] [phone] or if no answer [phone]

## 2017-04-18 NOTE — Patient Instructions (Signed)
Based on the improvement that we have see on your serial CT scans, you should not need a repeat CT scan to follow pulmonary nodule unless you develop new symptoms.  Follow with Dr Lamonte Sakai in 12 months or sooner if you have any problems

## 2017-04-18 NOTE — Assessment & Plan Note (Signed)
Groundglass infiltrates, interstitial disease improved as of last year.  Her most recent CT scan shows that her left lower lobe pulmonary nodule is actually smaller.  This is inconsistent with malignancy.  I do not think she needs a repeat scan unless she changes clinically.  This is good news.  Based on the improvement that we have see on your serial CT scans, you should not need a repeat CT scan to follow pulmonary nodule unless you develop new symptoms.  Follow with Dr Lamonte Sakai in 12 months or sooner if you have any problems

## 2017-05-01 NOTE — Progress Notes (Signed)
Cardiology Office Note    Date:  05/02/2017   ID:  JERALDINE PRIMEAU, DOB May 14, 1940, MRN [id number]  PCP:  Josetta Huddle, MD  Cardiologist: Sinclair Grooms, MD   Chief Complaint  Patient presents with  . Atrial Fibrillation    History of Present Illness:  Judy White is a 77 y.o. female who presents for paroxysmal atrial fibrillation, amiodarone therapy, diastolic heart failure, unable to use anticoagulation due to intracranial hemorrhage, essential hypertension,and anxiety disorder.  Accompanied by her husband.  Complicated situation with the patient is failed multiple therapeutic attempts at control of atrial fibrillation rhythm.  Amiodarone and dofetilide have been tried.  She does have a left atrial occluding device placed.  He is unable to use anticoagulation therapy due to prior intracranial hemorrhage.  In the past she has been willing to try it short-term.  She was seen by Dr. Rayann Heman to be considered for advanced atrial fibrillation therapies such as ablation but it was ultimately decided that that would be futile.  Finally the patient and Dr. Rayann Heman were not able to coexist in a patient Dr. relationship.  She has been discharged from the electrophysiology service within our practice.  She currently remains in atrial fibrillation with poor ventricular response.  She is on diltiazem 240 mg/day.  Her heart rates are generally fast.  She has dyspnea on exertion.  She has some lower extremity swelling.  She denies orthopnea.  She has not had syncope.  An attempt to refer her to [facility], Dr. Omelia Blackwater was made 6 weeks ago but apparently she has not been contacted.  According to our records the attempts to make the consult have been "closed".  Past Surgical History:  Procedure Laterality Date  . APPENDECTOMY  1983  . BACK SURGERY    . BRONCHOSCOPY  01/24/2016  . CARDIAC CATHETERIZATION     >5 yrs  . CATARACT EXTRACTION W/ INTRAOCULAR LENS  IMPLANT, BILATERAL Bilateral    . CESAREAN SECTION  1967  . LEFT ATRIAL APPENDAGE OCCLUSION N/A 12/31/2014   Procedure: LEFT ATRIAL APPENDAGE OCCLUSION;  Surgeon: Thompson Grayer, MD;  Location: [place] [facility];  Service: Cardiovascular;  Laterality: N/A;  . POSTERIOR FUSION LUMBAR SPINE Bilateral 07/2014  . SHOULDER ARTHROSCOPY W/ ROTATOR CUFF REPAIR Left   . SHOULDER SURGERY Right    "tendon broke; couldn't be repaired"  . TEE WITHOUT CARDIOVERSION N/A 12/22/2014   Procedure: TRANSESOPHAGEAL ECHOCARDIOGRAM (TEE);  Surgeon: Pixie Casino, MD;  Location: [facility] ENDOSCOPY;  Service: Cardiovascular;  Laterality: N/A;  . TEE WITHOUT CARDIOVERSION N/A 02/16/2015   post Watchman TEE with good seal and no leak around device   . TONSILLECTOMY    . TOTAL ABDOMINAL HYSTERECTOMY    . VIDEO BRONCHOSCOPY Bilateral 01/24/2016   Procedure: VIDEO BRONCHOSCOPY WITH FLUORO;  Surgeon: Collene Gobble, MD;  Location: [facility];  Service: Cardiopulmonary;  Laterality: Bilateral;  . WISDOM TOOTH EXTRACTION      Current Medications: Outpatient Medications Prior to Visit  Medication Sig Dispense Refill  . acetaminophen (TYLENOL) 500 MG tablet Take 500 mg by mouth every 6 (six) hours as needed for moderate pain or headache.    Marland Kitchen aspirin 325 MG tablet Take 325 mg by mouth daily.    . Calcium-Vitamin D (CALTRATE 600 PLUS-VIT D PO) Take 1 tablet by mouth daily.     . Cholecalciferol (VITAMIN D3) 2000 units TABS Take 2,000 Units by mouth daily.    . clonazePAM (KLONOPIN) 1  MG tablet Take 1.5 mg to 2 mg by mouth daily.    Marland Kitchen diltiazem (CARDIZEM CD) 240 MG 24 hr capsule Take 1 capsule (240 mg total) by mouth daily. 90 capsule 3  . FLUoxetine (PROZAC) 20 MG capsule Take 20 mg by mouth daily.     . furosemide (LASIX) 20 MG tablet Take 20 mg by mouth daily.    Marland Kitchen oxybutynin (DITROPAN-XL) 10 MG 24 hr tablet Take 10 mg by mouth daily.     . Potassium 99 MG TABS Take 99 mg by mouth daily.    . vitamin B-12 (CYANOCOBALAMIN) 1000 MCG tablet Take 1,000 mcg  by mouth daily.    . vitamin C (ASCORBIC ACID) 500 MG tablet Take 500 mg by mouth daily.    . vitamin E (VITAMIN E) 400 UNIT capsule Take 400 Units daily by mouth.    . cholecalciferol (VITAMIN D) 1000 UNITS tablet Take 1,000 Units by mouth daily.    . potassium chloride SA (K-DUR,KLOR-CON) 20 MEQ tablet Take 20 mEq by mouth daily.     No facility-administered medications prior to visit.      Allergies:   Patient has no known allergies.   Social History   Socioeconomic History  . Marital status: Married    Spouse name: None  . Number of children: None  . Years of education: None  . Highest education level: None  Social Needs  . Financial resource strain: None  . Food insecurity - worry: None  . Food insecurity - inability: None  . Transportation needs - medical: None  . Transportation needs - non-medical: None  Occupational History  . None  Tobacco Use  . Smoking status: Former Smoker    Packs/day: 0.50    Years: 60.00    Pack years: 30.00    Types: Cigarettes    Last attempt to quit: 10/08/2015    Years since quitting: 1.5  . Smokeless tobacco: Never Used  Substance and Sexual Activity  . Alcohol use: Yes    Alcohol/week: 0.0 oz    Comment: 01/24/2016 "don't drink at all right now cause of my RX; social drinker before now"  . Drug use: No  . Sexual activity: No  Other Topics Concern  . None  Social History Narrative  . None     Family History:  The patient's family history includes Aneurysm in her father; Cancer in her unknown relative; Heart Problems in her unknown relative; Heart disease in her brother; Hypotension in her mother; Neuropathy in her brother.   ROS:   Please see the history of present illness.    Anxiety, anger, PND, and lower extremity puffiness. All other systems reviewed and are negative.   PHYSICAL EXAM:   VS:  BP 126/72   Pulse 67   Ht 5\' 5"  (1.651 m)   Wt 191 lb (86.6 kg)   BMI 31.78 kg/m    Heart rate on auscultation is greater  than 100 and with any activity increases greater than 130 bpm. GEN: Well nourished, well developed, in no acute distress  HEENT: normal  Neck: no JVD, carotid bruits, or masses Cardiac: IIRR with rapid rate; no murmurs, rubs, or gallops,no edema  Respiratory:  clear to auscultation bilaterally, normal work of breathing GI: soft, nontender, nondistended, + BS MS: no deformity or atrophy  Skin: warm and dry, no rash Neuro:  Alert and Oriented x 3, Strength and sensation are intact Psych: euthymic mood, full affect  Wt Readings from Last 3 Encounters:  05/02/17 191 lb (86.6 kg)  04/18/17 193 lb (87.5 kg)  03/09/17 190 lb 12.8 oz (86.5 kg)      Studies/Labs Reviewed:   EKG:  EKG  Not repeated.  Recent Labs: 01/03/2017: Hemoglobin 14.5; Platelets 221 03/09/2017: BUN 23; Creatinine, Ser 0.86; Magnesium 2.1; Potassium 4.9; Sodium 143   Lipid Panel No results found for: CHOL, TRIG, HDL, CHOLHDL, VLDL, LDLCALC, LDLDIRECT  Additional studies/ records that were reviewed today include:  None    ASSESSMENT:    1. Persistent atrial fibrillation ([place])   2. Chronic diastolic heart failure (HCC)   3. Long-term current use of high risk medication other than anticoagulant   4. Hypercholesteremia   5. Interstitial lung disease ([place])   6. Typical atrial flutter (HCC)      PLAN:  In order of problems listed above:  1. Persistent atrial fibrillation with poor rate control despite diltiazem 240 mg/day.  Plan to further rate control by adding low-dose digoxin 0.125 mg daily.  We will again attempt to refer the patient to Dr. Omelia Blackwater.  Not sure what happened with at the process.  Patient does tell me that she does not always answer her phone because she gets a lot of [place] calls.  Explained to the patient that she is no longer able to see the electrophysiologist in our practice.  If she would like to remain a General cardiology patient because of convenience.  She has no other real alternatives  that she will acknowledge.  She and her husband are elderly and have difficulty driving distances.  Perhaps atrial fibrillation ablation or AV nodal ablation will be better treatments which could help improve her clinical status. 2. Dyspnea on exertion likely related to diastolic dysfunction from atrial fibrillation and poor rate control 3. She is unable to tolerate dofetilide and amiodarone.  She is not on anticoagulation because of previous life threatening bleeding (intracranial hemorrhage) 4. Not addressed 5. Not addressed 6. Progress  Digoxin has been added to diltiazem.  She is cautioned to call if nausea, anorexia, or halos.  Dig level in 1 week.  Referral to [facility] electrophysiology department, Dr. Omelia Blackwater.  Medication Adjustments/Labs and Tests Ordered: Current medicines are reviewed at length with the patient today.  Concerns regarding medicines are outlined above.  Medication changes, Labs and Tests ordered today are listed in the Patient Instructions below. Patient Instructions  Medication Instructions:  1) START Digoxin 0.125mg  once daily.  Please notify the office if you notice yellow halos around lights or loss of appetite.   Labwork: Your physician recommends that you return for lab work in: 2 weeks (Digoxin level)   Testing/Procedures: None  Follow-Up: Your physician wants you to follow-up in: 6 months with Dr. Tamala Julian.  You will receive a reminder letter in the mail two months in advance. If you don't receive a letter, please call our office to schedule the follow-up appointment.  You have been referred to Dr. Westly Pam at [facility] for EP follow up.  Any Other Special Instructions Will Be Listed Below (If Applicable).     If you need a refill on your cardiac medications before your next appointment, please call your pharmacy.      Signed, Sinclair Grooms, MD  05/02/2017 5:29 PM    [facility] [address],  [place], [place]  [address] Phone: [phone]; Fax: [phone]

## 2017-05-02 ENCOUNTER — Encounter: Payer: Self-pay | Admitting: Interventional Cardiology

## 2017-05-02 ENCOUNTER — Ambulatory Visit (INDEPENDENT_AMBULATORY_CARE_PROVIDER_SITE_OTHER): Payer: Medicare Other | Admitting: Interventional Cardiology

## 2017-05-02 VITALS — BP 126/72 | HR 67 | Ht 65.0 in | Wt 191.0 lb

## 2017-05-02 DIAGNOSIS — J849 Interstitial pulmonary disease, unspecified: Secondary | ICD-10-CM | POA: Diagnosis not present

## 2017-05-02 DIAGNOSIS — Z79899 Other long term (current) drug therapy: Secondary | ICD-10-CM

## 2017-05-02 DIAGNOSIS — I481 Persistent atrial fibrillation: Secondary | ICD-10-CM | POA: Diagnosis not present

## 2017-05-02 DIAGNOSIS — I483 Typical atrial flutter: Secondary | ICD-10-CM

## 2017-05-02 DIAGNOSIS — E7800 Pure hypercholesterolemia, unspecified: Secondary | ICD-10-CM | POA: Diagnosis not present

## 2017-05-02 DIAGNOSIS — I5032 Chronic diastolic (congestive) heart failure: Secondary | ICD-10-CM

## 2017-05-02 DIAGNOSIS — I4819 Other persistent atrial fibrillation: Secondary | ICD-10-CM

## 2017-05-02 MED ORDER — DIGOXIN 125 MCG PO TABS: 0.1250 mg | ORAL_TABLET | Freq: Every day | ORAL | 3 refills | Status: DC

## 2017-05-02 NOTE — Patient Instructions (Signed)
Medication Instructions:  1) START Digoxin 0.125mg  once daily.  Please notify the office if you notice yellow halos around lights or loss of appetite.   Labwork: Your physician recommends that you return for lab work in: 2 weeks (Digoxin level)   Testing/Procedures: None  Follow-Up: Your physician wants you to follow-up in: 6 months with Dr. Tamala Julian.  You will receive a reminder letter in the mail two months in advance. If you don't receive a letter, please call our office to schedule the follow-up appointment.  You have been referred to Dr. Westly Pam at [facility] for EP follow up.  Any Other Special Instructions Will Be Listed Below (If Applicable).     If you need a refill on your cardiac medications before your next appointment, please call your pharmacy.

## 2017-05-04 ENCOUNTER — Telehealth: Payer: Self-pay | Admitting: Interventional Cardiology

## 2017-05-04 ENCOUNTER — Encounter: Payer: Self-pay | Admitting: Interventional Cardiology

## 2017-05-04 NOTE — Telephone Encounter (Signed)
Mrs.Lahaie is calling to give you the correct fax number to  Dr. Omelia Blackwater at [facility] . The [phone]. Please call if you have any questions   Thanks

## 2017-05-07 NOTE — Telephone Encounter (Signed)
Another nurse faxed over referral notes. Pt has since written back to let us know she has an appt with Dr. Omelia Blackwater at [facility] in April.

## 2017-05-14 DIAGNOSIS — I481 Persistent atrial fibrillation: Secondary | ICD-10-CM | POA: Diagnosis not present

## 2017-05-14 DIAGNOSIS — Z79899 Other long term (current) drug therapy: Secondary | ICD-10-CM | POA: Diagnosis not present

## 2017-05-14 DIAGNOSIS — I4891 Unspecified atrial fibrillation: Secondary | ICD-10-CM | POA: Diagnosis not present

## 2017-05-14 DIAGNOSIS — I2782 Chronic pulmonary embolism: Secondary | ICD-10-CM | POA: Diagnosis not present

## 2017-05-14 DIAGNOSIS — I4581 Long QT syndrome: Secondary | ICD-10-CM | POA: Diagnosis not present

## 2017-05-14 DIAGNOSIS — Z95818 Presence of other cardiac implants and grafts: Secondary | ICD-10-CM | POA: Diagnosis not present

## 2017-05-14 DIAGNOSIS — I4892 Unspecified atrial flutter: Secondary | ICD-10-CM | POA: Diagnosis not present

## 2017-05-14 DIAGNOSIS — R0600 Dyspnea, unspecified: Secondary | ICD-10-CM | POA: Diagnosis not present

## 2017-05-14 DIAGNOSIS — I484 Atypical atrial flutter: Secondary | ICD-10-CM | POA: Diagnosis not present

## 2017-05-14 DIAGNOSIS — R000 Tachycardia, unspecified: Secondary | ICD-10-CM | POA: Diagnosis not present

## 2017-05-14 DIAGNOSIS — R5383 Other fatigue: Secondary | ICD-10-CM | POA: Diagnosis not present

## 2017-05-14 DIAGNOSIS — Z5181 Encounter for therapeutic drug level monitoring: Secondary | ICD-10-CM | POA: Diagnosis not present

## 2017-05-15 ENCOUNTER — Encounter: Payer: Self-pay | Admitting: Interventional Cardiology

## 2017-05-16 ENCOUNTER — Other Ambulatory Visit: Payer: Medicare Other | Admitting: *Deleted

## 2017-05-16 DIAGNOSIS — Z79899 Other long term (current) drug therapy: Secondary | ICD-10-CM | POA: Diagnosis not present

## 2017-05-16 LAB — DIGOXIN LEVEL: Digoxin, Serum: 0.5 ng/mL (ref 0.5–0.9)

## 2017-05-17 DIAGNOSIS — I10 Essential (primary) hypertension: Secondary | ICD-10-CM | POA: Diagnosis not present

## 2017-05-17 DIAGNOSIS — F325 Major depressive disorder, single episode, in full remission: Secondary | ICD-10-CM | POA: Diagnosis not present

## 2017-05-17 DIAGNOSIS — M179 Osteoarthritis of knee, unspecified: Secondary | ICD-10-CM | POA: Diagnosis not present

## 2017-05-17 DIAGNOSIS — I480 Paroxysmal atrial fibrillation: Secondary | ICD-10-CM | POA: Diagnosis not present

## 2017-05-17 DIAGNOSIS — E782 Mixed hyperlipidemia: Secondary | ICD-10-CM | POA: Diagnosis not present

## 2017-05-18 ENCOUNTER — Encounter: Payer: Self-pay | Admitting: Interventional Cardiology

## 2017-06-01 ENCOUNTER — Encounter: Payer: Self-pay | Admitting: Interventional Cardiology

## 2017-06-02 DIAGNOSIS — Z5181 Encounter for therapeutic drug level monitoring: Secondary | ICD-10-CM | POA: Diagnosis not present

## 2017-06-02 DIAGNOSIS — Z79899 Other long term (current) drug therapy: Secondary | ICD-10-CM | POA: Diagnosis not present

## 2017-06-02 DIAGNOSIS — I483 Typical atrial flutter: Secondary | ICD-10-CM | POA: Diagnosis not present

## 2017-06-02 DIAGNOSIS — I481 Persistent atrial fibrillation: Secondary | ICD-10-CM | POA: Diagnosis not present

## 2017-06-18 DIAGNOSIS — Z5181 Encounter for therapeutic drug level monitoring: Secondary | ICD-10-CM | POA: Diagnosis not present

## 2017-06-18 DIAGNOSIS — I619 Nontraumatic intracerebral hemorrhage, unspecified: Secondary | ICD-10-CM | POA: Diagnosis not present

## 2017-06-18 DIAGNOSIS — Z01810 Encounter for preprocedural cardiovascular examination: Secondary | ICD-10-CM | POA: Diagnosis not present

## 2017-06-18 DIAGNOSIS — Z79899 Other long term (current) drug therapy: Secondary | ICD-10-CM | POA: Diagnosis not present

## 2017-06-18 DIAGNOSIS — Z9889 Other specified postprocedural states: Secondary | ICD-10-CM | POA: Diagnosis not present

## 2017-06-18 DIAGNOSIS — Z87891 Personal history of nicotine dependence: Secondary | ICD-10-CM | POA: Diagnosis not present

## 2017-06-18 DIAGNOSIS — J449 Chronic obstructive pulmonary disease, unspecified: Secondary | ICD-10-CM | POA: Diagnosis not present

## 2017-06-18 DIAGNOSIS — I4892 Unspecified atrial flutter: Secondary | ICD-10-CM | POA: Diagnosis not present

## 2017-06-18 DIAGNOSIS — I4891 Unspecified atrial fibrillation: Secondary | ICD-10-CM | POA: Diagnosis not present

## 2017-06-18 DIAGNOSIS — I481 Persistent atrial fibrillation: Secondary | ICD-10-CM | POA: Diagnosis not present

## 2017-06-19 ENCOUNTER — Other Ambulatory Visit: Payer: Self-pay | Admitting: Interventional Cardiology

## 2017-06-27 DIAGNOSIS — Z8679 Personal history of other diseases of the circulatory system: Secondary | ICD-10-CM | POA: Diagnosis not present

## 2017-06-27 DIAGNOSIS — I484 Atypical atrial flutter: Secondary | ICD-10-CM | POA: Diagnosis not present

## 2017-06-27 DIAGNOSIS — Z7901 Long term (current) use of anticoagulants: Secondary | ICD-10-CM | POA: Diagnosis not present

## 2017-06-27 DIAGNOSIS — I481 Persistent atrial fibrillation: Secondary | ICD-10-CM | POA: Diagnosis not present

## 2017-06-28 DIAGNOSIS — I471 Supraventricular tachycardia: Secondary | ICD-10-CM | POA: Diagnosis not present

## 2017-06-28 DIAGNOSIS — Z87891 Personal history of nicotine dependence: Secondary | ICD-10-CM | POA: Diagnosis not present

## 2017-06-28 DIAGNOSIS — Z86711 Personal history of pulmonary embolism: Secondary | ICD-10-CM | POA: Diagnosis not present

## 2017-06-28 DIAGNOSIS — Z79899 Other long term (current) drug therapy: Secondary | ICD-10-CM | POA: Diagnosis not present

## 2017-06-28 DIAGNOSIS — I483 Typical atrial flutter: Secondary | ICD-10-CM | POA: Diagnosis not present

## 2017-06-28 DIAGNOSIS — I480 Paroxysmal atrial fibrillation: Secondary | ICD-10-CM | POA: Diagnosis not present

## 2017-06-28 DIAGNOSIS — E668 Other obesity: Secondary | ICD-10-CM | POA: Diagnosis not present

## 2017-06-28 DIAGNOSIS — Z6831 Body mass index (BMI) 31.0-31.9, adult: Secondary | ICD-10-CM | POA: Diagnosis not present

## 2017-06-28 DIAGNOSIS — G8929 Other chronic pain: Secondary | ICD-10-CM | POA: Diagnosis not present

## 2017-06-28 DIAGNOSIS — Z86718 Personal history of other venous thrombosis and embolism: Secondary | ICD-10-CM | POA: Diagnosis not present

## 2017-06-28 DIAGNOSIS — I484 Atypical atrial flutter: Secondary | ICD-10-CM | POA: Diagnosis not present

## 2017-06-28 DIAGNOSIS — Z95818 Presence of other cardiac implants and grafts: Secondary | ICD-10-CM | POA: Diagnosis not present

## 2017-06-28 DIAGNOSIS — J449 Chronic obstructive pulmonary disease, unspecified: Secondary | ICD-10-CM | POA: Diagnosis not present

## 2017-06-28 DIAGNOSIS — Z90710 Acquired absence of both cervix and uterus: Secondary | ICD-10-CM | POA: Diagnosis not present

## 2017-06-28 DIAGNOSIS — I481 Persistent atrial fibrillation: Secondary | ICD-10-CM | POA: Diagnosis not present

## 2017-06-28 HISTORY — PX: ATRIAL FIBRILLATION ABLATION: EP1191

## 2017-06-28 HISTORY — PX: CARDIAC CATHETERIZATION: SHX172

## 2017-06-29 DIAGNOSIS — Z86711 Personal history of pulmonary embolism: Secondary | ICD-10-CM | POA: Diagnosis not present

## 2017-06-29 DIAGNOSIS — J449 Chronic obstructive pulmonary disease, unspecified: Secondary | ICD-10-CM | POA: Diagnosis not present

## 2017-06-29 DIAGNOSIS — I483 Typical atrial flutter: Secondary | ICD-10-CM | POA: Diagnosis not present

## 2017-06-29 DIAGNOSIS — Z95818 Presence of other cardiac implants and grafts: Secondary | ICD-10-CM | POA: Diagnosis not present

## 2017-06-29 DIAGNOSIS — I481 Persistent atrial fibrillation: Secondary | ICD-10-CM | POA: Diagnosis not present

## 2017-06-29 DIAGNOSIS — I471 Supraventricular tachycardia: Secondary | ICD-10-CM | POA: Diagnosis not present

## 2017-06-29 DIAGNOSIS — I484 Atypical atrial flutter: Secondary | ICD-10-CM | POA: Diagnosis not present

## 2017-06-29 DIAGNOSIS — I480 Paroxysmal atrial fibrillation: Secondary | ICD-10-CM | POA: Diagnosis not present

## 2017-07-01 ENCOUNTER — Encounter: Payer: Self-pay | Admitting: Interventional Cardiology

## 2017-07-03 DIAGNOSIS — E782 Mixed hyperlipidemia: Secondary | ICD-10-CM | POA: Diagnosis not present

## 2017-07-03 DIAGNOSIS — F325 Major depressive disorder, single episode, in full remission: Secondary | ICD-10-CM | POA: Diagnosis not present

## 2017-07-03 DIAGNOSIS — M8580 Other specified disorders of bone density and structure, unspecified site: Secondary | ICD-10-CM | POA: Diagnosis not present

## 2017-07-03 DIAGNOSIS — R600 Localized edema: Secondary | ICD-10-CM | POA: Diagnosis not present

## 2017-07-03 DIAGNOSIS — R0989 Other specified symptoms and signs involving the circulatory and respiratory systems: Secondary | ICD-10-CM | POA: Diagnosis not present

## 2017-07-03 DIAGNOSIS — I480 Paroxysmal atrial fibrillation: Secondary | ICD-10-CM | POA: Diagnosis not present

## 2017-07-03 DIAGNOSIS — M179 Osteoarthritis of knee, unspecified: Secondary | ICD-10-CM | POA: Diagnosis not present

## 2017-07-03 DIAGNOSIS — I10 Essential (primary) hypertension: Secondary | ICD-10-CM | POA: Diagnosis not present

## 2017-07-03 DIAGNOSIS — I509 Heart failure, unspecified: Secondary | ICD-10-CM | POA: Diagnosis not present

## 2017-07-04 DIAGNOSIS — I5032 Chronic diastolic (congestive) heart failure: Secondary | ICD-10-CM | POA: Diagnosis not present

## 2017-07-04 DIAGNOSIS — R0683 Snoring: Secondary | ICD-10-CM | POA: Diagnosis not present

## 2017-07-04 DIAGNOSIS — E782 Mixed hyperlipidemia: Secondary | ICD-10-CM | POA: Diagnosis not present

## 2017-07-04 DIAGNOSIS — I509 Heart failure, unspecified: Secondary | ICD-10-CM | POA: Diagnosis not present

## 2017-07-04 DIAGNOSIS — I10 Essential (primary) hypertension: Secondary | ICD-10-CM | POA: Diagnosis not present

## 2017-07-04 DIAGNOSIS — I480 Paroxysmal atrial fibrillation: Secondary | ICD-10-CM | POA: Diagnosis not present

## 2017-07-10 DIAGNOSIS — F064 Anxiety disorder due to known physiological condition: Secondary | ICD-10-CM | POA: Diagnosis not present

## 2017-07-17 DIAGNOSIS — I480 Paroxysmal atrial fibrillation: Secondary | ICD-10-CM | POA: Diagnosis not present

## 2017-07-17 DIAGNOSIS — E782 Mixed hyperlipidemia: Secondary | ICD-10-CM | POA: Diagnosis not present

## 2017-07-17 DIAGNOSIS — M8580 Other specified disorders of bone density and structure, unspecified site: Secondary | ICD-10-CM | POA: Diagnosis not present

## 2017-07-17 DIAGNOSIS — R0603 Acute respiratory distress: Secondary | ICD-10-CM | POA: Diagnosis not present

## 2017-07-17 DIAGNOSIS — M179 Osteoarthritis of knee, unspecified: Secondary | ICD-10-CM | POA: Diagnosis not present

## 2017-07-17 DIAGNOSIS — I509 Heart failure, unspecified: Secondary | ICD-10-CM | POA: Diagnosis not present

## 2017-07-17 DIAGNOSIS — I10 Essential (primary) hypertension: Secondary | ICD-10-CM | POA: Diagnosis not present

## 2017-07-17 DIAGNOSIS — F325 Major depressive disorder, single episode, in full remission: Secondary | ICD-10-CM | POA: Diagnosis not present

## 2017-07-17 DIAGNOSIS — R600 Localized edema: Secondary | ICD-10-CM | POA: Diagnosis not present

## 2017-07-17 DIAGNOSIS — R0989 Other specified symptoms and signs involving the circulatory and respiratory systems: Secondary | ICD-10-CM | POA: Diagnosis not present

## 2017-07-27 ENCOUNTER — Encounter: Payer: Self-pay | Admitting: Interventional Cardiology

## 2017-07-30 DIAGNOSIS — I480 Paroxysmal atrial fibrillation: Secondary | ICD-10-CM | POA: Diagnosis not present

## 2017-07-30 DIAGNOSIS — G4733 Obstructive sleep apnea (adult) (pediatric): Secondary | ICD-10-CM | POA: Diagnosis not present

## 2017-07-30 DIAGNOSIS — G4734 Idiopathic sleep related nonobstructive alveolar hypoventilation: Secondary | ICD-10-CM | POA: Diagnosis not present

## 2017-08-27 DIAGNOSIS — Z7901 Long term (current) use of anticoagulants: Secondary | ICD-10-CM | POA: Diagnosis not present

## 2017-08-27 DIAGNOSIS — I481 Persistent atrial fibrillation: Secondary | ICD-10-CM | POA: Diagnosis not present

## 2017-08-27 DIAGNOSIS — Z48812 Encounter for surgical aftercare following surgery on the circulatory system: Secondary | ICD-10-CM | POA: Diagnosis not present

## 2017-08-27 DIAGNOSIS — Z8679 Personal history of other diseases of the circulatory system: Secondary | ICD-10-CM | POA: Diagnosis not present

## 2017-08-27 DIAGNOSIS — Z79899 Other long term (current) drug therapy: Secondary | ICD-10-CM | POA: Diagnosis not present

## 2017-09-14 DIAGNOSIS — D225 Melanocytic nevi of trunk: Secondary | ICD-10-CM | POA: Diagnosis not present

## 2017-09-14 DIAGNOSIS — L814 Other melanin hyperpigmentation: Secondary | ICD-10-CM | POA: Diagnosis not present

## 2017-09-14 DIAGNOSIS — Z85828 Personal history of other malignant neoplasm of skin: Secondary | ICD-10-CM | POA: Diagnosis not present

## 2017-09-14 DIAGNOSIS — D692 Other nonthrombocytopenic purpura: Secondary | ICD-10-CM | POA: Diagnosis not present

## 2017-09-14 DIAGNOSIS — D2261 Melanocytic nevi of right upper limb, including shoulder: Secondary | ICD-10-CM | POA: Diagnosis not present

## 2017-09-14 DIAGNOSIS — L570 Actinic keratosis: Secondary | ICD-10-CM | POA: Diagnosis not present

## 2017-09-14 DIAGNOSIS — D1801 Hemangioma of skin and subcutaneous tissue: Secondary | ICD-10-CM | POA: Diagnosis not present

## 2017-09-14 DIAGNOSIS — L821 Other seborrheic keratosis: Secondary | ICD-10-CM | POA: Diagnosis not present

## 2017-09-17 DIAGNOSIS — I481 Persistent atrial fibrillation: Secondary | ICD-10-CM | POA: Diagnosis not present

## 2017-09-21 ENCOUNTER — Encounter: Payer: Self-pay | Admitting: Interventional Cardiology

## 2017-09-24 DIAGNOSIS — M179 Osteoarthritis of knee, unspecified: Secondary | ICD-10-CM | POA: Diagnosis not present

## 2017-09-24 DIAGNOSIS — M8580 Other specified disorders of bone density and structure, unspecified site: Secondary | ICD-10-CM | POA: Diagnosis not present

## 2017-09-24 DIAGNOSIS — F329 Major depressive disorder, single episode, unspecified: Secondary | ICD-10-CM | POA: Diagnosis not present

## 2017-09-24 DIAGNOSIS — Z1239 Encounter for other screening for malignant neoplasm of breast: Secondary | ICD-10-CM | POA: Diagnosis not present

## 2017-09-24 DIAGNOSIS — I480 Paroxysmal atrial fibrillation: Secondary | ICD-10-CM | POA: Diagnosis not present

## 2017-09-24 DIAGNOSIS — Z79899 Other long term (current) drug therapy: Secondary | ICD-10-CM | POA: Diagnosis not present

## 2017-09-24 DIAGNOSIS — G4733 Obstructive sleep apnea (adult) (pediatric): Secondary | ICD-10-CM | POA: Diagnosis not present

## 2017-09-24 DIAGNOSIS — Z0000 Encounter for general adult medical examination without abnormal findings: Secondary | ICD-10-CM | POA: Diagnosis not present

## 2017-09-24 DIAGNOSIS — Z1389 Encounter for screening for other disorder: Secondary | ICD-10-CM | POA: Diagnosis not present

## 2017-09-24 DIAGNOSIS — H6120 Impacted cerumen, unspecified ear: Secondary | ICD-10-CM | POA: Diagnosis not present

## 2017-09-24 DIAGNOSIS — I509 Heart failure, unspecified: Secondary | ICD-10-CM | POA: Diagnosis not present

## 2017-09-24 DIAGNOSIS — E782 Mixed hyperlipidemia: Secondary | ICD-10-CM | POA: Diagnosis not present

## 2017-09-24 DIAGNOSIS — I10 Essential (primary) hypertension: Secondary | ICD-10-CM | POA: Diagnosis not present

## 2017-09-24 DIAGNOSIS — F325 Major depressive disorder, single episode, in full remission: Secondary | ICD-10-CM | POA: Diagnosis not present

## 2017-10-15 DIAGNOSIS — N390 Urinary tract infection, site not specified: Secondary | ICD-10-CM | POA: Diagnosis not present

## 2017-10-23 DIAGNOSIS — H353121 Nonexudative age-related macular degeneration, left eye, early dry stage: Secondary | ICD-10-CM | POA: Diagnosis not present

## 2017-10-23 DIAGNOSIS — Z961 Presence of intraocular lens: Secondary | ICD-10-CM | POA: Diagnosis not present

## 2017-10-23 DIAGNOSIS — H52201 Unspecified astigmatism, right eye: Secondary | ICD-10-CM | POA: Diagnosis not present

## 2017-11-07 DIAGNOSIS — G4733 Obstructive sleep apnea (adult) (pediatric): Secondary | ICD-10-CM | POA: Diagnosis not present

## 2017-11-08 DIAGNOSIS — R112 Nausea with vomiting, unspecified: Secondary | ICD-10-CM | POA: Diagnosis not present

## 2017-11-08 DIAGNOSIS — G4733 Obstructive sleep apnea (adult) (pediatric): Secondary | ICD-10-CM | POA: Diagnosis not present

## 2017-11-08 DIAGNOSIS — E782 Mixed hyperlipidemia: Secondary | ICD-10-CM | POA: Diagnosis not present

## 2017-11-22 NOTE — Progress Notes (Signed)
Cardiology Office Note:    Date:  11/23/2017   ID:  Judy White, DOB [birth year], MRN [id number]  PCP:  Josetta Huddle, MD  Cardiologist:  Sinclair Grooms, MD   Referring MD: Josetta Huddle, MD   Chief Complaint  Patient presents with  . Coronary Artery Disease  . Atrial Fibrillation    History of Present Illness:    Judy White is a 77 y.o. female with a hx of paroxysmal atrial fibrillation, amiodarone therapy, diastolic heart failure, unable to use anticoagulation due to intracranial hemorrhage, essential hypertension,and anxiety disorder.   He feels great.  No episodes of atrial fibrillation.  Evaluation for atrial fibrillation at [facility] identified sleep apnea which is now also being treated.  She has not had syncope.  She denies orthopnea.  No PND.   Past Medical History:  Diagnosis Date  . Agoraphobia   . Anxiety   . Arthritis    "normal for the age; nothing gives me problems" (01/24/2016)  . COPD (chronic obstructive pulmonary disease) ([place])    "mild" (01/24/2016)  . Depressive disorder, not elsewhere classified   . DVT (deep venous thrombosis) ([place]) 10/08/2015   "they said the blood clot went from one of my legs to my lung"  . History of hiatal hernia    "small one" (01/24/2016)  . Hypercholesteremia   . Hypertension    pt denies this hx on 01/24/2016  . Myalgia and myositis, unspecified   . Osteopenia   . Overactive bladder   . Persistent atrial fibrillation ([place])    a. s/p Watchman implant 12/2014  . Pulmonary embolism ([place]) 10/08/2015    Past Surgical History:  Procedure Laterality Date  . APPENDECTOMY  1983  . BACK SURGERY    . BRONCHOSCOPY  01/24/2016  . CARDIAC CATHETERIZATION     >5 yrs  . CATARACT EXTRACTION W/ INTRAOCULAR LENS  IMPLANT, BILATERAL Bilateral   . CESAREAN SECTION  1967  . LEFT ATRIAL APPENDAGE OCCLUSION N/A 12/31/2014   Procedure: LEFT ATRIAL APPENDAGE OCCLUSION;  Surgeon: Thompson Grayer, MD;  Location: [place] CV LAB;  Service:  Cardiovascular;  Laterality: N/A;  . POSTERIOR FUSION LUMBAR SPINE Bilateral 07/2014  . SHOULDER ARTHROSCOPY W/ ROTATOR CUFF REPAIR Left   . SHOULDER SURGERY Right    "tendon broke; couldn't be repaired"  . TEE WITHOUT CARDIOVERSION N/A 12/22/2014   Procedure: TRANSESOPHAGEAL ECHOCARDIOGRAM (TEE);  Surgeon: Pixie Casino, MD;  Location[facility] ENDOSCOPY;  Service: Cardiovascular;  Laterality: N/A;  . TEE WITHOUT CARDIOVERSION N/A 02/16/2015   post Watchman TEE with good seal and no leak around device   . TONSILLECTOMY    . TOTAL ABDOMINAL HYSTERECTOMY    . VIDEO BRONCHOSCOPY Bilateral 01/24/2016   Procedure: VIDEO BRONCHOSCOPY WITH FLUORO;  Surgeon: Collene Gobble, MD;  Location: [place];  Service: Cardiopulmonary;  Laterality: Bilateral;  . WISDOM TOOTH EXTRACTION      Current Medications: Current Meds  Medication Sig  . acetaminophen (TYLENOL) 500 MG tablet Take 500 mg by mouth every 6 (six) hours as needed for moderate pain or headache.  Marland Kitchen aspirin EC 81 MG tablet Take 81 mg by mouth daily.  . Calcium-Vitamin D (CALTRATE 600 PLUS-VIT D PO) Take 1 tablet by mouth daily.   . Cholecalciferol (VITAMIN D3) 2000 units TABS Take 2,000 Units by mouth daily.  . clonazePAM (KLONOPIN) 1 MG tablet Take 1.5 mg to 2 mg by mouth daily.  Marland Kitchen diltiazem (CARDIZEM) 120 MG tablet Take 120 mg by mouth  4 (four) times daily.  Marland Kitchen FLUoxetine (PROZAC) 20 MG capsule Take 20 mg by mouth daily.   . furosemide (LASIX) 20 MG tablet Take 1 tablet (20 mg total) by mouth daily.  Marland Kitchen oxybutynin (DITROPAN-XL) 10 MG 24 hr tablet Take 10 mg by mouth daily.   . Potassium 99 MG TABS Take 99 mg by mouth daily.  . vitamin B-12 (CYANOCOBALAMIN) 1000 MCG tablet Take 1,000 mcg by mouth daily.  . vitamin C (ASCORBIC ACID) 500 MG tablet Take 500 mg by mouth daily.  . vitamin E (VITAMIN E) 400 UNIT capsule Take 400 Units daily by mouth.     Allergies:   Patient has no known allergies.   Social History   Socioeconomic History    . Marital status: Married    Spouse name: Not on file  . Number of children: Not on file  . Years of education: Not on file  . Highest education level: Not on file  Occupational History  . Not on file  Social Needs  . Financial resource strain: Not on file  . Food insecurity:    Worry: Not on file    Inability: Not on file  . Transportation needs:    Medical: Not on file    Non-medical: Not on file  Tobacco Use  . Smoking status: Former Smoker    Packs/day: 0.50    Years: 60.00    Pack years: 30.00    Types: Cigarettes    Last attempt to quit: 10/08/2015    Years since quitting: 2.1  . Smokeless tobacco: Never Used  Substance and Sexual Activity  . Alcohol use: Yes    Alcohol/week: 0.0 standard drinks    Comment: 01/24/2016 "don't drink at all right now cause of my RX; social drinker before now"  . Drug use: No  . Sexual activity: Never  Lifestyle  . Physical activity:    Days per week: Not on file    Minutes per session: Not on file  . Stress: Not on file  Relationships  . Social connections:    Talks on phone: Not on file    Gets together: Not on file    Attends religious service: Not on file    Active member of club or organization: Not on file    Attends meetings of clubs or organizations: Not on file    Relationship status: Not on file  Other Topics Concern  . Not on file  Social History Narrative  . Not on file     Family History: The patient's family history includes Aneurysm in her father; Cancer in her unknown relative; Heart Problems in her unknown relative; Heart disease in her brother; Hypotension in her mother; Neuropathy in her brother. There is no history of Heart attack.  ROS:   Please see the history of present illness.    Unexplained weight gain, abdominal pain, diarrhea, nausea vomiting, back pain, easy bruising, numbness and tingling in her feet which she has been interpreting as PAD.  Headaches.  All other systems reviewed and are  negative.  EKGs/Labs/Other Studies Reviewed:    The following studies were reviewed today: No new data  EKG:  EKG is not  ordered today.  Recent Labs: 01/03/2017: Hemoglobin 14.5; Platelets 221 03/09/2017: BUN 23; Creatinine, Ser 0.86; Magnesium 2.1; Potassium 4.9; Sodium 143  Recent Lipid Panel No results found for: CHOL, TRIG, HDL, CHOLHDL, VLDL, LDLCALC, LDLDIRECT  Physical Exam:    VS:  BP (!) 102/58   Pulse 63  Ht 5\' 6"  (1.676 m)   Wt 190 lb (86.2 kg)   BMI 30.67 kg/m     Wt Readings from Last 3 Encounters:  11/23/17 190 lb (86.2 kg)  05/02/17 191 lb (86.6 kg)  04/18/17 193 lb (87.5 kg)     GEN: Mildly to moderately overweight.  Well developed in no acute distress HEENT: Normal NECK: No JVD. LYMPHATICS: No lymphadenopathy CARDIAC: RRR, no murmur, no gallop, no edema. VASCULAR: Diminished radial bilateral pulses.  No bruits. RESPIRATORY:  Clear to auscultation without rales, wheezing or rhonchi  ABDOMEN: Soft, non-tender, non-distended, No pulsatile mass, MUSCULOSKELETAL: No deformity  SKIN: Warm and dry NEUROLOGIC:  Alert and oriented x 3 PSYCHIATRIC:  Normal affect   ASSESSMENT:    1. Paroxysmal atrial fibrillation (HCC)   2. On amiodarone therapy   3. Chronic diastolic heart failure ([place])   4. Essential hypertension   5. COPD GOLD 0    6. Hypercholesteremia    PLAN:    In order of problems listed above:  1. Since ablation, no episodes of atrial fibrillation have been documented. 2. Not on antiarrhythmic therapy at this time.  Amiodarone has been discontinued. 3. No evidence of volume overload. 4. Blood pressure is low normal due to diltiazem and furosemide.  Continue current therapy. 5. Not addressed 6. Start rosuvastatin 5 mg/day.  Liver and lipid panel in [redacted] weeks along with a lipid clinic follow-up.  Previously unable to tolerate statin therapy due to musculoskeletal complaints.  Her most recent LDL is dangerously high at 152.  We need to  aggressively lower her LDL less than 70 to give protection against developing recurrent events.  Clinical follow-up in 9 to 12 months.  Will refer to the cholesterol clinic such that if we are not able to hit targets she could be converted to PCSK9 therapy.   Medication Adjustments/Labs and Tests Ordered: Current medicines are reviewed at length with the patient today.  Concerns regarding medicines are outlined above.  No orders of the defined types were placed in this encounter.  No orders of the defined types were placed in this encounter.   There are no Patient Instructions on file for this visit.   Signed, Sinclair Grooms, MD  11/23/2017 8:20 AM    [place]

## 2017-11-23 ENCOUNTER — Ambulatory Visit (INDEPENDENT_AMBULATORY_CARE_PROVIDER_SITE_OTHER): Payer: Medicare Other | Admitting: Interventional Cardiology

## 2017-11-23 ENCOUNTER — Encounter: Payer: Self-pay | Admitting: Interventional Cardiology

## 2017-11-23 VITALS — BP 102/58 | HR 63 | Ht 66.0 in | Wt 190.0 lb

## 2017-11-23 DIAGNOSIS — I480 Paroxysmal atrial fibrillation: Secondary | ICD-10-CM | POA: Diagnosis not present

## 2017-11-23 DIAGNOSIS — E7800 Pure hypercholesterolemia, unspecified: Secondary | ICD-10-CM | POA: Diagnosis not present

## 2017-11-23 DIAGNOSIS — I5032 Chronic diastolic (congestive) heart failure: Secondary | ICD-10-CM | POA: Diagnosis not present

## 2017-11-23 DIAGNOSIS — J449 Chronic obstructive pulmonary disease, unspecified: Secondary | ICD-10-CM

## 2017-11-23 DIAGNOSIS — I10 Essential (primary) hypertension: Secondary | ICD-10-CM | POA: Diagnosis not present

## 2017-11-23 DIAGNOSIS — Z79899 Other long term (current) drug therapy: Secondary | ICD-10-CM | POA: Diagnosis not present

## 2017-11-23 MED ORDER — ROSUVASTATIN CALCIUM 5 MG PO TABS: 5.0000 mg | ORAL_TABLET | Freq: Every day | ORAL | 3 refills | Status: DC

## 2017-11-23 NOTE — Patient Instructions (Signed)
Medication Instructions:  1) START Rosuvastatin 5mg  once daily  Labwork: Your physician recommends that you return for lab work one week prior to seeing the [facility] (Lipid, liver).  You will need to be fasting for these labs (nothing to eat or drink after midnight).   Testing/Procedures: None  Follow-Up: Your physician recommends that you schedule a follow-up appointment in: 6 weeks with the Lipid Clinic.  Your physician wants you to follow-up in: 1 year with Dr. Tamala Julian.  You will receive a reminder letter in the mail two months in advance. If you don't receive a letter, please call our office to schedule the follow-up appointment.    Any Other Special Instructions Will Be Listed Below (If Applicable).     If you need a refill on your cardiac medications before your next appointment, please call your pharmacy.

## 2017-12-05 ENCOUNTER — Telehealth: Payer: Self-pay | Admitting: Interventional Cardiology

## 2017-12-05 NOTE — Telephone Encounter (Signed)
Yes, keep appointment with lipid clinic.

## 2017-12-05 NOTE — Telephone Encounter (Signed)
Called patient back about her message. Patient complaining of Crestor causing her to have joint pain and she stopped taking it yesterday. Patient stated her symptoms have improved some. Encouraged patient to try to take Crestor every other day, to see if she can tolerate. Encouraged patient to keep her appointment with lipid clinic. Will forward to Dr. Tamala Julian for further advisement.

## 2017-12-05 NOTE — Telephone Encounter (Signed)
Called patient and informed her that Dr. Tamala Julian agrees to plan. Patient verbalized understanding.

## 2017-12-05 NOTE — Telephone Encounter (Signed)
° °  Pt c/o medication issue:  1. Name of Medication: rosuvastatin (CRESTOR) 5 MG tablet  2. How are you currently taking this medication (dosage and times per day)? Take 1 tablet (5 mg total) by mouth daily. 3. Are you having a reaction (difficulty breathing--STAT)? Joint paint  4. What is your medication issue? Patient states medication is causing joint pain. Patient states she stopped taking on 10/8

## 2017-12-10 DIAGNOSIS — F3341 Major depressive disorder, recurrent, in partial remission: Secondary | ICD-10-CM | POA: Diagnosis not present

## 2017-12-21 DIAGNOSIS — G629 Polyneuropathy, unspecified: Secondary | ICD-10-CM | POA: Diagnosis not present

## 2017-12-28 ENCOUNTER — Other Ambulatory Visit: Payer: Medicare Other

## 2017-12-28 DIAGNOSIS — E7800 Pure hypercholesterolemia, unspecified: Secondary | ICD-10-CM | POA: Diagnosis not present

## 2017-12-28 LAB — HEPATIC FUNCTION PANEL
ALBUMIN: 4.2 g/dL (ref 3.5–4.8)
ALT: 10 IU/L (ref 0–32)
AST: 17 IU/L (ref 0–40)
Alkaline Phosphatase: 95 IU/L (ref 39–117)
Bilirubin Total: 0.3 mg/dL (ref 0.0–1.2)
Bilirubin, Direct: 0.1 mg/dL (ref 0.00–0.40)
TOTAL PROTEIN: 6.1 g/dL (ref 6.0–8.5)

## 2017-12-28 LAB — LIPID PANEL
CHOLESTEROL TOTAL: 185 mg/dL (ref 100–199)
Chol/HDL Ratio: 4.5 ratio — ABNORMAL HIGH (ref 0.0–4.4)
HDL: 41 mg/dL (ref >39)
LDL CALC: 100 mg/dL — AB (ref 0–99)
Triglycerides: 218 mg/dL — ABNORMAL HIGH (ref 0–149)
VLDL Cholesterol Cal: 44 mg/dL — ABNORMAL HIGH (ref 5–40)

## 2018-01-04 ENCOUNTER — Telehealth: Payer: Self-pay | Admitting: Pharmacist

## 2018-01-04 ENCOUNTER — Ambulatory Visit: Payer: Medicare Other

## 2018-01-04 MED ORDER — PRAVASTATIN SODIUM 20 MG PO TABS: 10.0000 mg | ORAL_TABLET | Freq: Every evening | ORAL | 11 refills | Status: DC

## 2018-01-04 NOTE — Telephone Encounter (Signed)
Spoke with pt regarding cholesterol on the phone. She is experiencing leg pain on rosuvastatin 5mg  every other day and is having trouble walking.  Will stop rosuvastatin and try pravastatin 10mg  every evening. Will follow up with pt in 1 month via telephone to discuss tolerability.

## 2018-01-13 DIAGNOSIS — R197 Diarrhea, unspecified: Secondary | ICD-10-CM | POA: Diagnosis not present

## 2018-01-13 DIAGNOSIS — R1084 Generalized abdominal pain: Secondary | ICD-10-CM | POA: Diagnosis not present

## 2018-01-13 DIAGNOSIS — R109 Unspecified abdominal pain: Secondary | ICD-10-CM | POA: Diagnosis not present

## 2018-01-29 ENCOUNTER — Other Ambulatory Visit (HOSPITAL_BASED_OUTPATIENT_CLINIC_OR_DEPARTMENT_OTHER): Payer: Self-pay

## 2018-01-29 DIAGNOSIS — R0902 Hypoxemia: Secondary | ICD-10-CM

## 2018-01-30 DIAGNOSIS — G629 Polyneuropathy, unspecified: Secondary | ICD-10-CM | POA: Diagnosis not present

## 2018-01-30 DIAGNOSIS — E782 Mixed hyperlipidemia: Secondary | ICD-10-CM | POA: Diagnosis not present

## 2018-01-30 DIAGNOSIS — M179 Osteoarthritis of knee, unspecified: Secondary | ICD-10-CM | POA: Diagnosis not present

## 2018-01-30 DIAGNOSIS — R112 Nausea with vomiting, unspecified: Secondary | ICD-10-CM | POA: Diagnosis not present

## 2018-02-11 MED ORDER — PRAVASTATIN SODIUM 20 MG PO TABS: ORAL_TABLET | ORAL | 11 refills | Status: DC

## 2018-02-11 NOTE — Addendum Note (Signed)
Addended by: Magdelene Ruark E on: 02/11/2018 01:16 PM   Modules accepted: Orders

## 2018-02-11 NOTE — Telephone Encounter (Signed)
Called pt to assess pravastatin tolerability. She reports taking 1/4 tablet each day (5mg  daily). She is feeling well overall. PCP checked labs on 12/4: TC 222, HDL 37, LDL 112, TG 366 (not fasting). She is willing to increase dose to 1 tablet (20mg ) every other day. Will call again in 1 month to assess tolerability.

## 2018-02-26 ENCOUNTER — Other Ambulatory Visit: Payer: Self-pay | Admitting: *Deleted

## 2018-03-11 DIAGNOSIS — R0609 Other forms of dyspnea: Secondary | ICD-10-CM | POA: Diagnosis not present

## 2018-03-11 DIAGNOSIS — Z87891 Personal history of nicotine dependence: Secondary | ICD-10-CM | POA: Diagnosis not present

## 2018-03-11 DIAGNOSIS — I484 Atypical atrial flutter: Secondary | ICD-10-CM | POA: Diagnosis not present

## 2018-03-11 DIAGNOSIS — I2782 Chronic pulmonary embolism: Secondary | ICD-10-CM | POA: Diagnosis not present

## 2018-03-11 DIAGNOSIS — Z95818 Presence of other cardiac implants and grafts: Secondary | ICD-10-CM | POA: Diagnosis not present

## 2018-03-11 DIAGNOSIS — I4819 Other persistent atrial fibrillation: Secondary | ICD-10-CM | POA: Diagnosis not present

## 2018-03-14 ENCOUNTER — Ambulatory Visit (HOSPITAL_BASED_OUTPATIENT_CLINIC_OR_DEPARTMENT_OTHER): Payer: Medicare Other | Attending: Internal Medicine | Admitting: Internal Medicine

## 2018-03-14 DIAGNOSIS — R0902 Hypoxemia: Secondary | ICD-10-CM | POA: Diagnosis not present

## 2018-03-14 DIAGNOSIS — G4733 Obstructive sleep apnea (adult) (pediatric): Secondary | ICD-10-CM | POA: Insufficient documentation

## 2018-03-16 NOTE — Procedures (Signed)
   NAME: Judy White DATE OF BIRTH:  10/06/1940 MEDICAL RECORD NUMBER [id number]  LOCATION: [facility]  PHYSICIAN: Marius Ditch  DATE OF STUDY: 03/14/2018  SLEEP STUDY TYPE: Positive Airway Pressure Titration               REFERRING PHYSICIAN: Marius Ditch, MD  INDICATION FOR STUDY: persistent hypoxemia on APAP despite good airway control reported by her machine. She is on APAP 4-16 and her 95%ile pressure is 9.1  EPWORTH SLEEPINESS SCORE:  16 HEIGHT: 5\' 6"  (167.6 cm)  WEIGHT: 185 lb (83.9 kg)    Body mass index is 29.86 kg/m.  NECK SIZE: 13.5 in.  MEDICATIONS  Patient self administered medications include: N/A. Medications administered during study include No sleep medicine administered.Marland Kitchen   SLEEP STUDY TECHNIQUE  The patient underwent an attended overnight polysomnography titration to assess the effects of cpap therapy. The following variables were monitored: EEG(C4-A1, C3-A2, O1-A2, O2-A1), EOG, submental and leg EMG, ECG, oxyhemoglobin saturation by pulse oximetry, thoracic and abdominal respiratory effort belts, nasal/oral airflow by pressure sensor, body position sensor and snoring sensor. CPAP pressure was titrated to eliminate apneas, hypopneas and oxygen desaturation.   TECHNICAL COMMENTS  Comments added by Technician: Patient tolerated CPAP well Comments added by Scorer: N/A   SLEEP ARCHITECTURE  The study was initiated at 9:59:39 PM and terminated at 4:43:58 AM. Total recorded time was 404.3 minutes. EEG confirmed total sleep time was 255 minutes yielding a sleep efficiency of 63.1%%. Sleep onset after lights out was 33.4 minutes with a REM latency of 307.5 minutes. The patient spent 9.2%% of the night in stage N1 sleep, 80.4%% in stage N2 sleep, 0.0%% in stage N3 and 10.4% in REM. The Arousal Index was 3.5/hour.   RESPIRATORY PARAMETERS  The overall AHI was 4.5 per hour, and the RDI was 4.7 events/hour with a central apnea index of 0.5/per hour.  The most appropriate setting of CPAP was IPAP/EPAP 10/10 cm H2O. At this setting, the sleep efficiency was 17% and the patient was supine for 100%. The AHI was 0.0 events per hour, and the RDI was 0.0 events/hour (with 0.5 central events) and the arousal index was 0.0 per hour.The oxygen nadir was 92.0% during sleep with CPAP 10.  LEG MOVEMENT DATA  The total leg movements were 360 with a resulting leg movement index of 84.7/hr. Associated arousal with leg movement index was 0.5/hr.   CARDIAC DATA  The underlying cardiac rhythm was most consistent with sinus rhythm. Mean heart rate during sleep was 63.2 bpm. Additional rhythm abnormalities include PVCs.   IMPRESSIONS  Adequate CPAP titration Best pressure, which also showed resolution in hypoxemia was 10 cm water pressure  DIAGNOSIS  Obstructive Sleep Apnea (327.23 [G47.33 ICD-10])  RECOMMENDATIONS  Switch patient from APAP 4-16 to CPAP 10 with ramp from 4 over 10-20 minutes pre patient preference.   Marius Ditch Sleep specialist, [place] Board of [employer] Medicine  ELECTRONICALLY SIGNED ON:  03/16/2018, 8:28 PM [place] PH: [phone]   FX: [phone] [place]

## 2018-03-25 DIAGNOSIS — I4819 Other persistent atrial fibrillation: Secondary | ICD-10-CM | POA: Diagnosis not present

## 2018-03-27 DIAGNOSIS — Z79899 Other long term (current) drug therapy: Secondary | ICD-10-CM | POA: Diagnosis not present

## 2018-03-27 DIAGNOSIS — G4733 Obstructive sleep apnea (adult) (pediatric): Secondary | ICD-10-CM | POA: Diagnosis not present

## 2018-03-27 DIAGNOSIS — M179 Osteoarthritis of knee, unspecified: Secondary | ICD-10-CM | POA: Diagnosis not present

## 2018-03-27 DIAGNOSIS — G720 Drug-induced myopathy: Secondary | ICD-10-CM | POA: Diagnosis not present

## 2018-03-27 DIAGNOSIS — G629 Polyneuropathy, unspecified: Secondary | ICD-10-CM | POA: Diagnosis not present

## 2018-03-27 DIAGNOSIS — E559 Vitamin D deficiency, unspecified: Secondary | ICD-10-CM | POA: Diagnosis not present

## 2018-03-27 DIAGNOSIS — E782 Mixed hyperlipidemia: Secondary | ICD-10-CM | POA: Diagnosis not present

## 2018-03-27 DIAGNOSIS — I480 Paroxysmal atrial fibrillation: Secondary | ICD-10-CM | POA: Diagnosis not present

## 2018-04-09 DIAGNOSIS — F3341 Major depressive disorder, recurrent, in partial remission: Secondary | ICD-10-CM | POA: Diagnosis not present

## 2018-04-15 ENCOUNTER — Other Ambulatory Visit: Payer: Self-pay

## 2018-04-15 ENCOUNTER — Emergency Department (HOSPITAL_BASED_OUTPATIENT_CLINIC_OR_DEPARTMENT_OTHER): Payer: Medicare Other

## 2018-04-15 ENCOUNTER — Emergency Department (HOSPITAL_BASED_OUTPATIENT_CLINIC_OR_DEPARTMENT_OTHER)
Admission: EM | Admit: 2018-04-15 | Discharge: 2018-04-16 | Disposition: A | Discharge status: Home / Self Care | Payer: Medicare Other | Attending: Emergency Medicine | Admitting: Emergency Medicine

## 2018-04-15 ENCOUNTER — Encounter (HOSPITAL_BASED_OUTPATIENT_CLINIC_OR_DEPARTMENT_OTHER): Payer: Self-pay

## 2018-04-15 DIAGNOSIS — W01190A Fall on same level from slipping, tripping and stumbling with subsequent striking against furniture, initial encounter: Secondary | ICD-10-CM | POA: Diagnosis not present

## 2018-04-15 DIAGNOSIS — R0781 Pleurodynia: Secondary | ICD-10-CM | POA: Diagnosis not present

## 2018-04-15 DIAGNOSIS — Y93E8 Activity, other personal hygiene: Secondary | ICD-10-CM | POA: Insufficient documentation

## 2018-04-15 DIAGNOSIS — Z87891 Personal history of nicotine dependence: Secondary | ICD-10-CM | POA: Insufficient documentation

## 2018-04-15 DIAGNOSIS — Z79899 Other long term (current) drug therapy: Secondary | ICD-10-CM | POA: Insufficient documentation

## 2018-04-15 DIAGNOSIS — S51011A Laceration without foreign body of right elbow, initial encounter: Secondary | ICD-10-CM | POA: Diagnosis not present

## 2018-04-15 DIAGNOSIS — Y999 Unspecified external cause status: Secondary | ICD-10-CM | POA: Insufficient documentation

## 2018-04-15 DIAGNOSIS — I10 Essential (primary) hypertension: Secondary | ICD-10-CM | POA: Diagnosis not present

## 2018-04-15 DIAGNOSIS — S2231XA Fracture of one rib, right side, initial encounter for closed fracture: Secondary | ICD-10-CM | POA: Diagnosis not present

## 2018-04-15 DIAGNOSIS — S41111A Laceration without foreign body of right upper arm, initial encounter: Secondary | ICD-10-CM | POA: Insufficient documentation

## 2018-04-15 DIAGNOSIS — Y929 Unspecified place or not applicable: Secondary | ICD-10-CM | POA: Diagnosis not present

## 2018-04-15 DIAGNOSIS — Z7982 Long term (current) use of aspirin: Secondary | ICD-10-CM | POA: Diagnosis not present

## 2018-04-15 DIAGNOSIS — Y92009 Unspecified place in unspecified non-institutional (private) residence as the place of occurrence of the external cause: Secondary | ICD-10-CM

## 2018-04-15 DIAGNOSIS — W19XXXA Unspecified fall, initial encounter: Secondary | ICD-10-CM

## 2018-04-15 DIAGNOSIS — J449 Chronic obstructive pulmonary disease, unspecified: Secondary | ICD-10-CM | POA: Insufficient documentation

## 2018-04-15 DIAGNOSIS — S299XXA Unspecified injury of thorax, initial encounter: Secondary | ICD-10-CM | POA: Diagnosis not present

## 2018-04-15 DIAGNOSIS — S51811A Laceration without foreign body of right forearm, initial encounter: Secondary | ICD-10-CM

## 2018-04-15 NOTE — ED Notes (Signed)
Pt was updated on wait and notified md about wanting to leave, EDP states she will be seen next.

## 2018-04-15 NOTE — ED Provider Notes (Signed)
[facility] Provider Note: Georgena Spurling, MD, FACEP  CSN: [id number] MRN: [id number] ARRIVAL: 04/15/18 at 2052 ROOM: [place] PRESENT ILLNESS  04/15/18 11:53 PM Judy White is a 78 y.o. female who fell about 1 PM today while putting socks on.  She fell against her dresser but states it was not a direct blow.  She is having subsequent pain to her right lower rib area.  She rates her pain as "pretty good", worse with palpation or movement.  She also has skin tears to her right upper extremity.   Past Medical History:  Diagnosis Date  . Agoraphobia   . Anxiety   . Arthritis    "normal for the age; nothing gives me problems" (01/24/2016)  . COPD (chronic obstructive pulmonary disease) ([place])    "mild" (01/24/2016)  . Depressive disorder, not elsewhere classified   . DVT (deep venous thrombosis) ([place]) 10/08/2015   "they said the blood clot went from one of my legs to my lung"  . History of hiatal hernia    "small one" (01/24/2016)  . Hypercholesteremia   . Hypertension    pt denies this hx on 01/24/2016  . Myalgia and myositis, unspecified   . Osteopenia   . Overactive bladder   . Persistent atrial fibrillation    a. s/p Watchman implant 12/2014  . Pulmonary embolism ([place]) 10/08/2015    Past Surgical History:  Procedure Laterality Date  . APPENDECTOMY  1983  . BACK SURGERY    . BRONCHOSCOPY  01/24/2016  . CARDIAC CATHETERIZATION     >5 yrs  . CATARACT EXTRACTION W/ INTRAOCULAR LENS  IMPLANT, BILATERAL Bilateral   . CESAREAN SECTION  1967  . LEFT ATRIAL APPENDAGE OCCLUSION N/A 12/31/2014   Procedure: LEFT ATRIAL APPENDAGE OCCLUSION;  Surgeon: Thompson Grayer, MD;  Location: [place] CV LAB;  Service: Cardiovascular;  Laterality: N/A;  . POSTERIOR FUSION LUMBAR SPINE Bilateral 07/2014  . SHOULDER ARTHROSCOPY W/ ROTATOR CUFF REPAIR Left   . SHOULDER SURGERY Right    "tendon broke; couldn't be repaired"  . TEE WITHOUT  CARDIOVERSION N/A 12/22/2014   Procedure: TRANSESOPHAGEAL ECHOCARDIOGRAM (TEE);  Surgeon: Pixie Casino, MD;  Location: [facility] ENDOSCOPY;  Service: Cardiovascular;  Laterality: N/A;  . TEE WITHOUT CARDIOVERSION N/A 02/16/2015   post Watchman TEE with good seal and no leak around device   . TONSILLECTOMY    . TOTAL ABDOMINAL HYSTERECTOMY    . VIDEO BRONCHOSCOPY Bilateral 01/24/2016   Procedure: VIDEO BRONCHOSCOPY WITH FLUORO;  Surgeon: Collene Gobble, MD;  Location: [place];  Service: Cardiopulmonary;  Laterality: Bilateral;  . WISDOM TOOTH EXTRACTION      Family History  Problem Relation Age of Onset  . Aneurysm Father   . Hypotension Mother   . Neuropathy Brother   . Heart disease Brother   . Cancer Other        maternal side  . Heart Problems Other        paternal side  . Heart attack Neg Hx     Social History   Tobacco Use  . Smoking status: Former Smoker    Packs/day: 0.50    Years: 60.00    Pack years: 30.00    Types: Cigarettes    Last attempt to quit: 10/08/2015    Years since quitting: 2.5  . Smokeless tobacco: Never Used  Substance Use Topics  . Alcohol use: Yes    Alcohol/week: 0.0 standard drinks  Comment: occ  . Drug use: No    Prior to Admission medications   Medication Sig Start Date End Date Taking? Authorizing Provider  acetaminophen (TYLENOL) 500 MG tablet Take 500 mg by mouth every 6 (six) hours as needed for moderate pain or headache.    [provider]  aspirin EC 81 MG tablet Take 81 mg by mouth daily.    [provider]  Calcium-Vitamin D (CALTRATE 600 PLUS-VIT D PO) Take 1 tablet by mouth daily.     [provider]  Cholecalciferol (VITAMIN D3) 2000 units TABS Take 2,000 Units by mouth daily.    [provider]  clonazePAM (KLONOPIN) 1 MG tablet Take 1.5 mg to 2 mg by mouth daily.    [provider]  diltiazem (CARDIZEM) 120 MG tablet Take 120 mg by mouth 4 (four) times daily.    [provider]  FLUoxetine (PROZAC) 20 MG capsule Take 20 mg by mouth daily.  04/11/16   [provider]  furosemide (LASIX) 20 MG tablet Take 1 tablet (20 mg total) by mouth daily. 06/19/17   Belva Crome, MD  oxybutynin (DITROPAN-XL) 10 MG 24 hr tablet Take 10 mg by mouth daily.  03/22/13   [provider]  Potassium 99 MG TABS Take 99 mg by mouth daily.    [provider]  vitamin B-12 (CYANOCOBALAMIN) 1000 MCG tablet Take 1,000 mcg by mouth daily.    [provider]  vitamin C (ASCORBIC ACID) 500 MG tablet Take 500 mg by mouth daily.    [provider]  vitamin E (VITAMIN E) 400 UNIT capsule Take 400 Units daily by mouth.    [provider]    Allergies Rosuvastatin and Statins   REVIEW OF SYSTEMS  Negative except as noted here or in the History of Present Illness.   PHYSICAL EXAMINATION  Initial Vital Signs Blood pressure (!) 115/56, pulse (!) 58, temperature 98.2 F (36.8 C), temperature source Oral, resp. rate 18, SpO2 97 %.  Examination General: Well-developed, well-nourished female in no acute distress; appearance consistent with age of record HENT: normocephalic; atraumatic Eyes: Normal appearance Neck: supple Heart: regular rate and rhythm Lungs: clear to auscultation bilaterally Chest: Right lower lateral chest wall tenderness Abdomen: soft; nondistended; nontender; bowel sounds present Extremities: No deformity; full range of motion Neurologic: Awake, alert and oriented; motor function intact in all extremities and symmetric; no facial droop Skin: Warm and dry; skin tears to right shoulder, right elbow and right forearm Psychiatric: Normal mood and affect   RESULTS  Summary of this visit's results, reviewed by myself:   EKG Interpretation  Date/Time:    Ventricular Rate:    PR Interval:    QRS Duration:   QT Interval:    QTC Calculation:   R Axis:     Text Interpretation:        Laboratory  Studies: No results found for this or any previous visit (from the past 24 hour(s)). Imaging Studies: Dg Ribs Unilateral W/chest Right  Result Date: 04/15/2018 CLINICAL DATA:  [place].  Right lateral rib pain. EXAM: RIGHT RIBS AND CHEST - 3+ VIEW COMPARISON:  CT scan 04/12/2017 FINDINGS: The cardiac silhouette, mediastinal and hilar contours are within normal limits and stable. Stable apical densities, likely scarring change. No pleural effusion or pneumothorax. Suspect a nondisplaced anterior eighth rib fracture. IMPRESSION: Suspect nondisplaced right eighth rib fracture. No acute pulmonary findings. Electronically Signed   By: Marijo Sanes M.D.   On:  04/15/2018 21:34    ED COURSE and MDM  Nursing notes and initial vitals signs, including pulse oximetry, reviewed.  Vitals:   04/15/18 2057 04/15/18 2233  BP: 131/73 (!) 115/56  Pulse: 62 (!) 58  Resp: 18   Temp: 98.2 F (36.8 C)   TempSrc: Oral   SpO2: 98% 97%   Radiograph consistent with rib fracture.  Will provide incentive spirometer and analgesics.  PROCEDURES    ED DIAGNOSES     ICD-10-CM   1. Fall in home, initial encounter W19.XXXA    Y92.009   2. Closed fracture of one rib of right side, initial encounter S22.31XA   3. Skin tear of elbow without complication, right, initial encounter S51.011A   4. Skin tear of forearm without complication, right, initial encounter S51.811A   5. Skin tear of right upper arm without complication, initial encounter S41.111A        Maddilynn Esperanza, Jenny Reichmann, MD 04/16/18 [phone]

## 2018-04-15 NOTE — ED Triage Notes (Signed)
Pt fell while putting socks on ~1p-pain to right rib area-skin tears to right FA-NAD-to triage in w/c

## 2018-04-16 DIAGNOSIS — S51011A Laceration without foreign body of right elbow, initial encounter: Secondary | ICD-10-CM | POA: Diagnosis not present

## 2018-04-16 MED ORDER — HYDROCODONE-ACETAMINOPHEN 5-325 MG PO TABS: 1.0000 | ORAL_TABLET | Freq: Four times a day (QID) | ORAL | 0 refills | Status: DC | PRN

## 2018-04-16 MED ORDER — HYDROCODONE-ACETAMINOPHEN 5-325 MG PO TABS
1.0000 | ORAL_TABLET | Freq: Once | ORAL | Status: AC
  Administered 2018-04-16: 1 via ORAL
  Filled 2018-04-16: qty 1

## 2018-04-16 NOTE — ED Notes (Signed)
PT states understanding of care given, follow up care, and medication prescribed. PT ambulated from ED to car with a steady gait. 

## 2018-04-22 ENCOUNTER — Encounter: Payer: Self-pay | Admitting: Emergency Medicine

## 2018-04-22 ENCOUNTER — Ambulatory Visit (INDEPENDENT_AMBULATORY_CARE_PROVIDER_SITE_OTHER): Payer: Medicare Other | Admitting: Emergency Medicine

## 2018-04-22 DIAGNOSIS — R9389 Abnormal findings on diagnostic imaging of other specified body structures: Secondary | ICD-10-CM | POA: Diagnosis not present

## 2018-04-22 DIAGNOSIS — S2239XA Fracture of one rib, unspecified side, initial encounter for closed fracture: Secondary | ICD-10-CM | POA: Insufficient documentation

## 2018-04-22 DIAGNOSIS — I2699 Other pulmonary embolism without acute cor pulmonale: Secondary | ICD-10-CM | POA: Diagnosis not present

## 2018-04-22 DIAGNOSIS — R0789 Other chest pain: Secondary | ICD-10-CM | POA: Diagnosis not present

## 2018-04-22 DIAGNOSIS — J449 Chronic obstructive pulmonary disease, unspecified: Secondary | ICD-10-CM | POA: Diagnosis not present

## 2018-04-22 DIAGNOSIS — S2231XS Fracture of one rib, right side, sequela: Secondary | ICD-10-CM

## 2018-04-22 NOTE — Patient Instructions (Addendum)
Keep working with your incentive spirometer and doing deep breathing exercises. It is okay to use low-dose ibuprofen a few times a day for pain control from your rib fracture. We will not repeat your CT scan of the chest at this time.  Follow with Dr. Lamonte Sakai in 12 months or sooner if you have any problems.

## 2018-04-22 NOTE — Assessment & Plan Note (Signed)
Right eighth rib fracture with some restriction on her inspiratory capacity due to pain.  Talk to her about incentive spirometry and the expected course.  Reviewed the possible pain control options, recommended ibuprofen in moderation

## 2018-04-22 NOTE — Progress Notes (Signed)
   History of Present Illness Judy White is a 78 y.o. female current smoker with recent PE started on Pradaxa 10/07/15, COPD, HTN, paroxysmal-A.Fib, patient previously not on blood thinners despite CHADS vasc of 4 due to history of brain bleed. She has a Watchman device in her left atrial appendage.    HPI:   ROV 04/22/2018 --Judy White is 78 with a history of tobacco use and pulmonary embolism, atrial fibrillation for which she is previously been on amiodarone and ablated last year, remote intracranial hemorrhage.  We followed her for some mild obstructive lung disease and an abnormal CT scan of the chest that included some scattered groundglass infiltrate.  As mentioned no longer on amiodarone, is now off anticoagulation.  A bronchoscopy from 2017 was culture negative, overall reassuring.  We have followed the findings with serial CT scans and her abnormalities were smaller 04/12/2017.  We have done a trial of bronchodilators in the past but she never seem to get much benefit. She unfortunately fell last Monday, fractured her R 8th rib. She has a lot of pain w respiration since the fall, has had some wheeze as well. She is doing IS.    Review Of Systems: As per HPI   Vital Signs BP 100/62 (BP Location: Left Arm, Cuff Size: Normal)   Pulse (!) 59   Ht 5\' 6"  (1.676 m)   Wt 221 lb 6.4 oz (100.4 kg)   SpO2 95%   BMI 35.73 kg/m   Vitals:   04/22/18 1038  BP: 100/62  Pulse: (!) 59  SpO2: 95%  Weight: 221 lb 6.4 oz (100.4 kg)  Height: 5\' 6"  (1.676 m)   Physical Exam: Gen: Pleasant, well-nourished, in no distress,  normal affect  ENT: No lesions,  mouth clear,  oropharynx clear, no postnasal drip  Neck: No JVD, no stridor  Lungs: No use of accessory muscles, no crackles or wheezing on normal respiration, no wheeze on forced expiration  Cardiovascular: RRR, heart sounds normal, no murmur or gallops, no peripheral edema  Musculoskeletal: No deformities, no cyanosis or clubbing  Neuro:  alert, awake, non focal  Skin: Warm, no lesions or rashes   Assessment/Plan  Pulmonary embolism (HCC) She completed anticoagulation therapy.  Her anticoagulation was subsequently stopped by cardiology is managing her atrial fibrillation.  COPD GOLD 0  Has not required her benefited from bronchodilators.  Continue to follow  Abnormal CT of the chest Some improved groundglass inflammatory changes, bronchoscopy negative.  Smaller on her scan last year.  I do not think there is any indication to repeat at this time  Rib fracture Right eighth rib fracture with some restriction on her inspiratory capacity due to pain.  Talk to her about incentive spirometry and the expected course.  Reviewed the possible pain control options, recommended ibuprofen in moderation    Baltazar Apo, MD, PhD 04/22/2018, 11:05 AM [facility] [phone] or if no answer [phone]

## 2018-04-22 NOTE — Assessment & Plan Note (Signed)
She completed anticoagulation therapy.  Her anticoagulation was subsequently stopped by cardiology is managing her atrial fibrillation.

## 2018-04-22 NOTE — Assessment & Plan Note (Signed)
Has not required her benefited from bronchodilators.  Continue to follow

## 2018-04-22 NOTE — Assessment & Plan Note (Signed)
Some improved groundglass inflammatory changes, bronchoscopy negative.  Smaller on her scan last year.  I do not think there is any indication to repeat at this time

## 2018-05-29 HISTORY — PX: TOOTH EXTRACTION: SUR596

## 2018-06-07 ENCOUNTER — Other Ambulatory Visit: Payer: Self-pay | Admitting: Interventional Cardiology

## 2018-08-05 IMAGING — CT CT CHEST HIGH RESOLUTION W/O CM
2 of 5 series · 15 of 36 positions shown, 18 images · non-contrast
Comparison: Chest CT 10/08/2015.

CLINICAL DATA: 75-year-old female with history of shortness of
breath and hypoxemia. Evaluate for potential interstitial lung
disease. History of Amiodarone use. Former smoker.

EXAM:
CT CHEST WITHOUT CONTRAST
TECHNIQUE: Multidetector CT imaging of the chest was performed following the
standard protocol without intravenous contrast. High resolution
imaging of the lungs, as well as inspiratory and expiratory imaging,
was performed.

[Series 3: high resolution · axial · 0.59mm/px · z∈[-322,-42]mm · 12 of 154 slices shown, 15 images]
[im 7/154  mediastinal]
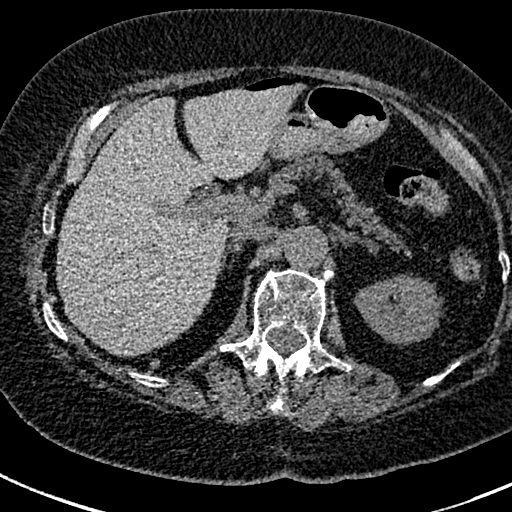
[im 7/154  lung]
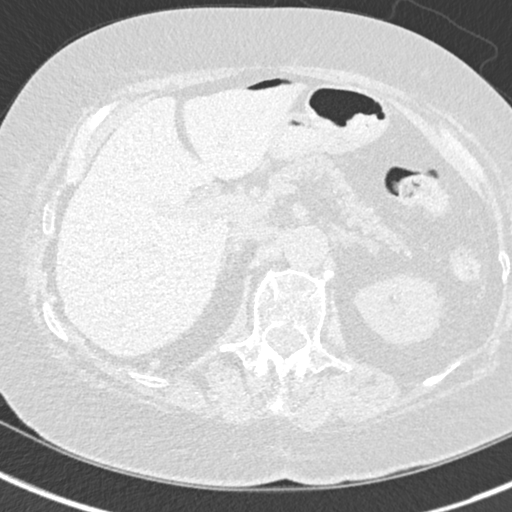
[im 21/154  lung]
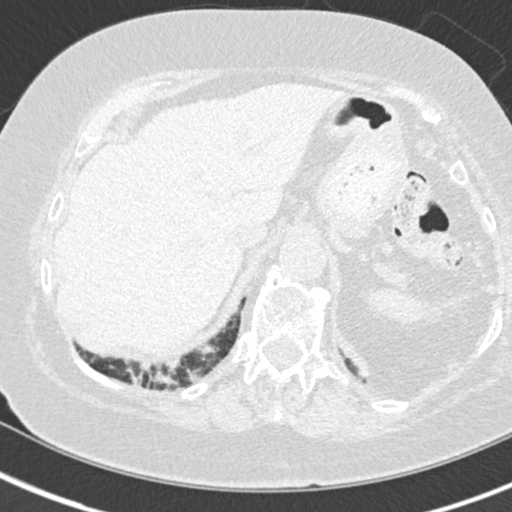
[im 35/154  lung]
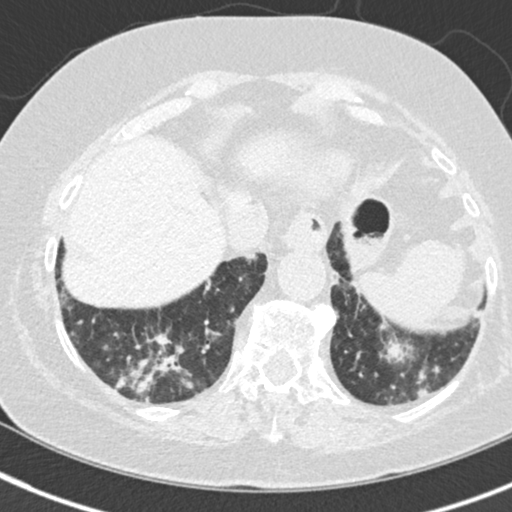
[im 49/154  lung]
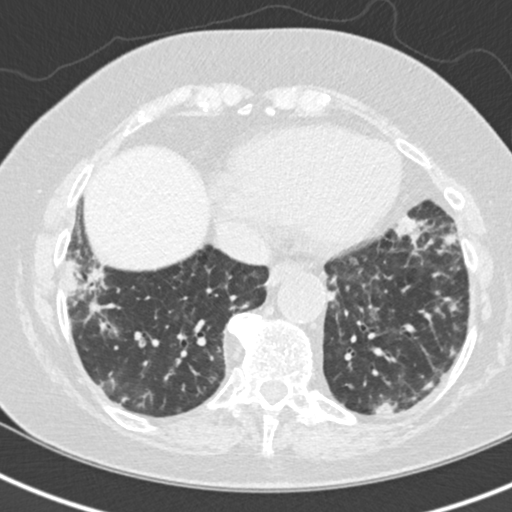
[im 56/154  mediastinal]
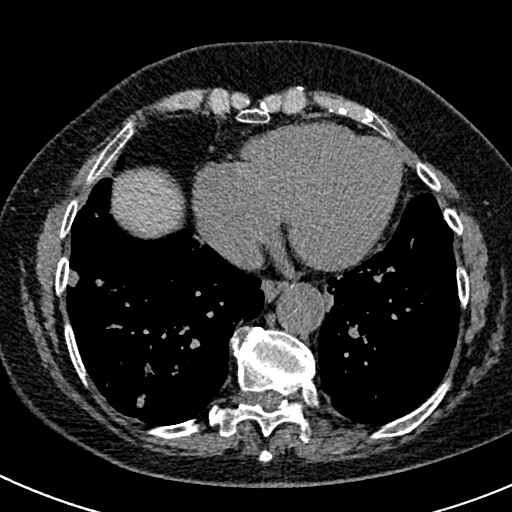
[im 56/154  lung]
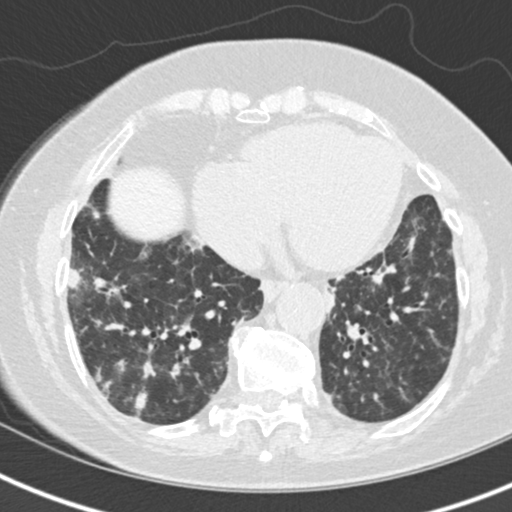
[im 70/154  lung]
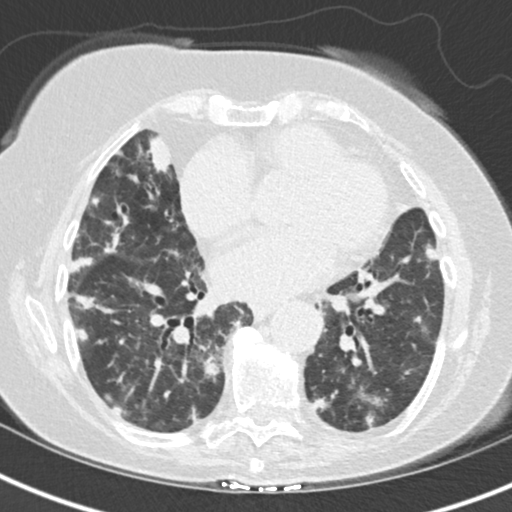
[im 84/154  lung]
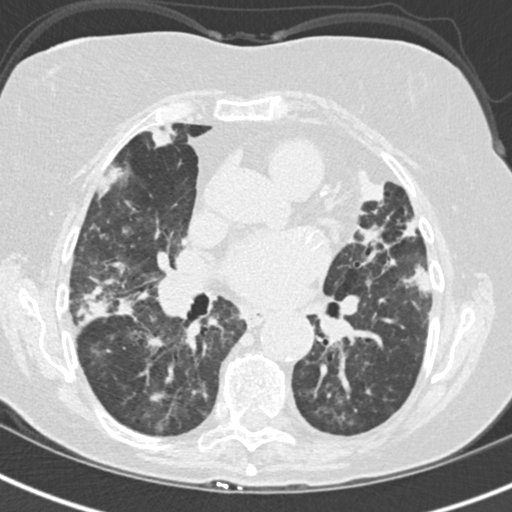
[im 98/154  lung]
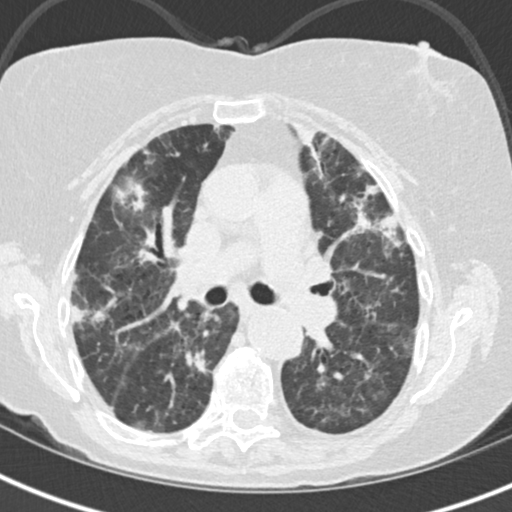
[im 105/154  mediastinal]
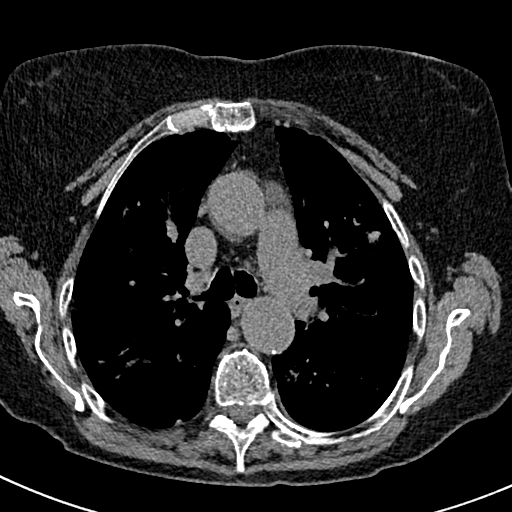
[im 105/154  lung]
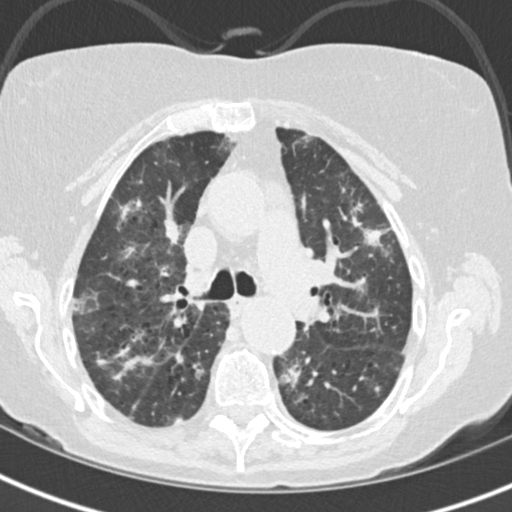
[im 119/154  lung]
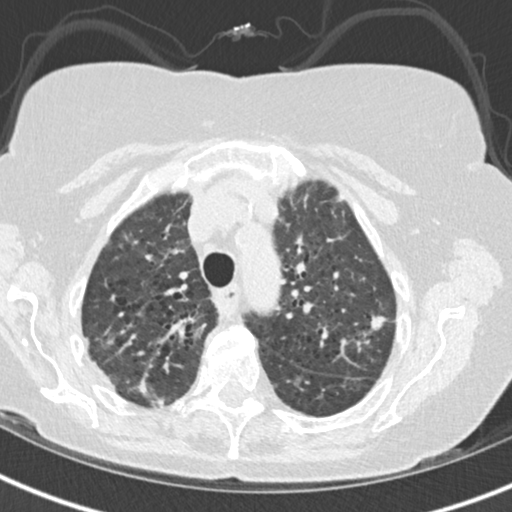
[im 133/154  lung]
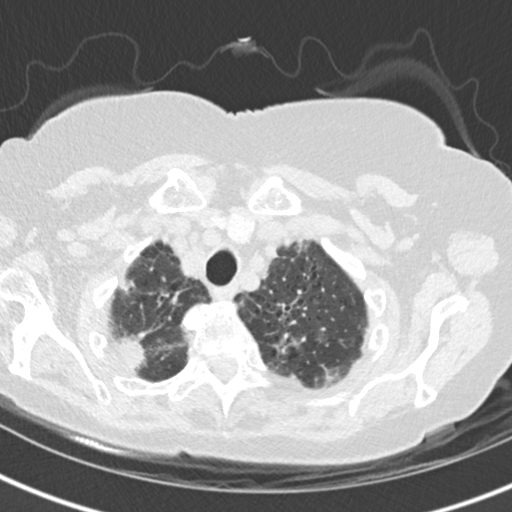
[im 147/154  lung]
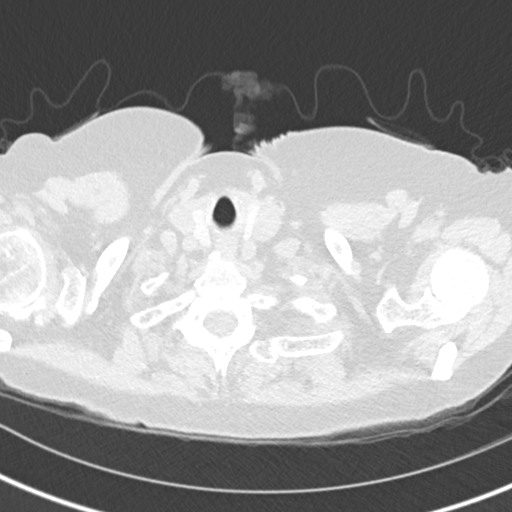

[Series 8: coronal · coronal · 0.59mm/px · 3 of 120 slices shown]
[im 24/120  lung]
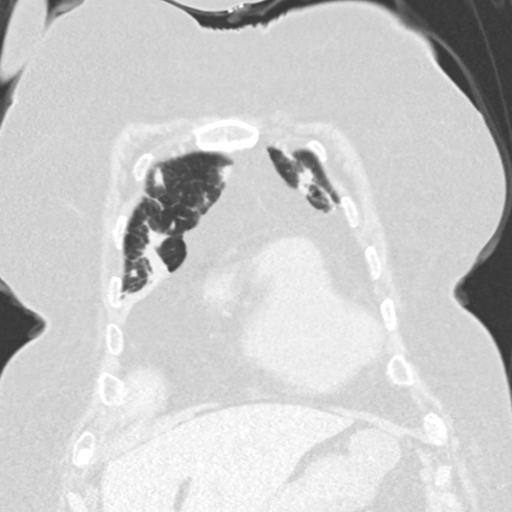
[im 48/120  lung]
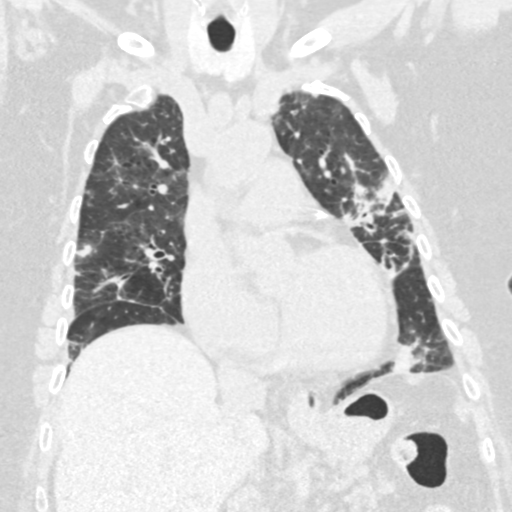
[im 72/120  lung]
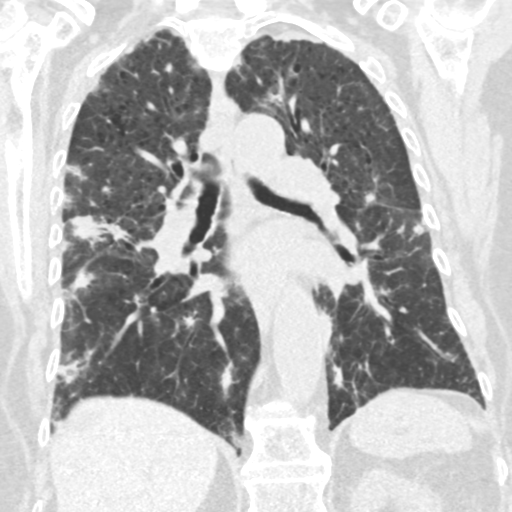

[15 of 36 positions shown; findings below may reference images not displayed]

FINDINGS: Cardiovascular: Heart size is mildly enlarged. There is no
significant pericardial fluid, thickening or pericardial
calcification. There is aortic atherosclerosis, as well as
atherosclerosis of the great vessels of the mediastinum and the
coronary arteries, including calcified atherosclerotic plaque in the
left anterior descending coronary arteries. Left atrial appendage
closure device again incidentally noted.

Mediastinum/Nodes: Probable right hilar lymphadenopathy measuring up
to 1.3 cm in short axis (image 61 of series 3) difficult to
accurately assess without IV contrast given the proximity to
adjacent vasculature. No other definite mediastinal or left hilar
lymphadenopathy is confidently identified on today's noncontrast CT
examination. Small hiatal hernia. No axillary lymphadenopathy.

Lungs/Pleura: Compared to the prior examination there is a dramatic
increase in the number and size of numerous pulmonary nodules and
masses, the largest of which is in the lingula measuring up to 3.9 x
2.2 cm. None of these demonstrate internal high attenuation or
definite calcification. Several of these are very ill-defined and
surrounded by adjacent septal thickening and architectural
distortion, well demonstrated on the high-resolution images. No
areas of honeycombing. No definite craniocaudal gradient.
Inspiratory and expiratory imaging demonstrates moderate air
trapping, indicative of small airways disease. No acute
consolidative airspace disease and no pleural effusions.

Upper Abdomen: Aortic atherosclerosis.

Musculoskeletal: There are no aggressive appearing lytic or blastic
lesions noted in the visualized portions of the skeleton.
IMPRESSION: 1. Marked increased number and size of innumerable pulmonary nodules
and masses compared to the prior examination. The most concerning
possibility is that this represents widespread metastatic disease to
the lungs. It is possible that these nodules could be related to
Amiodarone induced pulmonary toxicity, however, that is clearly a
diagnosis of exclusion, and correlation with biopsy of one or more
of these nodules is recommended in the near future to establish a
tissue diagnosis.
2. Probable right hilar lymphadenopathy.
3. Moderate air trapping, indicative of small airways disease.
4. Aortic atherosclerosis, in addition to left anterior descending
coronary artery disease. Assessment for potential risk factor
modification, dietary therapy or pharmacologic therapy may be
warranted, if clinically indicated.
5. Mild cardiomegaly.
6. Additional incidental findings, as above.

## 2018-08-15 DIAGNOSIS — M179 Osteoarthritis of knee, unspecified: Secondary | ICD-10-CM | POA: Diagnosis not present

## 2018-09-09 DIAGNOSIS — F3341 Major depressive disorder, recurrent, in partial remission: Secondary | ICD-10-CM | POA: Diagnosis not present

## 2018-09-17 DIAGNOSIS — L821 Other seborrheic keratosis: Secondary | ICD-10-CM | POA: Diagnosis not present

## 2018-09-17 DIAGNOSIS — L814 Other melanin hyperpigmentation: Secondary | ICD-10-CM | POA: Diagnosis not present

## 2018-09-17 DIAGNOSIS — D225 Melanocytic nevi of trunk: Secondary | ICD-10-CM | POA: Diagnosis not present

## 2018-09-17 DIAGNOSIS — L723 Sebaceous cyst: Secondary | ICD-10-CM | POA: Diagnosis not present

## 2018-09-17 DIAGNOSIS — D692 Other nonthrombocytopenic purpura: Secondary | ICD-10-CM | POA: Diagnosis not present

## 2018-09-17 DIAGNOSIS — L820 Inflamed seborrheic keratosis: Secondary | ICD-10-CM | POA: Diagnosis not present

## 2018-09-17 DIAGNOSIS — Z85828 Personal history of other malignant neoplasm of skin: Secondary | ICD-10-CM | POA: Diagnosis not present

## 2018-09-17 DIAGNOSIS — D1801 Hemangioma of skin and subcutaneous tissue: Secondary | ICD-10-CM | POA: Diagnosis not present

## 2018-10-04 DIAGNOSIS — Z1389 Encounter for screening for other disorder: Secondary | ICD-10-CM | POA: Diagnosis not present

## 2018-10-04 DIAGNOSIS — G720 Drug-induced myopathy: Secondary | ICD-10-CM | POA: Diagnosis not present

## 2018-10-04 DIAGNOSIS — G629 Polyneuropathy, unspecified: Secondary | ICD-10-CM | POA: Diagnosis not present

## 2018-10-04 DIAGNOSIS — Z0000 Encounter for general adult medical examination without abnormal findings: Secondary | ICD-10-CM | POA: Diagnosis not present

## 2018-10-04 DIAGNOSIS — E782 Mixed hyperlipidemia: Secondary | ICD-10-CM | POA: Diagnosis not present

## 2018-10-04 DIAGNOSIS — E559 Vitamin D deficiency, unspecified: Secondary | ICD-10-CM | POA: Diagnosis not present

## 2018-10-04 DIAGNOSIS — G4733 Obstructive sleep apnea (adult) (pediatric): Secondary | ICD-10-CM | POA: Diagnosis not present

## 2018-10-04 DIAGNOSIS — I480 Paroxysmal atrial fibrillation: Secondary | ICD-10-CM | POA: Diagnosis not present

## 2018-10-04 DIAGNOSIS — E538 Deficiency of other specified B group vitamins: Secondary | ICD-10-CM | POA: Diagnosis not present

## 2018-10-04 DIAGNOSIS — M179 Osteoarthritis of knee, unspecified: Secondary | ICD-10-CM | POA: Diagnosis not present

## 2018-10-25 DIAGNOSIS — H353122 Nonexudative age-related macular degeneration, left eye, intermediate dry stage: Secondary | ICD-10-CM | POA: Diagnosis not present

## 2018-10-25 DIAGNOSIS — H5212 Myopia, left eye: Secondary | ICD-10-CM | POA: Diagnosis not present

## 2018-10-25 DIAGNOSIS — H35371 Puckering of macula, right eye: Secondary | ICD-10-CM | POA: Diagnosis not present

## 2018-10-25 DIAGNOSIS — H26493 Other secondary cataract, bilateral: Secondary | ICD-10-CM | POA: Diagnosis not present

## 2018-10-30 DIAGNOSIS — Z23 Encounter for immunization: Secondary | ICD-10-CM | POA: Diagnosis not present

## 2018-11-11 DIAGNOSIS — G4733 Obstructive sleep apnea (adult) (pediatric): Secondary | ICD-10-CM | POA: Diagnosis not present

## 2018-11-27 ENCOUNTER — Encounter: Payer: Self-pay | Admitting: Orthopaedic Surgery

## 2018-11-27 ENCOUNTER — Ambulatory Visit (INDEPENDENT_AMBULATORY_CARE_PROVIDER_SITE_OTHER): Payer: Medicare Other

## 2018-11-27 ENCOUNTER — Ambulatory Visit (INDEPENDENT_AMBULATORY_CARE_PROVIDER_SITE_OTHER): Payer: Medicare Other | Admitting: Orthopaedic Surgery

## 2018-11-27 DIAGNOSIS — G8929 Other chronic pain: Secondary | ICD-10-CM | POA: Diagnosis not present

## 2018-11-27 DIAGNOSIS — M25561 Pain in right knee: Secondary | ICD-10-CM

## 2018-11-27 DIAGNOSIS — M1711 Unilateral primary osteoarthritis, right knee: Secondary | ICD-10-CM

## 2018-11-27 NOTE — Progress Notes (Signed)
Office Visit Note   Patient: Judy White           Date of Birth: [birth year]           MRN: [id number] Visit Date: 11/27/2018              Requested by: Josetta Huddle, MD [address] [place],  [place] [address] PCP: Josetta Huddle, MD   Assessment & Plan: Visit Diagnoses:  1. Chronic pain of right knee   2. Unilateral primary osteoarthritis, right knee     Plan: I went over her x-rays with her as well as the knee model and explained in detail what knee replacement surgery involves.  I talked her about her osteoarthritis and how she is failed conservative treatment.  This point she does wish proceed with a knee replacement surgery given the failure of conservative treatment and her continued pain.  She is also concerned about falling.  We talked about her interoperative and postoperative course.  I described in detail what knee replacement surgery involves.  We talked about the risk and benefits of surgery as well as the possibility of needing skilled nursing afterwards due to her husband being elderly and the lack of family support at home.  We would only want her to be in skilled nursing for up to a week and she understands that as well.  All question concerns were answered addressed.  We will work on getting surgery scheduled.  Follow-Up Instructions: Return for 2 weeks post-op.   Orders:  Orders Placed This Encounter  Procedures  . XR Knee 1-2 Views Right   No orders of the defined types were placed in this encounter.     Procedures: No procedures performed   Clinical Data: No additional findings.   Subjective: Chief Complaint  Patient presents with  . Right Knee - Pain  The patient is a very pleasant 78 year old female sent from Dr. Inda Merlin to evaluate and treat osteoarthritis of her right knee.  She has had numerous injections in that knee over the past.  Her right knee pain is daily and is been hurting for multiple years now at least over 5 years and  getting worse.  She is become a fall risk as well.  She did fall earlier this year.  She said the injections used to work but they now wear off quickly.  Her right knee hurts significantly.  At this point her right knee pain is daily and it is 10 out of 10.  Her right knee pain is definitely affecting her mobility, her quality of life and her actives of daily living.  Given the failure of conservative treatment and her pain combined with her fall risk she does wish to proceed with a total knee arthroplasty at this point.  She does have a history of a cardiac ablation.  She has no cardiac issues at this point.  She is not on a blood thinner other than the 81 mg aspirin daily.  HPI  Review of Systems She currently denies a headache, chest pain, shortness of breath, fever, chills, nausea, vomiting  Objective: Vital Signs: There were no vitals taken for this visit.  Physical Exam She is alert and orient x3 and in no acute distress Ortho Exam Examination of her right knee shows a mild to moderate effusion.  She has significant medial and lateral joint line tenderness.  There is neutral overall alignment but she does tend to have slight valgus alignment when she  first stands.  Her range of motion is full but very painful.  Her knee is ligamentously stable. Specialty Comments:  No specialty comments available.  Imaging: Xr Knee 1-2 Views Right  Result Date: 11/27/2018 2 views of the right knee show tricompartmental arthritic changes mainly involving the lateral compartment of the patellofemoral joint.  There is significant medial narrowing as well.  The bone is osteopenic.    PMFS History: Patient Active Problem List   Diagnosis Date Noted  . Unilateral primary osteoarthritis, right knee 11/27/2018  . Rib fracture 04/22/2018  . Pneumothorax on left 01/24/2016  . Abnormal CT of the chest   . Pulmonary embolism ([place])   . Interstitial lung disease ([place]) 10/08/2015  . Paroxysmal atrial  fibrillation (HCC)   . Typical atrial flutter ([place])   . Chronic diastolic heart failure ([place]) 08/25/2014  . Lumbar stenosis 08/11/2014  . On amiodarone therapy 05/21/2013  . Hypercholesteremia   . Essential hypertension   . Myalgia and myositis, unspecified   . COPD GOLD 0    . Overactive bladder    Past Medical History:  Diagnosis Date  . Agoraphobia   . Anxiety   . Arthritis    "normal for the age; nothing gives me problems" (01/24/2016)  . COPD (chronic obstructive pulmonary disease) ([facility])    "mild" (01/24/2016)  . Depressive disorder, not elsewhere classified   . DVT (deep venous thrombosis) ([facility]) 10/08/2015   "they said the blood clot went from one of my legs to my lung"  . History of hiatal hernia    "small one" (01/24/2016)  . Hypercholesteremia   . Hypertension    pt denies this hx on 01/24/2016  . Myalgia and myositis, unspecified   . Osteopenia   . Overactive bladder   . Persistent atrial fibrillation    a. s/p Watchman implant 12/2014  . Pulmonary embolism ([place]) 10/08/2015    Family History  Problem Relation Age of Onset  . Aneurysm Father   . Hypotension Mother   . Neuropathy Brother   . Heart disease Brother   . Cancer Other        maternal side  . Heart Problems Other        paternal side  . Heart attack Neg Hx     Past Surgical History:  Procedure Laterality Date  . APPENDECTOMY  1983  . BACK SURGERY    . BRONCHOSCOPY  01/24/2016  . CARDIAC CATHETERIZATION     >5 yrs  . CATARACT EXTRACTION W/ INTRAOCULAR LENS  IMPLANT, BILATERAL Bilateral   . CESAREAN SECTION  1967  . LEFT ATRIAL APPENDAGE OCCLUSION N/A 12/31/2014   Procedure: LEFT ATRIAL APPENDAGE OCCLUSION;  Surgeon: Thompson Grayer, MD;  Location: [facility];  Service: Cardiovascular;  Laterality: N/A;  . POSTERIOR FUSION LUMBAR SPINE Bilateral 07/2014  . SHOULDER ARTHROSCOPY W/ ROTATOR CUFF REPAIR Left   . SHOULDER SURGERY Right    "tendon broke; couldn't be repaired"  . TEE WITHOUT  CARDIOVERSION N/A 12/22/2014   Procedure: TRANSESOPHAGEAL ECHOCARDIOGRAM (TEE);  Surgeon: Pixie Casino, MD;  Location: [facility] ENDOSCOPY;  Service: Cardiovascular;  Laterality: N/A;  . TEE WITHOUT CARDIOVERSION N/A 02/16/2015   post Watchman TEE with good seal and no leak around device   . TONSILLECTOMY    . TOTAL ABDOMINAL HYSTERECTOMY    . VIDEO BRONCHOSCOPY Bilateral 01/24/2016   Procedure: VIDEO BRONCHOSCOPY WITH FLUORO;  Surgeon: Collene Gobble, MD;  Location: [facility];  Service: Cardiopulmonary;  Laterality: Bilateral;  .  WISDOM TOOTH EXTRACTION     Social History   Occupational History  . Not on file  Tobacco Use  . Smoking status: Former Smoker    Packs/day: 0.50    Years: 60.00    Pack years: 30.00    Types: Cigarettes    Quit date: 10/08/2015    Years since quitting: 3.1  . Smokeless tobacco: Never Used  Substance and Sexual Activity  . Alcohol use: Yes    Alcohol/week: 0.0 standard drinks    Comment: occ  . Drug use: No  . Sexual activity: Not on file

## 2018-12-11 ENCOUNTER — Other Ambulatory Visit: Payer: Self-pay

## 2018-12-16 ENCOUNTER — Other Ambulatory Visit: Payer: Self-pay | Admitting: Physician Assistant

## 2018-12-20 NOTE — Patient Instructions (Addendum)
DUE TO COVID-19 ONLY ONE VISITOR IS ALLOWED TO COME WITH YOU AND STAY IN THE WAITING ROOM ONLY DURING PRE OP AND PROCEDURE DAY OF SURGERY. THE 1 VISITOR MAY VISIT WITH YOU AFTER SURGERY IN YOUR PRIVATE ROOM DURING VISITING HOURS ONLY!  YOU NEED TO HAVE A COVID 19 TEST ON__Tuesday 10/27/2020_____ @___8 :30AM____, THIS TEST MUST BE DONE BEFORE SURGERY, COME  [address], [place] , [id number].  ([place]) ONCE YOUR COVID TEST IS COMPLETED, PLEASE BEGIN THE QUARANTINE INSTRUCTIONS AS OUTLINED IN YOUR HANDOUT.                 Chestine Theriault White     Your procedure is scheduled on: Friday 12/27/2018   Report to [facility] Main  Entrance    Report to admitting at  0830 AM     Call this number if you have problems the morning of surgery [phone]      PLEASE BRING CPAP MASK AND  TUBING ONLY. DEVICE WILL BE PROVIDED!   Remember: Do not eat food  :After Midnight. NO SOLID FOOD AFTER MIDNIGHT THE NIGHT PRIOR TO SURGERY. NOTHING BY MOUTH EXCEPT CLEAR LIQUIDS UNTIL  0800 am. PLEASE FINISH ENSURE DRINK PER SURGEON ORDER  WHICH NEEDS TO BE COMPLETED AT  0800 am .    CLEAR LIQUID DIET    Foods Allowed                                                                     Foods Excluded  Coffee and tea, regular and decaf                             liquids that you cannot  Plain Jell-O any favor except red or purple                                           see through such as: Fruit ices (not with fruit pulp)                                     milk, soups, orange juice  Iced Popsicles                                    All solid food Carbonated beverages, regular and diet                                    Cranberry, grape and apple juices Sports drinks like Gatorade Lightly seasoned clear broth or consume(fat free) Sugar, honey syrup  Sample Menu Breakfast                                Lunch  Supper Cranberry juice                     Beef broth                            Chicken broth Jell-O                                     Grape juice                           Apple juice Coffee or tea                        Jell-O                                      Popsicle                                                Coffee or tea                        Coffee or tea  _____________________________________________________________________     BRUSH YOUR TEETH MORNING OF SURGERY AND RINSE YOUR MOUTH OUT, NO CHEWING GUM CANDY OR MINTS.     Take these medicines the morning of surgery with A SIP OF WATER: Clonazepam (Klonopin), Gabapentin (Neurontin), Fluoxetine (Prozac), Metoprolol succinate (Toprol-XL), Diltiazem (Cardiazem) if needed                                 You may not have any metal on your body including hair pins and              piercings  Do not wear jewelry, make-up, lotions, powders or perfumes, deodorant             Do not wear nail polish on your fingernails.  Do not shave  48 hours prior to surgery.                 Do not bring valuables to the hospital. [facility].  Contacts, dentures or bridgework may not be worn into surgery.  Leave suitcase in the car. After surgery it may be brought to your room.                  Please read over the following fact sheets you were given: _____________________________________________________________________             [facility] - Preparing for Surgery Before surgery, you can play an important role.  Because skin is not sterile, your skin needs to be as free of germs as possible.  You can reduce the number of germs on your skin by washing with CHG (chlorahexidine gluconate) soap before surgery.  CHG is an antiseptic cleaner which kills germs and bonds with the skin to continue killing germs even after washing. Please DO NOT use  if you have an allergy to CHG or antibacterial soaps.  If your skin becomes  reddened/irritated stop using the CHG and inform your nurse when you arrive at Short Stay. Do not shave (including legs and underarms) for at least 48 hours prior to the first CHG shower.  You may shave your face/neck. Please follow these instructions carefully:  1.  Shower with CHG Soap the night before surgery and the  morning of Surgery.  2.  If you choose to wash your hair, wash your hair first as usual with your  normal  shampoo.  3.  After you shampoo, rinse your hair and body thoroughly to remove the  shampoo.                           4.  Use CHG as you would any other liquid soap.  You can apply chg directly  to the skin and wash                       Gently with a scrungie or clean washcloth.  5.  Apply the CHG Soap to your body ONLY FROM THE NECK DOWN.   Do not use on face/ open                           Wound or open sores. Avoid contact with eyes, ears mouth and genitals (private parts).                       Wash face,  Genitals (private parts) with your normal soap.             6.  Wash thoroughly, paying special attention to the area where your surgery  will be performed.  7.  Thoroughly rinse your body with warm water from the neck down.  8.  DO NOT shower/wash with your normal soap after using and rinsing off  the CHG Soap.                9.  Pat yourself dry with a clean towel.            10.  Wear clean pajamas.            11.  Place clean sheets on your bed the night of your first shower and do not  sleep with pets. Day of Surgery : Do not apply any lotions/deodorants the morning of surgery.  Please wear clean clothes to the hospital/surgery center.  FAILURE TO FOLLOW THESE INSTRUCTIONS MAY RESULT IN THE CANCELLATION OF YOUR SURGERY PATIENT SIGNATURE_________________________________  NURSE SIGNATURE__________________________________  ________________________________________________________________________   Judy White  An incentive spirometer is a tool that  can help keep your lungs clear and active. This tool measures how well you are filling your lungs with each breath. Taking long deep breaths may help reverse or decrease the chance of developing breathing (pulmonary) problems (especially infection) following:  A long period of time when you are unable to move or be active. BEFORE THE PROCEDURE   If the spirometer includes an indicator to show your best effort, your nurse or respiratory therapist will set it to a desired goal.  If possible, sit up straight or lean slightly forward. Try not to slouch.  Hold the incentive spirometer in an upright position. INSTRUCTIONS FOR USE  1. Sit on the edge of your bed if possible, or  sit up as far as you can in bed or on a chair. 2. Hold the incentive spirometer in an upright position. 3. Breathe out normally. 4. Place the mouthpiece in your mouth and seal your lips tightly around it. 5. Breathe in slowly and as deeply as possible, raising the piston or the ball toward the top of the column. 6. Hold your breath for 3-5 seconds or for as long as possible. Allow the piston or ball to fall to the bottom of the column. 7. Remove the mouthpiece from your mouth and breathe out normally. 8. Rest for a few seconds and repeat Steps 1 through 7 at least 10 times every 1-2 hours when you are awake. Take your time and take a few normal breaths between deep breaths. 9. The spirometer may include an indicator to show your best effort. Use the indicator as a goal to work toward during each repetition. 10. After each set of 10 deep breaths, practice coughing to be sure your lungs are clear. If you have an incision (the cut made at the time of surgery), support your incision when coughing by placing a pillow or rolled up towels firmly against it. Once you are able to get out of bed, walk around indoors and cough well. You may stop using the incentive spirometer when instructed by your caregiver.  RISKS AND  COMPLICATIONS  Take your time so you do not get dizzy or light-headed.  If you are in pain, you may need to take or ask for pain medication before doing incentive spirometry. It is harder to take a deep breath if you are having pain. AFTER USE  Rest and breathe slowly and easily.  It can be helpful to keep track of a log of your progress. Your caregiver can provide you with a simple table to help with this. If you are using the spirometer at home, follow these instructions: [place] IF:   You are having difficultly using the spirometer.  You have trouble using the spirometer as often as instructed.  Your pain medication is not giving enough relief while using the spirometer.  You develop fever of 100.5 F (38.1 C) or higher. SEEK IMMEDIATE MEDICAL CARE IF:   You cough up bloody sputum that had not been present before.  You develop fever of 102 F (38.9 C) or greater.  You develop worsening pain at or near the incision site. MAKE SURE YOU:   Understand these instructions.  Will watch your condition.  Will get help right away if you are not doing well or get worse. Document Released: 06/26/2006 Document Revised: 05/08/2011 Document Reviewed: 08/27/2006 [facility] Patient Information 2014 [place], [place].   ________________________________________________________________________

## 2018-12-23 ENCOUNTER — Other Ambulatory Visit: Payer: Self-pay

## 2018-12-23 ENCOUNTER — Encounter (HOSPITAL_COMMUNITY)
Admission: RE | Admit: 2018-12-23 | Discharge: 2018-12-23 | Disposition: A | Discharge status: Home / Self Care | Payer: Medicare Other | Source: Ambulatory Visit | Attending: Orthopaedic Surgery | Admitting: Orthopaedic Surgery

## 2018-12-23 ENCOUNTER — Encounter (HOSPITAL_COMMUNITY): Payer: Self-pay

## 2018-12-23 DIAGNOSIS — N3281 Overactive bladder: Secondary | ICD-10-CM | POA: Diagnosis not present

## 2018-12-23 DIAGNOSIS — Z86711 Personal history of pulmonary embolism: Secondary | ICD-10-CM | POA: Insufficient documentation

## 2018-12-23 DIAGNOSIS — Z86718 Personal history of other venous thrombosis and embolism: Secondary | ICD-10-CM | POA: Insufficient documentation

## 2018-12-23 DIAGNOSIS — Z7982 Long term (current) use of aspirin: Secondary | ICD-10-CM | POA: Insufficient documentation

## 2018-12-23 DIAGNOSIS — F329 Major depressive disorder, single episode, unspecified: Secondary | ICD-10-CM | POA: Insufficient documentation

## 2018-12-23 DIAGNOSIS — R001 Bradycardia, unspecified: Secondary | ICD-10-CM | POA: Insufficient documentation

## 2018-12-23 DIAGNOSIS — I4819 Other persistent atrial fibrillation: Secondary | ICD-10-CM | POA: Diagnosis not present

## 2018-12-23 DIAGNOSIS — M1711 Unilateral primary osteoarthritis, right knee: Secondary | ICD-10-CM | POA: Diagnosis not present

## 2018-12-23 DIAGNOSIS — F419 Anxiety disorder, unspecified: Secondary | ICD-10-CM | POA: Diagnosis not present

## 2018-12-23 DIAGNOSIS — R9431 Abnormal electrocardiogram [ECG] [EKG]: Secondary | ICD-10-CM | POA: Diagnosis not present

## 2018-12-23 DIAGNOSIS — J449 Chronic obstructive pulmonary disease, unspecified: Secondary | ICD-10-CM | POA: Diagnosis not present

## 2018-12-23 DIAGNOSIS — Z01818 Encounter for other preprocedural examination: Secondary | ICD-10-CM | POA: Insufficient documentation

## 2018-12-23 DIAGNOSIS — Z79899 Other long term (current) drug therapy: Secondary | ICD-10-CM | POA: Insufficient documentation

## 2018-12-23 DIAGNOSIS — I10 Essential (primary) hypertension: Secondary | ICD-10-CM | POA: Insufficient documentation

## 2018-12-23 DIAGNOSIS — G4733 Obstructive sleep apnea (adult) (pediatric): Secondary | ICD-10-CM | POA: Insufficient documentation

## 2018-12-23 HISTORY — DX: Sleep apnea, unspecified: G47.30

## 2018-12-23 HISTORY — DX: Presence of other cardiac implants and grafts: Z95.818

## 2018-12-23 LAB — CBC
HCT: 44.8 % (ref 36.0–46.0)
Hemoglobin: 14.4 g/dL (ref 12.0–15.0)
MCH: 29.1 pg (ref 26.0–34.0)
MCHC: 32.1 g/dL (ref 30.0–36.0)
MCV: 90.5 fL (ref 80.0–100.0)
Platelets: 216 10*3/uL (ref 150–400)
RBC: 4.95 MIL/uL (ref 3.87–5.11)
RDW: 12.7 % (ref 11.5–15.5)
WBC: 9.7 10*3/uL (ref 4.0–10.5)
nRBC: 0 % (ref 0.0–0.2)

## 2018-12-23 LAB — BASIC METABOLIC PANEL
Anion gap: 10 (ref 5–15)
BUN: 20 mg/dL (ref 8–23)
CO2: 28 mmol/L (ref 22–32)
Calcium: 8.9 mg/dL (ref 8.9–10.3)
Chloride: 100 mmol/L (ref 98–111)
Creatinine, Ser: 0.84 mg/dL (ref 0.44–1.00)
GFR calc Af Amer: >60 mL/min (ref >60)
GFR calc non Af Amer: >60 mL/min (ref >60)
Glucose, Bld: 101 mg/dL — ABNORMAL HIGH (ref 70–99)
Potassium: 4.2 mmol/L (ref 3.5–5.1)
Sodium: 138 mmol/L (ref 135–145)

## 2018-12-23 LAB — SURGICAL PCR SCREEN
MRSA, PCR: NEGATIVE
Staphylococcus aureus: NEGATIVE

## 2018-12-23 NOTE — Progress Notes (Addendum)
PCP - Architectural technologist - primary Dr.  [name], EP Dr Jake Seats  Pulmonary - Dr Gillermina Phy  Chest x-ray -  EKG -  Stress Test -  ECHO -  Cardiac Cath - 06-28-2017  Sleep Study -  CPAP - OSa with use of CPAP nightly .   Fasting Blood Sugar -  Checks Blood Sugar _____ times a day  Blood Thinner Instructions: Aspirin Instructions: patient reports she has not received instructions about her Aspirin , RN advised patient to contact surgeons office regarding this .  Last Dose:  Anesthesia review:  Patient hx of Afib , she reports she underwent Afib ablation 06-28-2017 with a f/u Holter monitor study at [facility] that was successful and has maintained sinus rhythm since that time with no chest pain, SOB, nor palpitations.   EKG today demonstrates sinus brady HR 54. She also reports in 2016 she had a watchman placed and this is followed by EP Dr Charna Archer Angelina Ok. She reports that she was "released" from Dr [name] and none of her cardiologists are aware she is having this upcoming surgery. RN consulted with APP Levada Dy to make aware of the abovementioned. Chart given to APP for extended review.    Patient denies shortness of breath, fever, cough and chest pain at PAT appointment   Patient verbalized understanding of instructions that were given to them at the PAT appointment. Patient was also instructed that they will need to review over the PAT instructions again at home before surgery.

## 2018-12-24 ENCOUNTER — Other Ambulatory Visit (HOSPITAL_COMMUNITY)
Admission: RE | Admit: 2018-12-24 | Discharge: 2018-12-24 | Disposition: A | Discharge status: Home / Self Care | Payer: Medicare Other | Source: Ambulatory Visit | Attending: Orthopaedic Surgery | Admitting: Orthopaedic Surgery

## 2018-12-24 DIAGNOSIS — Z01812 Encounter for preprocedural laboratory examination: Secondary | ICD-10-CM | POA: Diagnosis not present

## 2018-12-24 DIAGNOSIS — Z20828 Contact with and (suspected) exposure to other viral communicable diseases: Secondary | ICD-10-CM | POA: Insufficient documentation

## 2018-12-24 NOTE — Anesthesia Preprocedure Evaluation (Addendum)
Anesthesia Evaluation  Patient identified by MRN, date of birth, ID band Patient awake    Reviewed: Allergy & Precautions, NPO status , Patient's Chart, lab work & pertinent test results, reviewed documented beta blocker date and time   Airway Mallampati: II  TM Distance: >3 FB Neck ROM: Full    Dental no notable dental hx.    Pulmonary sleep apnea and Continuous Positive Airway Pressure Ventilation , COPD, former smoker, PE   Pulmonary exam normal breath sounds clear to auscultation       Cardiovascular hypertension, Pt. on home beta blockers + DVT  Normal cardiovascular exam+ dysrhythmias Atrial Fibrillation  Rhythm:Regular Rate:Normal  ECG: SB, rate 54   Neuro/Psych PSYCHIATRIC DISORDERS Anxiety Depression  Neuromuscular disease    GI/Hepatic Neg liver ROS, hiatal hernia,   Endo/Other  negative endocrine ROS  Renal/GU negative Renal ROS     Musculoskeletal  (+) Arthritis ,   Abdominal (+) + obese,   Peds  Hematology HLD   Anesthesia Other Findings osteoarthritis, degenerative joint disease right knee  Reproductive/Obstetrics                           Anesthesia Physical Anesthesia Plan  ASA: III  Anesthesia Plan: Spinal and Regional   Post-op Pain Management:  Regional for Post-op pain   Induction: Intravenous  PONV Risk Score and Plan: 2 and Ondansetron, Dexamethasone and Treatment may vary due to age or medical condition  Airway Management Planned: Natural Airway  Additional Equipment:   Intra-op Plan:   Post-operative Plan:   Informed Consent: I have reviewed the patients History and Physical, chart, labs and discussed the procedure including the risks, benefits and alternatives for the proposed anesthesia with the patient or authorized representative who has indicated his/her understanding and acceptance.     Dental advisory given  Plan Discussed with:  CRNA  Anesthesia Plan Comments: (Reviewed APP note by Durel Salts, FNP)       Anesthesia Quick Evaluation

## 2018-12-24 NOTE — Progress Notes (Signed)
Anesthesia Chart Review:   Case: [id number] Date/Time: 12/27/18 1045   Procedure: RIGHT TOTAL KNEE ARTHROPLASTY (Right Knee)   Anesthesia type: Spinal   Pre-op diagnosis: osteoarthritis, degenerative joint disease right knee   Location: [facility] ROOM 10 / [facility]   Surgeon: Mcarthur Rossetti, MD      DISCUSSION:  - Pt is an 78 year old female with hx persistent atrial fibrillation (s/p Watchman device 2016) s/p ablation 06/28/17 at [facility] now maintaining SR.  - Other hx includes: DVT, PE, HTN, COPD, OSA   VS: BP (!) 105/55 (BP Location: Right Arm)   Pulse (!) 58   Temp 36.7 C (Oral)   Resp 18   Ht 5\' 6"  (1.676 m)   Wt 86.3 kg   SpO2 95%   BMI 30.72 kg/m    PROVIDERS: - PCP is Josetta Huddle, MD - Cardiologist is [name], MD. Last office visit 11/23/17; 12 month f/u recommended - EP cardiologist is Lawson Radar, MD at [facility]. Last office visit 03/11/18; f/u prn recommended (notes in care everywhere)    LABS: Labs reviewed: Acceptable for surgery. (all labs ordered are listed, but only abnormal results are displayed)  Labs Reviewed  BASIC METABOLIC PANEL - Abnormal; Notable for the following components:      Result Value   Glucose, Bld 101 (*)    All other components within normal limits  SURGICAL PCR SCREEN  CBC     IMAGES:   EKG 12/23/18: Sinus bradycardia (54 bpm). Low voltage QRS. Cannot rule out Anteroseptal infarct, age undetermined. Septal infarct intermittently present on EKG dating to at least 2017  CV:  MRI cardiac morphology 06/27/17 (care everywhere):  - The cardiac rhythm during the examination was rapid atrial fibrillation. 1. The left ventricle is lower limits of normal of size to small in size.  There are no regional wall motion abnormalities.  Global systolic function is low normal to mildly reduced in the setting of rapid atrial fibrillation.  The LVEF is visually estimated at 50-55%. 2. The right ventricle is normal in cavity size, wall  thickness, and systolic function. 3. There is severe bi-atrial enlargement. 4. The aortic valve is trileaflet in morphology.  There is no significant aortic stenosis. 5.  Valvular regurgitation is not well evaluated due to use of real time imaging.  However, there is moderate-severe tricuspid regurgitation. 6. Delayed enhancement imaging demonstrates no evidence of myocardial infarction, scarring, or infiltration. 7.  There is no significant pericardial effusion.   Nuclear stress test 06/18/17 (care everywhere):  - Myocardial perfusion imaging is normal. No evidence of regadenoson-inducible ischemia.  - Normal left ventricular systolic function.   Echo 10/09/15:  - Left ventricle: The cavity size was normal. Systolic function was normal. Wall motion was normal; there were no regional wall motion abnormalities. The study is not technically sufficient to allow evaluation of LV diastolic function. - Aortic valve: Transvalvular velocity was within the normal range. There was no stenosis. There was no regurgitation. - Mitral valve: Transvalvular velocity was within the normal range. There was no evidence for stenosis. There was no regurgitation. - Right ventricle: The cavity size was normal. Wall thickness was normal. Systolic function was normal. - Atrial septum: No defect or patent foramen ovale was identified by color flow Doppler. - Tricuspid valve: There was trivial regurgitation. - Pulmonary arteries: Systolic pressure was within the normal range. PA peak pressure: 28 mm Hg (S).   Past Medical History:  Diagnosis Date  . Agoraphobia   .  Anxiety   . Arthritis    "normal for the age; nothing gives me problems" (01/24/2016)  . COPD (chronic obstructive pulmonary disease) ([place])    "mild" (01/24/2016)  . Depressive disorder, not elsewhere classified   . DVT (deep venous thrombosis) ([place]) 10/08/2015   "they said the blood clot went from one of my legs to my lung"  . History of hiatal  hernia    "small one" (01/24/2016)  . Hypercholesteremia   . Hypertension    pt denies this hx on 01/24/2016  . Myalgia and myositis, unspecified   . Osteopenia   . Overactive bladder   . Persistent atrial fibrillation ([place])    a. s/p Watchman implant 12/2014  . Presence of Watchman left atrial appendage closure device   . Pulmonary embolism ([place]) 10/08/2015  . Sleep apnea    USES CPAP     Past Surgical History:  Procedure Laterality Date  . APPENDECTOMY  1983  . ATRIAL FIBRILLATION ABLATION  06/28/2017   PER PATIENT SUCCESSFUL ABLATION , CARDIO [place] , [place]  . BACK SURGERY    . BRONCHOSCOPY  01/24/2016  . CARDIAC CATHETERIZATION     >5 yrs  . CATARACT EXTRACTION W/ INTRAOCULAR LENS  IMPLANT, BILATERAL Bilateral   . CESAREAN SECTION  1967  . LEFT ATRIAL APPENDAGE OCCLUSION N/A 12/31/2014   Procedure: LEFT ATRIAL APPENDAGE OCCLUSION;  Surgeon: Thompson Grayer, MD;  Location: [place] CV LAB;  Service: Cardiovascular;  Laterality: N/A;  . POSTERIOR FUSION LUMBAR SPINE Bilateral 07/2014  . SHOULDER ARTHROSCOPY W/ ROTATOR CUFF REPAIR Left   . SHOULDER SURGERY Right    "tendon broke; couldn't be repaired"  . TEE WITHOUT CARDIOVERSION N/A 12/22/2014   Procedure: TRANSESOPHAGEAL ECHOCARDIOGRAM (TEE);  Surgeon: Pixie Casino, MD;  Location: [facility] ENDOSCOPY;  Service: Cardiovascular;  Laterality: N/A;  . TEE WITHOUT CARDIOVERSION N/A 02/16/2015   post Watchman TEE with good seal and no leak around device   . TONSILLECTOMY    . TOOTH EXTRACTION  05/2018  . TOTAL ABDOMINAL HYSTERECTOMY    . VIDEO BRONCHOSCOPY Bilateral 01/24/2016   Procedure: VIDEO BRONCHOSCOPY WITH FLUORO;  Surgeon: Collene Gobble, MD;  Location: [place];  Service: Cardiopulmonary;  Laterality: Bilateral;  . WISDOM TOOTH EXTRACTION      MEDICATIONS: . acetaminophen (TYLENOL) 500 MG tablet  . aspirin EC 81 MG tablet  . Benfotiamine 150 MG CAPS  . Calcium-Vitamin D (CALTRATE 600  PLUS-VIT D PO)  . Cholecalciferol (VITAMIN D3) 2000 units TABS  . clonazePAM (KLONOPIN) 1 MG tablet  . diltiazem (CARDIZEM) 30 MG tablet  . FLUoxetine (PROZAC) 20 MG capsule  . furosemide (LASIX) 20 MG tablet  . gabapentin (NEURONTIN) 100 MG capsule  . metoprolol succinate (TOPROL-XL) 25 MG 24 hr tablet  . oxybutynin (DITROPAN-XL) 10 MG 24 hr tablet  . Potassium 99 MG TABS  . vitamin C (ASCORBIC ACID) 500 MG tablet  . vitamin E (VITAMIN E) 400 UNIT capsule   No current facility-administered medications for this encounter.     If no changes, I anticipate pt can proceed with surgery as scheduled.   Willeen Cass, FNP-BC [facility]/Anesthesiology Phone: [phone] 12/24/2018 10:33 AM

## 2018-12-25 LAB — NOVEL CORONAVIRUS, NAA (HOSP ORDER, SEND-OUT TO REF LAB; TAT 18-24 HRS): SARS-CoV-2, NAA: NOT DETECTED

## 2018-12-26 ENCOUNTER — Telehealth: Payer: Self-pay | Admitting: *Deleted

## 2018-12-26 NOTE — Care Plan (Signed)
Call to patient to review Ortho bundle. Patient scheduled for Right total knee arthroplasty with Dr. Ninfa Linden on Friday, 12/27/18 at [facility]. She verbalized her husband would be able to assist after hospital stay at home. Anticipate home health PT. Choice provided. [facility] referral made to liaison. Reviewed all information related to Ortho bundle and aftercare for surgery. Patient verbalized she would like to schedule OPPT with [facility] here at Dr. Trevor Mace office. Reviewed follow up appointment scheduled on 01/09/19. Patient would need FWW and 3 in 1 for home use. Will make referral to DME company and plan for delivery to hospital room prior to discharge.

## 2018-12-26 NOTE — Telephone Encounter (Signed)
Ortho bundle pre-op call completed. 

## 2018-12-26 NOTE — H&P (Signed)
TOTAL KNEE ADMISSION H&P  Patient is being admitted for right total knee arthroplasty.  Subjective:  Chief Complaint:right knee pain.  HPI: Judy White, 78 y.o. female, has a history of pain and functional disability in the right knee due to arthritis and has failed non-surgical conservative treatments for greater than 12 weeks to includeNSAID's and/or analgesics, corticosteriod injections, viscosupplementation injections, flexibility and strengthening excercises, supervised PT with diminished ADL's post treatment, use of assistive devices, weight reduction as appropriate and activity modification.  Onset of symptoms was gradual, starting 5 years ago with gradually worsening course since that time. The patient noted no past surgery on the right knee(s).  Patient currently rates pain in the right knee(s) at 10 out of 10 with activity. Patient has night pain, worsening of pain with activity and weight bearing, pain that interferes with activities of daily living, pain with passive range of motion, crepitus and joint swelling.  Patient has evidence of subchondral sclerosis, periarticular osteophytes and joint space narrowing by imaging studies. There is no active infection.  Patient Active Problem List   Diagnosis Date Noted  . Unilateral primary osteoarthritis, right knee 11/27/2018  . Rib fracture 04/22/2018  . Pneumothorax on left 01/24/2016  . Abnormal CT of the chest   . Pulmonary embolism ([place])   . Interstitial lung disease ([place]) 10/08/2015  . Paroxysmal atrial fibrillation ([facility])   . Typical atrial flutter ([place])   . Chronic diastolic heart failure ([place]) 08/25/2014  . Lumbar stenosis 08/11/2014  . On amiodarone therapy 05/21/2013  . Hypercholesteremia   . Essential hypertension   . Myalgia and myositis, unspecified   . COPD GOLD 0    . Overactive bladder    Past Medical History:  Diagnosis Date  . Agoraphobia   . Anxiety   . Arthritis    "normal for the age; nothing gives me  problems" (01/24/2016)  . COPD (chronic obstructive pulmonary disease) ([place])    "mild" (01/24/2016)  . Depressive disorder, not elsewhere classified   . DVT (deep venous thrombosis) ([place]) 10/08/2015   "they said the blood clot went from one of my legs to my lung"  . History of hiatal hernia    "small one" (01/24/2016)  . Hypercholesteremia   . Hypertension    pt denies this hx on 01/24/2016  . Myalgia and myositis, unspecified   . Osteopenia   . Overactive bladder   . Persistent atrial fibrillation ([place])    a. s/p Watchman implant 12/2014  . Presence of Watchman left atrial appendage closure device   . Pulmonary embolism ([place]) 10/08/2015  . Sleep apnea    USES CPAP     Past Surgical History:  Procedure Laterality Date  . APPENDECTOMY  1983  . ATRIAL FIBRILLATION ABLATION  06/28/2017   PER PATIENT SUCCESSFUL ABLATION , CARDIO [place] , [place]  . BACK SURGERY    . BRONCHOSCOPY  01/24/2016  . CARDIAC CATHETERIZATION     >5 yrs  . CATARACT EXTRACTION W/ INTRAOCULAR LENS  IMPLANT, BILATERAL Bilateral   . CESAREAN SECTION  1967  . LEFT ATRIAL APPENDAGE OCCLUSION N/A 12/31/2014   Procedure: LEFT ATRIAL APPENDAGE OCCLUSION;  Surgeon: Thompson Grayer, MD;  Location: [place] CV LAB;  Service: Cardiovascular;  Laterality: N/A;  . POSTERIOR FUSION LUMBAR SPINE Bilateral 07/2014  . SHOULDER ARTHROSCOPY W/ ROTATOR CUFF REPAIR Left   . SHOULDER SURGERY Right    "tendon broke; couldn't be repaired"  . TEE WITHOUT CARDIOVERSION N/A 12/22/2014  Procedure: TRANSESOPHAGEAL ECHOCARDIOGRAM (TEE);  Surgeon: Pixie Casino, MD;  Location: [facility] ENDOSCOPY;  Service: Cardiovascular;  Laterality: N/A;  . TEE WITHOUT CARDIOVERSION N/A 02/16/2015   post Watchman TEE with good seal and no leak around device   . TONSILLECTOMY    . TOOTH EXTRACTION  05/2018  . TOTAL ABDOMINAL HYSTERECTOMY    . VIDEO BRONCHOSCOPY Bilateral 01/24/2016   Procedure: VIDEO BRONCHOSCOPY WITH FLUORO;   Surgeon: Collene Gobble, MD;  Location: [place];  Service: Cardiopulmonary;  Laterality: Bilateral;  . WISDOM TOOTH EXTRACTION      No current facility-administered medications for this encounter.    Current Outpatient Medications  Medication Sig Dispense Refill Last Dose  . acetaminophen (TYLENOL) 500 MG tablet Take 500 mg by mouth every 6 (six) hours as needed for moderate pain or headache.     Marland Kitchen aspirin EC 81 MG tablet Take 81 mg by mouth daily.     . Benfotiamine 150 MG CAPS Take 150 mg by mouth daily.     . Calcium-Vitamin D (CALTRATE 600 PLUS-VIT D PO) Take 1 tablet by mouth daily.      . Cholecalciferol (VITAMIN D3) 2000 units TABS Take 2,000 Units by mouth daily.     . clonazePAM (KLONOPIN) 1 MG tablet Take 1.5-2 mg by mouth daily.      Marland Kitchen diltiazem (CARDIZEM) 30 MG tablet Take 30 mg by mouth 4 (four) times daily as needed (irregular heartbeat).      Marland Kitchen FLUoxetine (PROZAC) 20 MG capsule Take 20 mg by mouth daily.      . furosemide (LASIX) 20 MG tablet TAKE 1 TABLET ONCE DAILY. (Patient taking differently: Take 20 mg by mouth daily. ) 90 tablet 2   . gabapentin (NEURONTIN) 100 MG capsule Take 100 mg by mouth 3 (three) times daily.     . metoprolol succinate (TOPROL-XL) 25 MG 24 hr tablet Take 12.5 mg by mouth every morning.     Marland Kitchen oxybutynin (DITROPAN-XL) 10 MG 24 hr tablet Take 10 mg by mouth daily.      . Potassium 99 MG TABS Take 99 mg by mouth daily.     . vitamin C (ASCORBIC ACID) 500 MG tablet Take 500 mg by mouth daily.     . vitamin E (VITAMIN E) 400 UNIT capsule Take 400 Units daily by mouth.      Allergies  Allergen Reactions  . Rosuvastatin     Myalgias on 5mg  every other day  . Statins     Social History   Tobacco Use  . Smoking status: Former Smoker    Packs/day: 0.50    Years: 60.00    Pack years: 30.00    Types: Cigarettes    Quit date: 10/08/2015    Years since quitting: 3.2  . Smokeless tobacco: Never Used  Substance Use Topics  . Alcohol use: Yes     Alcohol/week: 0.0 standard drinks    Comment: occ    Family History  Problem Relation Age of Onset  . Aneurysm Father   . Hypotension Mother   . Neuropathy Brother   . Heart disease Brother   . Cancer Other        maternal side  . Heart Problems Other        paternal side  . Heart attack Neg Hx      Review of Systems  Musculoskeletal: Positive for joint pain.  All other systems reviewed and are negative.   Objective:  Physical Exam  Constitutional:  She is oriented to person, place, and time. She appears well-developed and well-nourished.  HENT:  Head: Normocephalic and atraumatic.  Eyes: Pupils are equal, round, and reactive to light. EOM are normal.  Neck: Normal range of motion. Neck supple.  Cardiovascular: Normal rate and regular rhythm.  Respiratory: Effort normal and breath sounds normal.  GI: Soft. Bowel sounds are normal.  Musculoskeletal:     Right knee: She exhibits decreased range of motion, swelling, effusion, abnormal alignment, bony tenderness and abnormal meniscus. Tenderness found. Medial joint line and lateral joint line tenderness noted.  Neurological: She is alert and oriented to person, place, and time.  Skin: Skin is warm and dry.  Psychiatric: She has a normal mood and affect.    Vital signs in last 24 hours:    Labs:   Estimated body mass index is 30.72 kg/m as calculated from the following:   Height as of 12/23/18: 5\' 6"  (1.676 m).   Weight as of 12/23/18: 86.3 kg.   Imaging Review Plain radiographs demonstrate severe degenerative joint disease of the right knee(s). The overall alignment isneutral. The bone quality appears to be good for age and reported activity level.      Assessment/Plan:  End stage arthritis, right knee   The patient history, physical examination, clinical judgment of the provider and imaging studies are consistent with end stage degenerative joint disease of the right knee(s) and total knee arthroplasty is  deemed medically necessary. The treatment options including medical management, injection therapy arthroscopy and arthroplasty were discussed at length. The risks and benefits of total knee arthroplasty were presented and reviewed. The risks due to aseptic loosening, infection, stiffness, patella tracking problems, thromboembolic complications and other imponderables were discussed. The patient acknowledged the explanation, agreed to proceed with the plan and consent was signed. Patient is being admitted for inpatient treatment for surgery, pain control, PT, OT, prophylactic antibiotics, VTE prophylaxis, progressive ambulation and ADL's and discharge planning. The patient is planning to be discharged to skilled nursing facility

## 2018-12-27 ENCOUNTER — Other Ambulatory Visit: Payer: Self-pay

## 2018-12-27 ENCOUNTER — Encounter (HOSPITAL_COMMUNITY)
Admission: RE | Disposition: A | Discharge status: Home Health | Payer: Self-pay | Source: Home / Self Care | Attending: Orthopaedic Surgery

## 2018-12-27 ENCOUNTER — Other Ambulatory Visit: Payer: Self-pay | Admitting: *Deleted

## 2018-12-27 ENCOUNTER — Ambulatory Visit (HOSPITAL_COMMUNITY): Payer: Medicare Other | Admitting: Emergency Medicine

## 2018-12-27 ENCOUNTER — Encounter (HOSPITAL_COMMUNITY): Payer: Self-pay | Admitting: Certified Registered Nurse Anesthetist

## 2018-12-27 ENCOUNTER — Inpatient Hospital Stay (HOSPITAL_COMMUNITY)
Admission: RE | Admit: 2018-12-27 | Discharge: 2018-12-30 | DRG: 470 | Disposition: A | Discharge status: Home Health | Payer: Medicare Other | Attending: Orthopaedic Surgery | Admitting: Orthopaedic Surgery

## 2018-12-27 ENCOUNTER — Observation Stay (HOSPITAL_COMMUNITY): Payer: Medicare Other

## 2018-12-27 ENCOUNTER — Ambulatory Visit (HOSPITAL_COMMUNITY): Payer: Medicare Other | Admitting: Certified Registered Nurse Anesthetist

## 2018-12-27 DIAGNOSIS — N3281 Overactive bladder: Secondary | ICD-10-CM | POA: Diagnosis present

## 2018-12-27 DIAGNOSIS — Z86711 Personal history of pulmonary embolism: Secondary | ICD-10-CM

## 2018-12-27 DIAGNOSIS — Z95818 Presence of other cardiac implants and grafts: Secondary | ICD-10-CM | POA: Diagnosis not present

## 2018-12-27 DIAGNOSIS — I110 Hypertensive heart disease with heart failure: Secondary | ICD-10-CM | POA: Diagnosis present

## 2018-12-27 DIAGNOSIS — Z6830 Body mass index (BMI) 30.0-30.9, adult: Secondary | ICD-10-CM

## 2018-12-27 DIAGNOSIS — J449 Chronic obstructive pulmonary disease, unspecified: Secondary | ICD-10-CM | POA: Diagnosis not present

## 2018-12-27 DIAGNOSIS — Z8249 Family history of ischemic heart disease and other diseases of the circulatory system: Secondary | ICD-10-CM

## 2018-12-27 DIAGNOSIS — Z96651 Presence of right artificial knee joint: Secondary | ICD-10-CM

## 2018-12-27 DIAGNOSIS — I5032 Chronic diastolic (congestive) heart failure: Secondary | ICD-10-CM | POA: Diagnosis not present

## 2018-12-27 DIAGNOSIS — M1711 Unilateral primary osteoarthritis, right knee: Secondary | ICD-10-CM

## 2018-12-27 DIAGNOSIS — F419 Anxiety disorder, unspecified: Secondary | ICD-10-CM | POA: Diagnosis present

## 2018-12-27 DIAGNOSIS — G8918 Other acute postprocedural pain: Secondary | ICD-10-CM | POA: Diagnosis not present

## 2018-12-27 DIAGNOSIS — Z981 Arthrodesis status: Secondary | ICD-10-CM | POA: Diagnosis not present

## 2018-12-27 DIAGNOSIS — F329 Major depressive disorder, single episode, unspecified: Secondary | ICD-10-CM | POA: Diagnosis not present

## 2018-12-27 DIAGNOSIS — G4730 Sleep apnea, unspecified: Secondary | ICD-10-CM | POA: Diagnosis not present

## 2018-12-27 DIAGNOSIS — E669 Obesity, unspecified: Secondary | ICD-10-CM | POA: Diagnosis not present

## 2018-12-27 DIAGNOSIS — M8580 Other specified disorders of bone density and structure, unspecified site: Secondary | ICD-10-CM | POA: Diagnosis present

## 2018-12-27 DIAGNOSIS — Z87891 Personal history of nicotine dependence: Secondary | ICD-10-CM

## 2018-12-27 DIAGNOSIS — Z471 Aftercare following joint replacement surgery: Secondary | ICD-10-CM | POA: Diagnosis not present

## 2018-12-27 DIAGNOSIS — Z7982 Long term (current) use of aspirin: Secondary | ICD-10-CM

## 2018-12-27 HISTORY — PX: TOTAL KNEE ARTHROPLASTY: SHX125

## 2018-12-27 SURGERY — ARTHROPLASTY, KNEE, TOTAL
Anesthesia: Regional | Site: Knee | Laterality: Right

## 2018-12-27 MED ORDER — METHOCARBAMOL 500 MG PO TABS
500.0000 mg | ORAL_TABLET | Freq: Four times a day (QID) | ORAL | Status: DC | PRN
  Administered 2018-12-27 – 2018-12-29 (×3): 500 mg via ORAL
  Filled 2018-12-27 (×3): qty 1

## 2018-12-27 MED ORDER — EPHEDRINE SULFATE 50 MG/ML IJ SOLN
INTRAMUSCULAR | Status: DC | PRN
  Administered 2018-12-27 (×2): 10 mg via INTRAVENOUS
  Administered 2018-12-27: 15 mg via INTRAVENOUS

## 2018-12-27 MED ORDER — OXYCODONE HCL 5 MG PO TABS
5.0000 mg | ORAL_TABLET | ORAL | Status: DC | PRN
  Administered 2018-12-27 – 2018-12-28 (×3): 10 mg via ORAL
  Administered 2018-12-30 (×2): 5 mg via ORAL
  Filled 2018-12-27 (×2): qty 2
  Filled 2018-12-27: qty 1
  Filled 2018-12-27: qty 2
  Filled 2018-12-27: qty 1
  Filled 2018-12-27 (×3): qty 2

## 2018-12-27 MED ORDER — VITAMIN E 180 MG (400 UNIT) PO CAPS
400.0000 [IU] | ORAL_CAPSULE | Freq: Every day | ORAL | Status: DC
  Administered 2018-12-27 – 2018-12-30 (×4): 400 [IU] via ORAL
  Filled 2018-12-27 (×4): qty 1

## 2018-12-27 MED ORDER — ONDANSETRON HCL 4 MG/2ML IJ SOLN
INTRAMUSCULAR | Status: DC | PRN
  Administered 2018-12-27: 4 mg via INTRAVENOUS

## 2018-12-27 MED ORDER — VITAMIN B-1 100 MG PO TABS
100.0000 mg | ORAL_TABLET | Freq: Every day | ORAL | Status: DC
  Administered 2018-12-27 – 2018-12-30 (×4): 100 mg via ORAL
  Filled 2018-12-27 (×4): qty 1

## 2018-12-27 MED ORDER — BUPIVACAINE IN DEXTROSE 0.75-8.25 % IT SOLN
INTRATHECAL | Status: DC | PRN
  Administered 2018-12-27: 1.6 mL via INTRATHECAL

## 2018-12-27 MED ORDER — POLYETHYLENE GLYCOL 3350 17 G PO PACK: 17.0000 g | PACK | Freq: Every day | ORAL | Status: DC | PRN

## 2018-12-27 MED ORDER — OXYBUTYNIN CHLORIDE ER 10 MG PO TB24
10.0000 mg | ORAL_TABLET | Freq: Every day | ORAL | Status: DC
  Administered 2018-12-27 – 2018-12-30 (×4): 10 mg via ORAL
  Filled 2018-12-27 (×4): qty 1

## 2018-12-27 MED ORDER — PROPOFOL 10 MG/ML IV BOLUS
INTRAVENOUS | Status: AC
  Filled 2018-12-27: qty 20

## 2018-12-27 MED ORDER — ROPIVACAINE HCL 5 MG/ML IJ SOLN
INTRAMUSCULAR | Status: DC | PRN
  Administered 2018-12-27: 30 mL via PERINEURAL

## 2018-12-27 MED ORDER — DEXAMETHASONE SODIUM PHOSPHATE 10 MG/ML IJ SOLN
INTRAMUSCULAR | Status: DC | PRN
  Administered 2018-12-27: 10 mg via INTRAVENOUS

## 2018-12-27 MED ORDER — METHOCARBAMOL 500 MG IVPB - SIMPLE MED
500.0000 mg | Freq: Four times a day (QID) | INTRAVENOUS | Status: DC | PRN
  Administered 2018-12-27: 500 mg via INTRAVENOUS
  Filled 2018-12-27: qty 50

## 2018-12-27 MED ORDER — ONDANSETRON HCL 4 MG/2ML IJ SOLN: 4.0000 mg | Freq: Four times a day (QID) | INTRAMUSCULAR | Status: DC | PRN

## 2018-12-27 MED ORDER — FENTANYL CITRATE (PF) 100 MCG/2ML IJ SOLN
INTRAMUSCULAR | Status: AC
  Filled 2018-12-27: qty 2

## 2018-12-27 MED ORDER — PANTOPRAZOLE SODIUM 40 MG PO TBEC
40.0000 mg | DELAYED_RELEASE_TABLET | Freq: Every day | ORAL | Status: DC
  Administered 2018-12-27 – 2018-12-30 (×4): 40 mg via ORAL
  Filled 2018-12-27 (×4): qty 1

## 2018-12-27 MED ORDER — FENTANYL CITRATE (PF) 100 MCG/2ML IJ SOLN
25.0000 ug | INTRAMUSCULAR | Status: DC | PRN
  Administered 2018-12-27 (×2): 50 ug via INTRAVENOUS

## 2018-12-27 MED ORDER — PROPOFOL 500 MG/50ML IV EMUL
INTRAVENOUS | Status: DC | PRN
  Administered 2018-12-27: 75 ug/kg/min via INTRAVENOUS

## 2018-12-27 MED ORDER — 0.9 % SODIUM CHLORIDE (POUR BTL) OPTIME
TOPICAL | Status: DC | PRN
  Administered 2018-12-27: 09:00:00 1000 mL

## 2018-12-27 MED ORDER — VITAMIN C 500 MG PO TABS
500.0000 mg | ORAL_TABLET | Freq: Every day | ORAL | Status: DC
  Administered 2018-12-27 – 2018-12-30 (×4): 500 mg via ORAL
  Filled 2018-12-27 (×4): qty 1

## 2018-12-27 MED ORDER — METOPROLOL SUCCINATE ER 25 MG PO TB24
12.5000 mg | ORAL_TABLET | ORAL | Status: DC
  Administered 2018-12-28 – 2018-12-30 (×3): 12.5 mg via ORAL
  Filled 2018-12-27 (×3): qty 1

## 2018-12-27 MED ORDER — PROPOFOL 10 MG/ML IV BOLUS
INTRAVENOUS | Status: AC
  Filled 2018-12-27: qty 40

## 2018-12-27 MED ORDER — FUROSEMIDE 20 MG PO TABS
20.0000 mg | ORAL_TABLET | Freq: Every day | ORAL | Status: DC
  Administered 2018-12-27 – 2018-12-30 (×4): 20 mg via ORAL
  Filled 2018-12-27 (×4): qty 1

## 2018-12-27 MED ORDER — FENTANYL CITRATE (PF) 100 MCG/2ML IJ SOLN
INTRAMUSCULAR | Status: DC | PRN
  Administered 2018-12-27 (×2): 50 ug via INTRAVENOUS

## 2018-12-27 MED ORDER — ALUM & MAG HYDROXIDE-SIMETH 200-200-20 MG/5ML PO SUSP: 30.0000 mL | ORAL | Status: DC | PRN

## 2018-12-27 MED ORDER — PROPOFOL 500 MG/50ML IV EMUL
INTRAVENOUS | Status: DC | PRN
  Administered 2018-12-27: 20 mg via INTRAVENOUS

## 2018-12-27 MED ORDER — BENFOTIAMINE 150 MG PO CAPS: 150.0000 mg | ORAL_CAPSULE | Freq: Every day | ORAL | Status: DC

## 2018-12-27 MED ORDER — METHOCARBAMOL 500 MG IVPB - SIMPLE MED
INTRAVENOUS | Status: AC
  Filled 2018-12-27: qty 50

## 2018-12-27 MED ORDER — METOCLOPRAMIDE HCL 5 MG/ML IJ SOLN: 5.0000 mg | Freq: Three times a day (TID) | INTRAMUSCULAR | Status: DC | PRN

## 2018-12-27 MED ORDER — HYDROMORPHONE HCL 1 MG/ML IJ SOLN
0.5000 mg | INTRAMUSCULAR | Status: DC | PRN
  Administered 2018-12-27: 0.5 mg via INTRAVENOUS
  Administered 2018-12-28: 1 mg via INTRAVENOUS
  Filled 2018-12-27 (×2): qty 1

## 2018-12-27 MED ORDER — STERILE WATER FOR IRRIGATION IR SOLN
Status: DC | PRN
  Administered 2018-12-27: 2000 mL

## 2018-12-27 MED ORDER — CHLORHEXIDINE GLUCONATE 4 % EX LIQD: 60.0000 mL | Freq: Once | CUTANEOUS | Status: DC

## 2018-12-27 MED ORDER — TRANEXAMIC ACID-NACL 1000-0.7 MG/100ML-% IV SOLN
1000.0000 mg | INTRAVENOUS | Status: AC
  Administered 2018-12-27: 1000 mg via INTRAVENOUS
  Filled 2018-12-27: qty 100

## 2018-12-27 MED ORDER — METOCLOPRAMIDE HCL 5 MG PO TABS: 5.0000 mg | ORAL_TABLET | Freq: Three times a day (TID) | ORAL | Status: DC | PRN

## 2018-12-27 MED ORDER — SODIUM CHLORIDE 0.9 % IR SOLN
Status: DC | PRN
  Administered 2018-12-27: 1000 mL

## 2018-12-27 MED ORDER — GABAPENTIN 100 MG PO CAPS
100.0000 mg | ORAL_CAPSULE | Freq: Three times a day (TID) | ORAL | Status: DC
  Administered 2018-12-27 – 2018-12-30 (×9): 100 mg via ORAL
  Filled 2018-12-27 (×9): qty 1

## 2018-12-27 MED ORDER — LACTATED RINGERS IV SOLN
INTRAVENOUS | Status: DC
  Administered 2018-12-27 (×2): via INTRAVENOUS

## 2018-12-27 MED ORDER — POTASSIUM GLUCONATE 595 (99 K) MG PO TABS
595.0000 mg | ORAL_TABLET | Freq: Every day | ORAL | Status: DC
  Administered 2018-12-28 – 2018-12-30 (×3): 595 mg via ORAL
  Filled 2018-12-27 (×3): qty 1

## 2018-12-27 MED ORDER — CEFAZOLIN SODIUM-DEXTROSE 2-4 GM/100ML-% IV SOLN
2.0000 g | INTRAVENOUS | Status: AC
  Administered 2018-12-27: 2 g via INTRAVENOUS
  Filled 2018-12-27: qty 100

## 2018-12-27 MED ORDER — OXYCODONE HCL 5 MG PO TABS
10.0000 mg | ORAL_TABLET | ORAL | Status: DC | PRN
  Administered 2018-12-28: 10 mg via ORAL
  Administered 2018-12-28: 15 mg via ORAL
  Administered 2018-12-28 – 2018-12-29 (×2): 10 mg via ORAL
  Administered 2018-12-29: 15 mg via ORAL
  Administered 2018-12-29: 10 mg via ORAL
  Filled 2018-12-27: qty 3
  Filled 2018-12-27: qty 2
  Filled 2018-12-27: qty 3

## 2018-12-27 MED ORDER — BUPIVACAINE HCL (PF) 0.25 % IJ SOLN
INTRAMUSCULAR | Status: AC
  Filled 2018-12-27: qty 30

## 2018-12-27 MED ORDER — ONDANSETRON HCL 4 MG/2ML IJ SOLN: 4.0000 mg | Freq: Once | INTRAMUSCULAR | Status: DC | PRN

## 2018-12-27 MED ORDER — PHENOL 1.4 % MT LIQD: 1.0000 | OROMUCOSAL | Status: DC | PRN

## 2018-12-27 MED ORDER — CEFAZOLIN SODIUM-DEXTROSE 1-4 GM/50ML-% IV SOLN
1.0000 g | Freq: Four times a day (QID) | INTRAVENOUS | Status: AC
  Administered 2018-12-27 (×2): 1 g via INTRAVENOUS
  Filled 2018-12-27 (×2): qty 50

## 2018-12-27 MED ORDER — ASPIRIN EC 325 MG PO TBEC
325.0000 mg | DELAYED_RELEASE_TABLET | Freq: Two times a day (BID) | ORAL | Status: DC
  Administered 2018-12-27 – 2018-12-30 (×6): 325 mg via ORAL
  Filled 2018-12-27 (×6): qty 1

## 2018-12-27 MED ORDER — ACETAMINOPHEN 500 MG PO TABS
1000.0000 mg | ORAL_TABLET | Freq: Once | ORAL | Status: AC
  Administered 2018-12-27: 08:00:00 1000 mg via ORAL

## 2018-12-27 MED ORDER — VITAMIN D3 25 MCG (1000 UNIT) PO TABS
2000.0000 [IU] | ORAL_TABLET | Freq: Every day | ORAL | Status: DC
  Administered 2018-12-27 – 2018-12-30 (×4): 2000 [IU] via ORAL
  Filled 2018-12-27 (×8): qty 2

## 2018-12-27 MED ORDER — ONDANSETRON HCL 4 MG PO TABS: 4.0000 mg | ORAL_TABLET | Freq: Four times a day (QID) | ORAL | Status: DC | PRN

## 2018-12-27 MED ORDER — DILTIAZEM HCL 30 MG PO TABS
30.0000 mg | ORAL_TABLET | Freq: Four times a day (QID) | ORAL | Status: DC | PRN
  Filled 2018-12-27: qty 1

## 2018-12-27 MED ORDER — FLUOXETINE HCL 20 MG PO CAPS
20.0000 mg | ORAL_CAPSULE | Freq: Every day | ORAL | Status: DC
  Administered 2018-12-28 – 2018-12-30 (×3): 20 mg via ORAL
  Filled 2018-12-27 (×3): qty 1

## 2018-12-27 MED ORDER — ACETAMINOPHEN 500 MG PO TABS
ORAL_TABLET | ORAL | Status: AC
  Administered 2018-12-27: 1000 mg via ORAL
  Filled 2018-12-27: qty 2

## 2018-12-27 MED ORDER — FENTANYL CITRATE (PF) 100 MCG/2ML IJ SOLN
50.0000 ug | INTRAMUSCULAR | Status: AC
  Administered 2018-12-27: 50 ug via INTRAVENOUS
  Filled 2018-12-27: qty 2

## 2018-12-27 MED ORDER — DIPHENHYDRAMINE HCL 12.5 MG/5ML PO ELIX: 12.5000 mg | ORAL_SOLUTION | ORAL | Status: DC | PRN

## 2018-12-27 MED ORDER — DOCUSATE SODIUM 100 MG PO CAPS
100.0000 mg | ORAL_CAPSULE | Freq: Two times a day (BID) | ORAL | Status: DC
  Administered 2018-12-27 – 2018-12-30 (×6): 100 mg via ORAL
  Filled 2018-12-27 (×6): qty 1

## 2018-12-27 MED ORDER — MENTHOL 3 MG MT LOZG: 1.0000 | LOZENGE | OROMUCOSAL | Status: DC | PRN

## 2018-12-27 MED ORDER — SODIUM CHLORIDE 0.9 % IV SOLN
INTRAVENOUS | Status: DC
  Administered 2018-12-27: 75 mL/h via INTRAVENOUS

## 2018-12-27 MED ORDER — POVIDONE-IODINE 10 % EX SWAB
2.0000 "application " | Freq: Once | CUTANEOUS | Status: AC
  Administered 2018-12-27: 2 via TOPICAL

## 2018-12-27 MED ORDER — CLONAZEPAM 0.5 MG PO TABS
1.5000 mg | ORAL_TABLET | Freq: Every day | ORAL | Status: DC
  Administered 2018-12-28 – 2018-12-30 (×3): 1.5 mg via ORAL
  Filled 2018-12-27 (×4): qty 3

## 2018-12-27 MED ORDER — ACETAMINOPHEN 325 MG PO TABS
325.0000 mg | ORAL_TABLET | Freq: Four times a day (QID) | ORAL | Status: DC | PRN
  Administered 2018-12-27: 650 mg via ORAL
  Filled 2018-12-27: qty 2

## 2018-12-27 MED ORDER — POTASSIUM 99 MG PO TABS: 99.0000 mg | ORAL_TABLET | Freq: Every day | ORAL | Status: DC

## 2018-12-27 SURGICAL SUPPLY — 55 items
APL SKNCLS STERI-STRIP NONHPOA (GAUZE/BANDAGES/DRESSINGS)
BAG SPEC THK2 15X12 ZIP CLS (MISCELLANEOUS)
BAG ZIPLOCK 12X15 (MISCELLANEOUS) IMPLANT
BASEPLATE TIBIAL TRIATHALON 3 (Plate) ×2 IMPLANT
BEARIN INSERT TIBIAL SZ 3 11 (Insert) ×3 IMPLANT
BEARING INSERT TIBIAL SZ 3 11 (Insert) IMPLANT
BENZOIN TINCTURE PRP APPL 2/3 (GAUZE/BANDAGES/DRESSINGS) IMPLANT
BLADE SAG 18X100X1.27 (BLADE) ×2 IMPLANT
BLADE SURG SZ10 CARB STEEL (BLADE) ×6 IMPLANT
BNDG ELASTIC 6X5.8 VLCR STR LF (GAUZE/BANDAGES/DRESSINGS) ×3 IMPLANT
BOWL SMART MIX CTS (DISPOSABLE) ×2 IMPLANT
BSPLAT TIB 3 CMNT PRM STRL KN (Plate) ×1 IMPLANT
CEMENT BONE SIMPLEX SPEEDSET (Cement) ×4 IMPLANT
CLOSURE WOUND 1/2 X4 (GAUZE/BANDAGES/DRESSINGS)
COVER SURGICAL LIGHT HANDLE (MISCELLANEOUS) ×3 IMPLANT
COVER WAND RF STERILE (DRAPES) IMPLANT
CUFF TOURN SGL QUICK 34 (TOURNIQUET CUFF) ×3
CUFF TRNQT CYL 34X4.125X (TOURNIQUET CUFF) ×1 IMPLANT
DECANTER SPIKE VIAL GLASS SM (MISCELLANEOUS) IMPLANT
DRAPE U-SHAPE 47X51 STRL (DRAPES) ×3 IMPLANT
DRSG PAD ABDOMINAL 8X10 ST (GAUZE/BANDAGES/DRESSINGS) ×4 IMPLANT
DURAPREP 26ML APPLICATOR (WOUND CARE) ×3 IMPLANT
ELECT REM PT RETURN 15FT ADLT (MISCELLANEOUS) ×3 IMPLANT
FEMORAL PEG DISTAL FIXATION (Orthopedic Implant) ×2 IMPLANT
FEMORAL POST STABILIZED NO 3 (Orthopedic Implant) ×2 IMPLANT
GAUZE SPONGE 4X4 12PLY STRL (GAUZE/BANDAGES/DRESSINGS) ×3 IMPLANT
GAUZE XEROFORM 1X8 LF (GAUZE/BANDAGES/DRESSINGS) ×2 IMPLANT
GLOVE BIO SURGEON STRL SZ7.5 (GLOVE) ×3 IMPLANT
GLOVE BIOGEL PI IND STRL 8 (GLOVE) ×2 IMPLANT
GLOVE BIOGEL PI INDICATOR 8 (GLOVE) ×4
GLOVE ECLIPSE 8.0 STRL XLNG CF (GLOVE) ×3 IMPLANT
GOWN STRL REUS W/TWL XL LVL3 (GOWN DISPOSABLE) ×6 IMPLANT
HANDPIECE INTERPULSE COAX TIP (DISPOSABLE) ×3
HOLDER FOLEY CATH W/STRAP (MISCELLANEOUS) IMPLANT
IMMOBILIZER KNEE 20 (SOFTGOODS) ×3
IMMOBILIZER KNEE 20 THIGH 36 (SOFTGOODS) ×1 IMPLANT
KIT TURNOVER KIT A (KITS) IMPLANT
PACK TOTAL KNEE CUSTOM (KITS) ×3 IMPLANT
PADDING CAST COTTON 6X4 STRL (CAST SUPPLIES) ×4 IMPLANT
PATELLA 32MMX10MM (Knees) ×2 IMPLANT
PIN FLUTED HEDLESS FIX 3.5X1/8 (PIN) ×2 IMPLANT
PROTECTOR NERVE ULNAR (MISCELLANEOUS) ×3 IMPLANT
SET HNDPC FAN SPRY TIP SCT (DISPOSABLE) ×1 IMPLANT
SET PAD KNEE POSITIONER (MISCELLANEOUS) ×3 IMPLANT
STAPLER VISISTAT 35W (STAPLE) ×2 IMPLANT
STRIP CLOSURE SKIN 1/2X4 (GAUZE/BANDAGES/DRESSINGS) IMPLANT
SUT MNCRL AB 4-0 PS2 18 (SUTURE) IMPLANT
SUT VIC AB 0 CT1 27 (SUTURE) ×3
SUT VIC AB 0 CT1 27XBRD ANTBC (SUTURE) ×1 IMPLANT
SUT VIC AB 1 CT1 36 (SUTURE) ×6 IMPLANT
SUT VIC AB 2-0 CT1 27 (SUTURE) ×6
SUT VIC AB 2-0 CT1 TAPERPNT 27 (SUTURE) ×2 IMPLANT
TRAY FOLEY MTR SLVR 14FR STAT (SET/KITS/TRAYS/PACK) ×2 IMPLANT
WRAP KNEE MAXI GEL POST OP (GAUZE/BANDAGES/DRESSINGS) ×3 IMPLANT
YANKAUER SUCT BULB TIP 10FT TU (MISCELLANEOUS) ×3 IMPLANT

## 2018-12-27 NOTE — Transfer of Care (Signed)
Immediate Anesthesia Transfer of Care Note  Patient: Judy White  Procedure(s) Performed: RIGHT TOTAL KNEE ARTHROPLASTY (Right Knee)  Patient Location: PACU  Anesthesia Type:MAC  Level of Consciousness: awake, alert  and oriented  Airway & Oxygen Therapy: Patient Spontanous Breathing and Patient connected to face mask oxygen  Post-op Assessment: Report given to RN and Post -op Vital signs reviewed and stable  Post vital signs: Reviewed and stable  Last Vitals:  Vitals Value Taken Time  BP 115/54 12/27/18 1046  Temp    Pulse 63 12/27/18 1048  Resp 14 12/27/18 1048  SpO2 100 % 12/27/18 1048  Vitals shown include unvalidated device data.  Last Pain:  Vitals:   12/27/18 0730  TempSrc: Oral      Patients Stated Pain Goal: 3 (84/21/03 1281)  Complications: No apparent anesthesia complications

## 2018-12-27 NOTE — Anesthesia Postprocedure Evaluation (Signed)
Anesthesia Post Note  Patient: Judy White  Procedure(s) Performed: RIGHT TOTAL KNEE ARTHROPLASTY (Right Knee)     Patient location during evaluation: PACU Anesthesia Type: Regional and Spinal Level of consciousness: oriented and awake and alert Pain management: pain level controlled Vital Signs Assessment: post-procedure vital signs reviewed and stable Respiratory status: spontaneous breathing, respiratory function stable and patient connected to nasal cannula oxygen Cardiovascular status: blood pressure returned to baseline and stable Postop Assessment: no headache, no backache, no apparent nausea or vomiting and spinal receding Anesthetic complications: no    Last Vitals:  Vitals:   12/27/18 1340 12/27/18 1444  BP: 99/64 116/64  Pulse: 61 61  Resp: 15 16  Temp: (!) 36.4 C 36.5 C  SpO2: 97% 98%    Last Pain:  Vitals:   12/27/18 1600  TempSrc:   PainSc: 8                  Rowan Blaker P Micky Sheller

## 2018-12-27 NOTE — Care Plan (Signed)
Ortho Bundle Case Management Note  Patient Details  Name: Judy White MRN: [id number] Date of Birth: [birth year]  Call to patient to review Ortho bundle. Patient scheduled for Right total knee arthroplasty with Dr. Ninfa Linden on Friday, 12/27/18 at [facility]. She verbalized her husband would be able to assist after hospital stay at home. Anticipate home health PT. Choice provided. [facility] referral made to liaison. Reviewed all information related to Ortho bundle and aftercare for surgery. Patient verbalized she would like to schedule OPPT with [facility] here at Dr. Trevor Mace office. Reviewed follow up appointment scheduled on 01/09/19. Patient would need FWW and 3 in 1 for home use. Will make referral to DME company and plan for delivery to hospital room prior to discharge.                        DME Arranged:  (Will contact DME company; Needs FWW and 3in1) DME Agency:  (Will contact [place])  [place]:  PT [place]:  [facility] ([facility])  Additional Comments: Please contact me with any questions of if this plan should need to change.  Jamse Arn, RN, BSN, [employer]  [phone] 12/27/2018, 4:34 PM

## 2018-12-27 NOTE — Anesthesia Procedure Notes (Signed)
Spinal  Patient location during procedure: OR Staffing Anesthesiologist: Suzette Battiest, MD Performed: anesthesiologist  Preanesthetic Checklist Completed: patient identified, site marked, surgical consent, pre-op evaluation, timeout performed, IV checked, risks and benefits discussed and monitors and equipment checked Spinal Block Patient position: sitting Prep: DuraPrep Patient monitoring: heart rate, cardiac monitor, continuous pulse ox and blood pressure Approach: right paramedian Location: L4-5 Injection technique: single-shot Needle Needle type: [place]  Needle gauge: 22 G Needle length: 9 cm Assessment Sensory level: T4 Additional Notes Multiple failed attempts at midline. Successful SAB on 3rd attempt at right paramedian approach.

## 2018-12-27 NOTE — Progress Notes (Signed)
AssistedDr. Ellender with right, ultrasound guided, adductor canal block. Side rails up, monitors on throughout procedure. See vital signs in flow sheet. Tolerated Procedure well.  

## 2018-12-27 NOTE — Anesthesia Procedure Notes (Signed)
Anesthesia Regional Block: Adductor canal block   Pre-Anesthetic Checklist: ,, timeout performed, Correct Patient, Correct Site, Correct Laterality, Correct Procedure,, site marked, risks and benefits discussed, Surgical consent,  Pre-op evaluation,  At surgeon's request and post-op pain management  Laterality: Right  Prep: chloraprep       Needles:  Injection technique: Single-shot  Needle Type: Echogenic Stimulator Needle     Needle Length: 9cm  Needle Gauge: 21     Additional Needles:   Procedures:,,,, ultrasound used (permanent image in chart),,,,  Narrative:  Start time: 12/27/2018 8:00 AM End time: 12/27/2018 8:10 AM Injection made incrementally with aspirations every 5 mL.  Performed by: Personally  Anesthesiologist: Murvin Natal, MD  Additional Notes: Functioning IV was confirmed and monitors were applied. A time-out was performed. Hand hygiene and sterile gloves were used. The thigh was placed in a frog-leg position and prepped in a sterile fashion. A 59mm 21ga Arrow echogenic stimulator needle was placed using ultrasound guidance.  Negative aspiration and negative test dose prior to incremental administration of local anesthetic. The patient tolerated the procedure well.

## 2018-12-27 NOTE — Brief Op Note (Signed)
12/27/2018  10:24 AM  PATIENT:  Judy White  78 y.o. female  PRE-OPERATIVE DIAGNOSIS:  osteoarthritis, degenerative joint disease right knee  POST-OPERATIVE DIAGNOSIS:  osteoarthritis, degenerative joint disease right knee  PROCEDURE:  Procedure(s): RIGHT TOTAL KNEE ARTHROPLASTY (Right)  SURGEON:  Surgeon(s) and Role:    Mcarthur Rossetti, MD - Primary  ASSISTANTS: Leila, RNFA   ANESTHESIA:   regional and spinal  EBL:  25 mL   COUNTS:  YES  TOURNIQUET:   Total Tourniquet Time Documented: Thigh (Right) - 53 minutes Total: Thigh (Right) - 53 minutes   DICTATION: .Other Dictation: Dictation Number [id number]  PLAN OF CARE: Admit to inpatient   PATIENT DISPOSITION:  PACU - hemodynamically stable.   Delay start of Pharmacological VTE agent (>24hrs) due to surgical blood loss or risk of bleeding: no

## 2018-12-27 NOTE — Evaluation (Signed)
Physical Therapy Evaluation Patient Details Name: Judy White MRN: [id number] DOB: 1940/08/08 Today's Date: 12/27/2018   History of Present Illness  Pt s/p R TKR and with hx of COPD a-fib ablation, and lumbar fusion  Clinical Impression  Pt s/p R TKR and presents with decreased R LE strength/ROM and post op pain limiting functional mobility.  Pt should progress to dc home with family assist and HHPT follow up.    Follow Up Recommendations Home health PT;Follow surgeon's recommendation for DC plan and follow-up therapies    Equipment Recommendations  Rolling walker with 5" wheels    Recommendations for Other Services       Precautions / Restrictions Precautions Precautions: Knee;Fall Required Braces or Orthoses: Knee Immobilizer - Right Knee Immobilizer - Right: Discontinue once straight leg raise with < 10 degree lag Restrictions Weight Bearing Restrictions: No Other Position/Activity Restrictions: WBAT      Mobility  Bed Mobility Overal bed mobility: Needs Assistance Bed Mobility: Supine to Sit     Supine to sit: HOB elevated;Min assist     General bed mobility comments: cues for sequence and assist to manage R LE  Transfers Overall transfer level: Needs assistance Equipment used: Rolling walker (2 wheeled) Transfers: Sit to/from Stand Sit to Stand: Min assist;Mod assist         General transfer comment: cues for LE management and use of UEs to self assist  Ambulation/Gait Ambulation/Gait assistance: Min assist;Mod assist Gait Distance (Feet): 13 Feet Assistive device: Rolling walker (2 wheeled) Gait Pattern/deviations: Step-to pattern;Decreased step length - right;Decreased step length - left;Shuffle;Trunk flexed;Drifts right/left Gait velocity: decr   General Gait Details: Increased time with cues for sequence, posture and position from [employer]            Wheelchair Mobility    Modified Rankin (Stroke Patients Only)       Balance  Overall balance assessment: Needs assistance Sitting-balance support: Bilateral upper extremity supported;Feet supported Sitting balance-Leahy Scale: Fair     Standing balance support: Bilateral upper extremity supported Standing balance-Leahy Scale: Poor                               Pertinent Vitals/Pain Pain Assessment: 0-10 Pain Score: 5  Pain Location: R knee Pain Descriptors / Indicators: Aching;Sore Pain Intervention(s): Limited activity within patient's tolerance;Monitored during session;Premedicated before session;Ice applied    Home Living Family/patient expects to be discharged to:: Private residence Living Arrangements: Spouse/significant other Available Help at Discharge: Family Type of Home: House Home Access: Stairs to enter Entrance Stairs-Rails: None Entrance Stairs-Number of Steps: 1+1 Home Layout: One level Home Equipment: Shower seat - built in;Walker - 4 wheels      Prior Function Level of Independence: Independent;Independent with assistive device(s)         Comments: Using rollator as needed     Hand Dominance   Dominant Hand: Right    Extremity/Trunk Assessment   Upper Extremity Assessment Upper Extremity Assessment: Overall WFL for tasks assessed    Lower Extremity Assessment Lower Extremity Assessment: RLE deficits/detail       Communication   Communication: HOH  Cognition Arousal/Alertness: Awake/alert Behavior During Therapy: WFL for tasks assessed/performed Overall Cognitive Status: Within Functional Limits for tasks assessed  General Comments      Exercises Total Joint Exercises Ankle Circles/Pumps: AROM;Both;15 reps;Supine   Assessment/Plan    PT Assessment Patient needs continued PT services  PT Problem List Decreased strength;Decreased range of motion;Decreased activity tolerance;Decreased balance;Decreased mobility;Decreased knowledge of use of  DME;Pain       PT Treatment Interventions DME instruction;Gait training;Stair training;Functional mobility training;Therapeutic activities;Therapeutic exercise;Patient/family education    PT Goals (Current goals can be found in the Care Plan section)  Acute Rehab PT Goals Patient Stated Goal: Regain IND PT Goal Formulation: With patient Time For Goal Achievement: 01/10/19 Potential to Achieve Goals: Good    Frequency     Barriers to discharge        Co-evaluation               AM-PAC PT "6 Clicks" Mobility  Outcome Measure Help needed turning from your back to your side while in a flat bed without using bedrails?: A Little Help needed moving from lying on your back to sitting on the side of a flat bed without using bedrails?: A Little Help needed moving to and from a bed to a chair (including a wheelchair)?: A Little Help needed standing up from a chair using your arms (e.g., wheelchair or bedside chair)?: A Lot Help needed to walk in hospital room?: A Lot Help needed climbing 3-5 steps with a railing? : A Lot 6 Click Score: 15    End of Session Equipment Utilized During Treatment: Gait belt;Right knee immobilizer Activity Tolerance: Patient tolerated treatment well;Patient limited by fatigue Patient left: in chair;with call bell/phone within reach;with chair alarm set;with family/visitor present Nurse Communication: Mobility status PT Visit Diagnosis: Difficulty in walking, not elsewhere classified (R26.2)    Time: [id number] PT Time Calculation (min) (ACUTE ONLY): 38 min   Charges:   PT Evaluation $PT Eval Low Complexity: 1 Low PT Treatments $Gait Training: 8-22 mins        Debe Coder PT Acute Rehabilitation Services Pager [phone] Office [phone]   Kelis Plasse 12/27/2018, 5:10 PM

## 2018-12-27 NOTE — Op Note (Signed)
NAME: Judy White, Pierron [id number] ACCOUNT [id number] DATE OF BIRTH:[birth year] FACILITY: [facility] LOCATION: [facility] PHYSICIAN:CHRISTOPHER Kerry Fort, MD  OPERATIVE REPORT  DATE OF PROCEDURE:  12/27/2018  PREOPERATIVE DIAGNOSIS:  Osteoarthritis and degenerative joint disease, right knee.  POSTOPERATIVE DIAGNOSIS:  Osteoarthritis and degenerative joint disease, right knee.  PROCEDURE:  Cemented right total knee arthroplasty.  IMPLANTS:  Stryker triathlon knee with size 3 femur, size 3 tibial tray, 11 mm, fixed-bearing polyethylene insert, size 32 patellar button.  SURGEON:  Lind Guest. Ninfa Linden, MD  ASSISTANTSandy Salaam, RNFA  ANESTHESIA:   1.  Right lower extremity adductor canal block 2.  Spinal.  ANTIBIOTICS:  Two grams IV Ancef.  ESTIMATED BLOOD LOSS:  100 mL.  TOURNIQUET TIME:  Less than 1 hour.  COMPLICATIONS:  None.  INDICATIONS:  The patient is an 78 year old female with debilitating arthritis involving her right knee that is well documented.  She has had numerous attempts at conservative treatment for this knee.  It has worsened over the last five years.  At this  point, her arthritis in that right knee, which is well documented has detrimentally affected her mobility her quality of life and her activities of daily living to the point she does wish to proceed with total knee arthroplasty.  We talked in detail  about the risk of acute blood loss anemia, nerve or vessel injury, fracture, infection, DVT, and implant failure.  She understands our goals are to decrease pain, improve mobility and overall improve quality of life.  DESCRIPTION OF PROCEDURE:  After informed consent was obtained and appropriate right knee was marked, an adductor canal block was obtained in the holding room of her right lower extremity.  She was then brought to the operating room and sat up on the  operating table.  Spinal anesthesia was obtained.  She was laid in supine position on the  operating table.  A Foley catheter was placed and then her right operative knee was prepped and draped with DuraPrep and sterile drapes.  Of note, a nonsterile  tourniquet was placed around her upper right thigh.  A time-out was called to identify correct patient and correct right knee.  We then made an incision over the patella and carried this proximally and distally.  This was after inflating the tourniquet  to 300 mm of pressure and wrapping it with an Esmarch.  We dissected down to the knee joint and carried out a medial parapatellar arthrotomy opening up the joint capsule finding a significant effusion in her knee and synovitis.  There was cartilage loss  in all 3 compartments with periarticular osteophytes as well.  We removed all the osteophytes we could from the knee and with the knee in a flexed position, we removed remnants of the medial and lateral meniscus in the knee and the ACL and PCL.  With the  extramedullary guide for the tibia, we set this cutting for a neutral slope correcting for varus and valgus and taking 9 mm off the high side.  We made this cut without difficulty.  We then used an intramedullary guide for the femur, setting our distal  femoral cut at a 10 mm distal femoral cut.  We made this cut without difficulty.  The guide was set for right knee at 5 degrees externally rotated.  With the knee down to full extension, we used a 9 mm extension block and we felt we had achieved full  extension.  We then went back to the femur and put  our femoral sizing guide based off the epicondylar axis.  Based off this, we chose a size 3 femur.  We put a 4-in-1 cutting block for size 3 femur, made our anterior and posterior cuts, followed by our  chamfer cuts.  We did assess her with a flexion block at 90 degrees of flexion and she was a little bit more loose there, so we felt most likely we would go with a higher thickness insert.  We had the knee in a flexed position after making our femoral   box cut and made our tibia placement based off the tibial tubercle with the rotation off of that and the femur.  We were able to set the rotation off the femur and made our keel cut.  With trial size 3 tibia we trialed a 3 femur and went to 11 mm fixed  bearing polyethylene insert trial and we were pleased with stability and range of motion.  We then made our patellar cut and drilled 3 holes for a size 32 patellar button.  With all trial implants in place I again put the knee through range of motion and  assessed for stability and we were pleased with the alignment overall as well.  We removed all trial instrumentation from the knee and irrigated the knee with normal saline solution using pulsatile lavage.  We dried the knee very well and mixed our  cement.  We then cemented the real Stryker Triathlon tibial tray size 3 followed by cementing the real size 3 femur.  We removed excess cement debris from the knee and placed our 11 mm fixed bearing polyethylene insert and cemented our patellar button.   I held the knee in a fully extended position and compressed the knee while the cement cured.  Once it hardened, we let the tourniquet down.  Hemostasis obtained with electrocautery.  We then irrigated again with normal saline solution using pulsatile  lavage.  We closed the arthrotomy with interrupted #1 Vicryl suture followed by closing the deep tissue was 0 Vicryl, 2-0 Vicryl was used to close the subcutaneous tissue and interrupted staples were used to reapproximate the skin.  Xeroform and  well-padded sterile dressing was applied.  She was taken to recovery room in stable condition.  All final counts were correct.  There were no complications noted.  TN/NUANCE  D:12/27/2018 T:12/27/2018 JOB:[id number]

## 2018-12-28 DIAGNOSIS — Z6830 Body mass index (BMI) 30.0-30.9, adult: Secondary | ICD-10-CM | POA: Diagnosis not present

## 2018-12-28 DIAGNOSIS — J449 Chronic obstructive pulmonary disease, unspecified: Secondary | ICD-10-CM | POA: Diagnosis not present

## 2018-12-28 DIAGNOSIS — G4730 Sleep apnea, unspecified: Secondary | ICD-10-CM | POA: Diagnosis present

## 2018-12-28 DIAGNOSIS — N3281 Overactive bladder: Secondary | ICD-10-CM | POA: Diagnosis present

## 2018-12-28 DIAGNOSIS — F329 Major depressive disorder, single episode, unspecified: Secondary | ICD-10-CM | POA: Diagnosis present

## 2018-12-28 DIAGNOSIS — Z86711 Personal history of pulmonary embolism: Secondary | ICD-10-CM | POA: Diagnosis not present

## 2018-12-28 DIAGNOSIS — M8580 Other specified disorders of bone density and structure, unspecified site: Secondary | ICD-10-CM | POA: Diagnosis present

## 2018-12-28 DIAGNOSIS — Z87891 Personal history of nicotine dependence: Secondary | ICD-10-CM | POA: Diagnosis not present

## 2018-12-28 DIAGNOSIS — Z981 Arthrodesis status: Secondary | ICD-10-CM | POA: Diagnosis not present

## 2018-12-28 DIAGNOSIS — Z7982 Long term (current) use of aspirin: Secondary | ICD-10-CM | POA: Diagnosis not present

## 2018-12-28 DIAGNOSIS — Z8249 Family history of ischemic heart disease and other diseases of the circulatory system: Secondary | ICD-10-CM | POA: Diagnosis not present

## 2018-12-28 DIAGNOSIS — I5032 Chronic diastolic (congestive) heart failure: Secondary | ICD-10-CM | POA: Diagnosis not present

## 2018-12-28 DIAGNOSIS — E669 Obesity, unspecified: Secondary | ICD-10-CM | POA: Diagnosis not present

## 2018-12-28 DIAGNOSIS — Z95818 Presence of other cardiac implants and grafts: Secondary | ICD-10-CM | POA: Diagnosis not present

## 2018-12-28 DIAGNOSIS — F419 Anxiety disorder, unspecified: Secondary | ICD-10-CM | POA: Diagnosis not present

## 2018-12-28 DIAGNOSIS — I110 Hypertensive heart disease with heart failure: Secondary | ICD-10-CM | POA: Diagnosis not present

## 2018-12-28 DIAGNOSIS — M1711 Unilateral primary osteoarthritis, right knee: Secondary | ICD-10-CM | POA: Diagnosis present

## 2018-12-28 LAB — BASIC METABOLIC PANEL
Anion gap: 9 (ref 5–15)
BUN: 19 mg/dL (ref 8–23)
CO2: 27 mmol/L (ref 22–32)
Calcium: 8.3 mg/dL — ABNORMAL LOW (ref 8.9–10.3)
Chloride: 102 mmol/L (ref 98–111)
Creatinine, Ser: 0.79 mg/dL (ref 0.44–1.00)
GFR calc Af Amer: >60 mL/min (ref >60)
GFR calc non Af Amer: >60 mL/min (ref >60)
Glucose, Bld: 162 mg/dL — ABNORMAL HIGH (ref 70–99)
Potassium: 4.8 mmol/L (ref 3.5–5.1)
Sodium: 138 mmol/L (ref 135–145)

## 2018-12-28 LAB — CBC
HCT: 38.9 % (ref 36.0–46.0)
Hemoglobin: 12.3 g/dL (ref 12.0–15.0)
MCH: 29.4 pg (ref 26.0–34.0)
MCHC: 31.6 g/dL (ref 30.0–36.0)
MCV: 92.8 fL (ref 80.0–100.0)
Platelets: 205 10*3/uL (ref 150–400)
RBC: 4.19 MIL/uL (ref 3.87–5.11)
RDW: 13.1 % (ref 11.5–15.5)
WBC: 18.1 10*3/uL — ABNORMAL HIGH (ref 4.0–10.5)
nRBC: 0 % (ref 0.0–0.2)

## 2018-12-28 MED ORDER — OXYCODONE HCL 5 MG PO TABS: 5.0000 mg | ORAL_TABLET | Freq: Four times a day (QID) | ORAL | 0 refills | Status: DC | PRN

## 2018-12-28 NOTE — Progress Notes (Signed)
Physical Therapy Treatment Patient Details Name: Judy White MRN: [id number] DOB: 07/27/1940 Today's Date: 12/28/2018    History of Present Illness Pt s/p R TKR and with hx of COPD a-fib ablation, and lumbar fusion    PT Comments    Pt progressing with mobility but requiring increased time and cues to focus for task completion.   Follow Up Recommendations  Home health PT;Follow surgeon's recommendation for DC plan and follow-up therapies     Equipment Recommendations  Rolling walker with 5" wheels    Recommendations for Other Services       Precautions / Restrictions Precautions Precautions: Knee;Fall Required Braces or Orthoses: Knee Immobilizer - Right Knee Immobilizer - Right: Discontinue once straight leg raise with < 10 degree lag Restrictions Weight Bearing Restrictions: No Other Position/Activity Restrictions: WBAT    Mobility  Bed Mobility Overal bed mobility: Needs Assistance Bed Mobility: Sit to Supine       Sit to supine: Min assist   General bed mobility comments: cues for sequence, use of bedrail,  and assist to manage R LE  Transfers Overall transfer level: Needs assistance Equipment used: Rolling walker (2 wheeled) Transfers: Sit to/from Stand Sit to Stand: Min assist         General transfer comment: cues for LE management and use of UEs to self assist  Ambulation/Gait Ambulation/Gait assistance: Min assist Gait Distance (Feet): 46 Feet Assistive device: Rolling walker (2 wheeled) Gait Pattern/deviations: Step-to pattern;Decreased step length - right;Decreased step length - left;Shuffle;Trunk flexed;Drifts right/left Gait velocity: decr   General Gait Details: Increased time with cues for sequence, posture and position from [employer]             Wheelchair Mobility    Modified Rankin (Stroke Patients Only)       Balance Overall balance assessment: Needs assistance Sitting-balance support: Bilateral upper extremity  supported;Feet supported Sitting balance-Leahy Scale: Fair     Standing balance support: Bilateral upper extremity supported Standing balance-Leahy Scale: Poor                              Cognition Arousal/Alertness: Awake/alert Behavior During Therapy: WFL for tasks assessed/performed Overall Cognitive Status: Within Functional Limits for tasks assessed                                        Exercises      General Comments        Pertinent Vitals/Pain Pain Assessment: 0-10 Pain Score: 5  Pain Location: R knee Pain Descriptors / Indicators: Aching;Sore Pain Intervention(s): Limited activity within patient's tolerance;Monitored during session;Premedicated before session;Ice applied    Home Living                      Prior Function            PT Goals (current goals can now be found in the care plan section) Acute Rehab PT Goals Patient Stated Goal: Regain IND PT Goal Formulation: With patient Time For Goal Achievement: 01/10/19 Potential to Achieve Goals: Good Progress towards PT goals: Progressing toward goals    Frequency    7X/week      PT Plan Current plan remains appropriate    Co-evaluation              AM-PAC PT "6 Clicks" Mobility  Outcome Measure  Help needed turning from your back to your side while in a flat bed without using bedrails?: A Little Help needed moving from lying on your back to sitting on the side of a flat bed without using bedrails?: A Little Help needed moving to and from a bed to a chair (including a wheelchair)?: A Little Help needed standing up from a chair using your arms (e.g., wheelchair or bedside chair)?: A Little Help needed to walk in hospital room?: A Little Help needed climbing 3-5 steps with a railing? : A Lot 6 Click Score: 17    End of Session Equipment Utilized During Treatment: Gait belt;Right knee immobilizer Activity Tolerance: Patient tolerated treatment  well;Patient limited by fatigue Patient left: in chair;with call bell/phone within reach;with chair alarm set;with family/visitor present Nurse Communication: Mobility status PT Visit Diagnosis: Difficulty in walking, not elsewhere classified (R26.2)     Time: 1420-1450 PT Time Calculation (min) (ACUTE ONLY): 30 min  Charges:  $Gait Training: 23-37 mins                     [place] Pager [phone] Office [phone]    Jakari Jacot 12/28/2018, 4:34 PM

## 2018-12-28 NOTE — Plan of Care (Signed)
  Problem: Education: Goal: Knowledge of General Education information will improve Description: Including pain rating scale, medication(s)/side effects and non-pharmacologic comfort measures Outcome: Progressing   Problem: Clinical Measurements: Goal: Will remain free from infection Outcome: Progressing   Problem: Clinical Measurements: Goal: Cardiovascular complication will be avoided Outcome: Progressing   Problem: Activity: Goal: Risk for activity intolerance will decrease Outcome: Progressing

## 2018-12-28 NOTE — Progress Notes (Signed)
    Home health agencies that serve [phone].        [place] Quality of Patient Care Rating Patient Survey Summary Rating  [facility] [phone] 2  out of 5 stars 4 out of [place] [phone] 4  out of 5 stars 4 out of [place] [phone] 4  out of 5 stars 5 out of [place] [phone] 4 out of 5 stars 4 out of [place] [phone] 4 out of 5 stars 4 out of 5 stars  [facility] [place] [phone] 3  out of 5 stars 4 out of [place] [phone] 3 out of 5 stars 4 out of 5 stars  [facility] [phone] 4  out of 5 stars 3 out of [place] [phone] 3  out of 5 stars 4 out of [place] [phone] 3  out of 5 stars 4 out of [place] [phone] 4  out of 5 stars 4 out of [place] number Footnote as displayed on [place]  1 This agency provides services under a federal waiver program to non-traditional, chronic long term population.  2 This agency provides services to a special needs population.  3 Not Available.  4 The number of patient episodes for this measure is too small to report.  5 This measure currently does not have data or provider has been certified/recertified for less than 6 months.  6 The national average for this measure is not provided because of state-to-state differences in data collection.  7 Medicare is not displaying rates for this measure for any home health agency, because of an issue with the data.  8 There were problems with the data and they are being corrected.  9 Zero, or very few, patients met the survey's rules for inclusion. The  scores shown, if any, reflect a very small number of surveys and may not accurately tell how an agency is doing.  10 Survey results are based on less than 12 months of data.  11 Fewer than 70 patients completed the survey. Use the scores shown, if any, with caution as the number of surveys may be too low to accurately tell how an agency is doing.  12 No survey results are available for this period.  13 Data suppressed by CMS for one or more quarters.

## 2018-12-28 NOTE — Progress Notes (Signed)
Subjective: 1 Day Post-Op Procedure(s) (LRB): RIGHT TOTAL KNEE ARTHROPLASTY (Right) Patient reports pain as moderate.    Objective: Vital signs in last 24 hours: Temp:  [97.5 F (36.4 C)-98.4 F (36.9 C)] 97.9 F (36.6 C) (10/31 0417) Pulse Rate:  [61-74] 74 (10/31 1007) Resp:  [11-18] 18 (10/31 0417) BP: (99-127)/(59-85) 114/68 (10/31 1007) SpO2:  [90 %-100 %] 100 % (10/31 0417) Weight:  [86.3 kg] 86.3 kg (10/30 1340)  Intake/Output from previous day: 10/30 0701 - 10/31 0700 In: 2013.1 [P.O.:240; I.V.:1673.1; IV Piggyback:100] Out: 1875 [Urine:1850; Blood:25] Intake/Output this shift: Total I/O In: 250 [P.O.:250] Out: -   Recent Labs    12/28/18 0315  HGB 12.3   Recent Labs    12/28/18 0315  WBC 18.1*  RBC 4.19  HCT 38.9  PLT 205   Recent Labs    12/28/18 0315  NA 138  K 4.8  CL 102  CO2 27  BUN 19  CREATININE 0.79  GLUCOSE 162*  CALCIUM 8.3*   No results for input(s): LABPT, INR in the last 72 hours.  Intact pulses distally Dorsiflexion/Plantar flexion intact Incision: scant drainage No cellulitis present Compartment soft   Assessment/Plan: 1 Day Post-Op Procedure(s) (LRB): RIGHT TOTAL KNEE ARTHROPLASTY (Right) Up with therapy Discharge home with home health next 1-2 days  Not safe to go home today since power is out at her home, which is not good given her age and how cold it currently is outside.      Mcarthur Rossetti 12/28/2018, 12:17 PM

## 2018-12-28 NOTE — Progress Notes (Signed)
Physical Therapy Treatment Patient Details Name: Judy White MRN: [id number] DOB: [birth year] Today's Date: 12/28/2018    History of Present Illness Pt s/p R TKR and with hx of COPD a-fib ablation, and lumbar fusion    PT Comments    Pt cooperative but progressing slowly - limited by pain/fatigue.   Follow Up Recommendations  Home health PT;Follow surgeon's recommendation for DC plan and follow-up therapies     Equipment Recommendations  Rolling walker with 5" wheels    Recommendations for Other Services       Precautions / Restrictions Precautions Precautions: Knee;Fall Required Braces or Orthoses: Knee Immobilizer - Right Knee Immobilizer - Right: Discontinue once straight leg raise with < 10 degree lag Restrictions Weight Bearing Restrictions: No Other Position/Activity Restrictions: WBAT    Mobility  Bed Mobility Overal bed mobility: Needs Assistance Bed Mobility: Supine to Sit     Supine to sit: HOB elevated;Min assist     General bed mobility comments: cues for sequence, use of bedrail,  and assist to manage R LE  Transfers Overall transfer level: Needs assistance Equipment used: Rolling walker (2 wheeled) Transfers: Sit to/from Stand Sit to Stand: Min assist;Mod assist         General transfer comment: cues for LE management and use of UEs to self assist  Ambulation/Gait Ambulation/Gait assistance: Min assist;Mod assist Gait Distance (Feet): 32 Feet Assistive device: Rolling walker (2 wheeled) Gait Pattern/deviations: Step-to pattern;Decreased step length - right;Decreased step length - left;Shuffle;Trunk flexed;Drifts right/left Gait velocity: decr   General Gait Details: Increased time with cues for sequence, posture and position from [employer]             Wheelchair Mobility    Modified Rankin (Stroke Patients Only)       Balance Overall balance assessment: Needs assistance Sitting-balance support: Bilateral upper extremity  supported;Feet supported Sitting balance-Leahy Scale: Fair     Standing balance support: Bilateral upper extremity supported Standing balance-Leahy Scale: Poor                              Cognition Arousal/Alertness: Awake/alert Behavior During Therapy: WFL for tasks assessed/performed Overall Cognitive Status: Within Functional Limits for tasks assessed                                        Exercises Total Joint Exercises Ankle Circles/Pumps: AROM;Both;15 reps;Supine Quad Sets: AROM;Both;10 reps;Supine Heel Slides: AAROM;Right;15 reps;Supine Straight Leg Raises: AAROM;Right;10 reps;Supine Goniometric ROM: AAROM R knee -8 - 35    General Comments        Pertinent Vitals/Pain Pain Assessment: 0-10 Pain Score: 6  Pain Location: R knee Pain Descriptors / Indicators: Aching;Sore Pain Intervention(s): Limited activity within patient's tolerance;Monitored during session;Premedicated before session;Ice applied    Home Living                      Prior Function            PT Goals (current goals can now be found in the care plan section) Acute Rehab PT Goals Patient Stated Goal: Regain IND PT Goal Formulation: With patient Time For Goal Achievement: 01/10/19 Potential to Achieve Goals: Good Progress towards PT goals: Progressing toward goals    Frequency    7X/week      PT Plan Current plan remains  appropriate    Co-evaluation              AM-PAC PT "6 Clicks" Mobility   Outcome Measure  Help needed turning from your back to your side while in a flat bed without using bedrails?: A Little Help needed moving from lying on your back to sitting on the side of a flat bed without using bedrails?: A Little Help needed moving to and from a bed to a chair (including a wheelchair)?: A Little Help needed standing up from a chair using your arms (e.g., wheelchair or bedside chair)?: A Lot Help needed to walk in hospital  room?: A Lot Help needed climbing 3-5 steps with a railing? : A Lot 6 Click Score: 15    End of Session Equipment Utilized During Treatment: Gait belt;Right knee immobilizer Activity Tolerance: Patient tolerated treatment well;Patient limited by fatigue Patient left: in chair;with call bell/phone within reach;with chair alarm set;with family/visitor present Nurse Communication: Mobility status PT Visit Diagnosis: Difficulty in walking, not elsewhere classified (R26.2)     Time: [id number] PT Time Calculation (min) (ACUTE ONLY): 38 min  Charges:  $Gait Training: 8-22 mins $Therapeutic Exercise: 8-22 mins $Therapeutic Activity: 8-22 mins                     Debe Coder PT Acute Rehabilitation Services Pager [phone] Office [phone]    Sameena Artus 12/28/2018, 11:17 AM

## 2018-12-28 NOTE — Discharge Instructions (Signed)

## 2018-12-28 NOTE — TOC Initial Note (Signed)
Transition of Care [facility]) - Initial/Assessment Note    Patient Details  Name: Judy White MRN: [id number] Date of Birth: June 17, 1940  Transition [facility]) CM/SW Contact:    Joaquin Courts, RN Phone Number: 12/28/2018, 11:59 AM  Clinical Narrative:                 CM spoke with patient at bedside. Patient set up with [facility] at home for [place]. [employer] to deliver rolling walker and 3-in-1 to bedside for home use.  Expected Discharge Plan: [place] Barriers to Discharge: Continued Medical Work up   Patient Goals and CMS Choice Patient states their goals for this hospitalization and ongoing recovery are:: to go home with therapy CMS Medicare.gov Compare Post Acute Care list provided to:: Patient Choice offered to / list presented to : Patient  Expected Discharge Plan and Services Expected Discharge Plan: [place]   Discharge Planning Services: CM Consult Post Acute Care Choice: [place] arrangements for the past 2 months: Single Family Home                 DME Arranged: 3-N-1, Walker rolling DME Agency: [employer] Date DME Agency Contacted: 12/28/18 Time DME Agency Contacted: 93 Representative spoke with at DME Agency: [place]: PT [place]: [facility] [employer]     Representative spoke with at [place]: pre arranged in MD office  Prior Living Arrangements/Services Living arrangements for the past 2 months: [place] with:: Spouse Patient language and need for interpreter reviewed:: Yes Do you feel safe going back to the place where you live?: Yes      Need for Family Participation in Patient Care: Yes (Comment) Care giver support system in place?: Yes (comment)   Criminal Activity/Legal Involvement Pertinent to Current Situation/Hospitalization: No - Comment as needed  Activities of Daily Living Home Assistive Devices/Equipment: Eyeglasses ADL Screening  (condition at time of admission) Patient's cognitive ability adequate to safely complete daily activities?: Yes Is the patient deaf or have difficulty hearing?: No Does the patient have difficulty seeing, even when wearing glasses/contacts?: No Does the patient have difficulty concentrating, remembering, or making decisions?: No Patient able to express need for assistance with ADLs?: Yes Does the patient have difficulty dressing or bathing?: No Independently performs ADLs?: Yes (appropriate for developmental age) Does the patient have difficulty walking or climbing stairs?: Yes Weakness of Legs: Right Weakness of Arms/Hands: Right  Permission Sought/Granted   Permission granted to share information with : Yes, Verbal Permission Granted     Permission granted to share info w AGENCY: [facility]        Emotional Assessment Appearance:: Appears stated age Attitude/Demeanor/Rapport: Engaged Affect (typically observed): Accepting Orientation: : Oriented to Self, Oriented to Place, Oriented to  Time, Oriented to Situation   Psych Involvement: No (comment)  Admission diagnosis:  osteoarthritis, degenerative joint disease right knee Patient Active Problem List   Diagnosis Date Noted  . Status post total right knee replacement 12/27/2018  . Unilateral primary osteoarthritis, right knee 11/27/2018  . Rib fracture 04/22/2018  . Pneumothorax on left 01/24/2016  . Abnormal CT of the chest   . Pulmonary embolism ([place])   . Interstitial lung disease ([place]) 10/08/2015  . Paroxysmal atrial fibrillation (HCC)   . Typical atrial flutter ([place])   . Chronic diastolic heart failure ([place]) 08/25/2014  . Lumbar stenosis 08/11/2014  . On amiodarone therapy 05/21/2013  . Hypercholesteremia   .  Essential hypertension   . Myalgia and myositis, unspecified   . COPD GOLD 0    . Overactive bladder    PCP:  Josetta Huddle, MD Pharmacy:   [facility], [place] [address] Phone: [phone] Fax: [phone]     Social Determinants of Health (SDOH) Interventions    Readmission Risk Interventions No flowsheet data found.

## 2018-12-28 NOTE — Plan of Care (Signed)

## 2018-12-29 NOTE — Progress Notes (Signed)
Subjective: 2 Days Post-Op Procedure(s) (LRB): RIGHT TOTAL KNEE ARTHROPLASTY (Right) Patient reports pain as moderate.    Objective: Vital signs in last 24 hours: Temp:  [97.6 F (36.4 C)-98.6 F (37 C)] 98.1 F (36.7 C) (11/01 0453) Pulse Rate:  [74-84] 79 (11/01 0453) Resp:  [15-16] 16 (11/01 0453) BP: (114-130)/(62-68) 126/65 (11/01 0453) SpO2:  [85 %-93 %] 93 % (11/01 0453)  Intake/Output from previous day: 10/31 0701 - 11/01 0700 In: 300 [P.O.:300] Out: 250 [Urine:250] Intake/Output this shift: No intake/output data recorded.  Recent Labs    12/28/18 0315  HGB 12.3   Recent Labs    12/28/18 0315  WBC 18.1*  RBC 4.19  HCT 38.9  PLT 205   Recent Labs    12/28/18 0315  NA 138  K 4.8  CL 102  CO2 27  BUN 19  CREATININE 0.79  GLUCOSE 162*  CALCIUM 8.3*   No results for input(s): LABPT, INR in the last 72 hours.  Neurologically intact Neurovascular intact Sensation intact distally Intact pulses distally Dorsiflexion/Plantar flexion intact Incision: scant drainage No cellulitis present Compartment soft   Assessment/Plan: 2 Days Post-Op Procedure(s) (LRB): RIGHT TOTAL KNEE ARTHROPLASTY (Right) Up with therapy Plan for discharge tomorrow WBAT RLE Dry dressing change prn  Anticipated LOS equal to or greater than 2 midnights due to - Age 80 and older with one or more of the following:  - Obesity  - Expected need for hospital services (PT, OT, Nursing) required for safe  discharge  - Anticipated need for postoperative skilled nursing care or inpatient rehab  - Active co-morbidities: Heart Failure and Respiratory Failure/COPD OR   - Unanticipated findings during/Post Surgery: Slow post-op progression: GI, pain control, mobility  - Patient is a high risk of re-admission due to: None    Judy White 12/29/2018, 8:38 AM

## 2018-12-29 NOTE — Progress Notes (Signed)
Physical Therapy Treatment Patient Details Name: Judy White MRN: [id number] DOB: [birth year] Today's Date: 12/29/2018    History of Present Illness Pt s/p R TKR and with hx of COPD a-fib ablation, and lumbar fusion    PT Comments    Follow up after pain meds for therex program.   Follow Up Recommendations  Home health PT;Follow surgeon's recommendation for DC plan and follow-up therapies     Equipment Recommendations  Rolling walker with 5" wheels    Recommendations for Other Services       Precautions / Restrictions Precautions Precautions: Knee;Fall Required Braces or Orthoses: Knee Immobilizer - Right Knee Immobilizer - Right: Discontinue once straight leg raise with < 10 degree lag Restrictions Weight Bearing Restrictions: No Other Position/Activity Restrictions: WBAT    Mobility  Bed Mobility                  Transfers                    Ambulation/Gait                 Stairs             Wheelchair Mobility    Modified Rankin (Stroke Patients Only)       Balance                                            Cognition Arousal/Alertness: Awake/alert Behavior During Therapy: WFL for tasks assessed/performed Overall Cognitive Status: Within Functional Limits for tasks assessed                                        Exercises Total Joint Exercises Ankle Circles/Pumps: AROM;Both;15 reps;Supine Quad Sets: AROM;Both;Supine;15 reps Heel Slides: AAROM;Right;15 reps;Supine Straight Leg Raises: AAROM;Right;Supine;20 reps Goniometric ROM: AAROM R knee -5 - 40    General Comments        Pertinent Vitals/Pain Pain Assessment: 0-10 Pain Score: 5  Pain Location: R knee Pain Descriptors / Indicators: Aching;Sore Pain Intervention(s): Limited activity within patient's tolerance;Monitored during session;Premedicated before session;Ice applied    Home Living                      Prior  Function            PT Goals (current goals can now be found in the care plan section) Acute Rehab PT Goals Patient Stated Goal: Regain IND PT Goal Formulation: With patient Time For Goal Achievement: 01/10/19 Potential to Achieve Goals: Good Progress towards PT goals: Progressing toward goals    Frequency    7X/week      PT Plan Current plan remains appropriate    Co-evaluation              AM-PAC PT "6 Clicks" Mobility   Outcome Measure  Help needed turning from your back to your side while in a flat bed without using bedrails?: A Little Help needed moving from lying on your back to sitting on the side of a flat bed without using bedrails?: A Little Help needed moving to and from a bed to a chair (including a wheelchair)?: A Little Help needed standing up from a chair using your arms (e.g., wheelchair or bedside chair)?: A Little Help  needed to walk in hospital room?: A Little Help needed climbing 3-5 steps with a railing? : A Lot 6 Click Score: 17    End of Session   Activity Tolerance: Patient tolerated treatment well Patient left: in chair;with call bell/phone within reach;with chair alarm set Nurse Communication: Mobility status PT Visit Diagnosis: Difficulty in walking, not elsewhere classified (R26.2)     Time: [id number] PT Time Calculation (min) (ACUTE ONLY): 22 min  Charges:  $Therapeutic Exercise: 8-22 mins                     [place] Pager [phone] Office [phone]    Tishawn Friedhoff 12/29/2018, 1:11 PM

## 2018-12-29 NOTE — Progress Notes (Addendum)
Physical Therapy Treatment Patient Details Name: Judy White MRN: [id number] DOB: 02/28/1940 Today's Date: 12/29/2018    History of Present Illness Pt s/p R TKR and with hx of COPD a-fib ablation, and lumbar fusion    PT Comments    Pt very cooperative and with noted improvement in activity tolerance and ambulatory balance.   Follow Up Recommendations  Home health PT;Follow surgeon's recommendation for DC plan and follow-up therapies     Equipment Recommendations  Rolling walker with 5" wheels    Recommendations for Other Services       Precautions / Restrictions Precautions Precautions: Knee;Fall Required Braces or Orthoses: Knee Immobilizer - Right Knee Immobilizer - Right: Discontinue once straight leg raise with < 10 degree lag Restrictions Weight Bearing Restrictions: No Other Position/Activity Restrictions: WBAT    Mobility  Bed Mobility Overal bed mobility: Needs Assistance Bed Mobility: Supine to Sit     Supine to sit: HOB elevated;Min assist     General bed mobility comments: cues for sequence, use of bedrail,  and assist to manage R LE  Transfers Overall transfer level: Needs assistance Equipment used: Rolling walker (2 wheeled) Transfers: Sit to/from Stand Sit to Stand: Min assist Stand pivot transfers: Min assist       General transfer comment: cues for LE management and use of UEs to self assist; stand/pvt bed to [name]  Ambulation/Gait Ambulation/Gait assistance: Min assist;Min guard Gait Distance (Feet): 100 Feet Assistive device: Rolling walker (2 wheeled) Gait Pattern/deviations: Step-to pattern;Decreased step length - right;Decreased step length - left;Shuffle;Trunk flexed;Drifts right/left Gait velocity: decr   General Gait Details: Increased time with cues for sequence, posture and position from [employer]             Wheelchair Mobility    Modified Rankin (Stroke Patients Only)       Balance Overall balance assessment:  Needs assistance Sitting-balance support: Bilateral upper extremity supported;Feet supported Sitting balance-Leahy Scale: Good     Standing balance support: Bilateral upper extremity supported Standing balance-Leahy Scale: Poor                              Cognition Arousal/Alertness: Awake/alert Behavior During Therapy: WFL for tasks assessed/performed Overall Cognitive Status: Within Functional Limits for tasks assessed                                        Exercises      General Comments        Pertinent Vitals/Pain Pain Assessment: 0-10 Pain Score: 5  Pain Location: R knee Pain Descriptors / Indicators: Aching;Sore Pain Intervention(s): Limited activity within patient's tolerance;Monitored during session;Premedicated before session;Ice applied    Home Living                      Prior Function            PT Goals (current goals can now be found in the care plan section) Acute Rehab PT Goals Patient Stated Goal: Regain IND PT Goal Formulation: With patient Time For Goal Achievement: 01/10/19 Potential to Achieve Goals: Good Progress towards PT goals: Progressing toward goals    Frequency    7X/week      PT Plan Current plan remains appropriate    Co-evaluation  AM-PAC PT "6 Clicks" Mobility   Outcome Measure  Help needed turning from your back to your side while in a flat bed without using bedrails?: A Little Help needed moving from lying on your back to sitting on the side of a flat bed without using bedrails?: A Little Help needed moving to and from a bed to a chair (including a wheelchair)?: A Little Help needed standing up from a chair using your arms (e.g., wheelchair or bedside chair)?: A Little Help needed to walk in hospital room?: A Little Help needed climbing 3-5 steps with a railing? : A Lot 6 Click Score: 17    End of Session Equipment Utilized During Treatment: Gait belt;Right  knee immobilizer Activity Tolerance: Patient tolerated treatment well;Patient limited by fatigue Patient left: in chair;with call bell/phone within reach;with chair alarm set Nurse Communication: Mobility status PT Visit Diagnosis: Difficulty in walking, not elsewhere classified (R26.2)     Time: 0823-0900 PT Time Calculation (min) (ACUTE ONLY): 37 min  Charges:  $Gait Training: 23-37 mins                     [place] Pager [phone] Office [phone]    Wakisha Alberts 12/29/2018, 1:07 PM

## 2018-12-29 NOTE — Progress Notes (Addendum)
Physical Therapy Treatment Patient Details Name: Judy White MRN: [id number] DOB: August 10, 1940 Today's Date: 12/29/2018    History of Present Illness Pt s/p R TKR and with hx of COPD a-fib ablation, and lumbar fusion    PT Comments    Pt continues cooperative and progressing slowly but steadily with mobility but requiring increased time and cues for safe performance of all tasks.   Follow Up Recommendations  Home health PT;Follow surgeon's recommendation for DC plan and follow-up therapies     Equipment Recommendations  Rolling walker with 5" wheels    Recommendations for Other Services       Precautions / Restrictions Precautions Precautions: Knee;Fall Required Braces or Orthoses: Knee Immobilizer - Right Knee Immobilizer - Right: Discontinue once straight leg raise with < 10 degree lag Restrictions Weight Bearing Restrictions: No Other Position/Activity Restrictions: WBAT    Mobility  Bed Mobility Overal bed mobility: Needs Assistance Bed Mobility: Sit to Supine       Sit to supine: Min assist   General bed mobility comments: cues for sequence and assist to manage R LE  Transfers Overall transfer level: Needs assistance Equipment used: Rolling walker (2 wheeled) Transfers: Sit to/from Stand Sit to Stand: Min assist         General transfer comment: cues for LE management and use of UEs to self assist  Ambulation/Gait Ambulation/Gait assistance: Min guard Gait Distance (Feet): 100 Feet Assistive device: Rolling walker (2 wheeled) Gait Pattern/deviations: Step-to pattern;Decreased step length - right;Decreased step length - left;Shuffle;Trunk flexed;Drifts right/left Gait velocity: decr   General Gait Details: Increased time with cues for sequence, posture and position from [employer]             Wheelchair Mobility    Modified Rankin (Stroke Patients Only)       Balance Overall balance assessment: Needs assistance Sitting-balance support:  Bilateral upper extremity supported;Feet supported Sitting balance-Leahy Scale: Good     Standing balance support: Bilateral upper extremity supported Standing balance-Leahy Scale: Poor                              Cognition Arousal/Alertness: Awake/alert Behavior During Therapy: WFL for tasks assessed/performed Overall Cognitive Status: Within Functional Limits for tasks assessed                                        Exercises Total Joint Exercises Ankle Circles/Pumps: AROM;Both;15 reps;Supine Quad Sets: AROM;Both;Supine;15 reps Heel Slides: AAROM;Right;15 reps;Supine Straight Leg Raises: AAROM;Right;Supine;20 reps Goniometric ROM: AAROM R knee -5 - 40    General Comments        Pertinent Vitals/Pain Pain Assessment: 0-10 Pain Score: 3  Pain Location: R knee Pain Descriptors / Indicators: Aching;Sore Pain Intervention(s): Limited activity within patient's tolerance;Monitored during session;Premedicated before session;Ice applied    Home Living                      Prior Function            PT Goals (current goals can now be found in the care plan section) Acute Rehab PT Goals Patient Stated Goal: Regain IND PT Goal Formulation: With patient Time For Goal Achievement: 01/10/19 Potential to Achieve Goals: Good Progress towards PT goals: Progressing toward goals    Frequency    7X/week  PT Plan Current plan remains appropriate    Co-evaluation              AM-PAC PT "6 Clicks" Mobility   Outcome Measure  Help needed turning from your back to your side while in a flat bed without using bedrails?: A Little Help needed moving from lying on your back to sitting on the side of a flat bed without using bedrails?: A Little Help needed moving to and from a bed to a chair (including a wheelchair)?: A Little Help needed standing up from a chair using your arms (e.g., wheelchair or bedside chair)?: A Little Help  needed to walk in hospital room?: A Little Help needed climbing 3-5 steps with a railing? : A Lot 6 Click Score: 17    End of Session Equipment Utilized During Treatment: Gait belt;Right knee immobilizer Activity Tolerance: Patient tolerated treatment well Patient left: with call bell/phone within reach;in bed;with bed alarm set Nurse Communication: Mobility status PT Visit Diagnosis: Difficulty in walking, not elsewhere classified (R26.2)     Time: [id number] PT Time Calculation (min) (ACUTE ONLY): 37 min  Charges:  $Gait Training: 23-37 mins                      [place] Pager [phone] Office [phone]    BRADSHAW,HUNTER 12/29/2018, 4:55 PM   (addended time to reflect accurate time: Clide Dales PT supervisor)

## 2018-12-30 ENCOUNTER — Ambulatory Visit: Payer: Medicare Other | Admitting: Interventional Cardiology

## 2018-12-30 ENCOUNTER — Encounter (HOSPITAL_COMMUNITY): Payer: Self-pay | Admitting: Orthopaedic Surgery

## 2018-12-30 MED ORDER — ASPIRIN 325 MG PO TBEC: 325.0000 mg | DELAYED_RELEASE_TABLET | Freq: Two times a day (BID) | ORAL | 0 refills | Status: DC

## 2018-12-30 NOTE — Progress Notes (Signed)
Physical Therapy Treatment Patient Details Name: Judy White MRN: [id number] DOB: [birth year] Today's Date: 12/30/2018    History of Present Illness Pt s/p R TKR and with hx of COPD a-fib ablation, and lumbar fusion    PT Comments    POD # 3  Assisted OOB.  General bed mobility comments: attempted to teach pt how to use belt loop to self assist LE off bed.  General transfer comment: required repeat VC's on safety and proper hand placement.  Also assisted with tolet transfer. General Gait Details: 50% VC's on proper walker to self distance, proper sequecing and safety with turns. Practiced one step.  General stair comments: 75% VC's on proper walker placement, proper sequencing and safety going up and down one step with walker. Then returned to room to perform some TE's following HEP handout.  Instructed on proper tech, freq as well as use of ICE.   Addressed all mobility questions, discussed appropriate activity, educated on use of ICE.  Pt ready for D/C to home.   Follow Up Recommendations  Home health PT;Follow surgeon's recommendation for DC plan and follow-up therapies     Equipment Recommendations  Rolling walker with 5" wheels    Recommendations for Other Services       Precautions / Restrictions Precautions Precautions: Knee;Fall Precaution Comments: Did not use KI this session as pt was able to perform SLR Restrictions Weight Bearing Restrictions: No RLE Weight Bearing: Weight bearing as tolerated    Mobility  Bed Mobility Overal bed mobility: Needs Assistance Bed Mobility: Supine to Sit     Supine to sit: Supervision;Min guard     General bed mobility comments: attempted to teach pt how to use belt loop to self assist LE off bed  Transfers Overall transfer level: Needs assistance Equipment used: Rolling walker (2 wheeled) Transfers: Sit to/from Stand Sit to Stand: Supervision;Min guard Stand pivot transfers: Supervision;Min guard       General transfer  comment: required repeat VC's on safety and proper hand placement.  Also assisted with tolet transfer.  Ambulation/Gait Ambulation/Gait assistance: Supervision;Min guard Gait Distance (Feet): 45 Feet Assistive device: Rolling walker (2 wheeled) Gait Pattern/deviations: Step-to pattern;Decreased step length - right;Decreased step length - left;Shuffle;Trunk flexed;Drifts right/left Gait velocity: decr   General Gait Details: 50% VC's on proper walker to self distance, proper sequecing and safety with turns.   Stairs Stairs: Yes Stairs assistance: Min guard;Min assist   Number of Stairs: 1 General stair comments: 75% VC's on proper walker placement, proper sequencing and safety going up and down one step with walker.   Wheelchair Mobility    Modified Rankin (Stroke Patients Only)       Balance                                            Cognition Arousal/Alertness: Awake/alert                                     General Comments: difficulty retaining new instructions.  Required repeat VC's for all mobility and safety      Exercises   Total Knee Replacement TE's 10 reps B LE ankle pumps 10 reps towel squeezes 10 reps knee presses 10 reps heel slides  10 reps SAQ's 10 reps SLR's 10 reps ABD Followed by  ICE     General Comments        Pertinent Vitals/Pain Pain Assessment: 0-10 Pain Score: 7  Pain Location: R knee Pain Descriptors / Indicators: Aching;Sore;Grimacing Pain Intervention(s): Monitored during session;Patient requesting pain meds-RN notified;Repositioned;Ice applied    Home Living                      Prior Function            PT Goals (current goals can now be found in the care plan section) Progress towards PT goals: Progressing toward goals    Frequency           PT Plan Current plan remains appropriate    Co-evaluation              AM-PAC PT "6 Clicks" Mobility   Outcome  Measure  Help needed turning from your back to your side while in a flat bed without using bedrails?: A Little Help needed moving from lying on your back to sitting on the side of a flat bed without using bedrails?: A Little Help needed moving to and from a bed to a chair (including a wheelchair)?: A Little Help needed standing up from a chair using your arms (e.g., wheelchair or bedside chair)?: A Little Help needed to walk in hospital room?: A Little Help needed climbing 3-5 steps with a railing? : A Little 6 Click Score: 18    End of Session Equipment Utilized During Treatment: Gait belt Activity Tolerance: Patient tolerated treatment well Patient left: with call bell/phone within reach;in bed;with bed alarm set Nurse Communication: Mobility status PT Visit Diagnosis: Difficulty in walking, not elsewhere classified (R26.2)     Time: 0940-1010 PT Time Calculation (min) (ACUTE ONLY): 30 min  Charges:  $Gait Training: 8-22 mins $Therapeutic Exercise: 8-22 mins                     Rica Koyanagi  PTA Acute  Rehabilitation Services Pager      [phone] Office      [phone]

## 2018-12-30 NOTE — Discharge Summary (Signed)
Patient ID: Judy White MRN: [id number] DOB/AGE: [birth year] 78 y.o.  Admit date: 12/27/2018 Discharge date: 12/30/2018  Admission Diagnoses:  Principal Problem:   Unilateral primary osteoarthritis, right knee Active Problems:   Status post total right knee replacement   Discharge Diagnoses:  Same  Past Medical History:  Diagnosis Date  . Agoraphobia   . Anxiety   . Arthritis    "normal for the age; nothing gives me problems" (01/24/2016)  . COPD (chronic obstructive pulmonary disease) ([place])    "mild" (01/24/2016)  . Depressive disorder, not elsewhere classified   . DVT (deep venous thrombosis) ([place]) 10/08/2015   "they said the blood clot went from one of my legs to my lung"  . History of hiatal hernia    "small one" (01/24/2016)  . Hypercholesteremia   . Hypertension    pt denies this hx on 01/24/2016  . Myalgia and myositis, unspecified   . Osteopenia   . Overactive bladder   . Persistent atrial fibrillation ([place])    a. s/p Watchman implant 12/2014  . Presence of Watchman left atrial appendage closure device   . Pulmonary embolism ([place]) 10/08/2015  . Sleep apnea    USES CPAP     Surgeries: Procedure(s): RIGHT TOTAL KNEE ARTHROPLASTY on 12/27/2018   Consultants:   Discharged Condition: Improved  Hospital Course: Judy White is an 78 y.o. female who was admitted 12/27/2018 for operative treatment ofUnilateral primary osteoarthritis, right knee. Patient has severe unremitting pain that affects sleep, daily activities, and work/hobbies. After pre-op clearance the patient was taken to the operating room on 12/27/2018 and underwent  Procedure(s): RIGHT TOTAL KNEE ARTHROPLASTY.    Patient was given perioperative antibiotics:  Anti-infectives (From admission, onward)   Start     Dose/Rate Route Frequency Ordered Stop   12/27/18 1500  ceFAZolin (ANCEF) IVPB 1 g/50 mL premix     1 g 100 mL/hr over 30 Minutes Intravenous Every 6 hours 12/27/18 1341 12/27/18 2254   12/27/18 0715  ceFAZolin (ANCEF) IVPB 2g/100 mL premix     2 g 200 mL/hr over 30 Minutes Intravenous On call to O.R. 12/27/18 [id number] 12/27/18 [id number]       Patient was given sequential compression devices, early ambulation, and chemoprophylaxis to prevent DVT.  Patient benefited maximally from hospital stay and there were no complications.    Recent vital signs:  Patient Vitals for the past 24 hrs:  BP Temp Temp src Pulse Resp SpO2  12/30/18 0622 (!) 126/57 98.5 F (36.9 C) Oral 77 18 94 %  12/29/18 2315 - - - 81 16 93 %  12/29/18 2211 (!) 111/48 98.3 F (36.8 C) Oral 72 18 93 %  12/29/18 1452 (!) 110/57 98.1 F (36.7 C) Oral 72 15 94 %     Recent laboratory studies:  Recent Labs    12/28/18 0315  WBC 18.1*  HGB 12.3  HCT 38.9  PLT 205  NA 138  K 4.8  CL 102  CO2 27  BUN 19  CREATININE 0.79  GLUCOSE 162*  CALCIUM 8.3*     Discharge Medications:   Allergies as of 12/30/2018      Reactions   Rosuvastatin    Myalgias on 5mg  every other day   Statins       Medication List    TAKE these medications   acetaminophen 500 MG tablet Commonly known as: TYLENOL Take 500 mg by mouth every 6 (six) hours as needed for moderate pain or headache.  aspirin 325 MG EC tablet Take 1 tablet (325 mg total) by mouth 2 (two) times daily after a meal. What changed:   medication strength  how much to take  when to take this   Benfotiamine 150 MG Caps Take 150 mg by mouth daily.   CALTRATE 600 PLUS-VIT D PO Take 1 tablet by mouth daily.   clonazePAM 1 MG tablet Commonly known as: KLONOPIN Take 1.5-2 mg by mouth daily.   diltiazem 30 MG tablet Commonly known as: CARDIZEM Take 30 mg by mouth 4 (four) times daily as needed (irregular heartbeat).   FLUoxetine 20 MG capsule Commonly known as: PROZAC Take 20 mg by mouth daily.   furosemide 20 MG tablet Commonly known as: LASIX TAKE 1 TABLET ONCE DAILY.   gabapentin 100 MG capsule Commonly known as: NEURONTIN Take  100 mg by mouth 3 (three) times daily.   metoprolol succinate 25 MG 24 hr tablet Commonly known as: TOPROL-XL Take 12.5 mg by mouth every morning.   oxybutynin 10 MG 24 hr tablet Commonly known as: DITROPAN-XL Take 10 mg by mouth daily.   oxyCODONE 5 MG immediate release tablet Commonly known as: Oxy IR/ROXICODONE Take 1-2 tablets (5-10 mg total) by mouth every 6 (six) hours as needed for moderate pain (pain score 4-6).   Potassium 99 MG Tabs Take 99 mg by mouth daily.   vitamin C 500 MG tablet Commonly known as: ASCORBIC ACID Take 500 mg by mouth daily.   Vitamin D3 50 MCG (2000 UT) Tabs Take 2,000 Units by mouth daily.   vitamin E 400 UNIT capsule Generic drug: vitamin E Take 400 Units daily by mouth.            Durable Medical Equipment  (From admission, onward)         Start     Ordered   12/27/18 1342  DME 3 n 1  Once     12/27/18 1341   12/27/18 1342  DME Walker rolling  Once    Question:  Patient needs a walker to treat with the following condition  Answer:  Status post total right knee replacement   12/27/18 1341          Diagnostic Studies: Dg Knee Right Port  Result Date: 12/27/2018 CLINICAL DATA:  Postop right total knee EXAM: PORTABLE RIGHT KNEE - 1-2 VIEW COMPARISON:  11/27/2018 FINDINGS: Right knee arthroplasty, without evidence of complication. Associated soft tissue gas and small suprapatellar knee joint effusion. Overlying skin staples. IMPRESSION: Right knee arthroplasty, without evidence of complication. Electronically Signed   By: Julian Hy M.D.   On: 12/27/2018 11:24    Disposition: Discharge disposition: 01-Home or Self Care         Follow-up Information    Mcarthur Rossetti, MD. Go on 01/09/2019.   Specialty: Orthopedic Surgery Why: at 1:00 pm  New office location: [address] [place], [place] [address] Contact information: [place] [address] [phone]        Home,  [facility] Follow up.   Specialty: Home Health Services Why: You have been authorized for 5 home health therapy visits. Someone will be in touch with you from the agency at discharge to arrange the visit in home. Contact information: [address] [place] [address] [place]. Go on 01/09/2019.   Why: at 2:45 pm for your first outpatient therapy visit with Faustino Congress. Location: Inside  [facility] [address] [place], [place] [address] [phone]           Signed: Mcarthur Rossetti 12/30/2018, 7:24 AM

## 2018-12-30 NOTE — Progress Notes (Signed)
Subjective: 3 Days Post-Op Procedure(s) (LRB): RIGHT TOTAL KNEE ARTHROPLASTY (Right) Patient reports pain as moderate.    Objective: Vital signs in last 24 hours: Temp:  [98.1 F (36.7 C)-98.5 F (36.9 C)] 98.5 F (36.9 C) (11/02 0622) Pulse Rate:  [72-81] 77 (11/02 0622) Resp:  [15-18] 18 (11/02 0622) BP: (110-126)/(48-57) 126/57 (11/02 0622) SpO2:  [93 %-94 %] 94 % (11/02 0622)  Intake/Output from previous day: 11/01 0701 - 11/02 0700 In: 720 [P.O.:720] Out: 1250 [Urine:1250] Intake/Output this shift: No intake/output data recorded.  Recent Labs    12/28/18 0315  HGB 12.3   Recent Labs    12/28/18 0315  WBC 18.1*  RBC 4.19  HCT 38.9  PLT 205   Recent Labs    12/28/18 0315  NA 138  K 4.8  CL 102  CO2 27  BUN 19  CREATININE 0.79  GLUCOSE 162*  CALCIUM 8.3*   No results for input(s): LABPT, INR in the last 72 hours.  Sensation intact distally Intact pulses distally Dorsiflexion/Plantar flexion intact Incision: scant drainage No cellulitis present Compartment soft   Assessment/Plan: 3 Days Post-Op Procedure(s) (LRB): RIGHT TOTAL KNEE ARTHROPLASTY (Right) Up with therapy Discharge home with home health this afternoon.      Mcarthur Rossetti 12/30/2018, 7:22 AM

## 2018-12-31 ENCOUNTER — Telehealth: Payer: Self-pay | Admitting: *Deleted

## 2018-12-31 DIAGNOSIS — Z96651 Presence of right artificial knee joint: Secondary | ICD-10-CM | POA: Diagnosis not present

## 2018-12-31 DIAGNOSIS — Z86718 Personal history of other venous thrombosis and embolism: Secondary | ICD-10-CM | POA: Diagnosis not present

## 2018-12-31 DIAGNOSIS — Z471 Aftercare following joint replacement surgery: Secondary | ICD-10-CM | POA: Diagnosis not present

## 2018-12-31 DIAGNOSIS — M8580 Other specified disorders of bone density and structure, unspecified site: Secondary | ICD-10-CM | POA: Diagnosis not present

## 2018-12-31 DIAGNOSIS — I5032 Chronic diastolic (congestive) heart failure: Secondary | ICD-10-CM | POA: Diagnosis not present

## 2018-12-31 DIAGNOSIS — I110 Hypertensive heart disease with heart failure: Secondary | ICD-10-CM | POA: Diagnosis not present

## 2018-12-31 DIAGNOSIS — E7800 Pure hypercholesterolemia, unspecified: Secondary | ICD-10-CM | POA: Diagnosis not present

## 2018-12-31 DIAGNOSIS — N3281 Overactive bladder: Secondary | ICD-10-CM | POA: Diagnosis not present

## 2018-12-31 DIAGNOSIS — G4733 Obstructive sleep apnea (adult) (pediatric): Secondary | ICD-10-CM | POA: Diagnosis not present

## 2018-12-31 DIAGNOSIS — I4892 Unspecified atrial flutter: Secondary | ICD-10-CM | POA: Diagnosis not present

## 2018-12-31 DIAGNOSIS — Z86711 Personal history of pulmonary embolism: Secondary | ICD-10-CM | POA: Diagnosis not present

## 2018-12-31 DIAGNOSIS — F419 Anxiety disorder, unspecified: Secondary | ICD-10-CM | POA: Diagnosis not present

## 2018-12-31 DIAGNOSIS — Z87891 Personal history of nicotine dependence: Secondary | ICD-10-CM | POA: Diagnosis not present

## 2018-12-31 DIAGNOSIS — M48061 Spinal stenosis, lumbar region without neurogenic claudication: Secondary | ICD-10-CM | POA: Diagnosis not present

## 2018-12-31 DIAGNOSIS — I480 Paroxysmal atrial fibrillation: Secondary | ICD-10-CM | POA: Diagnosis not present

## 2018-12-31 DIAGNOSIS — J449 Chronic obstructive pulmonary disease, unspecified: Secondary | ICD-10-CM | POA: Diagnosis not present

## 2018-12-31 NOTE — Telephone Encounter (Signed)
Ortho bundle D/C call completed. 

## 2018-12-31 NOTE — Care Plan (Signed)
RNCM call to patient for D/C call. Patient was discharged from hospital yesterday, 12/30/18. Patient was working with home health therapy during [facility] call, so spoke with her husband. He states she received all DME from the hospital, she is working with therapy and seems to be doing well. Requested she call RNCM back with any questions/needs or concerns after her therapy visit. Husband gave her the message while CM was on the phone.

## 2019-01-02 DIAGNOSIS — Z471 Aftercare following joint replacement surgery: Secondary | ICD-10-CM | POA: Diagnosis not present

## 2019-01-02 DIAGNOSIS — I480 Paroxysmal atrial fibrillation: Secondary | ICD-10-CM | POA: Diagnosis not present

## 2019-01-02 DIAGNOSIS — J449 Chronic obstructive pulmonary disease, unspecified: Secondary | ICD-10-CM | POA: Diagnosis not present

## 2019-01-02 DIAGNOSIS — I5032 Chronic diastolic (congestive) heart failure: Secondary | ICD-10-CM | POA: Diagnosis not present

## 2019-01-02 DIAGNOSIS — I110 Hypertensive heart disease with heart failure: Secondary | ICD-10-CM | POA: Diagnosis not present

## 2019-01-02 DIAGNOSIS — G4733 Obstructive sleep apnea (adult) (pediatric): Secondary | ICD-10-CM | POA: Diagnosis not present

## 2019-01-03 ENCOUNTER — Telehealth: Payer: Self-pay | Admitting: *Deleted

## 2019-01-03 DIAGNOSIS — I480 Paroxysmal atrial fibrillation: Secondary | ICD-10-CM | POA: Diagnosis not present

## 2019-01-03 DIAGNOSIS — I110 Hypertensive heart disease with heart failure: Secondary | ICD-10-CM | POA: Diagnosis not present

## 2019-01-03 DIAGNOSIS — G4733 Obstructive sleep apnea (adult) (pediatric): Secondary | ICD-10-CM | POA: Diagnosis not present

## 2019-01-03 DIAGNOSIS — Z471 Aftercare following joint replacement surgery: Secondary | ICD-10-CM | POA: Diagnosis not present

## 2019-01-03 DIAGNOSIS — I5032 Chronic diastolic (congestive) heart failure: Secondary | ICD-10-CM | POA: Diagnosis not present

## 2019-01-03 DIAGNOSIS — J449 Chronic obstructive pulmonary disease, unspecified: Secondary | ICD-10-CM | POA: Diagnosis not present

## 2019-01-03 NOTE — Telephone Encounter (Signed)
1 week Ortho bundle call completed. 

## 2019-01-03 NOTE — Care Plan (Signed)
RNCM call to patient to check status 1 week post-op. Reports she is doing well. Reviewed that she is taking Oxycodone 5 mg 1 tablet every 6 hours. Pain reported as moderate. HHPT has been out and she is doing well with this. Reminded that her appointment with Dr. Ninfa Linden is scheduled for next Thursday, 01/09/19 at 1:00 pm with OPPT eval at 2:45 pm in same office. Reminded how to contact [facility] for further needs.

## 2019-01-07 ENCOUNTER — Other Ambulatory Visit: Payer: Self-pay | Admitting: Orthopaedic Surgery

## 2019-01-07 DIAGNOSIS — I480 Paroxysmal atrial fibrillation: Secondary | ICD-10-CM | POA: Diagnosis not present

## 2019-01-07 DIAGNOSIS — I110 Hypertensive heart disease with heart failure: Secondary | ICD-10-CM | POA: Diagnosis not present

## 2019-01-07 DIAGNOSIS — J449 Chronic obstructive pulmonary disease, unspecified: Secondary | ICD-10-CM | POA: Diagnosis not present

## 2019-01-07 DIAGNOSIS — I5032 Chronic diastolic (congestive) heart failure: Secondary | ICD-10-CM | POA: Diagnosis not present

## 2019-01-07 DIAGNOSIS — Z471 Aftercare following joint replacement surgery: Secondary | ICD-10-CM | POA: Diagnosis not present

## 2019-01-07 DIAGNOSIS — G4733 Obstructive sleep apnea (adult) (pediatric): Secondary | ICD-10-CM | POA: Diagnosis not present

## 2019-01-07 NOTE — Telephone Encounter (Signed)
Please advise 

## 2019-01-08 DIAGNOSIS — I110 Hypertensive heart disease with heart failure: Secondary | ICD-10-CM | POA: Diagnosis not present

## 2019-01-08 DIAGNOSIS — Z471 Aftercare following joint replacement surgery: Secondary | ICD-10-CM | POA: Diagnosis not present

## 2019-01-08 DIAGNOSIS — I5032 Chronic diastolic (congestive) heart failure: Secondary | ICD-10-CM | POA: Diagnosis not present

## 2019-01-08 DIAGNOSIS — J449 Chronic obstructive pulmonary disease, unspecified: Secondary | ICD-10-CM | POA: Diagnosis not present

## 2019-01-08 DIAGNOSIS — I480 Paroxysmal atrial fibrillation: Secondary | ICD-10-CM | POA: Diagnosis not present

## 2019-01-08 DIAGNOSIS — G4733 Obstructive sleep apnea (adult) (pediatric): Secondary | ICD-10-CM | POA: Diagnosis not present

## 2019-01-09 ENCOUNTER — Ambulatory Visit (INDEPENDENT_AMBULATORY_CARE_PROVIDER_SITE_OTHER): Payer: Medicare Other | Admitting: Physical Therapy

## 2019-01-09 ENCOUNTER — Encounter: Payer: Self-pay | Admitting: Physical Therapy

## 2019-01-09 ENCOUNTER — Ambulatory Visit (INDEPENDENT_AMBULATORY_CARE_PROVIDER_SITE_OTHER): Payer: Medicare Other | Admitting: Orthopaedic Surgery

## 2019-01-09 ENCOUNTER — Encounter: Payer: Self-pay | Admitting: Orthopaedic Surgery

## 2019-01-09 ENCOUNTER — Other Ambulatory Visit: Payer: Self-pay

## 2019-01-09 ENCOUNTER — Telehealth: Payer: Self-pay | Admitting: *Deleted

## 2019-01-09 DIAGNOSIS — R2681 Unsteadiness on feet: Secondary | ICD-10-CM | POA: Diagnosis not present

## 2019-01-09 DIAGNOSIS — M6281 Muscle weakness (generalized): Secondary | ICD-10-CM | POA: Diagnosis not present

## 2019-01-09 DIAGNOSIS — R269 Unspecified abnormalities of gait and mobility: Secondary | ICD-10-CM | POA: Diagnosis not present

## 2019-01-09 DIAGNOSIS — M25561 Pain in right knee: Secondary | ICD-10-CM

## 2019-01-09 DIAGNOSIS — Z96651 Presence of right artificial knee joint: Secondary | ICD-10-CM

## 2019-01-09 NOTE — Progress Notes (Signed)
The patient is 2 weeks tomorrow status post a total knee arthroplasty of the right knee.  She is doing well.  Therapy notes state that she is flexing up to 80 degrees except for yesterday she was on between 6 and 7 degrees.  She actually has a physical therapy session as an outpatient here today this afternoon.  She has been on 325 mg aspirin.  She was on 81 mg aspirin prior to this and she can go back on that today.  Her calf is soft.  On examination I did take out staples in place Steri-Strips.  She does have some limitations in her flexion and extension of that knee.  She is ambulate with a walker.  I stressed the importance of outpatient physical therapy with getting her knee flexing and extending.  All question concerns were answered and addressed.  We will see her back in 4 weeks to see how she is doing from ability and range of motion standpoint.  No x-rays are needed.  If she does need more pain medication in the interim she knows to give [place] a call so we can order it for her.

## 2019-01-09 NOTE — Therapy (Signed)
[facility] [address] [place], [place], [address] Phone: [phone]   Fax:  [phone]  Physical Therapy Evaluation  Patient Details  Name: Judy White MRN: [id number] Date of Birth: 06-20-1940 Referring Provider (PT): Jean Rosenthal MD    Encounter Date: 01/09/2019  PT End of Session - 01/09/19 1442    Visit Number  1    Number of Visits  19    Date for PT Re-Evaluation  02/20/19    PT Start Time  1330    PT Stop Time  1430    PT Time Calculation (min)  60 min    Activity Tolerance  Patient tolerated treatment well;Patient limited by pain    Behavior During Therapy  [facility] for tasks assessed/performed       Past Medical History:  Diagnosis Date  . Agoraphobia   . Anxiety   . Arthritis    "normal for the age; nothing gives me problems" (01/24/2016)  . COPD (chronic obstructive pulmonary disease) ([place])    "mild" (01/24/2016)  . Depressive disorder, not elsewhere classified   . DVT (deep venous thrombosis) ([place]) 10/08/2015   "they said the blood clot went from one of my legs to my lung"  . History of hiatal hernia    "small one" (01/24/2016)  . Hypercholesteremia   . Hypertension    pt denies this hx on 01/24/2016  . Myalgia and myositis, unspecified   . Osteopenia   . Overactive bladder   . Persistent atrial fibrillation ([place])    a. s/p Watchman implant 12/2014  . Presence of Watchman left atrial appendage closure device   . Pulmonary embolism ([place]) 10/08/2015  . Sleep apnea    USES CPAP     Past Surgical History:  Procedure Laterality Date  . APPENDECTOMY  1983  . ATRIAL FIBRILLATION ABLATION  06/28/2017   PER PATIENT SUCCESSFUL ABLATION , CARDIO [place] , [place]  . BACK SURGERY    . BRONCHOSCOPY  01/24/2016  . CARDIAC CATHETERIZATION     >5 yrs  . CATARACT EXTRACTION W/ INTRAOCULAR LENS  IMPLANT, BILATERAL Bilateral   . CESAREAN SECTION  1967  . LEFT ATRIAL APPENDAGE OCCLUSION N/A  12/31/2014   Procedure: LEFT ATRIAL APPENDAGE OCCLUSION;  Surgeon: Thompson Grayer, MD;  Location: [place] [facility];  Service: Cardiovascular;  Laterality: N/A;  . POSTERIOR FUSION LUMBAR SPINE Bilateral 07/2014  . SHOULDER ARTHROSCOPY W/ ROTATOR CUFF REPAIR Left   . SHOULDER SURGERY Right    "tendon broke; couldn't be repaired"  . TEE WITHOUT CARDIOVERSION N/A 12/22/2014   Procedure: TRANSESOPHAGEAL ECHOCARDIOGRAM (TEE);  Surgeon: Pixie Casino, MD;  Location: [facility] ENDOSCOPY;  Service: Cardiovascular;  Laterality: N/A;  . TEE WITHOUT CARDIOVERSION N/A 02/16/2015   post Watchman TEE with good seal and no leak around device   . TONSILLECTOMY    . TOOTH EXTRACTION  05/2018  . TOTAL ABDOMINAL HYSTERECTOMY    . TOTAL KNEE ARTHROPLASTY Right 12/27/2018   Procedure: RIGHT TOTAL KNEE ARTHROPLASTY;  Surgeon: Mcarthur Rossetti, MD;  [facility]: [facility];  Service: Orthopedics;  Laterality: Right;  Marland Kitchen VIDEO BRONCHOSCOPY Bilateral 01/24/2016   Procedure: VIDEO BRONCHOSCOPY WITH FLUORO;  Surgeon: Collene Gobble, MD;  Location: [place];  Service: Cardiopulmonary;  Laterality: Bilateral;  . WISDOM TOOTH EXTRACTION      There were no vitals filed for this visit.   Subjective Assessment - 01/09/19 1344    Subjective  Pt is an 78 y/o female presenting  to OPPT post R TKR on 12/27/2018 with a 2W walker. Pt has previously had 3 HHPT visits since post op.    Pertinent History  COPD, Hx of DVT/PE, HTN, osteopenia    Limitations  Standing;Walking    How long can you stand comfortably?  10-15 minutes    How long can you walk comfortably?  not actively walking right now aside from home ambulation    Patient Stated Goals  'get back to normal', mow grass, walking without pain, get rid of AD    Currently in Pain?  Yes    Pain Score  6    up to 10/10, best: 0/10   Pain Location  Knee    Pain Orientation  Right;Medial;Anterior    Pain Descriptors / Indicators  Stabbing;Throbbing    Pain Type  Acute  pain;Surgical pain    Pain Frequency  Intermittent    Aggravating Factors   standing, walking, bending, prolonged end range knee extension    Pain Relieving Factors  ice, rest, pain medication         [facility] PT Assessment - 01/09/19 1352      Assessment   Medical Diagnosis  R TKR    Referring Provider (PT)  Jean Rosenthal MD     Onset Date/Surgical Date  12/27/18    Hand Dominance  Right    Next MD Visit  02/06/19    Prior Therapy  3 visits with HHPT       Precautions   Precautions  Fall      Restrictions   Weight Bearing Restrictions  No      Balance Screen   Has the patient fallen in the past 6 months  No    Has the patient had a decrease in activity level because of a fear of falling?   Yes    Is the patient reluctant to leave their home because of a fear of falling?   No      Home Social worker  Private residence    Living Arrangements  Spouse/significant other    Available Help at Discharge  Family;Friend(s)    Type of [place] to enter    Entrance Stairs-Number of Steps  1    Entrance Stairs-Rails  None    Home Layout  Two level;Able to live on main level with bedroom/bathroom    [place] - 2 wheels;Cane - single point;Bedside commode;Shower seat - built in;Grab bars - tub/shower      Prior Function   Level of Independence  Independent    Vocation  Retired    Leisure  Building services engineer, go out with friends, no regular exercise      Cognition   Overall Cognitive Status  Within Functional Limits for tasks assessed      Observation/Other Assessments   Observations  brusing R posterior knee, slight quad lag with SLR      ROM / Strength   AROM / PROM / Strength  AROM;PROM;Strength      AROM   AROM Assessment Site  Knee    Right/Left Knee  Right    Right Knee Extension  -11    Right Knee Flexion  57      PROM   PROM Assessment Site  Knee    Right/Left Knee  Right    Right Knee Extension  -6     Right Knee Flexion  67  Strength   Strength Assessment Site  Knee      Palpation   Patella mobility  WNL      Ambulation/Gait   Ambulation/Gait  Yes    Ambulation/Gait Assistance  6: Modified independent (Device/Increase time)    Assistive device  Rolling walker    Gait Pattern  Antalgic;Decreased step length - left;Decreased stance time - right;Decreased hip/knee flexion - right;Decreased weight shift to right    Ambulation Surface  Level;Indoor                Objective measurements completed on examination: See above findings.      [facility] Adult PT Treatment/Exercise - 01/09/19 1352      Transfers   Transfers  --      Exercises   Exercises  Knee/Hip   Simultaneous filing. User may not have seen previous data.     Knee/Hip Exercises: Supine   Quad Sets  AROM;Right   3-4 reps with verbal cues for technique   Heel Slides  AROM;Right   3-4 reps with verbal cues for technique   Straight Leg Raises  AROM;Right   3-4 reps with verbal cues for technique     Modalities   Modalities  Cryotherapy      Cryotherapy   Number Minutes Cryotherapy  10 Minutes    Cryotherapy Location  Knee   right   Type of Cryotherapy  Other (comment)   game ready            PT Education - 01/09/19 1411    Education Details  Initial HEP    Person(s) Educated  Patient    Methods  Explanation;Demonstration;Tactile cues;Verbal cues;Handout    Comprehension  Verbalized understanding;Returned demonstration;Verbal cues required;Tactile cues required;Need further instruction       PT Short Term Goals - 01/09/19 1412      PT SHORT TERM GOAL #1   Title  Pt demonstrates full independence with initial HEP.    Time  2    Period  Weeks    Status  New    Target Date  01/23/19      PT SHORT TERM GOAL #2   Title  --    Baseline  --    Time  --    Period  --    Status  --        PT Long Term Goals - 01/09/19 1413      PT LONG TERM GOAL #1   Title  Pt is fully  independent with advanced HEP.    Time  6    Period  Weeks    Status  New    Target Date  02/20/19      PT LONG TERM GOAL #2   Title  Pt demonstrates R knee AROM 0-115 for functional mobility and independence.    Baseline  01/09/19: ext -11, flex 57    Time  6    Period  Weeks    Status  New    Target Date  02/20/19      PT LONG TERM GOAL #3   Title  Pt demonstrates ability to perform steps independently for functional stair ambulation to get into home.    Time  6    Period  Weeks    Status  New    Target Date  02/20/19      PT LONG TERM GOAL #4   Title  Pt demonstrates ability to safely ambulate without and AD for functional gait and  independence.    Baseline  01/09/19: enter/exit with 2W walker    Time  6    Period  Weeks    Status  New    Target Date  02/20/19             Plan - 01/09/19 1414    Clinical Impression Statement  Pt is a pleasant 78 y/o female presenting to OPPT s/p R TKR on 12/27/18. PT examination revealed deficits in strength, ROM, and abnormalities of gait that effects safe functional mobility. Pt will benefit from skilled therapy services to address deficits and progress towards achieving functional goals.    Personal Factors and Comorbidities  Age;Comorbidity 3+;Sex    Comorbidities  COPD, HTN, Osteopenia    Examination-Activity Limitations  Bathing;Bed Mobility;Locomotion Level;Stand;Stairs;Transfers;Dressing;Toileting    Examination-Participation Restrictions  Community Activity    Clinical Decision Making  Low    Rehab Potential  Good    PT Frequency  3x / week    PT Duration  6 weeks    PT Treatment/Interventions  Cryotherapy;Gait training;Stair training;Neuromuscular re-education;Electrical Stimulation;Therapeutic exercise;Therapeutic activities;Balance training;Patient/family education;Manual techniques;Vasopneumatic Device;Passive range of motion    PT Next Visit Plan  continue PNF/PROM, strengthening    PT Home Exercise Plan  Access Code:  [email or web address]    Consulted and Agree with Plan of Care  Patient       Patient will benefit from skilled therapeutic intervention in order to improve the following deficits and impairments:  Abnormal gait, Decreased range of motion, Difficulty walking, Decreased endurance, Decreased activity tolerance, Pain, Decreased strength, Decreased balance  Visit Diagnosis: Acute pain of right knee - Plan: PT plan of care cert/re-cert  Muscle weakness (generalized) - Plan: PT plan of care cert/re-cert  Abnormality of gait - Plan: PT plan of care cert/re-cert  Unsteadiness on feet - Plan: PT plan of care cert/re-cert     Problem List Patient Active Problem List   Diagnosis Date Noted  . Status post total right knee replacement 12/27/2018  . Unilateral primary osteoarthritis, right knee 11/27/2018  . Rib fracture 04/22/2018  . Pneumothorax on left 01/24/2016  . Abnormal CT of the chest   . Pulmonary embolism ([place])   . Interstitial lung disease ([place]) 10/08/2015  . Paroxysmal atrial fibrillation ([facility])   . Typical atrial flutter ([place])   . Chronic diastolic heart failure ([place]) 08/25/2014  . Lumbar stenosis 08/11/2014  . On amiodarone therapy 05/21/2013  . Hypercholesteremia   . Essential hypertension   . Myalgia and myositis, unspecified   . COPD GOLD 0    . Overactive bladder     Juliann Pulse  SPT 01/09/2019, 4:05 PM    Read, reviewed, edited and agree with student's findings and recommendations.  During this treatment session, the therapist was present, participating in and directing the treatment.  Laureen Abrahams, PT, DPT 01/09/19 4:05 PM   [facility] [address] [place], [place], [address] Phone: [phone]   Fax:  [phone]  Name: CAMYIAH HEIM MRN: [id number] Date of Birth: [birth year]

## 2019-01-09 NOTE — Patient Instructions (Signed)
Access Code: [email or web address]  URL: [email or web address]  Date: 01/09/2019  Prepared by: Faustino Congress   Exercises  Supine Ankle Pumps - 10 reps - 1 sets - 3x daily - 7x weekly  Supine Quad Set - 10 reps - 1 sets - 3x daily - 7x weekly  Supine Heel Slide with Strap - 10 reps - 1 sets - 3x daily - 7x weekly  Supine Knee Extension Mobilization with Weight - 1 reps - 1 sets - 3-5 min hold - 3x daily - 7x weekly  Supine Active Straight Leg Raise - 10 reps - 1 sets - 3x daily - 7x weekly

## 2019-01-09 NOTE — Telephone Encounter (Signed)
14 day Ortho bundle call/office visit completed.

## 2019-01-09 NOTE — Care Plan (Signed)
RNCM met with patient in office for her 2 week post-op for the Right TKA done on 12/27/18. She is ambulating with a FWW and is doing well. She reports HHPT has discharged as she is supposed to start OPPT today after her visit with Dr. Ninfa Linden. Staples removed and steri-strips applied per Dr. Ninfa Linden. May now decrease 325 mg Aspirin back to her 81 mg Aspirin once daily. Follow up in 4 weeks. Escorted patient to therapy after MD visit. Therapy treatment plan discussed with therapist of 3 x weekly for 4 weeks up to her next MD visit. All appointments scheduled to this date with therapy. Will continue to follow along for CM needs.

## 2019-01-13 ENCOUNTER — Other Ambulatory Visit: Payer: Self-pay

## 2019-01-13 ENCOUNTER — Ambulatory Visit (INDEPENDENT_AMBULATORY_CARE_PROVIDER_SITE_OTHER): Payer: Medicare Other | Admitting: Physical Therapy

## 2019-01-13 DIAGNOSIS — M6281 Muscle weakness (generalized): Secondary | ICD-10-CM

## 2019-01-13 DIAGNOSIS — M25561 Pain in right knee: Secondary | ICD-10-CM | POA: Diagnosis not present

## 2019-01-13 DIAGNOSIS — R269 Unspecified abnormalities of gait and mobility: Secondary | ICD-10-CM

## 2019-01-13 DIAGNOSIS — R2681 Unsteadiness on feet: Secondary | ICD-10-CM

## 2019-01-13 NOTE — Therapy (Signed)
[facility] [address] [place], [place], [address] Phone: [phone]   Fax:  [phone]  Physical Therapy Treatment  Patient Details  Name: Judy White MRN: [id number] Date of Birth: 1940-07-19 Referring Provider (PT): Jean Rosenthal MD    Encounter Date: 01/13/2019  PT End of Session - 01/13/19 1423    Visit Number  2    Number of Visits  19    Date for PT Re-Evaluation  02/20/19    PT Start Time  1400    PT Stop Time  1445    PT Time Calculation (min)  45 min    Activity Tolerance  Patient tolerated treatment well;Patient limited by pain    Behavior During Therapy  [facility] for tasks assessed/performed       Past Medical History:  Diagnosis Date  . Agoraphobia   . Anxiety   . Arthritis    "normal for the age; nothing gives me problems" (01/24/2016)  . COPD (chronic obstructive pulmonary disease) ([place])    "mild" (01/24/2016)  . Depressive disorder, not elsewhere classified   . DVT (deep venous thrombosis) ([place]) 10/08/2015   "they said the blood clot went from one of my legs to my lung"  . History of hiatal hernia    "small one" (01/24/2016)  . Hypercholesteremia   . Hypertension    pt denies this hx on 01/24/2016  . Myalgia and myositis, unspecified   . Osteopenia   . Overactive bladder   . Persistent atrial fibrillation ([place])    a. s/p Watchman implant 12/2014  . Presence of Watchman left atrial appendage closure device   . Pulmonary embolism ([place]) 10/08/2015  . Sleep apnea    USES CPAP     Past Surgical History:  Procedure Laterality Date  . APPENDECTOMY  1983  . ATRIAL FIBRILLATION ABLATION  06/28/2017   PER PATIENT SUCCESSFUL ABLATION , CARDIO [place] , [place]  . BACK SURGERY    . BRONCHOSCOPY  01/24/2016  . CARDIAC CATHETERIZATION     >5 yrs  . CATARACT EXTRACTION W/ INTRAOCULAR LENS  IMPLANT, BILATERAL Bilateral   . CESAREAN SECTION  1967  . LEFT ATRIAL APPENDAGE OCCLUSION N/A  12/31/2014   Procedure: LEFT ATRIAL APPENDAGE OCCLUSION;  Surgeon: Thompson Grayer, MD;  Location: [place] [facility];  Service: Cardiovascular;  Laterality: N/A;  . POSTERIOR FUSION LUMBAR SPINE Bilateral 07/2014  . SHOULDER ARTHROSCOPY W/ ROTATOR CUFF REPAIR Left   . SHOULDER SURGERY Right    "tendon broke; couldn't be repaired"  . TEE WITHOUT CARDIOVERSION N/A 12/22/2014   Procedure: TRANSESOPHAGEAL ECHOCARDIOGRAM (TEE);  Surgeon: Pixie Casino, MD;  Location: [facility] ENDOSCOPY;  Service: Cardiovascular;  Laterality: N/A;  . TEE WITHOUT CARDIOVERSION N/A 02/16/2015   post Watchman TEE with good seal and no leak around device   . TONSILLECTOMY    . TOOTH EXTRACTION  05/2018  . TOTAL ABDOMINAL HYSTERECTOMY    . TOTAL KNEE ARTHROPLASTY Right 12/27/2018   Procedure: RIGHT TOTAL KNEE ARTHROPLASTY;  Surgeon: Mcarthur Rossetti, MD;  Location: [facility];  Service: Orthopedics;  Laterality: Right;  Marland Kitchen VIDEO BRONCHOSCOPY Bilateral 01/24/2016   Procedure: VIDEO BRONCHOSCOPY WITH FLUORO;  Surgeon: Collene Gobble, MD;  Location: [facility] [place];  Service: Cardiopulmonary;  Laterality: Bilateral;  . WISDOM TOOTH EXTRACTION      There were no vitals filed for this visit.  Subjective Assessment - 01/13/19 1421    Subjective  Pt says Rt knee pain about 7 upon  arrival but feels better after session    Pertinent History  COPD, Hx of DVT/PE, HTN, osteopenia    Limitations  Standing;Walking    How long can you stand comfortably?  10-15 minutes    How long can you walk comfortably?  not actively walking right now aside from home ambulation    Patient Stated Goals  'get back to normal', mow grass, walking without pain, get rid of AD                       [facility] Adult PT Treatment/Exercise - 01/13/19 0001      Ambulation/Gait   Ambulation/Gait  Yes    Ambulation/Gait Assistance  6: Modified independent (Device/Increase time)    Gait Comments  ambulated 88 ft X2 in clinic with RW with decreased  hip/knee flexion in swing phase and decreased extension in stance phase with foot ER, after cues to keep Rt foot from turning out and to march up more when she walks she was able to improve gait pattern some. Then had her move to // bars for ambulating without UE support up/down X 2 then lateral walking up/down X 2 no LOB noted      Knee/Hip Exercises: Stretches   Active Hamstring Stretch  Right;2 reps;30 seconds    Active Hamstring Stretch Limitations  supine with strap    Quad Stretch  Right;2 reps;30 seconds    Quad Stretch Limitations  supine with strap off side of bed    Other Knee/Hip Stretches  heelslides AAROM 5 sec X 15 with strap      Knee/Hip Exercises: Aerobic   Recumbent Bike  8 min rocking for ROM (unable to peroform full revolutions yet)      Knee/Hip Exercises: Seated   Long Arc Quad  Right;15 reps    Hamstring Curl  Right;15 reps    Hamstring Limitations  orange band      Knee/Hip Exercises: Supine   Quad Sets  AROM;Right;15 reps    Quad Sets Limitations  hold 5 sec    Short Arc [employer]  Right;15 reps    Straight Leg Raises  Both;15 reps      Manual Therapy   Manual therapy comments  Rt knee PROM, patell mobs, gentle distraction mobs from flexed knee, manual hamstring stretching             PT Education - 01/13/19 1422    Education Details  PT plan of care, what to expect with PT, need for gentle ROM and stretching for pain reduction    Person(s) Educated  Patient    Methods  Explanation    Comprehension  Verbalized understanding       PT Short Term Goals - 01/09/19 1412      PT SHORT TERM GOAL #1   Title  Pt demonstrates full independence with initial HEP.    Time  2    Period  Weeks    Status  New    Target Date  01/23/19      PT SHORT TERM GOAL #2   Title  --    Baseline  --    Time  --    Period  --    Status  --        PT Long Term Goals - 01/09/19 1413      PT LONG TERM GOAL #1   Title  Pt is fully independent with advanced HEP.     Time  6  Period  Weeks    Status  New    Target Date  02/20/19      PT LONG TERM GOAL #2   Title  Pt demonstrates R knee AROM 0-115 for functional mobility and independence.    Baseline  01/09/19: ext -11, flex 57    Time  6    Period  Weeks    Status  New    Target Date  02/20/19      PT LONG TERM GOAL #3   Title  Pt demonstrates ability to perform steps independently for functional stair ambulation to get into home.    Time  6    Period  Weeks    Status  New    Target Date  02/20/19      PT LONG TERM GOAL #4   Title  Pt demonstrates ability to safely ambulate without and AD for functional gait and independence.    Baseline  01/09/19: enter/exit with 2W walker    Time  6    Period  Weeks    Status  New    Target Date  02/20/19            Plan - 01/13/19 1427    Clinical Impression Statement  Session focused on Rt knee ROM and Rt hip/knee strength to tolerance. She was treated with MT to increase ROM and decrease pain. Gait training today to increase hip/knee flexion during stance and reduce foot ER. PT will continue to progress as able.    Personal Factors and Comorbidities  Age;Comorbidity 3+;Sex    Comorbidities  COPD, HTN, Osteopenia    Examination-Activity Limitations  Bathing;Bed Mobility;Locomotion Level;Stand;Stairs;Transfers;Dressing;Toileting    Examination-Participation Restrictions  Community Activity    Rehab Potential  Good    PT Frequency  3x / week    PT Duration  6 weeks    PT Treatment/Interventions  Cryotherapy;Gait training;Stair training;Neuromuscular re-education;Electrical Stimulation;Therapeutic exercise;Therapeutic activities;Balance training;Patient/family education;Manual techniques;Vasopneumatic Device;Passive range of motion    PT Next Visit Plan  continue PNF/PROM, strengthening    PT Home Exercise Plan  Access Code: [email or web address]    Consulted and Agree with Plan of Care  Patient       Patient will benefit from skilled therapeutic  intervention in order to improve the following deficits and impairments:  Abnormal gait, Decreased range of motion, Difficulty walking, Decreased endurance, Decreased activity tolerance, Pain, Decreased strength, Decreased balance  Visit Diagnosis: Acute pain of right knee  Muscle weakness (generalized)  Abnormality of gait  Unsteadiness on feet     Problem List Patient Active Problem List   Diagnosis Date Noted  . Status post total right knee replacement 12/27/2018  . Unilateral primary osteoarthritis, right knee 11/27/2018  . Rib fracture 04/22/2018  . Pneumothorax on left 01/24/2016  . Abnormal CT of the chest   . Pulmonary embolism ([place])   . Interstitial lung disease ([place]) 10/08/2015  . Paroxysmal atrial fibrillation ([facility])   . Typical atrial flutter ([place])   . Chronic diastolic heart failure ([place]) 08/25/2014  . Lumbar stenosis 08/11/2014  . On amiodarone therapy 05/21/2013  . Hypercholesteremia   . Essential hypertension   . Myalgia and myositis, unspecified   . COPD GOLD 0    . Overactive bladder     Silvestre Mesi 01/13/2019, 3:00 PM  [facility] [address] [place], [place], [address] Phone: [phone]   Fax:  [phone]  Name: Judy White MRN: [id number] Date of Birth: May 03, 1940

## 2019-01-15 ENCOUNTER — Other Ambulatory Visit: Payer: Self-pay

## 2019-01-15 ENCOUNTER — Ambulatory Visit (INDEPENDENT_AMBULATORY_CARE_PROVIDER_SITE_OTHER): Payer: Medicare Other | Admitting: Physical Therapy

## 2019-01-15 ENCOUNTER — Encounter: Payer: Self-pay | Admitting: Physical Therapy

## 2019-01-15 DIAGNOSIS — R269 Unspecified abnormalities of gait and mobility: Secondary | ICD-10-CM

## 2019-01-15 DIAGNOSIS — M6281 Muscle weakness (generalized): Secondary | ICD-10-CM | POA: Diagnosis not present

## 2019-01-15 DIAGNOSIS — M25561 Pain in right knee: Secondary | ICD-10-CM | POA: Diagnosis not present

## 2019-01-15 DIAGNOSIS — R2681 Unsteadiness on feet: Secondary | ICD-10-CM

## 2019-01-15 NOTE — Therapy (Signed)
[facility] Physical Therapy [address] [place], [place], [address] Phone: [phone]   Fax:  [phone]  Physical Therapy Treatment  Patient Details  Name: Judy White MRN: [id number] Date of Birth: 1940-11-03 Referring Provider (PT): Jean Rosenthal MD    Encounter Date: 01/15/2019  PT End of Session - 01/15/19 1251    Visit Number  3    Number of Visits  19    Date for PT Re-Evaluation  02/20/19    PT Start Time  [id number]    PT Stop Time  1235    PT Time Calculation (min)  50 min    Activity Tolerance  Patient tolerated treatment well;Patient limited by pain    Behavior During Therapy  [facility] for tasks assessed/performed       Past Medical History:  Diagnosis Date  . Agoraphobia   . Anxiety   . Arthritis    "normal for the age; nothing gives me problems" (01/24/2016)  . COPD (chronic obstructive pulmonary disease) ([place])    "mild" (01/24/2016)  . Depressive disorder, not elsewhere classified   . DVT (deep venous thrombosis) ([place]) 10/08/2015   "they said the blood clot went from one of my legs to my lung"  . History of hiatal hernia    "small one" (01/24/2016)  . Hypercholesteremia   . Hypertension    pt denies this hx on 01/24/2016  . Myalgia and myositis, unspecified   . Osteopenia   . Overactive bladder   . Persistent atrial fibrillation ([place])    a. s/p Watchman implant 12/2014  . Presence of Watchman left atrial appendage closure device   . Pulmonary embolism ([place]) 10/08/2015  . Sleep apnea    USES CPAP     Past Surgical History:  Procedure Laterality Date  . APPENDECTOMY  1983  . ATRIAL FIBRILLATION ABLATION  06/28/2017   PER PATIENT SUCCESSFUL ABLATION , CARDIO [place] , [place]  . BACK SURGERY    . BRONCHOSCOPY  01/24/2016  . CARDIAC CATHETERIZATION     >5 yrs  . CATARACT EXTRACTION W/ INTRAOCULAR LENS  IMPLANT, BILATERAL Bilateral   . CESAREAN SECTION  1967  . LEFT ATRIAL APPENDAGE OCCLUSION N/A  12/31/2014   Procedure: LEFT ATRIAL APPENDAGE OCCLUSION;  Surgeon: Thompson Grayer, MD;  Location: [place] [facility];  Service: Cardiovascular;  Laterality: N/A;  . POSTERIOR FUSION LUMBAR SPINE Bilateral 07/2014  . SHOULDER ARTHROSCOPY W/ ROTATOR CUFF REPAIR Left   . SHOULDER SURGERY Right    "tendon broke; couldn't be repaired"  . TEE WITHOUT CARDIOVERSION N/A 12/22/2014   Procedure: TRANSESOPHAGEAL ECHOCARDIOGRAM (TEE);  Surgeon: Pixie Casino, MD;  Location: [facility] ENDOSCOPY;  Service: Cardiovascular;  Laterality: N/A;  . TEE WITHOUT CARDIOVERSION N/A 02/16/2015   post Watchman TEE with good seal and no leak around device   . TONSILLECTOMY    . TOOTH EXTRACTION  05/2018  . TOTAL ABDOMINAL HYSTERECTOMY    . TOTAL KNEE ARTHROPLASTY Right 12/27/2018   Procedure: RIGHT TOTAL KNEE ARTHROPLASTY;  Surgeon: Mcarthur Rossetti, MD;  Location: [facility];  Service: Orthopedics;  Laterality: Right;  Marland Kitchen VIDEO BRONCHOSCOPY Bilateral 01/24/2016   Procedure: VIDEO BRONCHOSCOPY WITH FLUORO;  Surgeon: Collene Gobble, MD;  Location: [place];  Service: Cardiopulmonary;  Laterality: Bilateral;  . WISDOM TOOTH EXTRACTION      There were no vitals filed for this visit.  Subjective Assessment - 01/15/19 1150    Subjective  "it hurts like hell."    Pertinent  History  COPD, Hx of DVT/PE, HTN, osteopenia    Limitations  Standing;Walking    How long can you stand comfortably?  10-15 minutes    How long can you walk comfortably?  not actively walking right now aside from home ambulation    Patient Stated Goals  'get back to normal', mow grass, walking without pain, get rid of AD    Currently in Pain?  Yes    Pain Score  8     Pain Location  Knee    Pain Orientation  Right;Anterior;Medial    Pain Descriptors / Indicators  Stabbing;Throbbing    Pain Type  Acute pain;Surgical pain    Pain Onset  1 to 4 weeks ago    Pain Frequency  Intermittent    Aggravating Factors   standing, walking, bending, prolonged  end range knee extension    Pain Relieving Factors  ice, rest, pain meds         [facility] PT Assessment - 01/15/19 1231      Assessment   Medical Diagnosis  R TKR    Referring Provider (PT)  Jean Rosenthal MD     Onset Date/Surgical Date  12/27/18    Hand Dominance  Right    Next MD Visit  02/06/19    Prior Therapy  3 visits with HHPT       PROM   Right Knee Flexion  84                   [facility] Adult PT Treatment/Exercise - 01/15/19 1151      Self-Care   Self-Care  Other Self-Care Comments    Other Self-Care Comments   pt reported she hasn't been doing HEP as instructed (and only really walking and doing ankle pumps at home): educated on need to perform HEP 2-3x/day to maximize progress.  pt verbalized understanding      Knee/Hip Exercises: Stretches   Active Hamstring Stretch  Right;30 seconds;3 reps    Active Hamstring Stretch Limitations  supine with strap with overpressure at distal thigh      Knee/Hip Exercises: Aerobic   Recumbent Bike  8 min rocking for ROM (unable to peroform full revolutions yet)      Knee/Hip Exercises: Supine   Quad Sets  AROM;Right;20 reps    Quad Sets Limitations  5 sec hold; cues for quad activation    [employer]  Right;20 reps    Heel Slides  AAROM;Right;10 reps    Other Supine Knee/Hip Exercises  AA knee flexion with red physioball; PT overpressure at end range      Manual Therapy   Manual Therapy  Passive ROM    Passive ROM  Rt knee into extension and flexion, gentle patella mobs             PT Education - 01/15/19 1251    Education Details  see self care    Person(s) Educated  Patient    Methods  Explanation    Comprehension  Verbalized understanding       PT Short Term Goals - 01/09/19 1412      PT SHORT TERM GOAL #1   Title  Pt demonstrates full independence with initial HEP.    Time  2    Period  Weeks    Status  New    Target Date  01/23/19      PT SHORT TERM GOAL #2   Title  --     Baseline  --  Time  --    Period  --    Status  --        PT Long Term Goals - 01/09/19 1413      PT LONG TERM GOAL #1   Title  Pt is fully independent with advanced HEP.    Time  6    Period  Weeks    Status  New    Target Date  02/20/19      PT LONG TERM GOAL #2   Title  Pt demonstrates R knee AROM 0-115 for functional mobility and independence.    Baseline  01/09/19: ext -11, flex 57    Time  6    Period  Weeks    Status  New    Target Date  02/20/19      PT LONG TERM GOAL #3   Title  Pt demonstrates ability to perform steps independently for functional stair ambulation to get into home.    Time  6    Period  Weeks    Status  New    Target Date  02/20/19      PT LONG TERM GOAL #4   Title  Pt demonstrates ability to safely ambulate without and AD for functional gait and independence.    Baseline  01/09/19: enter/exit with 2W walker    Time  6    Period  Weeks    Status  New    Target Date  02/20/19            Plan - 01/15/19 1252    Clinical Impression Statement  Pt demonstrated improved PROM with knee flexion today, with expected increased pain with ROM exercises.  Pt reports not doing HEP at home, so reeducated on need to perform multiple times a day to maximize progress.  Will continue to benefit from PT to maximize function.    Personal Factors and Comorbidities  Age;Comorbidity 3+;Sex    Comorbidities  COPD, HTN, Osteopenia    Examination-Activity Limitations  Bathing;Bed Mobility;Locomotion Level;Stand;Stairs;Transfers;Dressing;Toileting    Examination-Participation Restrictions  Community Activity    Rehab Potential  Good    PT Frequency  3x / week    PT Duration  6 weeks    PT Treatment/Interventions  Cryotherapy;Gait training;Stair training;Neuromuscular re-education;Electrical Stimulation;Therapeutic exercise;Therapeutic activities;Balance training;Patient/family education;Manual techniques;Vasopneumatic Device;Passive range of motion    PT Next  Visit Plan  aggressive ROM, gait with cane, strengthening exercises    PT Home Exercise Plan  Access Code: [email or web address]    Consulted and Agree with Plan of Care  Patient       Patient will benefit from skilled therapeutic intervention in order to improve the following deficits and impairments:  Abnormal gait, Decreased range of motion, Difficulty walking, Decreased endurance, Decreased activity tolerance, Pain, Decreased strength, Decreased balance  Visit Diagnosis: Acute pain of right knee  Muscle weakness (generalized)  Abnormality of gait  Unsteadiness on feet     Problem List Patient Active Problem List   Diagnosis Date Noted  . Status post total right knee replacement 12/27/2018  . Unilateral primary osteoarthritis, right knee 11/27/2018  . Rib fracture 04/22/2018  . Pneumothorax on left 01/24/2016  . Abnormal CT of the chest   . Pulmonary embolism ([place])   . Interstitial lung disease ([place]) 10/08/2015  . Paroxysmal atrial fibrillation ([facility])   . Typical atrial flutter ([place])   . Chronic diastolic heart failure ([place]) 08/25/2014  . Lumbar stenosis 08/11/2014  . On amiodarone therapy 05/21/2013  . Hypercholesteremia   .  Essential hypertension   . Myalgia and myositis, unspecified   . COPD GOLD 0    . Overactive bladder         Laureen Abrahams, PT, DPT 01/15/19 12:55 PM     [facility] Physical Therapy [address] [place], [place], [address] Phone: [phone]   Fax:  [phone]  Name: Judy White MRN: [id number] Date of Birth: [birth year]

## 2019-01-16 ENCOUNTER — Encounter: Payer: Self-pay | Admitting: Physical Therapy

## 2019-01-16 ENCOUNTER — Ambulatory Visit (INDEPENDENT_AMBULATORY_CARE_PROVIDER_SITE_OTHER): Payer: Medicare Other | Admitting: Physical Therapy

## 2019-01-16 DIAGNOSIS — M6281 Muscle weakness (generalized): Secondary | ICD-10-CM

## 2019-01-16 DIAGNOSIS — R269 Unspecified abnormalities of gait and mobility: Secondary | ICD-10-CM | POA: Diagnosis not present

## 2019-01-16 DIAGNOSIS — M25561 Pain in right knee: Secondary | ICD-10-CM

## 2019-01-16 DIAGNOSIS — R2681 Unsteadiness on feet: Secondary | ICD-10-CM

## 2019-01-16 NOTE — Therapy (Signed)
[facility] Physical Therapy [address] [place], [place], [address] Phone: [phone]   Fax:  [phone]  Physical Therapy Treatment  Patient Details  Name: Judy White MRN: [id number] Date of Birth: 08-May-1940 Referring Provider (PT): Jean Rosenthal MD    Encounter Date: 01/16/2019  PT End of Session - 01/16/19 1141    Visit Number  4    Number of Visits  19    Date for PT Re-Evaluation  02/20/19    PT Start Time  1055    PT Stop Time  1138    PT Time Calculation (min)  43 min    Activity Tolerance  Patient tolerated treatment well;Patient limited by pain    Behavior During Therapy  [facility] for tasks assessed/performed       Past Medical History:  Diagnosis Date  . Agoraphobia   . Anxiety   . Arthritis    "normal for the age; nothing gives me problems" (01/24/2016)  . COPD (chronic obstructive pulmonary disease) ([place])    "mild" (01/24/2016)  . Depressive disorder, not elsewhere classified   . DVT (deep venous thrombosis) ([place]) 10/08/2015   "they said the blood clot went from one of my legs to my lung"  . History of hiatal hernia    "small one" (01/24/2016)  . Hypercholesteremia   . Hypertension    pt denies this hx on 01/24/2016  . Myalgia and myositis, unspecified   . Osteopenia   . Overactive bladder   . Persistent atrial fibrillation ([place])    a. s/p Watchman implant 12/2014  . Presence of Watchman left atrial appendage closure device   . Pulmonary embolism ([place]) 10/08/2015  . Sleep apnea    USES CPAP     Past Surgical History:  Procedure Laterality Date  . APPENDECTOMY  1983  . ATRIAL FIBRILLATION ABLATION  06/28/2017   PER PATIENT SUCCESSFUL ABLATION , CARDIO [place] , [place]  . BACK SURGERY    . BRONCHOSCOPY  01/24/2016  . CARDIAC CATHETERIZATION     >5 yrs  . CATARACT EXTRACTION W/ INTRAOCULAR LENS  IMPLANT, BILATERAL Bilateral   . CESAREAN SECTION  1967  . LEFT ATRIAL APPENDAGE OCCLUSION N/A  12/31/2014   Procedure: LEFT ATRIAL APPENDAGE OCCLUSION;  Surgeon: Thompson Grayer, MD;  Location: [place] [facility];  Service: Cardiovascular;  Laterality: N/A;  . POSTERIOR FUSION LUMBAR SPINE Bilateral 07/2014  . SHOULDER ARTHROSCOPY W/ ROTATOR CUFF REPAIR Left   . SHOULDER SURGERY Right    "tendon broke; couldn't be repaired"  . TEE WITHOUT CARDIOVERSION N/A 12/22/2014   Procedure: TRANSESOPHAGEAL ECHOCARDIOGRAM (TEE);  Surgeon: Pixie Casino, MD;  Location: [facility] ENDOSCOPY;  Service: Cardiovascular;  Laterality: N/A;  . TEE WITHOUT CARDIOVERSION N/A 02/16/2015   post Watchman TEE with good seal and no leak around device   . TONSILLECTOMY    . TOOTH EXTRACTION  05/2018  . TOTAL ABDOMINAL HYSTERECTOMY    . TOTAL KNEE ARTHROPLASTY Right 12/27/2018   Procedure: RIGHT TOTAL KNEE ARTHROPLASTY;  Surgeon: Mcarthur Rossetti, MD;  Location[facility];  Service: Orthopedics;  Laterality: Right;  Marland Kitchen VIDEO BRONCHOSCOPY Bilateral 01/24/2016   Procedure: VIDEO BRONCHOSCOPY WITH FLUORO;  Surgeon: Collene Gobble, MD;  Location: [place];  Service: Cardiopulmonary;  Laterality: Bilateral;  . WISDOM TOOTH EXTRACTION      There were no vitals filed for this visit.  Subjective Assessment - 01/16/19 1058    Subjective  "I cried a little last night.  This  just hurts."    Pertinent History  COPD, Hx of DVT/PE, HTN, osteopenia    Limitations  Standing;Walking    How long can you stand comfortably?  10-15 minutes    How long can you walk comfortably?  not actively walking right now aside from home ambulation    Patient Stated Goals  'get back to normal', mow grass, walking without pain, get rid of AD    Currently in Pain?  Yes    Pain Score  5     Pain Location  Knee    Pain Orientation  Right    Pain Descriptors / Indicators  Stabbing;Throbbing    Pain Type  Acute pain;Surgical pain    Pain Onset  1 to 4 weeks ago    Pain Frequency  Intermittent    Aggravating Factors   standing, walking, bending,  prolonged end range extension    Pain Relieving Factors  ice, rest, meds         [facility] PT Assessment - 01/16/19 1103      AROM   Right Knee Extension  0    Right Knee Flexion  81                   [facility] Adult PT Treatment/Exercise - 01/16/19 1103      Ambulation/Gait   Gait Comments  amb with SPC x 100' with cues for Rt knee flexion at toe off to improve clearance; good sequencing noted      Knee/Hip Exercises: Aerobic   Recumbent Bike  8 min rocking for ROM (unable to peroform full revolutions yet)      Knee/Hip Exercises: Standing   Functional Squat  10 reps    Functional Squat Limitations  cues for form; mini squat      Knee/Hip Exercises: Supine   Quad Sets  AROM;Right;20 reps    Quad Sets Limitations  5 sec hold; cues for quad activation    Heel Slides  AAROM;Right;10 reps    Straight Leg Raises  Right;10 reps   cues to decrease extensor lag   Other Supine Knee/Hip Exercises  AA knee flexion with red physioball; PT overpressure at end range      Modalities   Modalities  --   declined     Manual Therapy   Manual Therapy  Passive ROM    Passive ROM  Rt knee into extension and flexion, gentle patella mobs               PT Short Term Goals - 01/09/19 1412      PT SHORT TERM GOAL #1   Title  Pt demonstrates full independence with initial HEP.    Time  2    Period  Weeks    Status  New    Target Date  01/23/19      PT SHORT TERM GOAL #2   Title  --    Baseline  --    Time  --    Period  --    Status  --        PT Long Term Goals - 01/09/19 1413      PT LONG TERM GOAL #1   Title  Pt is fully independent with advanced HEP.    Time  6    Period  Weeks    Status  New    Target Date  02/20/19      PT LONG TERM GOAL #2   Title  Pt demonstrates R  knee AROM 0-115 for functional mobility and independence.    Baseline  01/09/19: ext -11, flex 57    Time  6    Period  Weeks    Status  New    Target Date  02/20/19      PT LONG TERM  GOAL #3   Title  Pt demonstrates ability to perform steps independently for functional stair ambulation to get into home.    Time  6    Period  Weeks    Status  New    Target Date  02/20/19      PT LONG TERM GOAL #4   Title  Pt demonstrates ability to safely ambulate without and AD for functional gait and independence.    Baseline  01/09/19: enter/exit with 2W walker    Time  6    Period  Weeks    Status  New    Target Date  02/20/19            Plan - 01/16/19 1142    Clinical Impression Statement  AROM improved today 0-81 degrees and slowly progressing well with therapy.  Will continue to benefit from PT to maximize function.    Personal Factors and Comorbidities  Age;Comorbidity 3+;Sex    Comorbidities  COPD, HTN, Osteopenia    Examination-Activity Limitations  Bathing;Bed Mobility;Locomotion Level;Stand;Stairs;Transfers;Dressing;Toileting    Examination-Participation Restrictions  Community Activity    Rehab Potential  Good    PT Frequency  3x / week    PT Duration  6 weeks    PT Treatment/Interventions  Cryotherapy;Gait training;Stair training;Neuromuscular re-education;Electrical Stimulation;Therapeutic exercise;Therapeutic activities;Balance training;Patient/family education;Manual techniques;Vasopneumatic Device;Passive range of motion    PT Next Visit Plan  aggressive ROM, gait with cane, strengthening exercises    PT Home Exercise Plan  Access Code: [email or web address]    Consulted and Agree with Plan of Care  Patient       Patient will benefit from skilled therapeutic intervention in order to improve the following deficits and impairments:  Abnormal gait, Decreased range of motion, Difficulty walking, Decreased endurance, Decreased activity tolerance, Pain, Decreased strength, Decreased balance  Visit Diagnosis: Acute pain of right knee  Muscle weakness (generalized)  Abnormality of gait  Unsteadiness on feet     Problem List Patient Active Problem List    Diagnosis Date Noted  . Status post total right knee replacement 12/27/2018  . Unilateral primary osteoarthritis, right knee 11/27/2018  . Rib fracture 04/22/2018  . Pneumothorax on left 01/24/2016  . Abnormal CT of the chest   . Pulmonary embolism ([place])   . Interstitial lung disease ([place]) 10/08/2015  . Paroxysmal atrial fibrillation ([facility])   . Typical atrial flutter ([place])   . Chronic diastolic heart failure ([place]) 08/25/2014  . Lumbar stenosis 08/11/2014  . On amiodarone therapy 05/21/2013  . Hypercholesteremia   . Essential hypertension   . Myalgia and myositis, unspecified   . COPD GOLD 0    . Overactive bladder       Laureen Abrahams, PT, DPT 01/16/19 11:43 AM     [facility] Physical Therapy [address] [place], [place], [address] Phone: [phone]   Fax:  [phone]  Name: SOMIA MAXIN MRN: [id number] Date of Birth: 12-May-1940

## 2019-01-18 ENCOUNTER — Other Ambulatory Visit: Payer: Self-pay | Admitting: Orthopaedic Surgery

## 2019-01-20 ENCOUNTER — Other Ambulatory Visit: Payer: Self-pay

## 2019-01-20 ENCOUNTER — Other Ambulatory Visit: Payer: Self-pay | Admitting: Orthopaedic Surgery

## 2019-01-20 ENCOUNTER — Telehealth: Payer: Self-pay | Admitting: *Deleted

## 2019-01-20 ENCOUNTER — Ambulatory Visit (INDEPENDENT_AMBULATORY_CARE_PROVIDER_SITE_OTHER): Payer: Medicare Other | Admitting: Physical Therapy

## 2019-01-20 DIAGNOSIS — M6281 Muscle weakness (generalized): Secondary | ICD-10-CM | POA: Diagnosis not present

## 2019-01-20 DIAGNOSIS — R269 Unspecified abnormalities of gait and mobility: Secondary | ICD-10-CM

## 2019-01-20 DIAGNOSIS — R2681 Unsteadiness on feet: Secondary | ICD-10-CM

## 2019-01-20 DIAGNOSIS — M25561 Pain in right knee: Secondary | ICD-10-CM

## 2019-01-20 NOTE — Telephone Encounter (Signed)
Please advise 

## 2019-01-20 NOTE — Telephone Encounter (Signed)
Received message from patient requesting refill of her pain medication. I think her pharmacy also sent a request here, but I don't see where you've refilled yet. Just wanted to make sure you received request before the week gets chaotic. Thanks.

## 2019-01-20 NOTE — Telephone Encounter (Signed)
I sent some in 

## 2019-01-20 NOTE — Therapy (Signed)
[facility] Physical Therapy [address] [place], [place], [address] Phone: [phone]   Fax:  [phone]  Physical Therapy Treatment  Patient Details  Name: Judy White MRN: [id number] Date of Birth: August 18, 1940 Referring Provider (PT): Jean Rosenthal MD    Encounter Date: 01/20/2019  PT End of Session - 01/20/19 1132    Visit Number  5    Number of Visits  19    Date for PT Re-Evaluation  02/20/19    PT Start Time  1100    PT Stop Time  1145    PT Time Calculation (min)  45 min    Activity Tolerance  Patient tolerated treatment well;Patient limited by pain    Behavior During Therapy  [facility] for tasks assessed/performed       Past Medical History:  Diagnosis Date  . Agoraphobia   . Anxiety   . Arthritis    "normal for the age; nothing gives me problems" (01/24/2016)  . COPD (chronic obstructive pulmonary disease) ([place])    "mild" (01/24/2016)  . Depressive disorder, not elsewhere classified   . DVT (deep venous thrombosis) ([place]) 10/08/2015   "they said the blood clot went from one of my legs to my lung"  . History of hiatal hernia    "small one" (01/24/2016)  . Hypercholesteremia   . Hypertension    pt denies this hx on 01/24/2016  . Myalgia and myositis, unspecified   . Osteopenia   . Overactive bladder   . Persistent atrial fibrillation ([place])    a. s/p Watchman implant 12/2014  . Presence of Watchman left atrial appendage closure device   . Pulmonary embolism ([place]) 10/08/2015  . Sleep apnea    USES CPAP     Past Surgical History:  Procedure Laterality Date  . APPENDECTOMY  1983  . ATRIAL FIBRILLATION ABLATION  06/28/2017   PER PATIENT SUCCESSFUL ABLATION , CARDIO [place] , [place]  . BACK SURGERY    . BRONCHOSCOPY  01/24/2016  . CARDIAC CATHETERIZATION     >5 yrs  . CATARACT EXTRACTION W/ INTRAOCULAR LENS  IMPLANT, BILATERAL Bilateral   . CESAREAN SECTION  1967  . LEFT ATRIAL APPENDAGE OCCLUSION N/A  12/31/2014   Procedure: LEFT ATRIAL APPENDAGE OCCLUSION;  Surgeon: Thompson Grayer, MD;  Location: [place] CV [facility];  Service: Cardiovascular;  Laterality: N/A;  . POSTERIOR FUSION LUMBAR SPINE Bilateral 07/2014  . SHOULDER ARTHROSCOPY W/ ROTATOR CUFF REPAIR Left   . SHOULDER SURGERY Right    "tendon broke; couldn't be repaired"  . TEE WITHOUT CARDIOVERSION N/A 12/22/2014   Procedure: TRANSESOPHAGEAL ECHOCARDIOGRAM (TEE);  Surgeon: Pixie Casino, MD;  Location: [facility] ENDOSCOPY;  Service: Cardiovascular;  Laterality: N/A;  . TEE WITHOUT CARDIOVERSION N/A 02/16/2015   post Watchman TEE with good seal and no leak around device   . TONSILLECTOMY    . TOOTH EXTRACTION  05/2018  . TOTAL ABDOMINAL HYSTERECTOMY    . TOTAL KNEE ARTHROPLASTY Right 12/27/2018   Procedure: RIGHT TOTAL KNEE ARTHROPLASTY;  Surgeon: Mcarthur Rossetti, MD;  Location: [facility];  Service: Orthopedics;  Laterality: Right;  Marland Kitchen VIDEO BRONCHOSCOPY Bilateral 01/24/2016   Procedure: VIDEO BRONCHOSCOPY WITH FLUORO;  Surgeon: Collene Gobble, MD;  Location: [place];  Service: Cardiopulmonary;  Laterality: Bilateral;  . WISDOM TOOTH EXTRACTION      There were no vitals filed for this visit.  Subjective Assessment - 01/20/19 1147    Subjective  It hurts but ill get through it  Pertinent History  COPD, Hx of DVT/PE, HTN, osteopenia    Limitations  Standing;Walking    How long can you stand comfortably?  10-15 minutes    How long can you walk comfortably?  not actively walking right now aside from home ambulation    Patient Stated Goals  'get back to normal', mow grass, walking without pain, get rid of AD    Currently in Pain?  Yes    Pain Score  6     Pain Location  Knee    Pain Orientation  Right    Pain Type  Surgical pain    Pain Onset  1 to 4 weeks ago         [facility] PT Assessment - 01/20/19 0001      Assessment   Medical Diagnosis  R TKR    Referring Provider (PT)  Jean Rosenthal MD     Onset  Date/Surgical Date  12/27/18      PROM   Right Knee Flexion  90                   [facility] Adult PT Treatment/Exercise - 01/20/19 0001      Ambulation/Gait   Gait Comments  amb with SPC x 100' X 2with cues for Rt knee flexion at toe off to improve clearance; good sequencing noted      Knee/Hip Exercises: Stretches   Active Hamstring Stretch  Right;2 reps;30 seconds    Active Hamstring Stretch Limitations  seated    Quad Stretch  Right;2 reps;30 seconds    Quad Stretch Limitations  supine with strap off side of bed    Other Knee/Hip Stretches  heelslides AAROM 5 sec X 15 with strap    Other Knee/Hip Stretches  supine heel prop for knee extension stretching 3 min hold with 5 lb weight on knee      Knee/Hip Exercises: Aerobic   Recumbent Bike  9 min rocking for ROM (unable to peroform full revolutions yet)      Knee/Hip Exercises: Standing   Functional Squat  15 reps    Functional Squat Limitations  cues for form; mini squat      Knee/Hip Exercises: Supine   Straight Leg Raises  Right;15 reps      Knee/Hip Exercises: Sidelying   Clams  Rt X 15 reps      Manual Therapy   Passive ROM  Rt knee into extension and flexion, gentle patella mobs               PT Short Term Goals - 01/09/19 1412      PT SHORT TERM GOAL #1   Title  Pt demonstrates full independence with initial HEP.    Time  2    Period  Weeks    Status  New    Target Date  01/23/19      PT SHORT TERM GOAL #2   Title  --    Baseline  --    Time  --    Period  --    Status  --        PT Long Term Goals - 01/09/19 1413      PT LONG TERM GOAL #1   Title  Pt is fully independent with advanced HEP.    Time  6    Period  Weeks    Status  New    Target Date  02/20/19      PT LONG TERM GOAL #2  Title  Pt demonstrates R knee AROM 0-115 for functional mobility and independence.    Baseline  01/09/19: ext -11, flex 57    Time  6    Period  Weeks    Status  New    Target Date  02/20/19       PT LONG TERM GOAL #3   Title  Pt demonstrates ability to perform steps independently for functional stair ambulation to get into home.    Time  6    Period  Weeks    Status  New    Target Date  02/20/19      PT LONG TERM GOAL #4   Title  Pt demonstrates ability to safely ambulate without and AD for functional gait and independence.    Baseline  01/09/19: enter/exit with 2W walker    Time  6    Period  Weeks    Status  New    Target Date  02/20/19            Plan - 01/20/19 1133    Clinical Impression Statement  PROM improved to 90 deg of Rt knee flexion. Continued with gait training with SPC and gentle progression of her strength and ROM as tolerated. She is making slow but steady progress. Continue POC    Personal Factors and Comorbidities  Age;Comorbidity 3+;Sex    Comorbidities  COPD, HTN, Osteopenia    Examination-Activity Limitations  Bathing;Bed Mobility;Locomotion Level;Stand;Stairs;Transfers;Dressing;Toileting    Examination-Participation Restrictions  Community Activity    Rehab Potential  Good    PT Frequency  3x / week    PT Duration  6 weeks    PT Treatment/Interventions  Cryotherapy;Gait training;Stair training;Neuromuscular re-education;Electrical Stimulation;Therapeutic exercise;Therapeutic activities;Balance training;Patient/family education;Manual techniques;Vasopneumatic Device;Passive range of motion    PT Next Visit Plan  aggressive ROM, gait with cane, strengthening exercises    PT Home Exercise Plan  Access Code: [email or web address]    Consulted and Agree with Plan of Care  Patient       Patient will benefit from skilled therapeutic intervention in order to improve the following deficits and impairments:  Abnormal gait, Decreased range of motion, Difficulty walking, Decreased endurance, Decreased activity tolerance, Pain, Decreased strength, Decreased balance  Visit Diagnosis: Acute pain of right knee  Muscle weakness (generalized)  Abnormality of  gait  Unsteadiness on feet     Problem List Patient Active Problem List   Diagnosis Date Noted  . Status post total right knee replacement 12/27/2018  . Unilateral primary osteoarthritis, right knee 11/27/2018  . Rib fracture 04/22/2018  . Pneumothorax on left 01/24/2016  . Abnormal CT of the chest   . Pulmonary embolism ([place])   . Interstitial lung disease ([place]) 10/08/2015  . Paroxysmal atrial fibrillation ([facility])   . Typical atrial flutter ([place])   . Chronic diastolic heart failure ([place]) 08/25/2014  . Lumbar stenosis 08/11/2014  . On amiodarone therapy 05/21/2013  . Hypercholesteremia   . Essential hypertension   . Myalgia and myositis, unspecified   . COPD GOLD 0    . Overactive bladder     Silvestre Mesi 01/20/2019, 11:48 AM  [facility] Physical Therapy [address] [place], [place], [address] Phone: [phone]   Fax:  [phone]  Name: LASHONIA HENJUM MRN: [id number] Date of Birth: Oct 17, 1940

## 2019-01-21 ENCOUNTER — Ambulatory Visit (INDEPENDENT_AMBULATORY_CARE_PROVIDER_SITE_OTHER): Payer: Medicare Other | Admitting: Physical Therapy

## 2019-01-21 ENCOUNTER — Encounter: Payer: Self-pay | Admitting: Physical Therapy

## 2019-01-21 DIAGNOSIS — R2681 Unsteadiness on feet: Secondary | ICD-10-CM

## 2019-01-21 DIAGNOSIS — M6281 Muscle weakness (generalized): Secondary | ICD-10-CM | POA: Diagnosis not present

## 2019-01-21 DIAGNOSIS — M25561 Pain in right knee: Secondary | ICD-10-CM | POA: Diagnosis not present

## 2019-01-21 DIAGNOSIS — R269 Unspecified abnormalities of gait and mobility: Secondary | ICD-10-CM | POA: Diagnosis not present

## 2019-01-21 NOTE — Therapy (Signed)
[facility] Physical Therapy [address] [place], [place], [address] Phone: [phone]   Fax:  [phone]  Physical Therapy Treatment  Patient Details  Name: Judy White MRN: [id number] Date of Birth: 1940-11-02 Referring Provider (PT): Jean Rosenthal MD    Encounter Date: 01/21/2019  PT End of Session - 01/21/19 1129    Visit Number  6    Number of Visits  19    Date for PT Re-Evaluation  02/20/19    PT Start Time  [id number]    PT Stop Time  1132    PT Time Calculation (min)  52 min    Activity Tolerance  Patient tolerated treatment well;Patient limited by pain    Behavior During Therapy  [facility] for tasks assessed/performed       Past Medical History:  Diagnosis Date  . Agoraphobia   . Anxiety   . Arthritis    "normal for the age; nothing gives me problems" (01/24/2016)  . COPD (chronic obstructive pulmonary disease) ([place])    "mild" (01/24/2016)  . Depressive disorder, not elsewhere classified   . DVT (deep venous thrombosis) ([place]) 10/08/2015   "they said the blood clot went from one of my legs to my lung"  . History of hiatal hernia    "small one" (01/24/2016)  . Hypercholesteremia   . Hypertension    pt denies this hx on 01/24/2016  . Myalgia and myositis, unspecified   . Osteopenia   . Overactive bladder   . Persistent atrial fibrillation ([place])    a. s/p Watchman implant 12/2014  . Presence of Watchman left atrial appendage closure device   . Pulmonary embolism ([place]) 10/08/2015  . Sleep apnea    USES CPAP     Past Surgical History:  Procedure Laterality Date  . APPENDECTOMY  1983  . ATRIAL FIBRILLATION ABLATION  06/28/2017   PER PATIENT SUCCESSFUL ABLATION , CARDIO [place] , [place]  . BACK SURGERY    . BRONCHOSCOPY  01/24/2016  . CARDIAC CATHETERIZATION     >5 yrs  . CATARACT EXTRACTION W/ INTRAOCULAR LENS  IMPLANT, BILATERAL Bilateral   . CESAREAN SECTION  1967  . LEFT ATRIAL APPENDAGE OCCLUSION N/A  12/31/2014   Procedure: LEFT ATRIAL APPENDAGE OCCLUSION;  Surgeon: Thompson Grayer, MD;  Location: [place] [facility];  Service: Cardiovascular;  Laterality: N/A;  . POSTERIOR FUSION LUMBAR SPINE Bilateral 07/2014  . SHOULDER ARTHROSCOPY W/ ROTATOR CUFF REPAIR Left   . SHOULDER SURGERY Right    "tendon broke; couldn't be repaired"  . TEE WITHOUT CARDIOVERSION N/A 12/22/2014   Procedure: TRANSESOPHAGEAL ECHOCARDIOGRAM (TEE);  Surgeon: Pixie Casino, MD;  Location: [facility] ENDOSCOPY;  Service: Cardiovascular;  Laterality: N/A;  . TEE WITHOUT CARDIOVERSION N/A 02/16/2015   post Watchman TEE with good seal and no leak around device   . TONSILLECTOMY    . TOOTH EXTRACTION  05/2018  . TOTAL ABDOMINAL HYSTERECTOMY    . TOTAL KNEE ARTHROPLASTY Right 12/27/2018   Procedure: RIGHT TOTAL KNEE ARTHROPLASTY;  Surgeon: Mcarthur Rossetti, MD;  Location: [facility];  Service: Orthopedics;  Laterality: Right;  Marland Kitchen VIDEO BRONCHOSCOPY Bilateral 01/24/2016   Procedure: VIDEO BRONCHOSCOPY WITH FLUORO;  Surgeon: Collene Gobble, MD;  Location: [place];  Service: Cardiopulmonary;  Laterality: Bilateral;  . WISDOM TOOTH EXTRACTION      There were no vitals filed for this visit.  Subjective Assessment - 01/21/19 1045    Subjective  doing well; arrived early today  Pertinent History  COPD, Hx of DVT/PE, HTN, osteopenia    Limitations  Standing;Walking    How long can you stand comfortably?  10-15 minutes    How long can you walk comfortably?  not actively walking right now aside from home ambulation    Patient Stated Goals  'get back to normal', mow grass, walking without pain, get rid of AD    Currently in Pain?  Yes    Pain Score  3     Pain Location  Knee    Pain Orientation  Right    Pain Descriptors / Indicators  Throbbing;Stabbing    Pain Type  Surgical pain;Acute pain    Pain Onset  1 to 4 weeks ago    Pain Frequency  Intermittent    Aggravating Factors   standing, walking, bending, proloned end  range extension                       [facility] Adult PT Treatment/Exercise - 01/21/19 1046      Knee/Hip Exercises: Aerobic   Recumbent Bike  9 min rocking for ROM (unable to peroform full revolutions yet)   seat 8     Knee/Hip Exercises: Seated   Heel Slides  Right;5 reps   with scoot forward for increased ROM     Knee/Hip Exercises: Supine   Quad Sets  AROM;Right;20 reps    Short Arc [employer]  Right;20 reps    Short Arc Quad Sets Limitations  2#    Straight Leg Raises  Right;20 reps    Straight Leg Raises Limitations  2#    Other Supine Knee/Hip Exercises  AA knee flexion with red physioball; PT overpressure at end range      Modalities   Modalities  Vasopneumatic      Vasopneumatic   Number Minutes Vasopneumatic   10 minutes    Vasopnuematic Location   Knee    Vasopneumatic Pressure  Low    Vasopneumatic Temperature   34 deg      Manual Therapy   Passive ROM  Rt knee into extension and flexion, gentle patella mobs               PT Short Term Goals - 01/09/19 1412      PT SHORT TERM GOAL #1   Title  Pt demonstrates full independence with initial HEP.    Time  2    Period  Weeks    Status  New    Target Date  01/23/19      PT SHORT TERM GOAL #2   Title  --    Baseline  --    Time  --    Period  --    Status  --        PT Long Term Goals - 01/09/19 1413      PT LONG TERM GOAL #1   Title  Pt is fully independent with advanced HEP.    Time  6    Period  Weeks    Status  New    Target Date  02/20/19      PT LONG TERM GOAL #2   Title  Pt demonstrates R knee AROM 0-115 for functional mobility and independence.    Baseline  01/09/19: ext -11, flex 57    Time  6    Period  Weeks    Status  New    Target Date  02/20/19      PT  LONG TERM GOAL #3   Title  Pt demonstrates ability to perform steps independently for functional stair ambulation to get into home.    Time  6    Period  Weeks    Status  New    Target Date  02/20/19       PT LONG TERM GOAL #4   Title  Pt demonstrates ability to safely ambulate without and AD for functional gait and independence.    Baseline  01/09/19: enter/exit with 2W walker    Time  6    Period  Weeks    Status  New    Target Date  02/20/19            Plan - 01/21/19 1130    Clinical Impression Statement  Pt tolerated session well today with slow gains with motion and functional mobility.  Continued to reinforce need to perform HEP at home as instructed to maximize progress.  Will continue to benfefit to maximize function.    Personal Factors and Comorbidities  Age;Comorbidity 3+;Sex    Comorbidities  COPD, HTN, Osteopenia    Examination-Activity Limitations  Bathing;Bed Mobility;Locomotion Level;Stand;Stairs;Transfers;Dressing;Toileting    Examination-Participation Restrictions  Community Activity    Rehab Potential  Good    PT Frequency  3x / week    PT Duration  6 weeks    PT Treatment/Interventions  Cryotherapy;Gait training;Stair training;Neuromuscular re-education;Electrical Stimulation;Therapeutic exercise;Therapeutic activities;Balance training;Patient/family education;Manual techniques;Vasopneumatic Device;Passive range of motion    PT Next Visit Plan  aggressive ROM, gait with cane, strengthening exercises    PT Home Exercise Plan  Access Code: [email or web address]    Consulted and Agree with Plan of Care  Patient       Patient will benefit from skilled therapeutic intervention in order to improve the following deficits and impairments:  Abnormal gait, Decreased range of motion, Difficulty walking, Decreased endurance, Decreased activity tolerance, Pain, Decreased strength, Decreased balance  Visit Diagnosis: Acute pain of right knee  Muscle weakness (generalized)  Abnormality of gait  Unsteadiness on feet     Problem List Patient Active Problem List   Diagnosis Date Noted  . Status post total right knee replacement 12/27/2018  . Unilateral primary osteoarthritis,  right knee 11/27/2018  . Rib fracture 04/22/2018  . Pneumothorax on left 01/24/2016  . Abnormal CT of the chest   . Pulmonary embolism ([place])   . Interstitial lung disease ([place]) 10/08/2015  . Paroxysmal atrial fibrillation ([facility])   . Typical atrial flutter ([place])   . Chronic diastolic heart failure ([place]) 08/25/2014  . Lumbar stenosis 08/11/2014  . On amiodarone therapy 05/21/2013  . Hypercholesteremia   . Essential hypertension   . Myalgia and myositis, unspecified   . COPD GOLD 0    . Overactive bladder       Laureen Abrahams, PT, DPT 01/21/19 11:35 AM    [facility] Physical Therapy [address] [place], [place], [address] Phone: [phone]   Fax:  [phone]  Name: Judy White MRN: [id number] Date of Birth: [birth year]

## 2019-01-22 ENCOUNTER — Encounter: Payer: Medicare Other | Admitting: Physical Therapy

## 2019-01-27 ENCOUNTER — Other Ambulatory Visit: Payer: Self-pay

## 2019-01-27 ENCOUNTER — Ambulatory Visit (INDEPENDENT_AMBULATORY_CARE_PROVIDER_SITE_OTHER): Payer: Medicare Other | Admitting: Physical Therapy

## 2019-01-27 DIAGNOSIS — R269 Unspecified abnormalities of gait and mobility: Secondary | ICD-10-CM | POA: Diagnosis not present

## 2019-01-27 DIAGNOSIS — R2681 Unsteadiness on feet: Secondary | ICD-10-CM

## 2019-01-27 DIAGNOSIS — M6281 Muscle weakness (generalized): Secondary | ICD-10-CM

## 2019-01-27 DIAGNOSIS — M25561 Pain in right knee: Secondary | ICD-10-CM

## 2019-01-27 NOTE — Therapy (Signed)
[facility] Physical Therapy [address] [place], [place], [address] Phone: [phone]   Fax:  [phone]  Physical Therapy Treatment  Patient Details  Name: Judy White MRN: [id number] Date of Birth: 07-Dec-1940 Referring Provider (PT): Jean Rosenthal MD    Encounter Date: 01/27/2019  PT End of Session - 01/27/19 1100    Visit Number  7    Number of Visits  19    Date for PT Re-Evaluation  02/20/19    PT Start Time  1050    PT Stop Time  1145    PT Time Calculation (min)  55 min    Activity Tolerance  Patient tolerated treatment well;Patient limited by pain    Behavior During Therapy  [facility] for tasks assessed/performed       Past Medical History:  Diagnosis Date  . Agoraphobia   . Anxiety   . Arthritis    "normal for the age; nothing gives me problems" (01/24/2016)  . COPD (chronic obstructive pulmonary disease) ([place])    "mild" (01/24/2016)  . Depressive disorder, not elsewhere classified   . DVT (deep venous thrombosis) ([place]) 10/08/2015   "they said the blood clot went from one of my legs to my lung"  . History of hiatal hernia    "small one" (01/24/2016)  . Hypercholesteremia   . Hypertension    pt denies this hx on 01/24/2016  . Myalgia and myositis, unspecified   . Osteopenia   . Overactive bladder   . Persistent atrial fibrillation ([place])    a. s/p Watchman implant 12/2014  . Presence of Watchman left atrial appendage closure device   . Pulmonary embolism ([place]) 10/08/2015  . Sleep apnea    USES CPAP     Past Surgical History:  Procedure Laterality Date  . APPENDECTOMY  1983  . ATRIAL FIBRILLATION ABLATION  06/28/2017   PER PATIENT SUCCESSFUL ABLATION , CARDIO [place] , [place]  . BACK SURGERY    . BRONCHOSCOPY  01/24/2016  . CARDIAC CATHETERIZATION     >5 yrs  . CATARACT EXTRACTION W/ INTRAOCULAR LENS  IMPLANT, BILATERAL Bilateral   . CESAREAN SECTION  1967  . LEFT ATRIAL APPENDAGE OCCLUSION N/A  12/31/2014   Procedure: LEFT ATRIAL APPENDAGE OCCLUSION;  Surgeon: Thompson Grayer, MD;  Location: [place] [facility];  Service: Cardiovascular;  Laterality: N/A;  . POSTERIOR FUSION LUMBAR SPINE Bilateral 07/2014  . SHOULDER ARTHROSCOPY W/ ROTATOR CUFF REPAIR Left   . SHOULDER SURGERY Right    "tendon broke; couldn't be repaired"  . TEE WITHOUT CARDIOVERSION N/A 12/22/2014   Procedure: TRANSESOPHAGEAL ECHOCARDIOGRAM (TEE);  Surgeon: Pixie Casino, MD;  Location: [facility] ENDOSCOPY;  Service: Cardiovascular;  Laterality: N/A;  . TEE WITHOUT CARDIOVERSION N/A 02/16/2015   post Watchman TEE with good seal and no leak around device   . TONSILLECTOMY    . TOOTH EXTRACTION  05/2018  . TOTAL ABDOMINAL HYSTERECTOMY    . TOTAL KNEE ARTHROPLASTY Right 12/27/2018   Procedure: RIGHT TOTAL KNEE ARTHROPLASTY;  Surgeon: Mcarthur Rossetti, MD;  Location: [facility];  Service: Orthopedics;  Laterality: Right;  Marland Kitchen VIDEO BRONCHOSCOPY Bilateral 01/24/2016   Procedure: VIDEO BRONCHOSCOPY WITH FLUORO;  Surgeon: Collene Gobble, MD;  Location: [facility];  Service: Cardiopulmonary;  Laterality: Bilateral;  . WISDOM TOOTH EXTRACTION      There were no vitals filed for this visit.  Subjective Assessment - 01/27/19 1059    Subjective  some itching in her Rt knee, doing okay  I guess for an old woman    Pertinent History  COPD, Hx of DVT/PE, HTN, osteopenia    Limitations  Standing;Walking    How long can you stand comfortably?  10-15 minutes    How long can you walk comfortably?  not actively walking right now aside from home ambulation    Patient Stated Goals  'get back to normal', mow grass, walking without pain, get rid of AD    Currently in Pain?  Yes    Pain Score  3     Pain Location  Knee    Pain Orientation  Right    Pain Onset  1 to 4 weeks ago         [facility] PT Assessment - 01/27/19 0001      AROM   Right Knee Extension  -10    Right Knee Flexion  81      PROM   Right Knee Extension  -5     Right Knee Flexion  90                   [facility] Adult PT Treatment/Exercise - 01/27/19 0001      High Level Balance   High Level Balance Comments  march walking and retro walking in bars for dynamic balance, PRN UE support, up/down X 3 reps      Knee/Hip Exercises: Stretches   Active Hamstring Stretch  Right;2 reps;30 seconds    Active Hamstring Stretch Limitations  supine with strap    Knee: Self-Stretch Limitations  Rt foot on 6 inch block lunge stretch 10 sec X 10 with UE support at //bars      Knee/Hip Exercises: Aerobic   Recumbent Bike  10 min rocking then after 5 min able to push seat back and perform full revolutions about 5 reps then started to have pain with this so moved seat closer and rocked for ROM/stretching      Knee/Hip Exercises: Standing   Lateral Step Up  Right;10 reps;Hand Hold: 2;Step Height: 4"    Forward Step Up  Right;10 reps;Hand Hold: 2;Step Height: 4"      Knee/Hip Exercises: Seated   Long Arc Quad  Right;20 reps    Long Arc Quad Weight  2 lbs.      Knee/Hip Exercises: Supine   Short Arc Quad Sets  Right;20 reps    Short Arc Quad Sets Limitations  2#    Straight Leg Raises  Right;20 reps    Straight Leg Raises Limitations  2#    Other Supine Knee/Hip Exercises  AA knee flexion with strap 5 sec  X20 reps      Vasopneumatic   Number Minutes Vasopneumatic   10 minutes    Vasopnuematic Location   Knee    Vasopneumatic Pressure  Medium    Vasopneumatic Temperature   34 deg      Manual Therapy   Passive ROM  Rt knee into extension and flexion, gentle patella mobs               PT Short Term Goals - 01/27/19 1138      PT SHORT TERM GOAL #1   Title  Pt demonstrates full independence with initial HEP.    Baseline  relays compliance and does not have questions    Time  2    Period  Weeks    Status  Achieved    Target Date  01/23/19        PT Long Term  Goals - 01/27/19 1139      PT LONG TERM GOAL #1   Title  Pt is fully  independent with advanced HEP.    Time  6    Period  Weeks    Status  On-going      PT LONG TERM GOAL #2   Title  Pt demonstrates R knee AROM 0-115 for functional mobility and independence.    Baseline  01/27/19: ext -10, flex 80    Time  6    Period  Weeks    Status  New      PT LONG TERM GOAL #3   Title  Pt demonstrates ability to perform steps independently for functional stair ambulation to get into home.    Time  6    Period  Weeks    Status  On-going      PT LONG TERM GOAL #4   Title  Pt demonstrates ability to safely ambulate without and AD for functional gait and independence.    Baseline  01/09/19: enter/exit with SPC    Time  6    Period  Weeks    Status  On-going            Plan - 01/27/19 1136    Clinical Impression Statement  She continues to be limited with ROM but was able to show some improvements with this today with updated measurments and able to perform full revolution on bike (seat adjusted back some for her). She was then able to progress into closed chain strength adding in step ups with good tolerance. Dynamic balance progressed some adding in march walking and retro walking in bars. Continued with vaso post tx to control pain, soreness, and edema. PT will continue to proress as able.    Personal Factors and Comorbidities  Age;Comorbidity 3+;Sex    Comorbidities  COPD, HTN, Osteopenia    Examination-Activity Limitations  Bathing;Bed Mobility;Locomotion Level;Stand;Stairs;Transfers;Dressing;Toileting    Examination-Participation Restrictions  Community Activity    Rehab Potential  Good    PT Frequency  3x / week    PT Duration  6 weeks    PT Treatment/Interventions  Cryotherapy;Gait training;Stair training;Neuromuscular re-education;Electrical Stimulation;Therapeutic exercise;Therapeutic activities;Balance training;Patient/family education;Manual techniques;Vasopneumatic Device;Passive range of motion    PT Next Visit Plan  aggressive ROM, gait with  cane, strengthening exercises    PT Home Exercise Plan  Access Code: [email or web address]    Consulted and Agree with Plan of Care  Patient       Patient will benefit from skilled therapeutic intervention in order to improve the following deficits and impairments:  Abnormal gait, Decreased range of motion, Difficulty walking, Decreased endurance, Decreased activity tolerance, Pain, Decreased strength, Decreased balance  Visit Diagnosis: Acute pain of right knee  Muscle weakness (generalized)  Abnormality of gait  Unsteadiness on feet     Problem List Patient Active Problem List   Diagnosis Date Noted  . Status post total right knee replacement 12/27/2018  . Unilateral primary osteoarthritis, right knee 11/27/2018  . Rib fracture 04/22/2018  . Pneumothorax on left 01/24/2016  . Abnormal CT of the chest   . Pulmonary embolism ([place])   . Interstitial lung disease ([place]) 10/08/2015  . Paroxysmal atrial fibrillation ([facility])   . Typical atrial flutter ([place])   . Chronic diastolic heart failure ([place]) 08/25/2014  . Lumbar stenosis 08/11/2014  . On amiodarone therapy 05/21/2013  . Hypercholesteremia   . Essential hypertension   . Myalgia and myositis, unspecified   . COPD GOLD 0    .  Overactive bladder     Silvestre Mesi 01/27/2019, 11:41 AM  [facility] Physical Therapy [address] [place], [place], [address] Phone: [phone]   Fax:  [phone]  Name: Judy White MRN: [id number] Date of Birth: 1940/10/09

## 2019-01-29 ENCOUNTER — Other Ambulatory Visit: Payer: Self-pay

## 2019-01-29 ENCOUNTER — Ambulatory Visit (INDEPENDENT_AMBULATORY_CARE_PROVIDER_SITE_OTHER): Payer: Medicare Other | Admitting: Physical Therapy

## 2019-01-29 ENCOUNTER — Telehealth: Payer: Self-pay | Admitting: *Deleted

## 2019-01-29 DIAGNOSIS — R2681 Unsteadiness on feet: Secondary | ICD-10-CM

## 2019-01-29 DIAGNOSIS — R269 Unspecified abnormalities of gait and mobility: Secondary | ICD-10-CM

## 2019-01-29 DIAGNOSIS — M25561 Pain in right knee: Secondary | ICD-10-CM | POA: Diagnosis not present

## 2019-01-29 DIAGNOSIS — M6281 Muscle weakness (generalized): Secondary | ICD-10-CM

## 2019-01-29 NOTE — Therapy (Signed)
[facility] Physical Therapy [address] [place], [place], [address] Phone: [phone]   Fax:  [phone]  Physical Therapy Treatment  Patient Details  Name: Judy White MRN: [id number] Date of Birth: 08/18/1940 Referring Provider (PT): Jean Rosenthal MD    Encounter Date: 01/29/2019  PT End of Session - 01/29/19 1148    Visit Number  8    Number of Visits  19    Date for PT Re-Evaluation  02/20/19    PT Start Time  1100    PT Stop Time  1145    PT Time Calculation (min)  45 min    Activity Tolerance  Patient tolerated treatment well    Behavior During Therapy  [facility] for tasks assessed/performed       Past Medical History:  Diagnosis Date  . Agoraphobia   . Anxiety   . Arthritis    "normal for the age; nothing gives me problems" (01/24/2016)  . COPD (chronic obstructive pulmonary disease) ([place])    "mild" (01/24/2016)  . Depressive disorder, not elsewhere classified   . DVT (deep venous thrombosis) ([place]) 10/08/2015   "they said the blood clot went from one of my legs to my lung"  . History of hiatal hernia    "small one" (01/24/2016)  . Hypercholesteremia   . Hypertension    pt denies this hx on 01/24/2016  . Myalgia and myositis, unspecified   . Osteopenia   . Overactive bladder   . Persistent atrial fibrillation ([place])    a. s/p Watchman implant 12/2014  . Presence of Watchman left atrial appendage closure device   . Pulmonary embolism ([place]) 10/08/2015  . Sleep apnea    USES CPAP     Past Surgical History:  Procedure Laterality Date  . APPENDECTOMY  1983  . ATRIAL FIBRILLATION ABLATION  06/28/2017   PER PATIENT SUCCESSFUL ABLATION , CARDIO [place] , [place]  . BACK SURGERY    . BRONCHOSCOPY  01/24/2016  . CARDIAC CATHETERIZATION     >5 yrs  . CATARACT EXTRACTION W/ INTRAOCULAR LENS  IMPLANT, BILATERAL Bilateral   . CESAREAN SECTION  1967  . LEFT ATRIAL APPENDAGE OCCLUSION N/A 12/31/2014   Procedure:  LEFT ATRIAL APPENDAGE OCCLUSION;  Surgeon: Thompson Grayer, MD;  Location: [facility];  Service: Cardiovascular;  Laterality: N/A;  . POSTERIOR FUSION LUMBAR SPINE Bilateral 07/2014  . SHOULDER ARTHROSCOPY W/ ROTATOR CUFF REPAIR Left   . SHOULDER SURGERY Right    "tendon broke; couldn't be repaired"  . TEE WITHOUT CARDIOVERSION N/A 12/22/2014   Procedure: TRANSESOPHAGEAL ECHOCARDIOGRAM (TEE);  Surgeon: Pixie Casino, MD;  Location: [facility] ENDOSCOPY;  Service: Cardiovascular;  Laterality: N/A;  . TEE WITHOUT CARDIOVERSION N/A 02/16/2015   post Watchman TEE with good seal and no leak around device   . TONSILLECTOMY    . TOOTH EXTRACTION  05/2018  . TOTAL ABDOMINAL HYSTERECTOMY    . TOTAL KNEE ARTHROPLASTY Right 12/27/2018   Procedure: RIGHT TOTAL KNEE ARTHROPLASTY;  Surgeon: Mcarthur Rossetti, MD;  [facility]: [facility];  Service: Orthopedics;  Laterality: Right;  Marland Kitchen VIDEO BRONCHOSCOPY Bilateral 01/24/2016   Procedure: VIDEO BRONCHOSCOPY WITH FLUORO;  Surgeon: Collene Gobble, MD;  Location: [facility];  Service: Cardiopulmonary;  Laterality: Bilateral;  . WISDOM TOOTH EXTRACTION      There were no vitals filed for this visit.  Subjective Assessment - 01/29/19 1132    Subjective  some itching all over with reports of small bumps on her  legs and arms, had Nurse Barbera Setters come examine her skin who thinks maybe a small systemic allergic reaction and recommneded to try benadryl and if it gets worse to contact MD    Pertinent History  COPD, Hx of DVT/PE, HTN, osteopenia    Limitations  Standing;Walking    Currently in Pain?  Yes    Pain Score  2     Pain Location  Knee    Pain Orientation  Right    Pain Descriptors / Indicators  Aching            [facility] Adult PT Treatment/Exercise - 01/29/19 0001      High Level Balance   High Level Balance Comments  march walking and retro walking in bars for dynamic balance, PRN UE support, up/down X 3 reps      Knee/Hip Exercises: Stretches    Active Hamstring Stretch  Right;2 reps;30 seconds    Active Hamstring Stretch Limitations  seated, also 3 reps 60 seconds in supine manual stretch    Knee: Self-Stretch Limitations  Rt foot on 6 inch block lunge stretch 10 sec X 10 with UE support at //bars    Gastroc Stretch Limitations  Lt 2 reps 30 seconds standing at wall      Knee/Hip Exercises: Aerobic   Recumbent Bike  5 min rocking then after 5 min able to push seat back and perform full revolutions about 5 reps      Knee/Hip Exercises: Standing   Knee Flexion  Right;Strengthening;20 reps    Knee Flexion Limitations  2 lbs      Knee/Hip Exercises: Seated   Long Arc Quad  Right;20 reps    Long Arc Quad Weight  2 lbs.      Knee/Hip Exercises: Supine   Short Arc Quad Sets  Right;20 reps    Short Arc Quad Sets Limitations  2#    Straight Leg Raises  Right;20 reps    Straight Leg Raises Limitations  2#      Manual Therapy   Passive ROM  Rt knee into extension and flexion, gentle patella mobs, STM and scar massage               PT Short Term Goals - 01/27/19 1138      PT SHORT TERM GOAL #1   Title  Pt demonstrates full independence with initial HEP.    Baseline  relays compliance and does not have questions    Time  2    Period  Weeks    Status  Achieved    Target Date  01/23/19        PT Long Term Goals - 01/27/19 1139      PT LONG TERM GOAL #1   Title  Pt is fully independent with advanced HEP.    Time  6    Period  Weeks    Status  On-going      PT LONG TERM GOAL #2   Title  Pt demonstrates R knee AROM 0-115 for functional mobility and independence.    Baseline  01/27/19: ext -10, flex 80    Time  6    Period  Weeks    Status  New      PT LONG TERM GOAL #3   Title  Pt demonstrates ability to perform steps independently for functional stair ambulation to get into home.    Time  6    Period  Weeks    Status  On-going  PT LONG TERM GOAL #4   Title  Pt demonstrates ability to safely ambulate  without and AD for functional gait and independence.    Baseline  01/09/19: enter/exit with SPC    Time  6    Period  Weeks    Status  On-going            Plan - 01/29/19 1206    Clinical Impression Statement  Some therex held due to time constraints as nurse came to examine her itching bumps that have formed (she believes this to be minor allergic reaction and recommended pt try benedryl). Session then continued to foucus on ROM and LE strength. Performed scar massage and STM due to dry skin and soft tissue restrictions. PT will continue to progress as able and she was encouraged to work more on ROM at home.    Personal Factors and Comorbidities  Age;Comorbidity 3+;Sex    Comorbidities  COPD, HTN, Osteopenia    Examination-Activity Limitations  Bathing;Bed Mobility;Locomotion Level;Stand;Stairs;Transfers;Dressing;Toileting    Examination-Participation Restrictions  Community Activity    Rehab Potential  Good    PT Frequency  3x / week    PT Duration  6 weeks    PT Treatment/Interventions  Cryotherapy;Gait training;Stair training;Neuromuscular re-education;Electrical Stimulation;Therapeutic exercise;Therapeutic activities;Balance training;Patient/family education;Manual techniques;Vasopneumatic Device;Passive range of motion    PT Next Visit Plan  aggressive ROM, gait with cane, strengthening exercises    PT Home Exercise Plan  Access Code: [email or web address]    Consulted and Agree with Plan of Care  Patient       Patient will benefit from skilled therapeutic intervention in order to improve the following deficits and impairments:  Abnormal gait, Decreased range of motion, Difficulty walking, Decreased endurance, Decreased activity tolerance, Pain, Decreased strength, Decreased balance  Visit Diagnosis: Acute pain of right knee  Muscle weakness (generalized)  Abnormality of gait  Unsteadiness on feet     Problem List Patient Active Problem List   Diagnosis Date Noted  . Status  post total right knee replacement 12/27/2018  . Unilateral primary osteoarthritis, right knee 11/27/2018  . Rib fracture 04/22/2018  . Pneumothorax on left 01/24/2016  . Abnormal CT of the chest   . Pulmonary embolism ([place])   . Interstitial lung disease ([place]) 10/08/2015  . Paroxysmal atrial fibrillation ([facility])   . Typical atrial flutter ([place])   . Chronic diastolic heart failure ([place]) 08/25/2014  . Lumbar stenosis 08/11/2014  . On amiodarone therapy 05/21/2013  . Hypercholesteremia   . Essential hypertension   . Myalgia and myositis, unspecified   . COPD GOLD 0    . Overactive bladder     Silvestre Mesi 01/29/2019, 1:03 PM  [facility] Physical Therapy [address] [place], [place], [address] Phone: [phone]   Fax:  [phone]  Name: Judy White MRN: [id number] Date of Birth: [birth year]

## 2019-01-29 NOTE — Telephone Encounter (Signed)
30 day Ortho bundle call completed.

## 2019-01-29 NOTE — Care Plan (Signed)
RNCM received call from patient on 01/28/19 and discussed that she has a small rash on her right and left leg. She verbalized that this popped up suddenly and she noticed during therapy on 01/28/19. RNCM came to therapy location today and noted several small diffuse slightly reddened bumps with some fluid. Feel this may be due to compression hose use, which she has been using since surgery, Informed that oral benadryl may help if no contraindications. Instructed patient to contact her PCP if rash worsens. 30 day survey completed.  1. Before surgery, I was provided sufficient education regarding my surgery and the bundle program. Patient answer: Strongly agree 2. I was satisfied with the care provided by the nurse at the facility where my surgery was performed. Patient answer:Strongly agree 3. Following surgery, I received sufficient postoperative care instructions.  Patient answer:Strongly agree 4. I would recommend my surgeon and this bundle program to others.  Patient answer:atrongly agree Other comments:

## 2019-01-30 ENCOUNTER — Other Ambulatory Visit: Payer: Self-pay | Admitting: Orthopaedic Surgery

## 2019-01-30 NOTE — Telephone Encounter (Signed)
Please advise 

## 2019-01-31 ENCOUNTER — Encounter: Payer: Self-pay | Admitting: Physical Therapy

## 2019-01-31 ENCOUNTER — Other Ambulatory Visit: Payer: Self-pay

## 2019-01-31 ENCOUNTER — Ambulatory Visit (INDEPENDENT_AMBULATORY_CARE_PROVIDER_SITE_OTHER): Payer: Medicare Other | Admitting: Physical Therapy

## 2019-01-31 DIAGNOSIS — R269 Unspecified abnormalities of gait and mobility: Secondary | ICD-10-CM

## 2019-01-31 DIAGNOSIS — M6281 Muscle weakness (generalized): Secondary | ICD-10-CM | POA: Diagnosis not present

## 2019-01-31 DIAGNOSIS — M25561 Pain in right knee: Secondary | ICD-10-CM | POA: Diagnosis not present

## 2019-01-31 DIAGNOSIS — R2681 Unsteadiness on feet: Secondary | ICD-10-CM

## 2019-01-31 NOTE — Therapy (Signed)
[facility] [address] [place], [place], [address] Phone: [phone]   Fax:  [phone]  Physical Therapy Treatment  Patient Details  Name: Judy White MRN: [id number] Date of Birth: 11-12-1940 Referring Provider (PT): Jean Rosenthal MD    Encounter Date: 01/31/2019  PT End of Session - 01/31/19 1150    Visit Number  9    Number of Visits  19    Date for PT Re-Evaluation  02/20/19    PT Start Time  1102    PT Stop Time  1144    PT Time Calculation (min)  42 min    Activity Tolerance  Patient tolerated treatment well    Behavior During Therapy  [facility] for tasks assessed/performed       Past Medical History:  Diagnosis Date  . Agoraphobia   . Anxiety   . Arthritis    "normal for the age; nothing gives me problems" (01/24/2016)  . COPD (chronic obstructive pulmonary disease) ([place])    "mild" (01/24/2016)  . Depressive disorder, not elsewhere classified   . DVT (deep venous thrombosis) ([place]) 10/08/2015   "they said the blood clot went from one of my legs to my lung"  . History of hiatal hernia    "small one" (01/24/2016)  . Hypercholesteremia   . Hypertension    pt denies this hx on 01/24/2016  . Myalgia and myositis, unspecified   . Osteopenia   . Overactive bladder   . Persistent atrial fibrillation ([place])    a. s/p Watchman implant 12/2014  . Presence of Watchman left atrial appendage closure device   . Pulmonary embolism ([place]) 10/08/2015  . Sleep apnea    USES CPAP     Past Surgical History:  Procedure Laterality Date  . APPENDECTOMY  1983  . ATRIAL FIBRILLATION ABLATION  06/28/2017   PER PATIENT SUCCESSFUL ABLATION , CARDIO [place] , [place]  . BACK SURGERY    . BRONCHOSCOPY  01/24/2016  . CARDIAC CATHETERIZATION     >5 yrs  . CATARACT EXTRACTION W/ INTRAOCULAR LENS  IMPLANT, BILATERAL Bilateral   . CESAREAN SECTION  1967  . LEFT ATRIAL APPENDAGE OCCLUSION N/A 12/31/2014   Procedure:  LEFT ATRIAL APPENDAGE OCCLUSION;  Surgeon: Thompson Grayer, MD;  Location: [place] [facility];  Service: Cardiovascular;  Laterality: N/A;  . POSTERIOR FUSION LUMBAR SPINE Bilateral 07/2014  . SHOULDER ARTHROSCOPY W/ ROTATOR CUFF REPAIR Left   . SHOULDER SURGERY Right    "tendon broke; couldn't be repaired"  . TEE WITHOUT CARDIOVERSION N/A 12/22/2014   Procedure: TRANSESOPHAGEAL ECHOCARDIOGRAM (TEE);  Surgeon: Pixie Casino, MD;  Location: [facility];  Service: Cardiovascular;  Laterality: N/A;  . TEE WITHOUT CARDIOVERSION N/A 02/16/2015   post Watchman TEE with good seal and no leak around device   . TONSILLECTOMY    . TOOTH EXTRACTION  05/2018  . TOTAL ABDOMINAL HYSTERECTOMY    . TOTAL KNEE ARTHROPLASTY Right 12/27/2018   Procedure: RIGHT TOTAL KNEE ARTHROPLASTY;  Surgeon: Mcarthur Rossetti, MD;  [facility];  Service: Orthopedics;  Laterality: Right;  Marland Kitchen VIDEO BRONCHOSCOPY Bilateral 01/24/2016   Procedure: VIDEO BRONCHOSCOPY WITH FLUORO;  Surgeon: Collene Gobble, MD;  Location: [place];  Service: Cardiopulmonary;  Laterality: Bilateral;  . WISDOM TOOTH EXTRACTION      There were no vitals filed for this visit.  Subjective Assessment - 01/31/19 1106    Subjective  doing well; "I didn't do the bending exercise because I was  just tired."  feels the rash is better    Pertinent History  COPD, Hx of DVT/PE, HTN, osteopenia    Limitations  Standing;Walking    Pain Score  0-No pain         [facility] PT Assessment - 01/31/19 1149      Assessment   Medical Diagnosis  R TKR    Referring Provider (PT)  Jean Rosenthal MD     Onset Date/Surgical Date  12/27/18    Next MD Visit  02/06/19      PROM   Right Knee Flexion  104                   [facility] Adult PT Treatment/Exercise - 01/31/19 1106      Knee/Hip Exercises: Aerobic   Recumbent Bike  x8 min; partial revolutions      Knee/Hip Exercises: Supine   Quad Sets  AROM;Right;20 reps    [employer]  Limitations  mod cues for technique    Short Arc [employer] reps    Short Arc [employer] Limitations  2#    Other Supine Knee/Hip Exercises  PT assisted ball roll with red physioball for flexion      Vasopneumatic   Number Minutes Vasopneumatic   --   pt declined     Manual Therapy   Passive ROM  Rt knee into extension and flexion, gentle patella mobs, STM and scar massage               PT Short Term Goals - 01/27/19 1138      PT SHORT TERM GOAL #1   Title  Pt demonstrates full independence with initial HEP.    Baseline  relays compliance and does not have questions    Time  2    Period  Weeks    Status  Achieved    Target Date  01/23/19        PT Long Term Goals - 01/27/19 1139      PT LONG TERM GOAL #1   Title  Pt is fully independent with advanced HEP.    Time  6    Period  Weeks    Status  On-going      PT LONG TERM GOAL #2   Title  Pt demonstrates R knee AROM 0-115 for functional mobility and independence.    Baseline  01/27/19: ext -10, flex 80    Time  6    Period  Weeks    Status  New      PT LONG TERM GOAL #3   Title  Pt demonstrates ability to perform steps independently for functional stair ambulation to get into home.    Time  6    Period  Weeks    Status  On-going      PT LONG TERM GOAL #4   Title  Pt demonstrates ability to safely ambulate without and AD for functional gait and independence.    Baseline  01/09/19: enter/exit with SPC    Time  6    Period  Weeks    Status  On-going            Plan - 01/31/19 1150    Clinical Impression Statement  Pt with improvement in PROM Rt knee flexion today to 104 degrees.  Overall slowly progressing well, but progress limited by minimal compliance with exercises at home.  Will continue to benefit from PT to maximize function.    Personal Factors  and Comorbidities  Age;Comorbidity 3+;Sex    Comorbidities  COPD, HTN, Osteopenia    Examination-Activity Limitations  Bathing;Bed  Mobility;Locomotion Level;Stand;Stairs;Transfers;Dressing;Toileting    Examination-Participation Restrictions  Community Activity    Rehab Potential  Good    PT Frequency  3x / week    PT Duration  6 weeks    PT Treatment/Interventions  Cryotherapy;Gait training;Stair training;Neuromuscular re-education;Electrical Stimulation;Therapeutic exercise;Therapeutic activities;Balance training;Patient/family education;Manual techniques;Vasopneumatic Device;Passive range of motion    PT Next Visit Plan  aggressive ROM, gait with cane, strengthening exercises    PT Home Exercise Plan  Access Code: [email or web address]    Consulted and Agree with Plan of Care  Patient       Patient will benefit from skilled therapeutic intervention in order to improve the following deficits and impairments:  Abnormal gait, Decreased range of motion, Difficulty walking, Decreased endurance, Decreased activity tolerance, Pain, Decreased strength, Decreased balance  Visit Diagnosis: Acute pain of right knee  Muscle weakness (generalized)  Abnormality of gait  Unsteadiness on feet     Problem List Patient Active Problem List   Diagnosis Date Noted  . Status post total right knee replacement 12/27/2018  . Unilateral primary osteoarthritis, right knee 11/27/2018  . Rib fracture 04/22/2018  . Pneumothorax on left 01/24/2016  . Abnormal CT of the chest   . Pulmonary embolism ([place])   . Interstitial lung disease ([place]) 10/08/2015  . Paroxysmal atrial fibrillation (HCC)   . Typical atrial flutter ([place])   . Chronic diastolic heart failure ([place]) 08/25/2014  . Lumbar stenosis 08/11/2014  . On amiodarone therapy 05/21/2013  . Hypercholesteremia   . Essential hypertension   . Myalgia and myositis, unspecified   . COPD GOLD 0    . Overactive bladder        Laureen Abrahams, PT, DPT 01/31/19 11:55 AM     [facility] [address] [place], [place], [address] Phone: [phone]    Fax:  [phone]  Name: Judy White MRN: [id number] Date of Birth: 10/15/1940

## 2019-02-03 ENCOUNTER — Encounter: Payer: Self-pay | Admitting: Physical Therapy

## 2019-02-03 ENCOUNTER — Other Ambulatory Visit: Payer: Self-pay

## 2019-02-03 ENCOUNTER — Ambulatory Visit (INDEPENDENT_AMBULATORY_CARE_PROVIDER_SITE_OTHER): Payer: Medicare Other | Admitting: Physical Therapy

## 2019-02-03 DIAGNOSIS — M6281 Muscle weakness (generalized): Secondary | ICD-10-CM

## 2019-02-03 DIAGNOSIS — R2681 Unsteadiness on feet: Secondary | ICD-10-CM | POA: Diagnosis not present

## 2019-02-03 DIAGNOSIS — R269 Unspecified abnormalities of gait and mobility: Secondary | ICD-10-CM | POA: Diagnosis not present

## 2019-02-03 DIAGNOSIS — M25561 Pain in right knee: Secondary | ICD-10-CM | POA: Diagnosis not present

## 2019-02-03 NOTE — Therapy (Signed)
[facility] [address] [place], [place], [address] Phone: [phone]   Fax:  [phone]  Physical Therapy Treatment/Progress Note  Patient Details  Name: Judy White MRN: [id number] Date of Birth: 13-Nov-1940 Referring Provider (PT): Jean Rosenthal MD    Encounter Date: 02/03/2019    Progress Note Reporting Period 01/09/19 to 02/03/19  See note below for Objective Data and Assessment of Progress/Goals.       PT End of Session - 02/03/19 1149    Visit Number  10    Number of Visits  19    Date for PT Re-Evaluation  02/20/19    PT Start Time  1055    PT Stop Time  1136    PT Time Calculation (min)  41 min    Activity Tolerance  Patient tolerated treatment well    Behavior During Therapy  [facility] for tasks assessed/performed       Past Medical History:  Diagnosis Date  . Agoraphobia   . Anxiety   . Arthritis    "normal for the age; nothing gives me problems" (01/24/2016)  . COPD (chronic obstructive pulmonary disease) ([place])    "mild" (01/24/2016)  . Depressive disorder, not elsewhere classified   . DVT (deep venous thrombosis) ([place]) 10/08/2015   "they said the blood clot went from one of my legs to my lung"  . History of hiatal hernia    "small one" (01/24/2016)  . Hypercholesteremia   . Hypertension    pt denies this hx on 01/24/2016  . Myalgia and myositis, unspecified   . Osteopenia   . Overactive bladder   . Persistent atrial fibrillation ([place])    a. s/p Watchman implant 12/2014  . Presence of Watchman left atrial appendage closure device   . Pulmonary embolism ([place]) 10/08/2015  . Sleep apnea    USES CPAP     Past Surgical History:  Procedure Laterality Date  . APPENDECTOMY  1983  . ATRIAL FIBRILLATION ABLATION  06/28/2017   PER PATIENT SUCCESSFUL ABLATION , CARDIO [place] , [place]  . BACK SURGERY    . BRONCHOSCOPY  01/24/2016  . CARDIAC CATHETERIZATION     >5 yrs  . CATARACT  EXTRACTION W/ INTRAOCULAR LENS  IMPLANT, BILATERAL Bilateral   . CESAREAN SECTION  1967  . LEFT ATRIAL APPENDAGE OCCLUSION N/A 12/31/2014   Procedure: LEFT ATRIAL APPENDAGE OCCLUSION;  Surgeon: Thompson Grayer, MD;  Location: [place] [facility];  Service: Cardiovascular;  Laterality: N/A;  . POSTERIOR FUSION LUMBAR SPINE Bilateral 07/2014  . SHOULDER ARTHROSCOPY W/ ROTATOR CUFF REPAIR Left   . SHOULDER SURGERY Right    "tendon broke; couldn't be repaired"  . TEE WITHOUT CARDIOVERSION N/A 12/22/2014   Procedure: TRANSESOPHAGEAL ECHOCARDIOGRAM (TEE);  Surgeon: Pixie Casino, MD;  Location: [facility] ENDOSCOPY;  Service: Cardiovascular;  Laterality: N/A;  . TEE WITHOUT CARDIOVERSION N/A 02/16/2015   post Watchman TEE with good seal and no leak around device   . TONSILLECTOMY    . TOOTH EXTRACTION  05/2018  . TOTAL ABDOMINAL HYSTERECTOMY    . TOTAL KNEE ARTHROPLASTY Right 12/27/2018   Procedure: RIGHT TOTAL KNEE ARTHROPLASTY;  Surgeon: Mcarthur Rossetti, MD;  [facility];  Service: Orthopedics;  Laterality: Right;  Marland Kitchen VIDEO BRONCHOSCOPY Bilateral 01/24/2016   Procedure: VIDEO BRONCHOSCOPY WITH FLUORO;  Surgeon: Collene Gobble, MD;  Location: [place];  Service: Cardiopulmonary;  Laterality: Bilateral;  . WISDOM TOOTH EXTRACTION      There were no  vitals filed for this visit.  Subjective Assessment - 02/03/19 1054    Subjective  doing well; did some exercises over the weekend. "just feels stiff"    Pertinent History  COPD, Hx of DVT/PE, HTN, osteopenia    Limitations  Standing;Walking    Patient Stated Goals  'get back to normal', mow grass, walking without pain, get rid of AD    Currently in Pain?  No/denies         [facility] PT Assessment - 02/03/19 1059      Assessment   Medical Diagnosis  R TKR    Referring Provider (PT)  Jean Rosenthal MD     Onset Date/Surgical Date  12/27/18    Hand Dominance  Right    Next MD Visit  02/06/19      PROM   Right Knee Extension  -5     Right Knee Flexion  105                   [facility] Adult PT Treatment/Exercise - 02/03/19 1058      Knee/Hip Exercises: Aerobic   Recumbent Bike  x8 min; partial revolutions      Knee/Hip Exercises: Supine   Quad Sets  AROM;Right;20 reps    Quad Sets Limitations  5# weight on knee for passive knee extension    Short Arc [employer]  Right;20 reps    Short Arc Quad Sets Limitations  3#    Heel Prop for Knee Extension  3 minutes;Weight    Heel Prop for Knee Extension Weight (lbs)  5    Other Supine Knee/Hip Exercises  PT assisted ball roll with red physioball for flexion      Manual Therapy   Passive ROM  Rt knee into extension and flexion, gentle patella mobs, STM and scar massage               PT Short Term Goals - 01/27/19 1138      PT SHORT TERM GOAL #1   Title  Pt demonstrates full independence with initial HEP.    Baseline  relays compliance and does not have questions    Time  2    Period  Weeks    Status  Achieved    Target Date  01/23/19        PT Long Term Goals - 02/03/19 1151      PT LONG TERM GOAL #1   Title  Pt is fully independent with advanced HEP.    Time  6    Period  Weeks    Status  On-going    Target Date  02/20/19      PT LONG TERM GOAL #2   Title  Pt demonstrates R knee AROM 0-115 for functional mobility and independence.    Baseline  12/7: PROM ext -5; flex 105    Time  6    Period  Weeks    Status  On-going      PT LONG TERM GOAL #3   Title  Pt demonstrates ability to perform steps independently for functional stair ambulation to get into home.    Time  6    Period  Weeks    Status  On-going      PT LONG TERM GOAL #4   Title  Pt demonstrates ability to safely ambulate without and AD for functional gait and independence.    Baseline  01/09/19: enter/exit with SPC    Time  6    Period  Weeks    Status  Achieved            Plan - 02/03/19 1149    Clinical Impression Statement  Pt continues to demonstrate  improvement and feel safe to amb without cane for most home/community mobility.  Recommend cane on unlevel surfaces and unfamiliar environments for now.  Overall progressing well with PT.    Personal Factors and Comorbidities  Age;Comorbidity 3+;Sex    Comorbidities  COPD, HTN, Osteopenia    Examination-Activity Limitations  Bathing;Bed Mobility;Locomotion Level;Stand;Stairs;Transfers;Dressing;Toileting    Examination-Participation Restrictions  Community Activity    Rehab Potential  Good    PT Frequency  3x / week    PT Duration  6 weeks    PT Treatment/Interventions  Cryotherapy;Gait training;Stair training;Neuromuscular re-education;Electrical Stimulation;Therapeutic exercise;Therapeutic activities;Balance training;Patient/family education;Manual techniques;Vasopneumatic Device;Passive range of motion    PT Next Visit Plan  aggressive ROM, gait with cane, strengthening exercises; needs MD note    PT Home Exercise Plan  Access Code: [email or web address]    Consulted and Agree with Plan of Care  Patient       Patient will benefit from skilled therapeutic intervention in order to improve the following deficits and impairments:  Abnormal gait, Decreased range of motion, Difficulty walking, Decreased endurance, Decreased activity tolerance, Pain, Decreased strength, Decreased balance  Visit Diagnosis: Acute pain of right knee  Muscle weakness (generalized)  Abnormality of gait  Unsteadiness on feet     Problem List Patient Active Problem List   Diagnosis Date Noted  . Status post total right knee replacement 12/27/2018  . Unilateral primary osteoarthritis, right knee 11/27/2018  . Rib fracture 04/22/2018  . Pneumothorax on left 01/24/2016  . Abnormal CT of the chest   . Pulmonary embolism ([place])   . Interstitial lung disease ([place]) 10/08/2015  . Paroxysmal atrial fibrillation (HCC)   . Typical atrial flutter ([place])   . Chronic diastolic heart failure ([place]) 08/25/2014  . Lumbar stenosis  08/11/2014  . On amiodarone therapy 05/21/2013  . Hypercholesteremia   . Essential hypertension   . Myalgia and myositis, unspecified   . COPD GOLD 0    . Overactive bladder       Laureen Abrahams, PT, DPT 02/03/19 11:53 AM    [facility] Therapy [address] [place], [place], [address] Phone: [phone]   Fax:  [phone]  Name: Judy White MRN: [id number] Date of Birth: Apr 19, 1940

## 2019-02-05 ENCOUNTER — Other Ambulatory Visit: Payer: Self-pay

## 2019-02-05 ENCOUNTER — Ambulatory Visit (INDEPENDENT_AMBULATORY_CARE_PROVIDER_SITE_OTHER): Payer: Medicare Other | Admitting: Physical Therapy

## 2019-02-05 ENCOUNTER — Encounter: Payer: Self-pay | Admitting: Physical Therapy

## 2019-02-05 DIAGNOSIS — R2681 Unsteadiness on feet: Secondary | ICD-10-CM | POA: Diagnosis not present

## 2019-02-05 DIAGNOSIS — M25561 Pain in right knee: Secondary | ICD-10-CM

## 2019-02-05 DIAGNOSIS — M6281 Muscle weakness (generalized): Secondary | ICD-10-CM

## 2019-02-05 DIAGNOSIS — R269 Unspecified abnormalities of gait and mobility: Secondary | ICD-10-CM | POA: Diagnosis not present

## 2019-02-05 NOTE — Therapy (Signed)
[facility] Physical Therapy [address] [place], [place], [address] Phone: [phone]   Fax:  [phone]  Physical Therapy Treatment  Patient Details  Name: Judy White MRN: [id number] Date of Birth: 18-Apr-1940 Referring Provider (PT): Jean Rosenthal MD    Encounter Date: 02/05/2019  PT End of Session - 02/05/19 1141    Visit Number  11    Number of Visits  19    Date for PT Re-Evaluation  02/20/19    PT Start Time  1057    PT Stop Time  1141    PT Time Calculation (min)  44 min    Activity Tolerance  Patient tolerated treatment well    Behavior During Therapy  [facility] for tasks assessed/performed       Past Medical History:  Diagnosis Date  . Agoraphobia   . Anxiety   . Arthritis    "normal for the age; nothing gives me problems" (01/24/2016)  . COPD (chronic obstructive pulmonary disease) ([place])    "mild" (01/24/2016)  . Depressive disorder, not elsewhere classified   . DVT (deep venous thrombosis) ([place]) 10/08/2015   "they said the blood clot went from one of my legs to my lung"  . History of hiatal hernia    "small one" (01/24/2016)  . Hypercholesteremia   . Hypertension    pt denies this hx on 01/24/2016  . Myalgia and myositis, unspecified   . Osteopenia   . Overactive bladder   . Persistent atrial fibrillation ([place])    a. s/p Watchman implant 12/2014  . Presence of Watchman left atrial appendage closure device   . Pulmonary embolism ([place]) 10/08/2015  . Sleep apnea    USES CPAP     Past Surgical History:  Procedure Laterality Date  . APPENDECTOMY  1983  . ATRIAL FIBRILLATION ABLATION  06/28/2017   PER PATIENT SUCCESSFUL ABLATION , CARDIO [employer] , [place]  . BACK SURGERY    . BRONCHOSCOPY  01/24/2016  . CARDIAC CATHETERIZATION     >5 yrs  . CATARACT EXTRACTION W/ INTRAOCULAR LENS  IMPLANT, BILATERAL Bilateral   . CESAREAN SECTION  1967  . LEFT ATRIAL APPENDAGE OCCLUSION N/A 12/31/2014   Procedure:  LEFT ATRIAL APPENDAGE OCCLUSION;  Surgeon: Thompson Grayer, MD;  Location: [place] [facility];  Service: Cardiovascular;  Laterality: N/A;  . POSTERIOR FUSION LUMBAR SPINE Bilateral 07/2014  . SHOULDER ARTHROSCOPY W/ ROTATOR CUFF REPAIR Left   . SHOULDER SURGERY Right    "tendon broke; couldn't be repaired"  . TEE WITHOUT CARDIOVERSION N/A 12/22/2014   Procedure: TRANSESOPHAGEAL ECHOCARDIOGRAM (TEE);  Surgeon: Pixie Casino, MD;  Location: [facility] ENDOSCOPY;  Service: Cardiovascular;  Laterality: N/A;  . TEE WITHOUT CARDIOVERSION N/A 02/16/2015   post Watchman TEE with good seal and no leak around device   . TONSILLECTOMY    . TOOTH EXTRACTION  05/2018  . TOTAL ABDOMINAL HYSTERECTOMY    . TOTAL KNEE ARTHROPLASTY Right 12/27/2018   Procedure: RIGHT TOTAL KNEE ARTHROPLASTY;  Surgeon: Mcarthur Rossetti, MD;  [facility];  Service: Orthopedics;  Laterality: Right;  Marland Kitchen VIDEO BRONCHOSCOPY Bilateral 01/24/2016   Procedure: VIDEO BRONCHOSCOPY WITH FLUORO;  Surgeon: Collene Gobble, MD;  Location: [place];  Service: Cardiopulmonary;  Laterality: Bilateral;  . WISDOM TOOTH EXTRACTION      There were no vitals filed for this visit.  Subjective Assessment - 02/05/19 1105    Subjective  wants to decr to 2x/wk, "My husband just doesn't know what  to do with Christmas."    Pertinent History  COPD, Hx of DVT/PE, HTN, osteopenia    Limitations  Standing;Walking    Patient Stated Goals  'get back to normal', mow grass, walking without pain, get rid of AD    Currently in Pain?  No/denies         [facility] PT Assessment - 02/05/19 1121      Assessment   Medical Diagnosis  R TKR    Referring Provider (PT)  Jean Rosenthal MD     Onset Date/Surgical Date  12/27/18    Hand Dominance  Right    Next MD Visit  02/06/19      AROM   Right Knee Extension  -3    Right Knee Flexion  96      PROM   Right Knee Extension  0    Right Knee Flexion  107                   [facility] Adult  PT Treatment/Exercise - 02/05/19 1110      Knee/Hip Exercises: Stretches   Quad Stretch  Right;2 reps;30 seconds    Quad Stretch Limitations  prone with strap      Knee/Hip Exercises: Aerobic   Nustep  L6 x 9 min      Knee/Hip Exercises: Standing   Lateral Step Up  Right;10 reps;Hand Hold: 0;Step Height: 6"    Forward Step Up  Right;10 reps;Hand Hold: 1;Step Height: 6"      Knee/Hip Exercises: Supine   Quad Sets  AROM;Right;20 reps    Quad Sets Limitations  heel prop with 5# on knee    Short Arc [employer]  Right;20 reps    Short Arc Quad Sets Limitations  3#    Heel Slides  AAROM;Right;15 reps               PT Short Term Goals - 01/27/19 1138      PT SHORT TERM GOAL #1   Title  Pt demonstrates full independence with initial HEP.    Baseline  relays compliance and does not have questions    Time  2    Period  Weeks    Status  Achieved    Target Date  01/23/19        PT Long Term Goals - 02/03/19 1151      PT LONG TERM GOAL #1   Title  Pt is fully independent with advanced HEP.    Time  6    Period  Weeks    Status  On-going    Target Date  02/20/19      PT LONG TERM GOAL #2   Title  Pt demonstrates R knee AROM 0-115 for functional mobility and independence.    Baseline  12/7: PROM ext -5; flex 105    Time  6    Period  Weeks    Status  On-going      PT LONG TERM GOAL #3   Title  Pt demonstrates ability to perform steps independently for functional stair ambulation to get into home.    Time  6    Period  Weeks    Status  On-going      PT LONG TERM GOAL #4   Title  Pt demonstrates ability to safely ambulate without and AD for functional gait and independence.    Baseline  01/09/19: enter/exit with SPC    Time  6    Period  Weeks  Status  Achieved            Plan - 02/05/19 1141    Clinical Impression Statement  Pt with great functional improvement, and able to amb without device safely at this time.  Still limited with AROM and continue  to work aggressively within pt's pain tolerance.  Progressing well with PT.    Personal Factors and Comorbidities  Age;Comorbidity 3+;Sex    Comorbidities  COPD, HTN, Osteopenia    Examination-Activity Limitations  Bathing;Bed Mobility;Locomotion Level;Stand;Stairs;Transfers;Dressing;Toileting    Examination-Participation Restrictions  Community Activity    Rehab Potential  Good    PT Frequency  3x / week    PT Duration  6 weeks    PT Treatment/Interventions  Cryotherapy;Gait training;Stair training;Neuromuscular re-education;Electrical Stimulation;Therapeutic exercise;Therapeutic activities;Balance training;Patient/family education;Manual techniques;Vasopneumatic Device;Passive range of motion    PT Next Visit Plan  aggressive ROM, gait with cane, strengthening exercises    PT Home Exercise Plan  Access Code: [email or web address]    Consulted and Agree with Plan of Care  Patient       Patient will benefit from skilled therapeutic intervention in order to improve the following deficits and impairments:  Abnormal gait, Decreased range of motion, Difficulty walking, Decreased endurance, Decreased activity tolerance, Pain, Decreased strength, Decreased balance  Visit Diagnosis: Acute pain of right knee  Muscle weakness (generalized)  Abnormality of gait  Unsteadiness on feet     Problem List Patient Active Problem List   Diagnosis Date Noted  . Status post total right knee replacement 12/27/2018  . Unilateral primary osteoarthritis, right knee 11/27/2018  . Rib fracture 04/22/2018  . Pneumothorax on left 01/24/2016  . Abnormal CT of the chest   . Pulmonary embolism ([place])   . Interstitial lung disease ([place]) 10/08/2015  . Paroxysmal atrial fibrillation (HCC)   . Typical atrial flutter ([place])   . Chronic diastolic heart failure ([place]) 08/25/2014  . Lumbar stenosis 08/11/2014  . On amiodarone therapy 05/21/2013  . Hypercholesteremia   . Essential hypertension   . Myalgia and myositis,  unspecified   . COPD GOLD 0    . Overactive bladder       Laureen Abrahams, PT, DPT 02/05/19 11:44 AM     [facility] Physical Therapy [address] [place], [place], [address] Phone: [phone]   Fax:  [phone]  Name: Judy White MRN: [id number] Date of Birth: 03-15-1940

## 2019-02-06 ENCOUNTER — Encounter: Payer: Self-pay | Admitting: Orthopaedic Surgery

## 2019-02-06 ENCOUNTER — Ambulatory Visit (INDEPENDENT_AMBULATORY_CARE_PROVIDER_SITE_OTHER): Payer: Medicare Other | Admitting: Orthopaedic Surgery

## 2019-02-06 DIAGNOSIS — Z96651 Presence of right artificial knee joint: Secondary | ICD-10-CM

## 2019-02-06 NOTE — Progress Notes (Signed)
The patient is now 5 weeks and 6 days status post a right total knee arthroplasty.  She has been working with physical therapy here at [facility].  On exam, her calf is soft.  Her right knee is moderately swollen still.  Her incisions well-healed.  Her extension is almost full and her flexion the office I can get to about just barely beyond 90 degrees.  I stressed the importance of getting her knee bending is much as possible.  She feels that physical therapy here is doing a good job for her and she is impressed with [employer].  I would like to see her flexion more and avoid a manipulation under anesthesia.  The knee does feel ligamentously stable.  She is still taking oxycodone but has been weaning herself somewhat from this.  I do feel that she will likely need more and she understands that she can call us for this.  I would like to see her back in 4 weeks to see how she is doing clinically.  All question concerns were answered and addressed.

## 2019-02-07 ENCOUNTER — Encounter: Payer: Medicare Other | Admitting: Physical Therapy

## 2019-02-10 ENCOUNTER — Other Ambulatory Visit: Payer: Self-pay

## 2019-02-10 ENCOUNTER — Encounter: Payer: Self-pay | Admitting: Physical Therapy

## 2019-02-10 ENCOUNTER — Ambulatory Visit (INDEPENDENT_AMBULATORY_CARE_PROVIDER_SITE_OTHER): Payer: Medicare Other | Admitting: Physical Therapy

## 2019-02-10 DIAGNOSIS — M6281 Muscle weakness (generalized): Secondary | ICD-10-CM

## 2019-02-10 DIAGNOSIS — R2681 Unsteadiness on feet: Secondary | ICD-10-CM

## 2019-02-10 DIAGNOSIS — R269 Unspecified abnormalities of gait and mobility: Secondary | ICD-10-CM | POA: Diagnosis not present

## 2019-02-10 DIAGNOSIS — M25561 Pain in right knee: Secondary | ICD-10-CM

## 2019-02-10 NOTE — Therapy (Signed)
[facility] [address] [place], [place], [address] Phone: [phone]   Fax:  [phone]  Physical Therapy Treatment  Patient Details  Name: Judy White MRN: [id number] Date of Birth: January 25, 1941 Referring Provider (PT): Jean Rosenthal MD    Encounter Date: 02/10/2019  PT End of Session - 02/10/19 1048    Visit Number  12    Number of Visits  19    Date for PT Re-Evaluation  02/20/19    PT Start Time  1005    PT Stop Time  1045    PT Time Calculation (min)  40 min    Activity Tolerance  Patient tolerated treatment well    Behavior During Therapy  [facility] for tasks assessed/performed       Past Medical History:  Diagnosis Date  . Agoraphobia   . Anxiety   . Arthritis    "normal for the age; nothing gives me problems" (01/24/2016)  . COPD (chronic obstructive pulmonary disease) ([place])    "mild" (01/24/2016)  . Depressive disorder, not elsewhere classified   . DVT (deep venous thrombosis) ([place]) 10/08/2015   "they said the blood clot went from one of my legs to my lung"  . History of hiatal hernia    "small one" (01/24/2016)  . Hypercholesteremia   . Hypertension    pt denies this hx on 01/24/2016  . Myalgia and myositis, unspecified   . Osteopenia   . Overactive bladder   . Persistent atrial fibrillation ([place])    a. s/p Watchman implant 12/2014  . Presence of Watchman left atrial appendage closure device   . Pulmonary embolism ([place]) 10/08/2015  . Sleep apnea    USES CPAP     Past Surgical History:  Procedure Laterality Date  . APPENDECTOMY  1983  . ATRIAL FIBRILLATION ABLATION  06/28/2017   PER PATIENT SUCCESSFUL ABLATION , CARDIO [place] , [place]  . BACK SURGERY    . BRONCHOSCOPY  01/24/2016  . CARDIAC CATHETERIZATION     >5 yrs  . CATARACT EXTRACTION W/ INTRAOCULAR LENS  IMPLANT, BILATERAL Bilateral   . CESAREAN SECTION  1967  . LEFT ATRIAL APPENDAGE OCCLUSION N/A 12/31/2014   Procedure:  LEFT ATRIAL APPENDAGE OCCLUSION;  Surgeon: Thompson Grayer, MD;  Location: [place] CV [facility];  Service: Cardiovascular;  Laterality: N/A;  . POSTERIOR FUSION LUMBAR SPINE Bilateral 07/2014  . SHOULDER ARTHROSCOPY W/ ROTATOR CUFF REPAIR Left   . SHOULDER SURGERY Right    "tendon broke; couldn't be repaired"  . TEE WITHOUT CARDIOVERSION N/A 12/22/2014   Procedure: TRANSESOPHAGEAL ECHOCARDIOGRAM (TEE);  Surgeon: Pixie Casino, MD;  Location: [facility] ENDOSCOPY;  Service: Cardiovascular;  Laterality: N/A;  . TEE WITHOUT CARDIOVERSION N/A 02/16/2015   post Watchman TEE with good seal and no leak around device   . TONSILLECTOMY    . TOOTH EXTRACTION  05/2018  . TOTAL ABDOMINAL HYSTERECTOMY    . TOTAL KNEE ARTHROPLASTY Right 12/27/2018   Procedure: RIGHT TOTAL KNEE ARTHROPLASTY;  Surgeon: Mcarthur Rossetti, MD;  Location: [facility];  Service: Orthopedics;  Laterality: Right;  Marland Kitchen VIDEO BRONCHOSCOPY Bilateral 01/24/2016   Procedure: VIDEO BRONCHOSCOPY WITH FLUORO;  Surgeon: Collene Gobble, MD;  Location: [place];  Service: Cardiopulmonary;  Laterality: Bilateral;  . WISDOM TOOTH EXTRACTION      There were no vitals filed for this visit.  Subjective Assessment - 02/10/19 1011    Subjective  doing well; saw MD last week.  Needs to increase flexion  or possible manipulation. rode her bike over the weekend - "it went around 3 times"    Pertinent History  COPD, Hx of DVT/PE, HTN, osteopenia    Limitations  Standing;Walking    Patient Stated Goals  'get back to normal', mow grass, walking without pain, get rid of AD    Currently in Pain?  No/denies                       [facility] Adult PT Treatment/Exercise - 02/10/19 1014      Knee/Hip Exercises: Aerobic   Recumbent Bike  x8 min; partial revolutions      Knee/Hip Exercises: Seated   Heel Slides  Right;10 reps   with scoot forward for increased ROM     Knee/Hip Exercises: Supine   Heel Slides  AAROM;Right;15 reps      Manual  Therapy   Manual Therapy  Passive ROM    Passive ROM  Rt knee into flexion supine and seated             PT Education - 02/10/19 1048    Education Details  HEP; need for aggressive ROM at home to avoid manipulation    Person(s) Educated  Patient    Methods  Explanation;Demonstration;Handout    Comprehension  Verbalized understanding;Returned demonstration;Need further instruction       PT Short Term Goals - 01/27/19 1138      PT SHORT TERM GOAL #1   Title  Pt demonstrates full independence with initial HEP.    Baseline  relays compliance and does not have questions    Time  2    Period  Weeks    Status  Achieved    Target Date  01/23/19        PT Long Term Goals - 02/03/19 1151      PT LONG TERM GOAL #1   Title  Pt is fully independent with advanced HEP.    Time  6    Period  Weeks    Status  On-going    Target Date  02/20/19      PT LONG TERM GOAL #2   Title  Pt demonstrates R knee AROM 0-115 for functional mobility and independence.    Baseline  12/7: PROM ext -5; flex 105    Time  6    Period  Weeks    Status  On-going      PT LONG TERM GOAL #3   Title  Pt demonstrates ability to perform steps independently for functional stair ambulation to get into home.    Time  6    Period  Weeks    Status  On-going      PT LONG TERM GOAL #4   Title  Pt demonstrates ability to safely ambulate without and AD for functional gait and independence.    Baseline  01/09/19: enter/exit with SPC    Time  6    Period  Weeks    Status  Achieved            Plan - 02/10/19 1048    Clinical Impression Statement  Skilled session today focused on aggressive flexion ROM, and reinforcing compliance with HEP at home, as pt still inconsistent with working ROM at home.  Slowly progressing well with PT at this time.    Personal Factors and Comorbidities  Age;Comorbidity 3+;Sex    Comorbidities  COPD, HTN, Osteopenia    Examination-Activity Limitations  Bathing;Bed  Mobility;Locomotion Level;Stand;Stairs;Transfers;Dressing;Toileting  Examination-Participation Restrictions  Community Activity    Rehab Potential  Good    PT Frequency  3x / week    PT Duration  6 weeks    PT Treatment/Interventions  Cryotherapy;Gait training;Stair training;Neuromuscular re-education;Electrical Stimulation;Therapeutic exercise;Therapeutic activities;Balance training;Patient/family education;Manual techniques;Vasopneumatic Device;Passive range of motion    PT Next Visit Plan  aggressive ROM, strengthening exercises    PT Home Exercise Plan  Access Code: [email or web address]    Consulted and Agree with Plan of Care  Patient       Patient will benefit from skilled therapeutic intervention in order to improve the following deficits and impairments:  Abnormal gait, Decreased range of motion, Difficulty walking, Decreased endurance, Decreased activity tolerance, Pain, Decreased strength, Decreased balance  Visit Diagnosis: Acute pain of right knee  Muscle weakness (generalized)  Abnormality of gait  Unsteadiness on feet     Problem List Patient Active Problem List   Diagnosis Date Noted  . Status post total right knee replacement 12/27/2018  . Unilateral primary osteoarthritis, right knee 11/27/2018  . Rib fracture 04/22/2018  . Pneumothorax on left 01/24/2016  . Abnormal CT of the chest   . Pulmonary embolism ([place])   . Interstitial lung disease ([place]) 10/08/2015  . Paroxysmal atrial fibrillation ([facility])   . Typical atrial flutter ([place])   . Chronic diastolic heart failure ([place]) 08/25/2014  . Lumbar stenosis 08/11/2014  . On amiodarone therapy 05/21/2013  . Hypercholesteremia   . Essential hypertension   . Myalgia and myositis, unspecified   . COPD GOLD 0    . Overactive bladder       Laureen Abrahams, PT, DPT 02/10/19 10:50 AM     [facility] [address] [place], [place], [address] Phone: [phone]   Fax:   [phone]  Name: KAELYNNE VOID MRN: [id number] Date of Birth: 06-07-1940

## 2019-02-10 NOTE — Patient Instructions (Signed)
Access Code: [email or web address]  URL: [email or web address]  Date: 02/10/2019  Prepared by: Faustino Congress   Exercises Supine Ankle Pumps - 10 reps - 1 sets - 3x daily - 7x weekly Supine Quad Set - 10 reps - 1 sets - 3x daily - 7x weekly Supine Heel Slide with Strap - 10 reps - 1 sets - 3x daily - 7x weekly Supine Knee Extension Mobilization with Weight - 1 reps - 1 sets - 3-5 min hold - 3x daily - 7x weekly Supine Active Straight Leg Raise - 10 reps - 1 sets - 3x daily - 7x weekly Seated Knee Flexion Stretch - 10 reps - 1 sets - 10 sec hold - 5x daily - 7x weekly

## 2019-02-12 ENCOUNTER — Encounter: Payer: Medicare Other | Admitting: Physical Therapy

## 2019-02-12 ENCOUNTER — Other Ambulatory Visit: Payer: Self-pay | Admitting: Orthopaedic Surgery

## 2019-02-12 NOTE — Telephone Encounter (Signed)
Please advise 

## 2019-02-14 ENCOUNTER — Encounter: Payer: Medicare Other | Admitting: Physical Therapy

## 2019-02-17 ENCOUNTER — Ambulatory Visit (INDEPENDENT_AMBULATORY_CARE_PROVIDER_SITE_OTHER): Payer: Medicare Other | Admitting: Physical Therapy

## 2019-02-17 ENCOUNTER — Other Ambulatory Visit: Payer: Self-pay

## 2019-02-17 ENCOUNTER — Encounter: Payer: Self-pay | Admitting: Physical Therapy

## 2019-02-17 DIAGNOSIS — M25561 Pain in right knee: Secondary | ICD-10-CM

## 2019-02-17 DIAGNOSIS — R269 Unspecified abnormalities of gait and mobility: Secondary | ICD-10-CM | POA: Diagnosis not present

## 2019-02-17 DIAGNOSIS — R2681 Unsteadiness on feet: Secondary | ICD-10-CM | POA: Diagnosis not present

## 2019-02-17 DIAGNOSIS — M6281 Muscle weakness (generalized): Secondary | ICD-10-CM

## 2019-02-17 NOTE — Therapy (Signed)
[facility] [address] [place], [place], [address] Phone: [phone]   Fax:  [phone]  Physical Therapy Treatment  Patient Details  Name: Judy White MRN: [id number] Date of Birth: March 23, 1940 Referring Provider (PT): Jean Rosenthal MD    Encounter Date: 02/17/2019  PT End of Session - 02/17/19 1223    Visit Number  13    Number of Visits  19    Date for PT Re-Evaluation  02/20/19    PT Start Time  1008    PT Stop Time  1051    PT Time Calculation (min)  43 min    Activity Tolerance  Patient tolerated treatment well    Behavior During Therapy  [facility] for tasks assessed/performed       Past Medical History:  Diagnosis Date  . Agoraphobia   . Anxiety   . Arthritis    "normal for the age; nothing gives me problems" (01/24/2016)  . COPD (chronic obstructive pulmonary disease) ([place])    "mild" (01/24/2016)  . Depressive disorder, not elsewhere classified   . DVT (deep venous thrombosis) ([place]) 10/08/2015   "they said the blood clot went from one of my legs to my lung"  . History of hiatal hernia    "small one" (01/24/2016)  . Hypercholesteremia   . Hypertension    pt denies this hx on 01/24/2016  . Myalgia and myositis, unspecified   . Osteopenia   . Overactive bladder   . Persistent atrial fibrillation ([place])    a. s/p Watchman implant 12/2014  . Presence of Watchman left atrial appendage closure device   . Pulmonary embolism ([place]) 10/08/2015  . Sleep apnea    USES CPAP     Past Surgical History:  Procedure Laterality Date  . APPENDECTOMY  1983  . ATRIAL FIBRILLATION ABLATION  06/28/2017   PER PATIENT SUCCESSFUL ABLATION , CARDIO [place] , [place]  . BACK SURGERY    . BRONCHOSCOPY  01/24/2016  . CARDIAC CATHETERIZATION     >5 yrs  . CATARACT EXTRACTION W/ INTRAOCULAR LENS  IMPLANT, BILATERAL Bilateral   . CESAREAN SECTION  1967  . LEFT ATRIAL APPENDAGE OCCLUSION N/A 12/31/2014   Procedure:  LEFT ATRIAL APPENDAGE OCCLUSION;  Surgeon: Thompson Grayer, MD;  Location: [place] CV LAB;  Service: Cardiovascular;  Laterality: N/A;  . POSTERIOR FUSION LUMBAR SPINE Bilateral 07/2014  . SHOULDER ARTHROSCOPY W/ ROTATOR CUFF REPAIR Left   . SHOULDER SURGERY Right    "tendon broke; couldn't be repaired"  . TEE WITHOUT CARDIOVERSION N/A 12/22/2014   Procedure: TRANSESOPHAGEAL ECHOCARDIOGRAM (TEE);  Surgeon: Pixie Casino, MD;  Location: [facility] ENDOSCOPY;  Service: Cardiovascular;  Laterality: N/A;  . TEE WITHOUT CARDIOVERSION N/A 02/16/2015   post Watchman TEE with good seal and no leak around device   . TONSILLECTOMY    . TOOTH EXTRACTION  05/2018  . TOTAL ABDOMINAL HYSTERECTOMY    . TOTAL KNEE ARTHROPLASTY Right 12/27/2018   Procedure: RIGHT TOTAL KNEE ARTHROPLASTY;  Surgeon: Mcarthur Rossetti, MD;  [facility];  Service: Orthopedics;  Laterality: Right;  Marland Kitchen VIDEO BRONCHOSCOPY Bilateral 01/24/2016   Procedure: VIDEO BRONCHOSCOPY WITH FLUORO;  Surgeon: Collene Gobble, MD;  Location: [place];  Service: Cardiopulmonary;  Laterality: Bilateral;  . WISDOM TOOTH EXTRACTION      There were no vitals filed for this visit.  Subjective Assessment - 02/17/19 1013    Subjective  doing well.  knee is feeling better    Pertinent  History  COPD, Hx of DVT/PE, HTN, osteopenia    Limitations  Standing;Walking    Patient Stated Goals  'get back to normal', mow grass, walking without pain, get rid of AD    Currently in Pain?  No/denies         [facility] PT Assessment - 02/17/19 1044      Assessment   Medical Diagnosis  R TKR    Referring Provider (PT)  Jean Rosenthal MD     Onset Date/Surgical Date  12/27/18    Next MD Visit  03/05/2019      AROM   Right Knee Extension  0    Right Knee Flexion  110      PROM   Right Knee Extension  1    Right Knee Flexion  113                   [facility] Adult PT Treatment/Exercise - 02/17/19 1014      Knee/Hip Exercises:  Stretches   Passive Hamstring Stretch  Right;3 reps;30 seconds    Passive Hamstring Stretch Limitations  supine with strap    Gastroc Stretch  Right;2 reps;30 seconds      Knee/Hip Exercises: Aerobic   Recumbent Bike  x8 min; full revolutions - seat 8,7      Knee/Hip Exercises: Supine   Quad Sets  AROM;Right;20 reps    Quad Sets Limitations  5 sec hold; heel prop with 5# weight    Heel Slides  AAROM;Right;15 reps    Heel Prop for Knee Extension  3 minutes;Weight    Heel Prop for Knee Extension Weight (lbs)  5      Manual Therapy   Passive ROM  Rt knee flexion/extension               PT Short Term Goals - 01/27/19 1138      PT SHORT TERM GOAL #1   Title  Pt demonstrates full independence with initial HEP.    Baseline  relays compliance and does not have questions    Time  2    Period  Weeks    Status  Achieved    Target Date  01/23/19        PT Long Term Goals - 02/03/19 1151      PT LONG TERM GOAL #1   Title  Pt is fully independent with advanced HEP.    Time  6    Period  Weeks    Status  On-going    Target Date  02/20/19      PT LONG TERM GOAL #2   Title  Pt demonstrates R knee AROM 0-115 for functional mobility and independence.    Baseline  12/7: PROM ext -5; flex 105    Time  6    Period  Weeks    Status  On-going      PT LONG TERM GOAL #3   Title  Pt demonstrates ability to perform steps independently for functional stair ambulation to get into home.    Time  6    Period  Weeks    Status  On-going      PT LONG TERM GOAL #4   Title  Pt demonstrates ability to safely ambulate without and AD for functional gait and independence.    Baseline  01/09/19: enter/exit with SPC    Time  6    Period  Weeks    Status  Achieved  Plan - 02/17/19 1223    Clinical Impression Statement  Pt returns today to PT after missing ~ 1 week of therapy, and demonstrating improved ROM today.  Pt has been more compliant at home with exercises, and  motion is improving.  Progressing well with PT.    Personal Factors and Comorbidities  Age;Comorbidity 3+;Sex    Comorbidities  COPD, HTN, Osteopenia    Examination-Activity Limitations  Bathing;Bed Mobility;Locomotion Level;Stand;Stairs;Transfers;Dressing;Toileting    Examination-Participation Restrictions  Community Activity    Rehab Potential  Good    PT Frequency  3x / week    PT Duration  6 weeks    PT Treatment/Interventions  Cryotherapy;Gait training;Stair training;Neuromuscular re-education;Electrical Stimulation;Therapeutic exercise;Therapeutic activities;Balance training;Patient/family education;Manual techniques;Vasopneumatic Device;Passive range of motion    PT Next Visit Plan  aggressive ROM, strengthening exercises    PT Home Exercise Plan  Access Code: [email or web address]    Consulted and Agree with Plan of Care  Patient       Patient will benefit from skilled therapeutic intervention in order to improve the following deficits and impairments:  Abnormal gait, Decreased range of motion, Difficulty walking, Decreased endurance, Decreased activity tolerance, Pain, Decreased strength, Decreased balance  Visit Diagnosis: Acute pain of right knee  Muscle weakness (generalized)  Abnormality of gait  Unsteadiness on feet     Problem List Patient Active Problem List   Diagnosis Date Noted  . Status post total right knee replacement 12/27/2018  . Unilateral primary osteoarthritis, right knee 11/27/2018  . Rib fracture 04/22/2018  . Pneumothorax on left 01/24/2016  . Abnormal CT of the chest   . Pulmonary embolism ([place])   . Interstitial lung disease ([place]) 10/08/2015  . Paroxysmal atrial fibrillation ([facility])   . Typical atrial flutter ([place])   . Chronic diastolic heart failure ([place]) 08/25/2014  . Lumbar stenosis 08/11/2014  . On amiodarone therapy 05/21/2013  . Hypercholesteremia   . Essential hypertension   . Myalgia and myositis, unspecified   . COPD GOLD 0    . Overactive  bladder       Laureen Abrahams, PT, DPT 02/17/19 12:26 PM     [facility] Physical Therapy [address] [place], [place], [address] Phone: [phone]   Fax:  [phone]  Name: Judy White MRN: [id number] Date of Birth: [birth year]

## 2019-02-18 ENCOUNTER — Encounter: Payer: Medicare Other | Admitting: Physical Therapy

## 2019-02-19 ENCOUNTER — Encounter: Payer: Medicare Other | Admitting: Physical Therapy

## 2019-02-24 ENCOUNTER — Other Ambulatory Visit: Payer: Self-pay

## 2019-02-24 ENCOUNTER — Ambulatory Visit (INDEPENDENT_AMBULATORY_CARE_PROVIDER_SITE_OTHER): Payer: Medicare Other | Admitting: Physical Therapy

## 2019-02-24 ENCOUNTER — Encounter: Payer: Self-pay | Admitting: Physical Therapy

## 2019-02-24 DIAGNOSIS — R269 Unspecified abnormalities of gait and mobility: Secondary | ICD-10-CM | POA: Diagnosis not present

## 2019-02-24 DIAGNOSIS — M25561 Pain in right knee: Secondary | ICD-10-CM | POA: Diagnosis not present

## 2019-02-24 DIAGNOSIS — M6281 Muscle weakness (generalized): Secondary | ICD-10-CM | POA: Diagnosis not present

## 2019-02-24 DIAGNOSIS — R2681 Unsteadiness on feet: Secondary | ICD-10-CM

## 2019-02-24 NOTE — Therapy (Signed)
[facility] Physical Therapy [address] [place], [place], [address] Phone: [phone]   Fax:  [phone]  Physical Therapy Treatment/Recertification  Patient Details  Name: Judy White MRN: [id number] Date of Birth: 08-Aug-1940 Referring Provider (PT): Jean Rosenthal MD    Encounter Date: 02/24/2019  PT End of Session - 02/24/19 1049    Visit Number  14    Number of Visits  19    Date for PT Re-Evaluation  03/24/19    PT Start Time  1013    PT Stop Time  1051    PT Time Calculation (min)  38 min    Activity Tolerance  Patient tolerated treatment well    Behavior During Therapy  [facility] for tasks assessed/performed       Past Medical History:  Diagnosis Date  . Agoraphobia   . Anxiety   . Arthritis    "normal for the age; nothing gives me problems" (01/24/2016)  . COPD (chronic obstructive pulmonary disease) ([place])    "mild" (01/24/2016)  . Depressive disorder, not elsewhere classified   . DVT (deep venous thrombosis) ([place]) 10/08/2015   "they said the blood clot went from one of my legs to my lung"  . History of hiatal hernia    "small one" (01/24/2016)  . Hypercholesteremia   . Hypertension    pt denies this hx on 01/24/2016  . Myalgia and myositis, unspecified   . Osteopenia   . Overactive bladder   . Persistent atrial fibrillation ([place])    a. s/p Watchman implant 12/2014  . Presence of Watchman left atrial appendage closure device   . Pulmonary embolism ([place]) 10/08/2015  . Sleep apnea    USES CPAP     Past Surgical History:  Procedure Laterality Date  . APPENDECTOMY  1983  . ATRIAL FIBRILLATION ABLATION  06/28/2017   PER PATIENT SUCCESSFUL ABLATION , CARDIO [place] , [place]  . BACK SURGERY    . BRONCHOSCOPY  01/24/2016  . CARDIAC CATHETERIZATION     >5 yrs  . CATARACT EXTRACTION W/ INTRAOCULAR LENS  IMPLANT, BILATERAL Bilateral   . CESAREAN SECTION  1967  . LEFT ATRIAL APPENDAGE OCCLUSION N/A 12/31/2014    Procedure: LEFT ATRIAL APPENDAGE OCCLUSION;  Surgeon: Thompson Grayer, MD;  Location: [place] CV [facility];  Service: Cardiovascular;  Laterality: N/A;  . POSTERIOR FUSION LUMBAR SPINE Bilateral 07/2014  . SHOULDER ARTHROSCOPY W/ ROTATOR CUFF REPAIR Left   . SHOULDER SURGERY Right    "tendon broke; couldn't be repaired"  . TEE WITHOUT CARDIOVERSION N/A 12/22/2014   Procedure: TRANSESOPHAGEAL ECHOCARDIOGRAM (TEE);  Surgeon: Pixie Casino, MD;  Location: [facility] ENDOSCOPY;  Service: Cardiovascular;  Laterality: N/A;  . TEE WITHOUT CARDIOVERSION N/A 02/16/2015   post Watchman TEE with good seal and no leak around device   . TONSILLECTOMY    . TOOTH EXTRACTION  05/2018  . TOTAL ABDOMINAL HYSTERECTOMY    . TOTAL KNEE ARTHROPLASTY Right 12/27/2018   Procedure: RIGHT TOTAL KNEE ARTHROPLASTY;  Surgeon: Mcarthur Rossetti, MD;  Location: [facility];  Service: Orthopedics;  Laterality: Right;  Marland Kitchen VIDEO BRONCHOSCOPY Bilateral 01/24/2016   Procedure: VIDEO BRONCHOSCOPY WITH FLUORO;  Surgeon: Collene Gobble, MD;  Location: [place];  Service: Cardiopulmonary;  Laterality: Bilateral;  . WISDOM TOOTH EXTRACTION      There were no vitals filed for this visit.  Subjective Assessment - 02/24/19 1012    Subjective  overall her knee is doing well; missed Christmas due to family  testing positive for COVID (hasn't seen them since Thanksgiving)    Pertinent History  COPD, Hx of DVT/PE, HTN, osteopenia    Limitations  Standing;Walking    Patient Stated Goals  'get back to normal', mow grass, walking without pain, get rid of AD    Currently in Pain?  No/denies         [facility] PT Assessment - 02/24/19 1041      Assessment   Medical Diagnosis  R TKR    Referring Provider (PT)  Jean Rosenthal MD     Onset Date/Surgical Date  12/27/18    Next MD Visit  03/05/2019      AROM   Right Knee Extension  0    Right Knee Flexion  117      PROM   Right Knee Flexion  118                   [facility]  Adult PT Treatment/Exercise - 02/24/19 1016      Knee/Hip Exercises: Stretches   Passive Hamstring Stretch  Right;3 reps;30 seconds    Passive Hamstring Stretch Limitations  supine with strap; overpressure at distal thigh      Knee/Hip Exercises: Aerobic   Tread Mill  2.0 mph x 5 min    Recumbent Bike  x8 min; full revolutions - seat 6      Knee/Hip Exercises: Machines for Strengthening   Total Gym Leg Press  75# ; 50# RLE only      Knee/Hip Exercises: Supine   Heel Slides  AAROM;Right;5 reps      Manual Therapy   Passive ROM  Rt knee flexion/extension               PT Short Term Goals - 01/27/19 1138      PT SHORT TERM GOAL #1   Title  Pt demonstrates full independence with initial HEP.    Baseline  relays compliance and does not have questions    Time  2    Period  Weeks    Status  Achieved    Target Date  01/23/19        PT Long Term Goals - 02/24/19 1050      PT LONG TERM GOAL #1   Title  Pt is fully independent with advanced HEP.    Time  6    Period  Weeks    Status  On-going    Target Date  03/24/19      PT LONG TERM GOAL #2   Title  Pt demonstrates R knee AROM 0-115 for functional mobility and independence.    Baseline  12/28:0-117    Time  6    Period  Weeks    Status  Achieved      PT LONG TERM GOAL #3   Title  Pt demonstrates ability to perform steps independently for functional stair ambulation to get into home.    Time  6    Period  Weeks    Status  On-going    Target Date  03/24/19      PT LONG TERM GOAL #4   Title  Pt demonstrates ability to safely ambulate without and AD for functional gait and independence.    Baseline  01/09/19: enter/exit with SPC    Time  6    Period  Weeks    Status  Achieved            Plan - 02/24/19 1051    Clinical  Impression Statement  Pt has met 2/4 LTGs and overall is doing well with PT.  Anticipate pt will be ready for d/c next 1-2 visits.  Plan to progress HEP to add strengthening exercises.     Personal Factors and Comorbidities  Age;Comorbidity 3+;Sex    Comorbidities  COPD, HTN, Osteopenia    Examination-Activity Limitations  Bathing;Bed Mobility;Locomotion Level;Stand;Stairs;Transfers;Dressing;Toileting    Examination-Participation Restrictions  Community Activity    Rehab Potential  Good    PT Frequency  3x / week    PT Duration  6 weeks    PT Treatment/Interventions  Cryotherapy;Gait training;Stair training;Neuromuscular re-education;Electrical Stimulation;Therapeutic exercise;Therapeutic activities;Balance training;Patient/family education;Manual techniques;Vasopneumatic Device;Passive range of motion    PT Next Visit Plan  update HEP to strengthening exercises, check remaining goals    PT Home Exercise Plan  Access Code: [email or web address]    Consulted and Agree with Plan of Care  Patient       Patient will benefit from skilled therapeutic intervention in order to improve the following deficits and impairments:  Abnormal gait, Decreased range of motion, Difficulty walking, Decreased endurance, Decreased activity tolerance, Pain, Decreased strength, Decreased balance  Visit Diagnosis: Muscle weakness (generalized) - Plan: PT plan of care cert/re-cert  Acute pain of right knee - Plan: PT plan of care cert/re-cert  Abnormality of gait - Plan: PT plan of care cert/re-cert  Unsteadiness on feet - Plan: PT plan of care cert/re-cert     Problem List Patient Active Problem List   Diagnosis Date Noted  . Status post total right knee replacement 12/27/2018  . Unilateral primary osteoarthritis, right knee 11/27/2018  . Rib fracture 04/22/2018  . Pneumothorax on left 01/24/2016  . Abnormal CT of the chest   . Pulmonary embolism ([place])   . Interstitial lung disease ([place]) 10/08/2015  . Paroxysmal atrial fibrillation ([facility])   . Typical atrial flutter ([place])   . Chronic diastolic heart failure ([place]) 08/25/2014  . Lumbar stenosis 08/11/2014  . On amiodarone therapy 05/21/2013  .  Hypercholesteremia   . Essential hypertension   . Myalgia and myositis, unspecified   . COPD GOLD 0    . Overactive bladder       Laureen Abrahams, PT, DPT 02/24/19 10:55 AM     [facility] Physical Therapy [address] [place], [place], [address] Phone: [phone]   Fax:  [phone]  Name: KIMMORA RISENHOOVER MRN: [id number] Date of Birth: January 26, 1941

## 2019-02-26 ENCOUNTER — Encounter: Payer: Self-pay | Admitting: Physical Therapy

## 2019-02-26 ENCOUNTER — Other Ambulatory Visit: Payer: Self-pay

## 2019-02-26 ENCOUNTER — Ambulatory Visit (INDEPENDENT_AMBULATORY_CARE_PROVIDER_SITE_OTHER): Payer: Medicare Other | Admitting: Physical Therapy

## 2019-02-26 DIAGNOSIS — R269 Unspecified abnormalities of gait and mobility: Secondary | ICD-10-CM | POA: Diagnosis not present

## 2019-02-26 DIAGNOSIS — M25561 Pain in right knee: Secondary | ICD-10-CM | POA: Diagnosis not present

## 2019-02-26 DIAGNOSIS — R2681 Unsteadiness on feet: Secondary | ICD-10-CM

## 2019-02-26 DIAGNOSIS — M6281 Muscle weakness (generalized): Secondary | ICD-10-CM | POA: Diagnosis not present

## 2019-02-26 NOTE — Patient Instructions (Signed)
Access Code: [email or web address]  URL: [email or web address]  Date: 02/26/2019  Prepared by: Faustino Congress   Exercises Supine Quad Set - 10 reps - 1 sets - 3x daily - 7x weekly Supine Knee Extension Mobilization with Weight - 1 reps - 1 sets - 3-5 min hold - 3x daily - 7x weekly Supine Heel Slide with Strap - 10 reps - 1 sets - 3x daily - 7x weekly Seated Knee Flexion Stretch - 10 reps - 1 sets - 10 sec hold - 5x daily - 7x weekly Squat with Chair Touch - 10 reps - 1 sets - 1x daily - 7x weekly Deadlift with Resistance - 10 reps - 1 sets - 1x daily - 7x weekly Forward Step Up - 10 reps - 1 sets - 1x daily - 7x weekly

## 2019-02-26 NOTE — Therapy (Addendum)
[facility] Physical Therapy [address] [place], [place], [address] Phone: [phone]   Fax:  [phone]  Physical Therapy Treatment/Discharge Summary  Patient Details  Name: Judy White MRN: [id number] Date of Birth: [birth year] Referring Provider (PT): Jean Rosenthal MD    Encounter Date: 02/26/2019  PT End of Session - 02/26/19 1152    Visit Number  15    Number of Visits  19    Date for PT Re-Evaluation  03/24/19    PT Start Time  1010    PT Stop Time  1055    PT Time Calculation (min)  45 min    Activity Tolerance  Patient tolerated treatment well    Behavior During Therapy  [facility] for tasks assessed/performed       Past Medical History:  Diagnosis Date  . Agoraphobia   . Anxiety   . Arthritis    "normal for the age; nothing gives me problems" (01/24/2016)  . COPD (chronic obstructive pulmonary disease) ([place])    "mild" (01/24/2016)  . Depressive disorder, not elsewhere classified   . DVT (deep venous thrombosis) ([place]) 10/08/2015   "they said the blood clot went from one of my legs to my lung"  . History of hiatal hernia    "small one" (01/24/2016)  . Hypercholesteremia   . Hypertension    pt denies this hx on 01/24/2016  . Myalgia and myositis, unspecified   . Osteopenia   . Overactive bladder   . Persistent atrial fibrillation ([place])    a. s/p Watchman implant 12/2014  . Presence of Watchman left atrial appendage closure device   . Pulmonary embolism ([place]) 10/08/2015  . Sleep apnea    USES CPAP     Past Surgical History:  Procedure Laterality Date  . APPENDECTOMY  1983  . ATRIAL FIBRILLATION ABLATION  06/28/2017   PER PATIENT SUCCESSFUL ABLATION , CARDIO [place] , [place]  . BACK SURGERY    . BRONCHOSCOPY  01/24/2016  . CARDIAC CATHETERIZATION     >5 yrs  . CATARACT EXTRACTION W/ INTRAOCULAR LENS  IMPLANT, BILATERAL Bilateral   . CESAREAN SECTION  1967  . LEFT ATRIAL APPENDAGE OCCLUSION N/A  12/31/2014   Procedure: LEFT ATRIAL APPENDAGE OCCLUSION;  Surgeon: Thompson Grayer, MD;  Location: [place] [facility];  Service: Cardiovascular;  Laterality: N/A;  . POSTERIOR FUSION LUMBAR SPINE Bilateral 07/2014  . SHOULDER ARTHROSCOPY W/ ROTATOR CUFF REPAIR Left   . SHOULDER SURGERY Right    "tendon broke; couldn't be repaired"  . TEE WITHOUT CARDIOVERSION N/A 12/22/2014   Procedure: TRANSESOPHAGEAL ECHOCARDIOGRAM (TEE);  Surgeon: Pixie Casino, MD;  Location: [facility] ENDOSCOPY;  Service: Cardiovascular;  Laterality: N/A;  . TEE WITHOUT CARDIOVERSION N/A 02/16/2015   post Watchman TEE with good seal and no leak around device   . TONSILLECTOMY    . TOOTH EXTRACTION  05/2018  . TOTAL ABDOMINAL HYSTERECTOMY    . TOTAL KNEE ARTHROPLASTY Right 12/27/2018   Procedure: RIGHT TOTAL KNEE ARTHROPLASTY;  Surgeon: Mcarthur Rossetti, MD;  Location: [facility];  Service: Orthopedics;  Laterality: Right;  Marland Kitchen VIDEO BRONCHOSCOPY Bilateral 01/24/2016   Procedure: VIDEO BRONCHOSCOPY WITH FLUORO;  Surgeon: Collene Gobble, MD;  Location: [place];  Service: Cardiopulmonary;  Laterality: Bilateral;  . WISDOM TOOTH EXTRACTION      There were no vitals filed for this visit.  Subjective Assessment - 02/26/19 1014    Subjective  doing well today.    Pertinent History  COPD,  Hx of DVT/PE, HTN, osteopenia    Limitations  Standing;Walking    Patient Stated Goals  'get back to normal', mow grass, walking without pain, get rid of AD    Currently in Pain?  No/denies         [facility] PT Assessment - 02/26/19 1034      Assessment   Medical Diagnosis  R TKR    Referring Provider (PT)  Jean Rosenthal MD     Onset Date/Surgical Date  12/27/18    Next MD Visit  03/05/2019      AROM   Right Knee Extension  0    Right Knee Flexion  117                   [facility] Adult PT Treatment/Exercise - 02/26/19 1014      Ambulation/Gait   Stairs  Yes    Stairs Assistance  6: Modified independent  (Device/Increase time)    Stair Management Technique  One rail Right;Alternating pattern    Number of Stairs  10    Height of Stairs  6      Knee/Hip Exercises: Stretches   Knee: Self-Stretch to increase Flexion  Right;3 reps;20 seconds   seated knee flexion with forward scoot     Knee/Hip Exercises: Aerobic   Recumbent Bike  x8 min; full revolutions - seat 6      Knee/Hip Exercises: Standing   Forward Step Up  Right;10 reps;Hand Hold: 1;Step Height: 6"    Functional Squat  10 reps    Functional Squat Limitations  with chair behind    Other Standing Knee Exercises  deadlifts with Lt band x 10 reps      Knee/Hip Exercises: Supine   Quad Sets  AROM;Right;20 reps    Heel Slides  AAROM;Right;10 reps    Heel Prop for Knee Extension  3 minutes;Weight    Heel Prop for Knee Extension Weight (lbs)  5             PT Education - 02/26/19 1152    Education Details  HEP    Person(s) Educated  Patient    Methods  Explanation;Demonstration;Handout    Comprehension  Verbalized understanding;Returned demonstration       PT Short Term Goals - 01/27/19 1138      PT SHORT TERM GOAL #1   Title  Pt demonstrates full independence with initial HEP.    Baseline  relays compliance and does not have questions    Time  2    Period  Weeks    Status  Achieved    Target Date  01/23/19        PT Long Term Goals - 02/26/19 1153      PT LONG TERM GOAL #1   Title  Pt is fully independent with advanced HEP.    Time  6    Period  Weeks    Status  Achieved      PT LONG TERM GOAL #2   Title  Pt demonstrates R knee AROM 0-115 for functional mobility and independence.    Baseline  12/28:0-117    Time  6    Period  Weeks    Status  Achieved      PT LONG TERM GOAL #3   Title  Pt demonstrates ability to perform steps independently for functional stair ambulation to get into home.    Time  6    Period  Weeks    Status  Achieved  PT LONG TERM GOAL #4   Title  Pt demonstrates ability  to safely ambulate without and AD for functional gait and independence.    Baseline  01/09/19: enter/exit with SPC    Time  6    Period  Weeks    Status  Achieved            Plan - 02/26/19 1153    Clinical Impression Statement  Pt has met all LTGs and anticipate she will be ready for d/c.  Plan to hold PT until after MD appt, and will reassess if needed.  Otherwise will plan for d/c.    Personal Factors and Comorbidities  Age;Comorbidity 3+;Sex    Comorbidities  COPD, HTN, Osteopenia    Examination-Activity Limitations  Bathing;Bed Mobility;Locomotion Level;Stand;Stairs;Transfers;Dressing;Toileting    Examination-Participation Restrictions  Community Activity    Rehab Potential  Good    PT Frequency  3x / week    PT Duration  6 weeks    PT Treatment/Interventions  Cryotherapy;Gait training;Stair training;Neuromuscular re-education;Electrical Stimulation;Therapeutic exercise;Therapeutic activities;Balance training;Patient/family education;Manual techniques;Vasopneumatic Device;Passive range of motion    PT Next Visit Plan  see what MD says; plan for d/c    PT Home Exercise Plan  Access Code: [email or web address]    Consulted and Agree with Plan of Care  Patient       Patient will benefit from skilled therapeutic intervention in order to improve the following deficits and impairments:  Abnormal gait, Decreased range of motion, Difficulty walking, Decreased endurance, Decreased activity tolerance, Pain, Decreased strength, Decreased balance  Visit Diagnosis: Muscle weakness (generalized)  Acute pain of right knee  Abnormality of gait  Unsteadiness on feet     Problem List Patient Active Problem List   Diagnosis Date Noted  . Status post total right knee replacement 12/27/2018  . Unilateral primary osteoarthritis, right knee 11/27/2018  . Rib fracture 04/22/2018  . Pneumothorax on left 01/24/2016  . Abnormal CT of the chest   . Pulmonary embolism ([place])   . Interstitial lung  disease ([place]) 10/08/2015  . Paroxysmal atrial fibrillation ([facility])   . Typical atrial flutter ([place])   . Chronic diastolic heart failure ([place]) 08/25/2014  . Lumbar stenosis 08/11/2014  . On amiodarone therapy 05/21/2013  . Hypercholesteremia   . Essential hypertension   . Myalgia and myositis, unspecified   . COPD GOLD 0    . Overactive bladder       Laureen Abrahams, PT, DPT 02/26/19 11:57 AM     [facility] Physical Therapy [address] [place], [place], [address] Phone: [phone]   Fax:  [phone]  Name: RAYNEISHA BOUZA MRN: [id number] Date of Birth: 03/16/1940     PHYSICAL THERAPY DISCHARGE SUMMARY  Visits from Start of Care: 15  Current functional level related to goals / functional outcomes: See above   Remaining deficits: n/a   Education / Equipment: HEP  Plan: Patient agrees to discharge.  Patient goals were met. Patient is being discharged due to meeting the stated rehab goals.  ?????    Laureen Abrahams, PT, DPT 03/05/19 3:36 PM [facility] [address] [place], [place], [address] Phone: [phone]   Fax:  [phone]

## 2019-02-27 ENCOUNTER — Encounter: Payer: Medicare Other | Admitting: Physical Therapy

## 2019-03-03 ENCOUNTER — Other Ambulatory Visit: Payer: Self-pay | Admitting: Interventional Cardiology

## 2019-03-04 ENCOUNTER — Other Ambulatory Visit: Payer: Self-pay

## 2019-03-04 ENCOUNTER — Other Ambulatory Visit: Payer: Self-pay | Admitting: Interventional Cardiology

## 2019-03-04 NOTE — Progress Notes (Signed)
Cardiology Office Note:    Date:  03/06/2019   ID:  Judy White, DOB 12-10-1940, MRN [id number]  PCP:  Judy Huddle, MD  Cardiologist:  Judy Grooms, MD   Referring MD: Judy Huddle, MD   Chief Complaint  Patient presents with  . Atrial Fibrillation  . Chest Pain    History of Present Illness:    Judy White is a 79 y.o. female with a hx of paroxysmal atrial fibrillation, amiodarone therapy, diastolic heart failure, unable to use anticoagulation due to intracranial hemorrhage, essential hypertension,and anxiety disorder.   Judy White is doing well.  She denies this palpitations, angina, syncope, edema, and chest pain.  No longer on anticoagulation therapy.  Take your medications as outlined below.  Past Medical History:  Diagnosis Date  . Agoraphobia   . Anxiety   . Arthritis    "normal for the age; nothing gives me problems" (01/24/2016)  . COPD (chronic obstructive pulmonary disease) ([place])    "mild" (01/24/2016)  . Depressive disorder, not elsewhere classified   . DVT (deep venous thrombosis) ([place]) 10/08/2015   "they said the blood clot went from one of my legs to my lung"  . History of hiatal hernia    "small one" (01/24/2016)  . Hypercholesteremia   . Hypertension    pt denies this hx on 01/24/2016  . Myalgia and myositis, unspecified   . Osteopenia   . Overactive bladder   . Persistent atrial fibrillation ([place])    a. s/p Watchman implant 12/2014  . Presence of Watchman left atrial appendage closure device   . Pulmonary embolism ([place]) 10/08/2015  . Sleep apnea    USES CPAP     Past Surgical History:  Procedure Laterality Date  . APPENDECTOMY  1983  . ATRIAL FIBRILLATION ABLATION  06/28/2017   PER PATIENT SUCCESSFUL ABLATION , CARDIO [place] , [place]  . BACK SURGERY    . BRONCHOSCOPY  01/24/2016  . CARDIAC CATHETERIZATION     >5 yrs  . CATARACT EXTRACTION W/ INTRAOCULAR LENS  IMPLANT, BILATERAL Bilateral   . CESAREAN SECTION   1967  . LEFT ATRIAL APPENDAGE OCCLUSION N/A 12/31/2014   Procedure: LEFT ATRIAL APPENDAGE OCCLUSION;  Surgeon: Thompson Grayer, MD;  Location: [facility];  Service: Cardiovascular;  Laterality: N/A;  . POSTERIOR FUSION LUMBAR SPINE Bilateral 07/2014  . SHOULDER ARTHROSCOPY W/ ROTATOR CUFF REPAIR Left   . SHOULDER SURGERY Right    "tendon broke; couldn't be repaired"  . TEE WITHOUT CARDIOVERSION N/A 12/22/2014   Procedure: TRANSESOPHAGEAL ECHOCARDIOGRAM (TEE);  Surgeon: Pixie Casino, MD;  Location: [facility] ENDOS[facility];  Service: Cardiovascular;  Laterality: N/A;  . TEE WITHOUT CARDIOVERSION N/A 02/16/2015   post Watchman TEE with good seal and no leak around device   . TONSILLECTOMY    . TOOTH EXTRACTION  05/2018  . TOTAL ABDOMINAL HYSTERECTOMY    . TOTAL KNEE ARTHROPLASTY Right 12/27/2018   Procedure: RIGHT TOTAL KNEE ARTHROPLASTY;  Surgeon: Mcarthur Rossetti, MD;  Location: [facility];  Service: Orthopedics;  Laterality: Right;  Marland Kitchen VIDEO BRONCHOSCOPY Bilateral 01/24/2016   Procedure: VIDEO BRONCHOSCOPY WITH FLUORO;  Surgeon: Collene Gobble, MD;  Location: [facility];  Service: Cardiopulmonary;  Laterality: Bilateral;  . WISDOM TOOTH EXTRACTION      Current Medications: Current Meds  Medication Sig  . acetaminophen (TYLENOL) 500 MG tablet Take 500 mg by mouth every 6 (six) hours as needed for moderate pain or headache.  Marland Kitchen aspirin  EC 81 MG tablet Take 81 mg by mouth daily.  . Benfotiamine 150 MG CAPS Take 150 mg by mouth daily.  . Calcium-Vitamin D (CALTRATE 600 PLUS-VIT D PO) Take 1 tablet by mouth daily.   . Cholecalciferol (VITAMIN D3) 2000 units TABS Take 2,000 Units by mouth daily.  . clonazePAM (KLONOPIN) 1 MG tablet Take 1.5-2 mg by mouth daily.   Marland Kitchen diltiazem (CARDIZEM) 30 MG tablet Take 30 mg by mouth 4 (four) times daily as needed (irregular heartbeat).   Marland Kitchen FLUoxetine (PROZAC) 20 MG capsule Take 20 mg by mouth daily.   . furosemide (LASIX) 20 MG tablet TAKE 1 TABLET ONCE  DAILY.  Marland Kitchen gabapentin (NEURONTIN) 100 MG capsule Take 100 mg by mouth 3 (three) times daily.  . metoprolol succinate (TOPROL-XL) 25 MG 24 hr tablet Take 12.5 mg by mouth every morning.  Marland Kitchen oxybutynin (DITROPAN-XL) 10 MG 24 hr tablet Take 10 mg by mouth daily.   Marland Kitchen oxyCODONE (OXY IR/ROXICODONE) 5 MG immediate release tablet TAKE 1-2 TABLETS BY MOUTH EVERY 6 HOURS AS NEEDED FOR MODERATE PAIN (PAIN SCORE 4-6).  . Potassium 99 MG TABS Take 99 mg by mouth daily.  . vitamin C (ASCORBIC ACID) 500 MG tablet Take 500 mg by mouth daily.  . vitamin E (VITAMIN E) 400 UNIT capsule Take 400 Units daily by mouth.     Allergies:   Rosuvastatin and Statins   Social History   Socioeconomic History  . Marital status: Married    Spouse name: Not on file  . Number of children: Not on file  . Years of education: Not on file  . Highest education level: Not on file  Occupational History  . Not on file  Tobacco Use  . Smoking status: Former Smoker    Packs/day: 0.50    Years: 60.00    Pack years: 30.00    Types: Cigarettes    Quit date: 10/08/2015    Years since quitting: 3.4  . Smokeless tobacco: Never Used  Substance and Sexual Activity  . Alcohol use: Yes    Alcohol/week: 0.0 standard drinks    Comment: occ  . Drug use: No  . Sexual activity: Not on file  Other Topics Concern  . Not on file  Social History Narrative  . Not on file   Social Determinants of Health   Financial Resource Strain:   . Difficulty of Paying Living Expenses: Not on file  Food Insecurity:   . Worried About Charity fundraiser in the Last Year: Not on file  . Ran Out of Food in the Last Year: Not on file  Transportation Needs:   . Lack of Transportation (Medical): Not on file  . Lack of Transportation (Non-Medical): Not on file  Physical Activity:   . Days of Exercise per Week: Not on file  . Minutes of Exercise per Session: Not on file  Stress:   . Feeling of Stress : Not on file  Social Connections:   .  Frequency of Communication with Friends and Family: Not on file  . Frequency of Social Gatherings with Friends and Family: Not on file  . Attends Religious Services: Not on file  . Active Member of Clubs or Organizations: Not on file  . Attends Archivist Meetings: Not on file  . Marital Status: Not on file     Family History: The patient's family history includes Aneurysm in her father; Cancer in an other family member; Heart Problems in an other family  member; Heart disease in her brother; Hypotension in her mother; Neuropathy in her brother. There is no history of Heart attack.  ROS:   Please see the history of present illness.    Decreased memory.  Anxiety.  Having a difficult time at home because of her husband's decreased memory.  All other systems reviewed and are negative.  EKGs/Labs/Other Studies Reviewed:    The following studies were reviewed today: No new data  EKG:  EKG performed December 23, 2018 reveals sinus bradycardia with low voltage.  Recent Labs: 12/28/2018: BUN 19; Creatinine, Ser 0.79; Hemoglobin 12.3; Platelets 205; Potassium 4.8; Sodium 138  Recent Lipid Panel    Component Value Date/Time   CHOL 185 12/28/2017 0927   TRIG 218 (H) 12/28/2017 0927   HDL 41 12/28/2017 0927   CHOLHDL 4.5 (H) 12/28/2017 0927   LDLCALC 100 (H) 12/28/2017 0927    Physical Exam:    VS:  BP 112/68   Pulse 65   Ht 5\' 6"  (1.676 m)   Wt 185 lb 12.8 oz (84.3 kg)   SpO2 97%   BMI 29.99 kg/m     Wt Readings from Last 3 Encounters:  03/06/19 185 lb 12.8 oz (84.3 kg)  12/27/18 190 lb 5 oz (86.3 kg)  12/23/18 190 lb 5 oz (86.3 kg)     GEN: Compatible with age. No acute distress HEENT: Normal NECK: No JVD. LYMPHATICS: No lymphadenopathy CARDIAC:  RRR without murmur, gallop, or edema. VASCULAR:  Normal Pulses. No bruits. RESPIRATORY:  Clear to auscultation without rales, wheezing or rhonchi  ABDOMEN: Soft, non-tender, non-distended, No pulsatile  mass, MUSCULOSKELETAL: No deformity  SKIN: Warm and dry NEUROLOGIC:  Alert and oriented x 3 PSYCHIATRIC:  Normal affect   ASSESSMENT:    1. Paroxysmal atrial fibrillation (HCC)   2. On amiodarone therapy   3. Chronic diastolic heart failure ([place])   4. Essential hypertension   5. COPD GOLD 0    6. Long-term current use of high risk medication other than anticoagulant   7. Educated about COVID-19 virus infection    PLAN:    In order of problems listed above:  1. Maintaining sinus rhythm 2. Discontinued.  On diltiazem and low-dose metoprolol. 3. Volume is controlled on furosemide.  We contemplated whether or not to discontinue furosemide. 4. Blood pressure is excellent 5. No discussion.   6. Anticoagulation has been discontinued.  Taking aspirin daily 7. 3W's is being practiced to avoid COVID-19 infection   Medication Adjustments/Labs and Tests Ordered: Current medicines are reviewed at length with the patient today.  Concerns regarding medicines are outlined above.  No orders of the defined types were placed in this encounter.  No orders of the defined types were placed in this encounter.   There are no Patient Instructions on file for this visit.   Signed, Judy Grooms, MD  03/06/2019 11:15 AM    [place]

## 2019-03-04 NOTE — Telephone Encounter (Signed)
Pt's medication has already been sent to pt's pharmacy as requested, confirmation received.

## 2019-03-05 ENCOUNTER — Ambulatory Visit (INDEPENDENT_AMBULATORY_CARE_PROVIDER_SITE_OTHER): Payer: Medicare Other | Admitting: Orthopaedic Surgery

## 2019-03-05 ENCOUNTER — Other Ambulatory Visit: Payer: Self-pay

## 2019-03-05 ENCOUNTER — Encounter: Payer: Self-pay | Admitting: Orthopaedic Surgery

## 2019-03-05 DIAGNOSIS — Z96651 Presence of right artificial knee joint: Secondary | ICD-10-CM

## 2019-03-05 NOTE — Progress Notes (Signed)
The patient is almost 10 weeks status post a right total knee arthroplasty.  She says is still little bit warm and sore but she has been going to physical therapy and her range of motion and strength are increasing.  She is 79 years old.  She is walking without assistive device.  On exam her extension is full and her flexion is to past 100 degrees this afternoon I can actually push her a little bit further.  The knee is ligamentously stable overall and the patella tracks well.  She has made such good progress today we will have her just transition to home exercise program.  I would like to see her back for a final visit in about 3 months.  At that visit I would like an AP and lateral of her right operative knee.  All question concerns were answered and addressed.

## 2019-03-06 ENCOUNTER — Encounter: Payer: Self-pay | Admitting: Interventional Cardiology

## 2019-03-06 ENCOUNTER — Ambulatory Visit (INDEPENDENT_AMBULATORY_CARE_PROVIDER_SITE_OTHER): Payer: Medicare Other | Admitting: Interventional Cardiology

## 2019-03-06 VITALS — BP 112/68 | HR 65 | Ht 66.0 in | Wt 185.8 lb

## 2019-03-06 DIAGNOSIS — I5032 Chronic diastolic (congestive) heart failure: Secondary | ICD-10-CM | POA: Diagnosis not present

## 2019-03-06 DIAGNOSIS — I10 Essential (primary) hypertension: Secondary | ICD-10-CM

## 2019-03-06 DIAGNOSIS — Z79899 Other long term (current) drug therapy: Secondary | ICD-10-CM | POA: Diagnosis not present

## 2019-03-06 DIAGNOSIS — J449 Chronic obstructive pulmonary disease, unspecified: Secondary | ICD-10-CM | POA: Diagnosis not present

## 2019-03-06 DIAGNOSIS — Z7189 Other specified counseling: Secondary | ICD-10-CM

## 2019-03-06 DIAGNOSIS — I480 Paroxysmal atrial fibrillation: Secondary | ICD-10-CM | POA: Diagnosis not present

## 2019-03-06 NOTE — Patient Instructions (Signed)

## 2019-03-10 ENCOUNTER — Ambulatory Visit: Payer: Medicare Other

## 2019-03-10 ENCOUNTER — Ambulatory Visit: Payer: Medicare Other | Attending: Internal Medicine

## 2019-03-10 DIAGNOSIS — Z23 Encounter for immunization: Secondary | ICD-10-CM | POA: Diagnosis not present

## 2019-03-10 NOTE — Progress Notes (Signed)
   Covid-19 Vaccination Clinic  Name:  KATIRA BACAK    MRN: [id number] DOB: [birth year]  03/10/2019  Ms. Kasson was observed post Covid-19 immunization for 15 minutes without incidence. She was provided with Vaccine Information Sheet and instruction to access the V-Safe system.   Ms. Theall was instructed to call 911 with any severe reactions post vaccine: Marland Kitchen Difficulty breathing  . Swelling of your face and throat  . A fast heartbeat  . A bad rash all over your body  . Dizziness and weakness    Immunizations Administered    Name Date Dose VIS Date Route   Pfizer COVID-19 Vaccine 03/10/2019  8:48 AM 0.3 mL 02/07/2019 Intramuscular   Manufacturer: [place]   Lot: [id number]   [place]: [id number]

## 2019-03-13 ENCOUNTER — Telehealth: Payer: Self-pay | Admitting: *Deleted

## 2019-03-13 ENCOUNTER — Encounter: Payer: Medicare Other | Admitting: Physical Therapy

## 2019-03-13 MED ORDER — HYDROCODONE-ACETAMINOPHEN 5-325 MG PO TABS: 1.0000 | ORAL_TABLET | Freq: Four times a day (QID) | ORAL | 0 refills | Status: DC | PRN

## 2019-03-13 NOTE — Telephone Encounter (Signed)
Ortho bundle call completed. 

## 2019-03-13 NOTE — Telephone Encounter (Signed)
I sent in some hydrocodone for her to try.  I sent it to [facility].

## 2019-03-13 NOTE — Telephone Encounter (Signed)
Patient called and said she still feels like she needs some pain medication just occasionally after therapy and at night to help with sleep. Requested if she can get refill or possibly something not as strong as the Oxycodone. Otherwise doing well. Thanks.

## 2019-03-13 NOTE — Telephone Encounter (Signed)
Thank you.  I will call and let her know.

## 2019-03-14 ENCOUNTER — Encounter: Payer: Medicare Other | Admitting: Physical Therapy

## 2019-03-30 ENCOUNTER — Ambulatory Visit: Payer: Medicare Other | Attending: Internal Medicine

## 2019-03-30 DIAGNOSIS — Z23 Encounter for immunization: Secondary | ICD-10-CM | POA: Insufficient documentation

## 2019-03-30 NOTE — Progress Notes (Signed)
   Covid-19 Vaccination Clinic  Name:  Judy White    MRN: [id number] DOB: 28-Feb-1940  03/30/2019  [name] was observed post Covid-19 immunization for 30 minutes based on pre-vaccination screening without incidence. She was provided with Vaccine Information Sheet and instruction to access the V-Safe system.   [name] was instructed to call 911 with any severe reactions post vaccine: Marland Kitchen Difficulty breathing  . Swelling of your face and throat  . A fast heartbeat  . A bad rash all over your body  . Dizziness and weakness    Immunizations Administered    Name Date Dose VIS Date Route   Pfizer COVID-19 Vaccine 03/30/2019 12:56 PM 0.3 mL 02/07/2019 Intramuscular   Manufacturer: [place]   Lot: [id number]   [place]: [id number]

## 2019-03-31 ENCOUNTER — Telehealth: Payer: Self-pay | Admitting: *Deleted

## 2019-03-31 NOTE — Telephone Encounter (Signed)
90 day Ortho bundle call and survey completed.

## 2019-03-31 NOTE — Care Plan (Signed)
Ortho bundle 90 day call completed. Patient states she has completed OPPT. Reviewed Sydnee Levans. And results as documented. Patient is aware of how to contact CM for any other needs. F/U with Dr. Ninfa Linden in April 2021.

## 2019-04-17 ENCOUNTER — Ambulatory Visit: Payer: Medicare Other | Admitting: Emergency Medicine

## 2019-05-05 ENCOUNTER — Encounter: Payer: Self-pay | Admitting: Emergency Medicine

## 2019-05-05 ENCOUNTER — Ambulatory Visit (INDEPENDENT_AMBULATORY_CARE_PROVIDER_SITE_OTHER): Payer: Medicare Other | Admitting: Emergency Medicine

## 2019-05-05 ENCOUNTER — Other Ambulatory Visit: Payer: Self-pay

## 2019-05-05 VITALS — BP 110/60 | HR 59 | Temp 97.2°F | Ht 66.0 in | Wt 184.8 lb

## 2019-05-05 DIAGNOSIS — R197 Diarrhea, unspecified: Secondary | ICD-10-CM | POA: Diagnosis not present

## 2019-05-05 DIAGNOSIS — R1115 Cyclical vomiting syndrome unrelated to migraine: Secondary | ICD-10-CM

## 2019-05-05 DIAGNOSIS — J849 Interstitial pulmonary disease, unspecified: Secondary | ICD-10-CM

## 2019-05-05 DIAGNOSIS — I2699 Other pulmonary embolism without acute cor pulmonale: Secondary | ICD-10-CM | POA: Diagnosis not present

## 2019-05-05 NOTE — Assessment & Plan Note (Signed)
Completed her full course of anticoagulation.

## 2019-05-05 NOTE — Patient Instructions (Signed)
We do not need to repeat your CT scan of the chest unless you develop new symptoms. Continue your CPAP every night as you have been using it We will hold off on starting any inhaled medication at this time.  Please let us know if your breathing changes. We will refer you to see gastroenterology since you are having persistent abdominal pain, vomiting, diarrhea. Follow with Dr. Lamonte Sakai if needed

## 2019-05-05 NOTE — Progress Notes (Signed)
History of Present Illness  ROV 04/22/2018 --Judy White is 79 with a history of tobacco use and pulmonary embolism, atrial fibrillation for which she is previously been on amiodarone and ablated last year, remote intracranial hemorrhage.  We followed her for some mild obstructive lung disease and an abnormal CT scan of the chest that included some scattered groundglass infiltrate.  As mentioned no longer on amiodarone, is now off anticoagulation.  A bronchoscopy from 2017 was culture negative, overall reassuring.  We have followed the findings with serial CT scans and her abnormalities were smaller 04/12/2017.  We have done a trial of bronchodilators in the past but she never seem to get much benefit. She unfortunately fell last Monday, fractured her R 8th rib. She has a lot of pain w respiration since the fall, has had some wheeze as well. She is doing IS.   ROV 05/05/2019 --79 year old woman with a history of tobacco and mild COPD which been following off bronchodilators.  She also has a history of PE, completed anticoagulation therapy (now off), atrial fibrillation (ablated with Watchman in place). She is on CPAP every night w good compliance.  We followed an abnormal CT scan of the chest with some scattered groundglass infiltrates, previously on amiodarone.  Her most recent CT from 04/12/2017 showed improvement. We did not repeat. She denies any dyspnea. She is dealing with intermittent emesis, diarrhea that has been bothering her for over 3 months. Associated with some mid-abdominal pain.    Review Of Systems: As per HPI  Vital Signs BP 110/60 (BP Location: Right Arm, Cuff Size: Normal)   Pulse (!) 59   Temp (!) 97.2 F (36.2 C) (Temporal)   Ht 5\' 6"  (1.676 m)   Wt 184 lb 12.8 oz (83.8 kg)   SpO2 96% Comment: RA  BMI 29.83 kg/m   Vitals:   05/05/19 1214  BP: 110/60  Pulse: (!) 59  Temp: (!) 97.2 F (36.2 C)  TempSrc: Temporal  SpO2: 96%  Weight: 184 lb 12.8 oz (83.8 kg)  Height: 5\' 6"   (1.676 m)   Physical Exam: Gen: Pleasant, well-nourished, in no distress,  normal affect  ENT: No lesions,  mouth clear,  oropharynx clear, no postnasal drip  Neck: No JVD, no stridor  Lungs: No use of accessory muscles, no crackles or wheezing on normal respiration, no wheeze on forced expiration  Cardiovascular: RRR, heart sounds normal, no murmur or gallops, no peripheral edema  Abdominal: Obese, very mild diffuse tenderness to palpation without rebound or guarding.  Musculoskeletal: No deformities, no cyanosis or clubbing  Neuro: alert, awake, non focal  Skin: Warm, no lesions or rashes   Assessment/Plan  COPD GOLD 0  Following off bronchodilator therapy.  She denies any dyspnea.  Did not respond in the past to empiric Spiriva  Pulmonary embolism ([place]) Completed her full course of anticoagulation.  Interstitial lung disease ([place]) Improved areas of groundglass opacity on her previous imaging.  She was on amiodarone, question whether there was a relationship with this medication.  In absence of progressive dyspnea I do not think we need to do dedicated serial imaging  Emesis, persistent She has had recurrent sudden emesis in the absence of nausea, question whether she may be having dysphagia, incomplete swallowing with reflux.  Associated with chronic diarrhea.  Etiology unclear.  I think she would benefit from a gastroenterology evaluation and I will make the referral today.    Baltazar Apo, MD, PhD 05/05/2019, 12:34 PM [facility] [phone] or if no answer [phone]

## 2019-05-05 NOTE — Assessment & Plan Note (Signed)
Following off bronchodilator therapy.  She denies any dyspnea.  Did not respond in the past to empiric Spiriva

## 2019-05-05 NOTE — Assessment & Plan Note (Signed)
She has had recurrent sudden emesis in the absence of nausea, question whether she may be having dysphagia, incomplete swallowing with reflux.  Associated with chronic diarrhea.  Etiology unclear.  I think she would benefit from a gastroenterology evaluation and I will make the referral today.

## 2019-05-05 NOTE — Assessment & Plan Note (Signed)
Improved areas of groundglass opacity on her previous imaging.  She was on amiodarone, question whether there was a relationship with this medication.  In absence of progressive dyspnea I do not think we need to do dedicated serial imaging

## 2019-05-28 DIAGNOSIS — R1032 Left lower quadrant pain: Secondary | ICD-10-CM | POA: Diagnosis not present

## 2019-05-28 DIAGNOSIS — D72829 Elevated white blood cell count, unspecified: Secondary | ICD-10-CM | POA: Diagnosis not present

## 2019-05-28 DIAGNOSIS — R197 Diarrhea, unspecified: Secondary | ICD-10-CM | POA: Diagnosis not present

## 2019-05-28 DIAGNOSIS — R112 Nausea with vomiting, unspecified: Secondary | ICD-10-CM | POA: Diagnosis not present

## 2019-05-29 ENCOUNTER — Other Ambulatory Visit: Payer: Self-pay | Admitting: Gastroenterology

## 2019-05-29 DIAGNOSIS — R1032 Left lower quadrant pain: Secondary | ICD-10-CM

## 2019-05-29 DIAGNOSIS — R109 Unspecified abdominal pain: Secondary | ICD-10-CM

## 2019-06-02 ENCOUNTER — Other Ambulatory Visit: Payer: Self-pay | Admitting: Interventional Cardiology

## 2019-06-02 DIAGNOSIS — R1032 Left lower quadrant pain: Secondary | ICD-10-CM | POA: Diagnosis not present

## 2019-06-03 ENCOUNTER — Other Ambulatory Visit: Payer: Self-pay

## 2019-06-03 ENCOUNTER — Ambulatory Visit (INDEPENDENT_AMBULATORY_CARE_PROVIDER_SITE_OTHER): Payer: Medicare Other

## 2019-06-03 ENCOUNTER — Encounter: Payer: Self-pay | Admitting: Orthopaedic Surgery

## 2019-06-03 ENCOUNTER — Ambulatory Visit (INDEPENDENT_AMBULATORY_CARE_PROVIDER_SITE_OTHER): Payer: Medicare Other | Admitting: Orthopaedic Surgery

## 2019-06-03 DIAGNOSIS — Z96651 Presence of right artificial knee joint: Secondary | ICD-10-CM

## 2019-06-03 NOTE — Progress Notes (Signed)
The patient is now 5 months status post a right total knee arthroplasty.  She is a very active 79 year old female who appears younger than her stated age.  She has done very well with this right knee replacement.  She reports a little bit of achiness at the lateral aspect of her knee.  She is recently being treated for bilateral foot pain and numbness with gabapentin.  This is per her primary care physician and so far has not given her any relief.  She is not a diabetic.  On examination of her right operative knee her extension is full and her flexion is full.  Her knee is ligamentously stable with no swelling.  There is only some mild lateral joint line tenderness.  2 views of the right knee show well-seated total knee arthroplasty with no complicating features.  I did examine her feet and showed no glaring deformities of her feet.  She does have midfoot achiness may be arthritic related with tenderness on stressing the midfoot of both feet.  There is some slight numbness as well.  From a knee standpoint follow-up can be as needed since she is doing so well.  If there is any issues with that knee at all including swelling or pain does not resolve she will come back and see [place].  From a neuropathy standpoint, she should continue Neurontin for further and even consider vitamin C and B6.  All questions concerns were answered and addressed.

## 2019-06-12 ENCOUNTER — Other Ambulatory Visit: Payer: Self-pay | Admitting: Gastroenterology

## 2019-06-12 ENCOUNTER — Encounter: Payer: Self-pay | Admitting: Emergency Medicine

## 2019-06-12 DIAGNOSIS — R1032 Left lower quadrant pain: Secondary | ICD-10-CM

## 2019-06-16 ENCOUNTER — Ambulatory Visit
Admission: RE | Admit: 2019-06-16 | Discharge: 2019-06-16 | Disposition: A | Discharge status: Home / Self Care | Payer: Medicare Other | Source: Ambulatory Visit | Attending: Gastroenterology | Admitting: Gastroenterology

## 2019-06-16 ENCOUNTER — Other Ambulatory Visit: Payer: Self-pay

## 2019-06-16 DIAGNOSIS — R1032 Left lower quadrant pain: Secondary | ICD-10-CM

## 2019-06-16 MED ORDER — IOPAMIDOL (ISOVUE-300) INJECTION 61%
100.0000 mL | Freq: Once | INTRAVENOUS | Status: AC | PRN
  Administered 2019-06-16: 100 mL via INTRAVENOUS

## 2019-06-24 DIAGNOSIS — R1032 Left lower quadrant pain: Secondary | ICD-10-CM | POA: Diagnosis not present

## 2019-06-24 DIAGNOSIS — R197 Diarrhea, unspecified: Secondary | ICD-10-CM | POA: Diagnosis not present

## 2019-06-24 DIAGNOSIS — R112 Nausea with vomiting, unspecified: Secondary | ICD-10-CM | POA: Diagnosis not present

## 2019-07-23 DIAGNOSIS — F3341 Major depressive disorder, recurrent, in partial remission: Secondary | ICD-10-CM | POA: Diagnosis not present

## 2019-08-05 DIAGNOSIS — Z1231 Encounter for screening mammogram for malignant neoplasm of breast: Secondary | ICD-10-CM | POA: Diagnosis not present

## 2019-08-06 DIAGNOSIS — G629 Polyneuropathy, unspecified: Secondary | ICD-10-CM | POA: Diagnosis not present

## 2019-10-22 DIAGNOSIS — Z0000 Encounter for general adult medical examination without abnormal findings: Secondary | ICD-10-CM | POA: Diagnosis not present

## 2019-10-22 DIAGNOSIS — H6120 Impacted cerumen, unspecified ear: Secondary | ICD-10-CM | POA: Diagnosis not present

## 2019-10-22 DIAGNOSIS — Z79899 Other long term (current) drug therapy: Secondary | ICD-10-CM | POA: Diagnosis not present

## 2019-10-22 DIAGNOSIS — G629 Polyneuropathy, unspecified: Secondary | ICD-10-CM | POA: Diagnosis not present

## 2019-10-22 DIAGNOSIS — Z13820 Encounter for screening for osteoporosis: Secondary | ICD-10-CM | POA: Diagnosis not present

## 2019-10-22 DIAGNOSIS — G720 Drug-induced myopathy: Secondary | ICD-10-CM | POA: Diagnosis not present

## 2019-10-22 DIAGNOSIS — F329 Major depressive disorder, single episode, unspecified: Secondary | ICD-10-CM | POA: Diagnosis not present

## 2019-10-22 DIAGNOSIS — E782 Mixed hyperlipidemia: Secondary | ICD-10-CM | POA: Diagnosis not present

## 2019-10-22 DIAGNOSIS — M179 Osteoarthritis of knee, unspecified: Secondary | ICD-10-CM | POA: Diagnosis not present

## 2019-10-22 DIAGNOSIS — G4733 Obstructive sleep apnea (adult) (pediatric): Secondary | ICD-10-CM | POA: Diagnosis not present

## 2019-10-22 DIAGNOSIS — I480 Paroxysmal atrial fibrillation: Secondary | ICD-10-CM | POA: Diagnosis not present

## 2019-10-22 DIAGNOSIS — E559 Vitamin D deficiency, unspecified: Secondary | ICD-10-CM | POA: Diagnosis not present

## 2019-10-22 DIAGNOSIS — Z7189 Other specified counseling: Secondary | ICD-10-CM | POA: Diagnosis not present

## 2019-10-27 DIAGNOSIS — H52201 Unspecified astigmatism, right eye: Secondary | ICD-10-CM | POA: Diagnosis not present

## 2019-10-27 DIAGNOSIS — Z961 Presence of intraocular lens: Secondary | ICD-10-CM | POA: Diagnosis not present

## 2019-10-27 DIAGNOSIS — H353121 Nonexudative age-related macular degeneration, left eye, early dry stage: Secondary | ICD-10-CM | POA: Diagnosis not present

## 2019-10-27 DIAGNOSIS — H35372 Puckering of macula, left eye: Secondary | ICD-10-CM | POA: Diagnosis not present

## 2019-10-28 DIAGNOSIS — L820 Inflamed seborrheic keratosis: Secondary | ICD-10-CM | POA: Diagnosis not present

## 2019-10-28 DIAGNOSIS — Z85828 Personal history of other malignant neoplasm of skin: Secondary | ICD-10-CM | POA: Diagnosis not present

## 2019-10-28 DIAGNOSIS — L821 Other seborrheic keratosis: Secondary | ICD-10-CM | POA: Diagnosis not present

## 2019-10-28 DIAGNOSIS — D1801 Hemangioma of skin and subcutaneous tissue: Secondary | ICD-10-CM | POA: Diagnosis not present

## 2019-11-11 DIAGNOSIS — Z780 Asymptomatic menopausal state: Secondary | ICD-10-CM | POA: Diagnosis not present

## 2019-11-11 DIAGNOSIS — M85852 Other specified disorders of bone density and structure, left thigh: Secondary | ICD-10-CM | POA: Diagnosis not present

## 2019-11-11 DIAGNOSIS — M85851 Other specified disorders of bone density and structure, right thigh: Secondary | ICD-10-CM | POA: Diagnosis not present

## 2019-11-17 DIAGNOSIS — G4733 Obstructive sleep apnea (adult) (pediatric): Secondary | ICD-10-CM | POA: Diagnosis not present

## 2019-12-13 ENCOUNTER — Ambulatory Visit: Payer: Medicare Other | Attending: Internal Medicine

## 2019-12-13 DIAGNOSIS — Z23 Encounter for immunization: Secondary | ICD-10-CM

## 2019-12-13 NOTE — Progress Notes (Signed)
   Covid-19 Vaccination Clinic  Name:  Judy White    MRN: [id number] DOB: 06/16/1940  12/13/2019  [name] was observed post Covid-19 immunization for 15 minutes without incident. She was provided with Vaccine Information Sheet and instruction to access the V-Safe system.   [name] was instructed to call 911 with any severe reactions post vaccine: Marland Kitchen Difficulty breathing  . Swelling of face and throat  . A fast heartbeat  . A bad rash all over body  . Dizziness and weakness

## 2019-12-17 DIAGNOSIS — F064 Anxiety disorder due to known physiological condition: Secondary | ICD-10-CM | POA: Diagnosis not present

## 2019-12-29 DIAGNOSIS — M79674 Pain in right toe(s): Secondary | ICD-10-CM | POA: Diagnosis not present

## 2019-12-30 ENCOUNTER — Telehealth: Payer: Self-pay | Admitting: *Deleted

## 2019-12-30 NOTE — Telephone Encounter (Signed)
Ortho bundle 1 year call completed. ?

## 2020-01-15 DIAGNOSIS — Z23 Encounter for immunization: Secondary | ICD-10-CM | POA: Diagnosis not present

## 2020-01-30 ENCOUNTER — Ambulatory Visit (INDEPENDENT_AMBULATORY_CARE_PROVIDER_SITE_OTHER): Payer: Medicare Other | Admitting: Podiatry

## 2020-01-30 ENCOUNTER — Other Ambulatory Visit: Payer: Self-pay

## 2020-01-30 ENCOUNTER — Ambulatory Visit (INDEPENDENT_AMBULATORY_CARE_PROVIDER_SITE_OTHER): Payer: Medicare Other

## 2020-01-30 ENCOUNTER — Encounter: Payer: Self-pay | Admitting: Podiatry

## 2020-01-30 DIAGNOSIS — L84 Corns and callosities: Secondary | ICD-10-CM

## 2020-01-30 DIAGNOSIS — R200 Anesthesia of skin: Secondary | ICD-10-CM | POA: Diagnosis not present

## 2020-01-30 DIAGNOSIS — M2041 Other hammer toe(s) (acquired), right foot: Secondary | ICD-10-CM | POA: Diagnosis not present

## 2020-02-02 NOTE — Progress Notes (Signed)
Subjective:   Patient ID: Judy White, female   DOB: 79 y.o.   MRN: [id number]   HPI Patient presents stating she is doing well and states that she has some numbness in her feet and she is just concerned and wanted to have that checked.  Patient also gets periodic pain in her feet.  Patient does not smoke likes to be active   Review of Systems  All other systems reviewed and are negative.       Objective:  Physical Exam Vitals and nursing note reviewed.  Constitutional:      Appearance: She is well-developed.  Pulmonary:     Effort: Pulmonary effort is normal.  Musculoskeletal:        General: Normal range of motion.  Skin:    General: Skin is warm.  Neurological:     Mental Status: She is alert.     Neurovascular status found to be intact muscle strength found to be adequate with patient having mild diminishment sharp dull vibratory but intact.  I checked muscle strength of all muscles all range of motion and did not find deficiency with moderate flatfoot and mild discomfort plantar feet when palpated deeply.  Patient has good digital perfusion well oriented x3     Assessment:  Patient may have low-grade neuropathic-like symptomatology or we may be dealing strictly with low-grade tendinitis or other pathology      Plan:  H&P all conditions reviewed and recommended anti-inflammatories over-the-counter insoles soaks range of motion exercises.  If symptoms persist may consider other treatment  X-rays were negative for signs of advanced arthritis indications of stress fracture or other bone pathology

## 2020-02-27 ENCOUNTER — Other Ambulatory Visit: Payer: Self-pay | Admitting: Interventional Cardiology

## 2020-03-31 NOTE — Progress Notes (Signed)
Cardiology Office Note:    Date:  04/01/2020   ID:  JANEYA DEYO, DOB 1940/09/21, MRN [id number]  PCP:  Josetta Huddle, MD  Cardiologist:  Sinclair Grooms, MD   Referring MD: Josetta Huddle, MD   Chief Complaint  Patient presents with  . Atrial Fibrillation    History of Present Illness:    Judy White is a 80 y.o. female with a hx of paroxysmal atrial fibrillation with successful ablation, Watchman implantation 2016, amiodarone therapy, diastolic heart failure, unable to use anticoagulation due to intracranial hemorrhage, essential hypertension, sleep apnea with CPAP compliance, history of DVT with pulmonary emboli, and anxiety disorder.  No complaints about her personal health.  Challenged by her husbands decline., which is putting increased physical and emotional stress on her..  Denies orthopnea, PND, Edema, palpitations, and syncope.   Past Medical History:  Diagnosis Date  . Agoraphobia   . Anxiety   . Arthritis    "normal for the age; nothing gives me problems" (01/24/2016)  . COPD (chronic obstructive pulmonary disease) ([place])    "mild" (01/24/2016)  . Depressive disorder, not elsewhere classified   . DVT (deep venous thrombosis) ([place]) 10/08/2015   "they said the blood clot went from one of my legs to my lung"  . History of hiatal hernia    "small one" (01/24/2016)  . Hypercholesteremia   . Hypertension    pt denies this hx on 01/24/2016  . Myalgia and myositis, unspecified   . Osteopenia   . Overactive bladder   . Persistent atrial fibrillation ([place])    a. s/p Watchman implant 12/2014  . Presence of Watchman left atrial appendage closure device   . Pulmonary embolism ([place]) 10/08/2015  . Sleep apnea    USES CPAP     Past Surgical History:  Procedure Laterality Date  . APPENDECTOMY  1983  . ATRIAL FIBRILLATION ABLATION  06/28/2017   PER PATIENT SUCCESSFUL ABLATION , [facility] [place] , [place]  . BACK SURGERY    . BRONCHOSCOPY   01/24/2016  . CARDIAC CATHETERIZATION     >5 yrs  . CATARACT EXTRACTION W/ INTRAOCULAR LENS  IMPLANT, BILATERAL Bilateral   . CESAREAN SECTION  1967  . LEFT ATRIAL APPENDAGE OCCLUSION N/A 12/31/2014   Procedure: LEFT ATRIAL APPENDAGE OCCLUSION;  Surgeon: Thompson Grayer, MD;  Location: [facility];  Service: Cardiovascular;  Laterality: N/A;  . POSTERIOR FUSION LUMBAR SPINE Bilateral 07/2014  . SHOULDER ARTHROSCOPY W/ ROTATOR CUFF REPAIR Left   . SHOULDER SURGERY Right    "tendon broke; couldn't be repaired"  . TEE WITHOUT CARDIOVERSION N/A 12/22/2014   Procedure: TRANSESOPHAGEAL ECHOCARDIOGRAM (TEE);  Surgeon: Pixie Casino, MD;  Location: [facility] ENDOSCOPY;  Service: Cardiovascular;  Laterality: N/A;  . TEE WITHOUT CARDIOVERSION N/A 02/16/2015   post Watchman TEE with good seal and no leak around device   . TONSILLECTOMY    . TOOTH EXTRACTION  05/2018  . TOTAL ABDOMINAL HYSTERECTOMY    . TOTAL KNEE ARTHROPLASTY Right 12/27/2018   Procedure: RIGHT TOTAL KNEE ARTHROPLASTY;  Surgeon: Mcarthur Rossetti, MD;  [facility];  Service: Orthopedics;  Laterality: Right;  Marland Kitchen VIDEO BRONCHOSCOPY Bilateral 01/24/2016   Procedure: VIDEO BRONCHOSCOPY WITH FLUORO;  Surgeon: Collene Gobble, MD;  Location: [facility];  Service: Cardiopulmonary;  Laterality: Bilateral;  . WISDOM TOOTH EXTRACTION      Current Medications: Current Meds  Medication Sig  . acetaminophen (TYLENOL) 500 MG tablet Take 500 mg  by mouth every 6 (six) hours as needed for moderate pain or headache.  Marland Kitchen aspirin EC 81 MG tablet Take 81 mg by mouth daily.  . Benfotiamine 150 MG CAPS Take 150 mg by mouth daily.  . Calcium-Vitamin D (CALTRATE 600 PLUS-VIT D PO) Take 1 tablet by mouth daily.   . Cholecalciferol (VITAMIN D3) 2000 units TABS Take 2,000 Units by mouth daily.  . clonazePAM (KLONOPIN) 1 MG tablet Take 1.5-2 mg by mouth daily.   Marland Kitchen FLUoxetine (PROZAC) 20 MG capsule Take 20 mg by mouth daily.   Marland Kitchen gabapentin  (NEURONTIN) 100 MG capsule Take 100 mg by mouth 3 (three) times daily.  . metoprolol succinate (TOPROL-XL) 25 MG 24 hr tablet Take 12.5 mg by mouth every morning.  Marland Kitchen oxybutynin (DITROPAN-XL) 10 MG 24 hr tablet Take 10 mg by mouth daily.   . Potassium 99 MG TABS Take 99 mg by mouth daily.  . vitamin C (ASCORBIC ACID) 500 MG tablet Take 500 mg by mouth daily.  . vitamin E 180 MG (400 UNITS) capsule Take 400 Units daily by mouth.  . [DISCONTINUED] furosemide (LASIX) 20 MG tablet Take 1 tablet (20 mg total) by mouth daily. Please keep upcoming appt in February 2022 with Dr. Tamala Julian for future refills. Thank you     Allergies:   Bupropion, Rosuvastatin, and Statins   Social History   Socioeconomic History  . Marital status: Married    Spouse name: Not on file  . Number of children: Not on file  . Years of education: Not on file  . Highest education level: Not on file  Occupational History  . Not on file  Tobacco Use  . Smoking status: Former Smoker    Packs/day: 0.50    Years: 60.00    Pack years: 30.00    Types: Cigarettes    Quit date: 10/08/2015    Years since quitting: 4.4  . Smokeless tobacco: Never Used  Vaping Use  . Vaping Use: Never used  Substance and Sexual Activity  . Alcohol use: Yes    Alcohol/week: 0.0 standard drinks    Comment: occ  . Drug use: No  . Sexual activity: Not on file  Other Topics Concern  . Not on file  Social History Narrative  . Not on file   Social Determinants of Health   Financial Resource Strain: Not on file  Food Insecurity: Not on file  Transportation Needs: Not on file  Physical Activity: Not on file  Stress: Not on file  Social Connections: Not on file     Family History: The patient's family history includes Aneurysm in her father; Cancer in an other family member; Heart Problems in an other family member; Heart disease in her brother; Hypotension in her mother; Neuropathy in her brother. There is no history of Heart  attack.  ROS:   Please see the history of present illness.    Okay and without complaints. All other systems reviewed and are negative.  EKGs/Labs/Other Studies Reviewed:    The following studies were reviewed today: No new data  EKG:  EKG SB with otherwise normal appearrance.  Recent Labs: No results found for requested labs within last 8760 hours.  Recent Lipid Panel    Component Value Date/Time   CHOL 185 12/28/2017 0927   TRIG 218 (H) 12/28/2017 0927   HDL 41 12/28/2017 0927   CHOLHDL 4.5 (H) 12/28/2017 0927   LDLCALC 100 (H) 12/28/2017 0927    Physical Exam:  VS:  BP 114/68   Pulse (!) 54   Ht 5\' 6"  (1.676 m)   Wt 189 lb 6.4 oz (85.9 kg)   SpO2 95%   BMI 30.57 kg/m     Wt Readings from Last 3 Encounters:  04/01/20 189 lb 6.4 oz (85.9 kg)  05/05/19 184 lb 12.8 oz (83.8 kg)  03/06/19 185 lb 12.8 oz (84.3 kg)     GEN: [place]. No acute distress HEENT: Normal NECK: No JVD. LYMPHATICS: No lymphadenopathy CARDIAC: No  murmur. RRR No gallop, or edema. VASCULAR:  Normal Pulses. No bruits. RESPIRATORY:  Clear to auscultation without rales, wheezing or rhonchi  ABDOMEN: Soft, non-tender, non-distended, No pulsatile mass, MUSCULOSKELETAL: No deformity  SKIN: Warm and dry NEUROLOGIC:  Alert and oriented x 3 PSYCHIATRIC:  Normal affect   ASSESSMENT:    1. Paroxysmal atrial fibrillation (HCC)   2. Chronic diastolic heart failure ([place])   3. Essential hypertension   4. Long-term current use of high risk medication other than anticoagulant   5. Hypercholesteremia   6. Educated about COVID-19 virus infection    PLAN:    In order of problems listed above:  1. Since ablation, no episodes of atrial fibrillation.  She is unable to use anticoagulation therapy, has had ablation, and has a watchman device implanted.  Continue Toprol-XL 12.5 mg/day. 2. Continue low-dose beta-blocker and low-dose furosemide 3. Excellent blood pressure control on current  therapy. 4. Unable to use anticoagulant therapy because of intracranial hemorrhage in the past. 5. Lipids and not being treated because of intolerance. 6. Vaccinated and practicing social distancing.   Medication Adjustments/Labs and Tests Ordered: Current medicines are reviewed at length with the patient today.  Concerns regarding medicines are outlined above.  Orders Placed This Encounter  Procedures  . EKG 12-Lead   Meds ordered this encounter  Medications  . furosemide (LASIX) 20 MG tablet    Sig: Take 1 tablet (20 mg total) by mouth daily.    Dispense:  90 tablet    Refill:  3    Pt must keep upcoming appt in February 2022 with Dr. Tamala Julian before anymore refills. Thank you    Patient Instructions  Medication Instructions:  Your physician recommends that you continue on your current medications as directed. Please refer to the Current Medication list given to you today.  *If you need a refill on your cardiac medications before your next appointment, please call your pharmacy*   Lab Work: None If you have labs (blood work) drawn today and your tests are completely normal, you will receive your results only by: Marland Kitchen MyChart Message (if you have MyChart) OR . A paper copy in the mail If you have any lab test that is abnormal or we need to change your treatment, we will call you to review the results.   Testing/Procedures: None   Follow-Up: At [facility], you and your health needs are our priority.  As part of our continuing mission to provide you with exceptional heart care, we have created designated Provider Care Teams.  These Care Teams include your primary Cardiologist (physician) and Advanced Practice Providers (APPs -  Physician Assistants and Nurse Practitioners) who all work together to provide you with the care you need, when you need it.  We recommend signing up for the patient portal called "MyChart".  Sign up information is provided on this After Visit Summary.   MyChart is used to connect with patients for Virtual Visits (Telemedicine).  Patients are able to  view lab/test results, encounter notes, upcoming appointments, etc.  Non-urgent messages can be sent to your provider as well.   To learn more about what you can do with MyChart, go to [email or web address].    Your next appointment:   9 month(s)  The format for your next appointment:   In Person  Provider:   You may see Sinclair Grooms, MD or one of the following Advanced Practice Providers on your designated Care Team:    Kathyrn Drown, NP    Other Instructions      Signed, Sinclair Grooms, MD  04/01/2020 10:01 AM    [place]

## 2020-04-01 ENCOUNTER — Other Ambulatory Visit: Payer: Self-pay

## 2020-04-01 ENCOUNTER — Encounter: Payer: Self-pay | Admitting: Interventional Cardiology

## 2020-04-01 ENCOUNTER — Ambulatory Visit (INDEPENDENT_AMBULATORY_CARE_PROVIDER_SITE_OTHER): Payer: Medicare Other | Admitting: Interventional Cardiology

## 2020-04-01 VITALS — BP 114/68 | HR 54 | Ht 66.0 in | Wt 189.4 lb

## 2020-04-01 DIAGNOSIS — E7800 Pure hypercholesterolemia, unspecified: Secondary | ICD-10-CM

## 2020-04-01 DIAGNOSIS — Z7189 Other specified counseling: Secondary | ICD-10-CM | POA: Diagnosis not present

## 2020-04-01 DIAGNOSIS — Z79899 Other long term (current) drug therapy: Secondary | ICD-10-CM

## 2020-04-01 DIAGNOSIS — I10 Essential (primary) hypertension: Secondary | ICD-10-CM

## 2020-04-01 DIAGNOSIS — I480 Paroxysmal atrial fibrillation: Secondary | ICD-10-CM

## 2020-04-01 DIAGNOSIS — I5032 Chronic diastolic (congestive) heart failure: Secondary | ICD-10-CM

## 2020-04-01 MED ORDER — FUROSEMIDE 20 MG PO TABS
20.0000 mg | ORAL_TABLET | Freq: Every day | ORAL | 3 refills | Status: DC
Start: 2020-04-01 — End: 2021-05-30

## 2020-04-01 NOTE — Patient Instructions (Signed)
Medication Instructions:  Your physician recommends that you continue on your current medications as directed. Please refer to the Current Medication list given to you today.  *If you need a refill on your cardiac medications before your next appointment, please call your pharmacy*   Lab Work: None If you have labs (blood work) drawn today and your tests are completely normal, you will receive your results only by: . MyChart Message (if you have MyChart) OR . A paper copy in the mail If you have any lab test that is abnormal or we need to change your treatment, we will call you to review the results.   Testing/Procedures: None   Follow-Up: At [facility], you and your health needs are our priority.  As part of our continuing mission to provide you with exceptional heart care, we have created designated Provider Care Teams.  These Care Teams include your primary Cardiologist (physician) and Advanced Practice Providers (APPs -  Physician Assistants and Nurse Practitioners) who all work together to provide you with the care you need, when you need it.  We recommend signing up for the patient portal called "MyChart".  Sign up information is provided on this After Visit Summary.  MyChart is used to connect with patients for Virtual Visits (Telemedicine).  Patients are able to view lab/test results, encounter notes, upcoming appointments, etc.  Non-urgent messages can be sent to your provider as well.   To learn more about what you can do with MyChart, go to [email or web address].    Your next appointment:   9 month(s)  The format for your next appointment:   In Person  Provider:   You may see Henry W Smith III, MD or one of the following Advanced Practice Providers on your designated Care Team:    Jill McDaniel, NP    Other Instructions   

## 2020-07-07 DIAGNOSIS — F064 Anxiety disorder due to known physiological condition: Secondary | ICD-10-CM | POA: Diagnosis not present

## 2020-08-10 DIAGNOSIS — Z1231 Encounter for screening mammogram for malignant neoplasm of breast: Secondary | ICD-10-CM | POA: Diagnosis not present

## 2020-09-13 ENCOUNTER — Ambulatory Visit (INDEPENDENT_AMBULATORY_CARE_PROVIDER_SITE_OTHER): Payer: Medicare Other

## 2020-09-13 ENCOUNTER — Ambulatory Visit (INDEPENDENT_AMBULATORY_CARE_PROVIDER_SITE_OTHER): Payer: Medicare Other | Admitting: Physician Assistant

## 2020-09-13 ENCOUNTER — Encounter: Payer: Self-pay | Admitting: Physician Assistant

## 2020-09-13 DIAGNOSIS — Z96651 Presence of right artificial knee joint: Secondary | ICD-10-CM | POA: Diagnosis not present

## 2020-09-13 NOTE — Progress Notes (Signed)
HPI: Judy White comes in today due to right knee pain and swelling.  She is status post right total knee arthroplasty 12/27/2018.  She states that she had pain in the knee since last Friday.  She notes that the knee began catching and getting stuck in 1 position.  She noticed a knot on the anterior aspect of the knee.  She denies any injury.  She states it feels as if something is grabbing inside the knee.  She is taken Tylenol arthritis for the pain which helps some.  She states overall she is doing better today.  Review of systems: Negative for fevers, chills, or injury.  Please see HPI otherwise negative or noncontributory.  Physical exam: General: Well-developed well-nourished female no acute distress mood and affect appropriate. Psych: Alert and oriented x3 Right knee: No abnormal warmth erythema or effusion surgical incisions well-healed.  Tenderness over the anterior medial joint line.  She has full extension full flexion of the knee.  No instability valgus varus stressing.  Right calf supple nontender.   Radiographs: Right knee 2 views: No acute fracture.  Well-seated right total knee arthroplasty components.  There is any of opaque density seen on the lateral view just under the femoral component and anteriorly situated.  This lucency has a very distinct straight edge more consistent with a foreign body that heterotopic bone.  On the AP view the the opaque density can be seen in the region of the femoral notch.  No other bony.  Impression: Right knee acute pain History of right total knee arthroplasty 12/27/2018  Plan:Given patient's acute onset and the radiopaque density we will obtain an MRI to rule out loose body/foreign body as a source of her pain.  She will follow-up after the MRI to go over results and discuss further treatment.  She may benefit from knee arthroscopy versus possible knee arthrotomy with polyexchange.  Questions were encouraged and answered.

## 2020-09-13 NOTE — Addendum Note (Signed)
Addended by: Robyne Peers on: 09/13/2020 12:52 PM   Modules accepted: Orders

## 2020-09-23 ENCOUNTER — Other Ambulatory Visit: Payer: Self-pay

## 2020-09-23 ENCOUNTER — Ambulatory Visit
Admission: RE | Admit: 2020-09-23 | Discharge: 2020-09-23 | Disposition: A | Discharge status: Home / Self Care | Payer: Medicare Other | Source: Ambulatory Visit | Attending: Physician Assistant | Admitting: Physician Assistant

## 2020-09-23 DIAGNOSIS — M25561 Pain in right knee: Secondary | ICD-10-CM | POA: Diagnosis not present

## 2020-09-23 DIAGNOSIS — Z96651 Presence of right artificial knee joint: Secondary | ICD-10-CM

## 2020-09-24 ENCOUNTER — Telehealth: Payer: Self-pay | Admitting: Orthopaedic Surgery

## 2020-09-24 NOTE — Telephone Encounter (Signed)
Pt portal message already sent to dr. Ninfa Linden

## 2020-09-24 NOTE — Telephone Encounter (Signed)
Patient called to schedule an appointment with Dr. Ninfa Linden for a MRI review. Next open time is 8/16. She says that is too long to wait. Would like to be seen next week. Her call back number is [phone]

## 2020-09-29 ENCOUNTER — Ambulatory Visit (INDEPENDENT_AMBULATORY_CARE_PROVIDER_SITE_OTHER): Payer: Medicare Other | Admitting: Physician Assistant

## 2020-09-29 ENCOUNTER — Other Ambulatory Visit: Payer: Self-pay

## 2020-09-29 ENCOUNTER — Ambulatory Visit (INDEPENDENT_AMBULATORY_CARE_PROVIDER_SITE_OTHER): Payer: Medicare Other

## 2020-09-29 DIAGNOSIS — M545 Low back pain, unspecified: Secondary | ICD-10-CM

## 2020-09-29 DIAGNOSIS — M7651 Patellar tendinitis, right knee: Secondary | ICD-10-CM

## 2020-09-29 DIAGNOSIS — M7061 Trochanteric bursitis, right hip: Secondary | ICD-10-CM | POA: Diagnosis not present

## 2020-09-29 MED ORDER — METHYLPREDNISOLONE ACETATE 40 MG/ML IJ SUSP
40.0000 mg | INTRAMUSCULAR | Status: AC | PRN
  Administered 2020-09-29: 40 mg via INTRA_ARTICULAR

## 2020-09-29 MED ORDER — LIDOCAINE HCL 1 % IJ SOLN
3.0000 mL | INTRAMUSCULAR | Status: AC | PRN
  Administered 2020-09-29: 3 mL

## 2020-09-29 NOTE — Progress Notes (Signed)
Office Visit Note   Patient: Judy White           Date of Birth: [birth year]           MRN: [id number] Visit Date: 09/29/2020              Requested by: Josetta Huddle, MD [address] [place],  [place] [address] PCP: Josetta Huddle, MD   Assessment & Plan: Visit Diagnoses:  1. Low back pain, unspecified back pain laterality, unspecified chronicity, unspecified whether sciatica present   2. Patellar tendinitis of right knee   3. Trochanteric bursitis, right hip     Plan: Recommend patient discuss with her primary care her urinary incontinence she may need referral back to urology.  In regards to her right knee patella tendinitis, right hip trochanteric bursitis and lumbar radiculopathy try conservative measures.  Recommend trochanteric injection of the right hip today patient was agreeable.  We will perform this without incident.  We will send her to formal physical therapy for her back to work on range of motion core strengthening.  They will also work on IT band stretching and hamstring stretching.  Regards to the right knee work on overall range of motion strengthening and modalities.  She will get a home exercise program from physical therapy.  She will follow-up with [place] in 6 weeks to see how she is doing overall.  Questions were encouraged and answered at length today.  Follow-Up Instructions: Return in about 6 weeks (around 11/10/2020).   Orders:  Orders Placed This Encounter  Procedures   Large Joint Inj: R greater trochanter   XR Lumbar Spine 2-3 Views   No orders of the defined types were placed in this encounter.     Procedures: Large Joint Inj: R greater trochanter on 09/29/2020 10:28 AM Indications: pain Details: 22 G 1.5 in needle, lateral approach  Arthrogram: No  Medications: 3 mL lidocaine 1 %; 40 mg methylPREDNISolone acetate 40 MG/ML Outcome: tolerated well, no immediate complications Procedure, treatment alternatives, risks and benefits explained,  specific risks discussed. Consent was given by the patient. Immediately prior to procedure a time out was called to verify the correct patient, procedure, equipment, support staff and site/side marked as required. Patient was prepped and draped in the usual sterile fashion.      Clinical Data: No additional findings.   Subjective: Chief Complaint  Patient presents with   Right Knee - Follow-up    HPI Judy White returns today to go over the MRI of her right knee.  She continues to have pain in the right knee.  He also is having some low back pain and some radicular pain down the right leg.  She has a history of lumbar fusion L4-5 by Dr. Ellene Route.  Said no new injury to the back.  She states most of her pain is the lateral aspect of her right hip.  She has no saddle anesthesia like symptoms.  Does note that the pain in her back and leg awakens her at night.  She has some bladder incontinence and does not feel like.  Oxybutynin is working as well as it has in the past. MRI is reviewed with the patient.  Images reviewed.  MRI is somewhat limited due to the arthroplasty hardware.  Small effusion of the knee with synovial thickening.  Thickening of patella tendon.  No evidence of fracture or hardware failure or other complications.  Review of Systems Negative for fevers chills.  Please  see HPI otherwise negative  Objective: Vital Signs: There were no vitals taken for this visit.  Physical Exam General: Well-developed well-nourished female no acute distress mood and affect appropriate. Psych: Alert and oriented x3  Ortho Exam Lower extremities 5 5 strength throughout lower extremities against resistance.  Negative straight leg raise bilaterally.  Good range of motion of both hips without pain.  Tenderness over the right hip trochanteric region.  Right knee no abnormal warmth erythema or effusion.  Tenderness over the patella tibial tendon.  He has also tenderness along medial lateral joint line of  the right knee.  Good range of motion of the right knee.  Well-healed surgical incisions from total knee arthroplasty.  Tenderness over the patella tibial tendon but able to do straight leg raise on the right.  Specialty Comments:  No specialty comments available.  Imaging: XR Lumbar Spine 2-3 Views  Result Date: 09/29/2020 Lumbar spine 2 views: No acute fractures.  Status post L4-5 fusion with no hardware failure.  Significant endplate spurring at X33443 and L3-4.  Loss of disc space L3 -4.  No spondylolisthesis.  Decreased bone density throughout the lumbar spine.  Arthrosclerosis over the aorta noted.    PMFS History: Patient Active Problem List   Diagnosis Date Noted   Emesis, persistent 05/05/2019   Status post total right knee replacement 12/27/2018   Unilateral primary osteoarthritis, right knee 11/27/2018   Rib fracture 04/22/2018   Pneumothorax on left 01/24/2016   Abnormal CT of the chest    Pulmonary embolism (HCC)    Interstitial lung disease ([place]) 10/08/2015   Paroxysmal atrial fibrillation (HCC)    Typical atrial flutter (HCC)    Chronic diastolic heart failure ([place]) 08/25/2014   Lumbar stenosis 08/11/2014   On amiodarone therapy 05/21/2013   Hypercholesteremia    Essential hypertension    Myalgia and myositis, unspecified    COPD GOLD 0     Overactive bladder    Past Medical History:  Diagnosis Date   Agoraphobia    Anxiety    Arthritis    "normal for the age; nothing gives me problems" (01/24/2016)   COPD (chronic obstructive pulmonary disease) ([place])    "mild" (01/24/2016)   Depressive disorder, not elsewhere classified    DVT (deep venous thrombosis) ([place]) 10/08/2015   "they said the blood clot went from one of my legs to my lung"   History of hiatal hernia    "small one" (01/24/2016)   Hypercholesteremia    Hypertension    pt denies this hx on 01/24/2016   Myalgia and myositis, unspecified    Osteopenia    Overactive bladder    Persistent atrial  fibrillation ([place])    a. s/p Watchman implant 12/2014   Presence of Watchman left atrial appendage closure device    Pulmonary embolism ([place]) 10/08/2015   Sleep apnea    USES CPAP     Family History  Problem Relation Age of Onset   Aneurysm Father    Hypotension Mother    Neuropathy Brother    Heart disease Brother    Cancer Other        maternal side   Heart Problems Other        paternal side   Heart attack Neg Hx     Past Surgical History:  Procedure Laterality Date   APPENDECTOMY  1983   ATRIAL FIBRILLATION ABLATION  06/28/2017   PER PATIENT SUCCESSFUL ABLATION , [place] , [place] 2020  BACK SURGERY     BRONCHOSCOPY  01/24/2016   CARDIAC CATHETERIZATION     >5 yrs   CATARACT EXTRACTION W/ INTRAOCULAR LENS  IMPLANT, BILATERAL Bilateral    CESAREAN SECTION  1967   LEFT ATRIAL APPENDAGE OCCLUSION N/A 12/31/2014   Procedure: LEFT ATRIAL APPENDAGE OCCLUSION;  Surgeon: Thompson Grayer, MD;  Location: [facility];  Service: Cardiovascular;  Laterality: N/A;   POSTERIOR FUSION LUMBAR SPINE Bilateral 07/2014   SHOULDER ARTHROSCOPY W/ ROTATOR CUFF REPAIR Left    SHOULDER SURGERY Right    "tendon broke; couldn't be repaired"   TEE WITHOUT CARDIOVERSION N/A 12/22/2014   Procedure: TRANSESOPHAGEAL ECHOCARDIOGRAM (TEE);  Surgeon: Pixie Casino, MD;  Location: [facility] ENDOSCOPY;  Service: Cardiovascular;  Laterality: N/A;   TEE WITHOUT CARDIOVERSION N/A 02/16/2015   post Watchman TEE with good seal and no leak around device    TONSILLECTOMY     TOOTH EXTRACTION  05/2018   TOTAL ABDOMINAL HYSTERECTOMY     TOTAL KNEE ARTHROPLASTY Right 12/27/2018   Procedure: RIGHT TOTAL KNEE ARTHROPLASTY;  Surgeon: Mcarthur Rossetti, MD;  Location[facility];  Service: Orthopedics;  Laterality: Right;   VIDEO BRONCHOSCOPY Bilateral 01/24/2016   Procedure: VIDEO BRONCHOSCOPY WITH FLUORO;  Surgeon: Collene Gobble, MD;  Location: [facility];  Service: Cardiopulmonary;   Laterality: Bilateral;   WISDOM TOOTH EXTRACTION     Social History   Occupational History   Not on file  Tobacco Use   Smoking status: Former    Packs/day: 0.50    Years: 60.00    Pack years: 30.00    Types: Cigarettes    Quit date: 10/08/2015    Years since quitting: 4.9   Smokeless tobacco: Never  Vaping Use   Vaping Use: Never used  Substance and Sexual Activity   Alcohol use: Yes    Alcohol/week: 0.0 standard drinks    Comment: occ   Drug use: No   Sexual activity: Not on file

## 2020-09-30 NOTE — Addendum Note (Signed)
Addended by: Robyne Peers on: 09/30/2020 08:34 AM   Modules accepted: Orders

## 2020-10-05 DIAGNOSIS — F064 Anxiety disorder due to known physiological condition: Secondary | ICD-10-CM | POA: Diagnosis not present

## 2020-10-19 ENCOUNTER — Ambulatory Visit (INDEPENDENT_AMBULATORY_CARE_PROVIDER_SITE_OTHER): Payer: Medicare Other | Admitting: Physical Therapy

## 2020-10-19 ENCOUNTER — Encounter: Payer: Self-pay | Admitting: Physical Therapy

## 2020-10-19 ENCOUNTER — Other Ambulatory Visit: Payer: Self-pay

## 2020-10-19 DIAGNOSIS — R2681 Unsteadiness on feet: Secondary | ICD-10-CM

## 2020-10-19 DIAGNOSIS — R269 Unspecified abnormalities of gait and mobility: Secondary | ICD-10-CM | POA: Diagnosis not present

## 2020-10-19 DIAGNOSIS — M25561 Pain in right knee: Secondary | ICD-10-CM | POA: Diagnosis not present

## 2020-10-19 DIAGNOSIS — M6281 Muscle weakness (generalized): Secondary | ICD-10-CM

## 2020-10-19 NOTE — Therapy (Signed)
[facility] [address] [place], [place], [address] Phone: [phone]   Fax:  [phone]  Physical Therapy Evaluation  Patient Details  Name: Judy White MRN: [id number] Date of Birth: 10/14/1940 Referring Provider (PT): Pete Pelt, [place]   Encounter Date: 10/19/2020   PT End of Session - 10/19/20 1409     Visit Number 1    Number of Visits 6    Date for PT Re-Evaluation 11/30/20    Authorization Type Medicare/BCBS    Progress Note Due on Visit 10    PT Start Time 1100    PT Stop Time 1140    PT Time Calculation (min) 40 min    Activity Tolerance Patient tolerated treatment well    Behavior During Therapy [facility] for tasks assessed/performed             Past Medical History:  Diagnosis Date   Agoraphobia    Anxiety    Arthritis    "normal for the age; nothing gives me problems" (01/24/2016)   COPD (chronic obstructive pulmonary disease) ([place])    "mild" (01/24/2016)   Depressive disorder, not elsewhere classified    DVT (deep venous thrombosis) ([place]) 10/08/2015   "they said the blood clot went from one of my legs to my lung"   History of hiatal hernia    "small one" (01/24/2016)   Hypercholesteremia    Hypertension    pt denies this hx on 01/24/2016   Myalgia and myositis, unspecified    Osteopenia    Overactive bladder    Persistent atrial fibrillation ([place])    a. s/p Watchman implant 12/2014   Presence of Watchman left atrial appendage closure device    Pulmonary embolism ([place]) 10/08/2015   Sleep apnea    USES CPAP     Past Surgical History:  Procedure Laterality Date   APPENDECTOMY  1983   ATRIAL FIBRILLATION ABLATION  06/28/2017   PER PATIENT SUCCESSFUL ABLATION , [place] AND [place] , [place]   BACK SURGERY     BRONCHOSCOPY  01/24/2016   CARDIAC CATHETERIZATION     >5 yrs   CATARACT EXTRACTION W/ INTRAOCULAR LENS  IMPLANT, BILATERAL Bilateral    [place] N/A 12/31/2014   Procedure: LEFT ATRIAL APPENDAGE OCCLUSION;  Surgeon: Thompson Grayer, MD;  Location: [place];  Service: Cardiovascular;  Laterality: N/A;   POSTERIOR FUSION LUMBAR SPINE Bilateral 07/2014   SHOULDER ARTHROSCOPY W/ ROTATOR CUFF REPAIR Left    SHOULDER SURGERY Right    "tendon broke; couldn't be repaired"   TEE WITHOUT CARDIOVERSION N/A 12/22/2014   Procedure: TRANSESOPHAGEAL ECHOCARDIOGRAM (TEE);  Surgeon: Pixie Casino, MD;  Location: [facility] ENDOSCOPY;  Service: Cardiovascular;  Laterality: N/A;   TEE WITHOUT CARDIOVERSION N/A 02/16/2015   post Watchman TEE with good seal and no leak around device    TONSILLECTOMY     TOOTH EXTRACTION  05/2018   TOTAL ABDOMINAL HYSTERECTOMY     TOTAL KNEE ARTHROPLASTY Right 12/27/2018   Procedure: RIGHT TOTAL KNEE ARTHROPLASTY;  Surgeon: Mcarthur Rossetti, MD;  Location: [facility];  Service: Orthopedics;  Laterality: Right;   VIDEO BRONCHOSCOPY Bilateral 01/24/2016   Procedure: VIDEO BRONCHOSCOPY WITH FLUORO;  Surgeon: Collene Gobble, MD;  Location: [facility];  Service: Cardiopulmonary;  Laterality: Bilateral;   WISDOM TOOTH EXTRACTION      There were no vitals filed for this visit.    Subjective Assessment - 10/19/20  85     Subjective Pt is an 80 y/o female who presents to [place] with Rt sided LBP and hip pain which is chronic for at least 6 months, maybe longer.  She also has some pain in her Rt knee where it locks up, which PA feels is a piece of loose cement in the knee.    Limitations Standing;Walking;Sitting    How long can you sit comfortably? "forever"    Patient Stated Goals improve pain    Currently in Pain? Yes    Pain Score 0-No pain   up to 10/10   Pain Location Back    Pain Orientation Right    Pain Descriptors / Indicators Sharp    Pain Type Chronic pain    Pain Onset More than a month ago    Pain Frequency Intermittent    Aggravating Factors  movement, twisting    Pain Relieving  Factors avoiding sudden movements                [facility] PT Assessment - 10/19/20 1100       Assessment   Medical Diagnosis M54.50 (ICD-10-CM) - Low back pain, unspecified back pain laterality, unspecified chronicity, unspecified whether sciatica present  M76.51 (ICD-10-CM) - Patellar tendinitis of right knee  M70.61 (ICD-10-CM) - Trochanteric bursitis, right hip    Referring Provider (PT) Pete Pelt, PA-C    Onset Date/Surgical Date --   chronic x 6-12 months   Hand Dominance Right    Next MD Visit 11/10/20    Prior Therapy following TKA      Precautions   Precautions Fall      Restrictions   Weight Bearing Restrictions No      Balance Screen   Has the patient fallen in the past 6 months No    Has the patient had a decrease in activity level because of a fear of falling?  No    Is the patient reluctant to leave their home because of a fear of falling?  No      Home Environment   Living Environment Private residence    Living Arrangements Alone    Type of [place] to enter    Entrance Stairs-Number of Steps 1    [place] One level    Additional Comments husband passed away in Jun 30, 2020, has chair lift up to attic      Prior Function   Level of [place] Retired    Leisure goes out to dinner; no regular exercise      Cognition   Overall Cognitive Status Within Functional Limits for tasks assessed      Observation/Other Assessments   Focus on Therapeutic Outcomes (FOTO)  42 (predicted 54)      Posture/Postural Control   Posture/Postural Control Postural limitations    Postural Limitations Rounded Shoulders;Forward head      ROM / Strength   AROM / PROM / Strength AROM;Strength      AROM   AROM Assessment Site Lumbar    Lumbar Flexion limited 25%    Lumbar Extension WNL without pain    Lumbar - Right Side Bend WNL    Lumbar - Left Side Bend WNL    Lumbar - Right Rotation WNL    Lumbar - Left  Rotation WNL with pain      Strength   Strength Assessment Site Hip;Knee;Ankle    Right/Left Hip Right;Left  Right Hip Flexion 4/5    Right Hip Extension 3/5    Right Hip ABduction 3/5    Left Hip Flexion 4/5    Left Hip Extension 3/5    Left Hip ABduction 3/5    Right/Left Knee Left;Right    Right Knee Flexion 5/5    Right Knee Extension 5/5    Left Knee Flexion 5/5    Left Knee Extension 5/5    Right/Left Ankle Left;Right    Right Ankle Dorsiflexion 4/5    Left Ankle Dorsiflexion 4/5      Palpation   Palpation comment trigger points noted Rt QL and glute med      Special Tests    Special Tests Lumbar    Lumbar Tests Slump Test      Slump test   Findings Negative                        Objective measurements completed on examination: See above findings.       [facility] Adult PT Treatment/Exercise - 10/19/20 1100       Exercises   Exercises Other Exercises    Other Exercises  see pt instructions - performed 1-2 reps of each exercise with instruction      Manual Therapy   Manual Therapy Soft tissue mobilization    Manual therapy comments skilled palpation and monitoring of soft tissue during DN    Soft tissue mobilization STM with compression to Rt glute med              Trigger Point Dry Needling - 10/19/20 1407     Consent Given? Yes    Education Handout Provided Yes    Muscles Treated Back/Hip Gluteus medius    Gluteus Medius Response Twitch response elicited                  PT Education - 10/19/20 1126     Education Details HEP, DN    Person(s) Educated Patient    Methods Explanation;Demonstration;Handout    Comprehension Verbalized understanding;Returned demonstration;Need further instruction              PT Short Term Goals - 10/19/20 1412       PT SHORT TERM GOAL #1   Title Pt demonstrates full independence with initial HEP.    Baseline n/a    Time 3    Period Weeks    Status New    Target Date 11/09/20                PT Long Term Goals - 10/19/20 1413       PT LONG TERM GOAL #1   Title Pt is fully independent with advanced HEP.    Time 6    Period Weeks    Status New    Target Date 11/30/20      PT LONG TERM GOAL #2   Title FOTO score improved to 54 for improved function    Baseline -    Time 6    Period Weeks    Status New    Target Date 11/30/20      PT LONG TERM GOAL #3   Title Pt demonstrates ability to perform steps independently for functional stair ambulation to get into home.    Time 6    Period Weeks    Status New    Target Date 11/30/20      PT LONG TERM GOAL #4   Title report  pain < 3/10 with activity for improved function and mobility    Baseline -    Time 6    Period Weeks    Status New    Target Date 11/30/20                    Plan - 10/19/20 1409     Clinical Impression Statement Pt is an 80 y/o female who presents to [facility] for chronic Rt side LBP and hip pain.  She demonstrates decreased strength and active trigger points, as well as postural abnormalities and decreased flexibility affecting functional mobility.  Pt will benefit from PT to address deficits listed.    Personal Factors and Comorbidities Comorbidity 3+;Past/Current Experience    Comorbidities Rt TKA, anxiety, arthritis, COPD, depression, Hx DVT/PE, HTN, a-fib, osteopenia, hx back surgery, bil shoulder surgery    Examination-Activity Limitations Locomotion Level;Transfers;Bed Mobility;Stairs;Stand;Lift;Dressing;Hygiene/Grooming    Examination-Participation Restrictions Cleaning;Meal Prep;Community Activity;Driving;Shop    Stability/Clinical Decision Making Evolving/Moderate complexity    Clinical Decision Making Moderate    Rehab Potential Good    PT Frequency 1x / week    PT Duration 6 weeks    PT Treatment/Interventions ADLs/Self Care Home Management;Cryotherapy;Electrical Stimulation;Traction;Moist Heat;Gait training;Stair training;Functional mobility  training;Therapeutic activities;Therapeutic exercise;Balance training;Patient/family education;Neuromuscular re-education;Manual techniques;Taping;Dry needling    PT Next Visit Plan review HEP, assess response to DN, core/hip strengthening, HS stretching    PT Home Exercise Plan Access Code: [email or web address]    Consulted and Agree with Plan of Care Patient             Patient will benefit from skilled therapeutic intervention in order to improve the following deficits and impairments:  Pain, Increased fascial restricitons, Increased muscle spasms, Decreased strength, Impaired flexibility, Decreased endurance, Decreased activity tolerance, Postural dysfunction  Visit Diagnosis: Muscle weakness (generalized) - Plan: PT plan of care cert/re-cert  Acute pain of right knee - Plan: PT plan of care cert/re-cert  Abnormality of gait - Plan: PT plan of care cert/re-cert  Unsteadiness on feet - Plan: PT plan of care cert/re-cert     Problem List Patient Active Problem List   Diagnosis Date Noted   Emesis, persistent 05/05/2019   Status post total right knee replacement 12/27/2018   Unilateral primary osteoarthritis, right knee 11/27/2018   Rib fracture 04/22/2018   Pneumothorax on left 01/24/2016   Abnormal CT of the chest    Pulmonary embolism ([place])    Interstitial lung disease ([place]) 10/08/2015   Paroxysmal atrial fibrillation (H[facility])    Typical atrial flutter (H[facility])    Chronic diastolic heart failure ([place]) 08/25/2014   Lumbar stenosis 08/11/2014   On amiodarone therapy 05/21/2013   Hypercholesteremia    Essential hypertension    Myalgia and myositis, unspecified    COPD GOLD 0     Overactive bladder       Laureen Abrahams, PT, DPT 10/19/20 2:18 PM     [facility] [address] [place], [place], [address] Phone: [phone]   Fax:  [phone]  Name: TANESSA MANYGOATS MRN: [id number] Date of Birth: [birth year]

## 2020-10-19 NOTE — Patient Instructions (Signed)
Access Code: [email or web address] URL: [email or web address] Date: 10/19/2020 Prepared by: Faustino Congress  Exercises Hooklying Single Knee to Chest - 2 x daily - 7 x weekly - 1 sets - 3 reps - 30 sec hold Supine Piriformis Stretch with Foot on Ground - 2 x daily - 7 x weekly - 1 sets - 3 reps - 30 sec hold Standing Lumbar Extension with Counter - 2 x daily - 7 x weekly - 1 sets - 10 reps - 10 sec hold  Patient Education Trigger Point Dry Needling

## 2020-10-20 DIAGNOSIS — M179 Osteoarthritis of knee, unspecified: Secondary | ICD-10-CM | POA: Diagnosis not present

## 2020-10-20 DIAGNOSIS — Z0000 Encounter for general adult medical examination without abnormal findings: Secondary | ICD-10-CM | POA: Diagnosis not present

## 2020-10-20 DIAGNOSIS — E782 Mixed hyperlipidemia: Secondary | ICD-10-CM | POA: Diagnosis not present

## 2020-10-20 DIAGNOSIS — I480 Paroxysmal atrial fibrillation: Secondary | ICD-10-CM | POA: Diagnosis not present

## 2020-10-20 DIAGNOSIS — E559 Vitamin D deficiency, unspecified: Secondary | ICD-10-CM | POA: Diagnosis not present

## 2020-10-20 DIAGNOSIS — G4733 Obstructive sleep apnea (adult) (pediatric): Secondary | ICD-10-CM | POA: Diagnosis not present

## 2020-10-20 DIAGNOSIS — F329 Major depressive disorder, single episode, unspecified: Secondary | ICD-10-CM | POA: Diagnosis not present

## 2020-10-20 DIAGNOSIS — Z7189 Other specified counseling: Secondary | ICD-10-CM | POA: Diagnosis not present

## 2020-10-20 DIAGNOSIS — G629 Polyneuropathy, unspecified: Secondary | ICD-10-CM | POA: Diagnosis not present

## 2020-10-20 DIAGNOSIS — Z79899 Other long term (current) drug therapy: Secondary | ICD-10-CM | POA: Diagnosis not present

## 2020-10-26 ENCOUNTER — Encounter: Payer: Self-pay | Admitting: Physical Therapy

## 2020-10-26 ENCOUNTER — Other Ambulatory Visit: Payer: Self-pay

## 2020-10-26 ENCOUNTER — Ambulatory Visit (INDEPENDENT_AMBULATORY_CARE_PROVIDER_SITE_OTHER): Payer: Medicare Other | Admitting: Physical Therapy

## 2020-10-26 DIAGNOSIS — M6281 Muscle weakness (generalized): Secondary | ICD-10-CM | POA: Diagnosis not present

## 2020-10-26 DIAGNOSIS — R269 Unspecified abnormalities of gait and mobility: Secondary | ICD-10-CM

## 2020-10-26 DIAGNOSIS — R2681 Unsteadiness on feet: Secondary | ICD-10-CM | POA: Diagnosis not present

## 2020-10-26 DIAGNOSIS — M25561 Pain in right knee: Secondary | ICD-10-CM

## 2020-10-26 NOTE — Therapy (Signed)
[facility] [address] [place], [place], [address] Phone: [phone]   Fax:  [phone]  Physical Therapy Treatment  Patient Details  Name: Judy White MRN: [id number] Date of Birth: Jun 28, 1940 Referring Provider (PT): Pete Pelt, [place]   Encounter Date: 10/26/2020   PT End of Session - 10/26/20 1058     Visit Number 2    Number of Visits 6    Date for PT Re-Evaluation 11/30/20    Authorization Type Medicare/BCBS    Progress Note Due on Visit 10    PT Start Time 1015    PT Stop Time 1057    PT Time Calculation (min) 42 min    Activity Tolerance Patient tolerated treatment well    Behavior During Therapy [facility] for tasks assessed/performed             Past Medical History:  Diagnosis Date   Agoraphobia    Anxiety    Arthritis    "normal for the age; nothing gives me problems" (01/24/2016)   COPD (chronic obstructive pulmonary disease) ([place])    "mild" (01/24/2016)   Depressive disorder, not elsewhere classified    DVT (deep venous thrombosis) ([place]) 10/08/2015   "they said the blood clot went from one of my legs to my lung"   History of hiatal hernia    "small one" (01/24/2016)   Hypercholesteremia    Hypertension    pt denies this hx on 01/24/2016   Myalgia and myositis, unspecified    Osteopenia    Overactive bladder    Persistent atrial fibrillation ([place])    a. s/p Watchman implant 12/2014   Presence of Watchman left atrial appendage closure device    Pulmonary embolism ([place]) 10/08/2015   Sleep apnea    USES CPAP     Past Surgical History:  Procedure Laterality Date   APPENDECTOMY  1983   ATRIAL FIBRILLATION ABLATION  06/28/2017   PER PATIENT SUCCESSFUL ABLATION , [place] AND [place] , [place]   BACK SURGERY     BRONCHOSCOPY  01/24/2016   CARDIAC CATHETERIZATION     >5 yrs   CATARACT EXTRACTION W/ INTRAOCULAR LENS  IMPLANT, BILATERAL Bilateral    [place] N/A 12/31/2014   Procedure: LEFT ATRIAL APPENDAGE OCCLUSION;  Surgeon: Thompson Grayer, MD;  Location: [facility];  Service: Cardiovascular;  Laterality: N/A;   POSTERIOR FUSION LUMBAR SPINE Bilateral 07/2014   SHOULDER ARTHROSCOPY W/ ROTATOR CUFF REPAIR Left    SHOULDER SURGERY Right    "tendon broke; couldn't be repaired"   TEE WITHOUT CARDIOVERSION N/A 12/22/2014   Procedure: TRANSESOPHAGEAL ECHOCARDIOGRAM (TEE);  Surgeon: Pixie Casino, MD;  Location: [facility] ENDOSCOPY;  Service: Cardiovascular;  Laterality: N/A;   TEE WITHOUT CARDIOVERSION N/A 02/16/2015   post Watchman TEE with good seal and no leak around device    TONSILLECTOMY     TOOTH EXTRACTION  05/2018   TOTAL ABDOMINAL HYSTERECTOMY     TOTAL KNEE ARTHROPLASTY Right 12/27/2018   Procedure: RIGHT TOTAL KNEE ARTHROPLASTY;  Surgeon: Mcarthur Rossetti, MD;  [facility];  Service: Orthopedics;  Laterality: Right;   VIDEO BRONCHOSCOPY Bilateral 01/24/2016   Procedure: VIDEO BRONCHOSCOPY WITH FLUORO;  Surgeon: Collene Gobble, MD;  Location: [facility];  Service: Cardiopulmonary;  Laterality: Bilateral;   WISDOM TOOTH EXTRACTION      There were no vitals filed for this visit.   Subjective Assessment - 10/26/20 1018  Subjective "I about didn't come, I'm hurting so bad."    Limitations Standing;Walking;Sitting    How long can you sit comfortably? "forever"    Patient Stated Goals improve pain    Currently in Pain? Yes    Pain Score 3     Pain Location Back    Pain Orientation Right    Pain Descriptors / Indicators Sharp    Pain Type Chronic pain    Pain Onset More than a month ago    Pain Frequency Intermittent    Aggravating Factors  movement, twisting    Pain Relieving Factors avoiding sudden movements                               [facility] Adult PT Treatment/Exercise - 10/26/20 1021       Exercises   Exercises Lumbar      Lumbar Exercises: Stretches   Single  Knee to Chest Stretch Right;Left;3 reps;30 seconds    Piriformis Stretch Right;Left;3 reps;30 seconds      Lumbar Exercises: Aerobic   Nustep L5 x 8 min (O2 down to 88% with symptoms needing rest break with pursed lip breathing)      Lumbar Exercises: Supine   Bridge 5 seconds;20 reps                      PT Short Term Goals - 10/19/20 1412       PT SHORT TERM GOAL #1   Title Pt demonstrates full independence with initial HEP.    Baseline n/a    Time 3    Period Weeks    Status New    Target Date 11/09/20               PT Long Term Goals - 10/19/20 1413       PT LONG TERM GOAL #1   Title Pt is fully independent with advanced HEP.    Time 6    Period Weeks    Status New    Target Date 11/30/20      PT LONG TERM GOAL #2   Title FOTO score improved to 54 for improved function    Baseline -    Time 6    Period Weeks    Status New    Target Date 11/30/20      PT LONG TERM GOAL #3   Title Pt demonstrates ability to perform steps independently for functional stair ambulation to get into home.    Time 6    Period Weeks    Status New    Target Date 11/30/20      PT LONG TERM GOAL #4   Title report pain < 3/10 with activity for improved function and mobility    Baseline -    Time 6    Period Weeks    Status New    Target Date 11/30/20                   Plan - 10/26/20 1058     Clinical Impression Statement Pt reports some improvement in symptoms initially but increased over the past couple of days.  She also reports limited compliance with HEP so stressed importance of HEP and increased actiivty to see benefit and changes.  Will continue to benefit from PT to maximize function.  No goals met as only 2nd visit.    Personal Factors and Comorbidities Comorbidity 3+;Past/Current Experience  Comorbidities Rt TKA, anxiety, arthritis, COPD, depression, Hx DVT/PE, HTN, a-fib, osteopenia, hx back surgery, bil shoulder surgery     Examination-Activity Limitations Locomotion Level;Transfers;Bed Mobility;Stairs;Stand;Lift;Dressing;Hygiene/Grooming    Examination-Participation Restrictions Cleaning;Meal Prep;Community Activity;Driving;Shop    Stability/Clinical Decision Making Evolving/Moderate complexity    Rehab Potential Good    PT Frequency 1x / week    PT Duration 6 weeks    PT Treatment/Interventions ADLs/Self Care Home Management;Cryotherapy;Electrical Stimulation;Traction;Moist Heat;Gait training;Stair training;Functional mobility training;Therapeutic activities;Therapeutic exercise;Balance training;Patient/family education;Neuromuscular re-education;Manual techniques;Taping;Dry needling    PT Next Visit Plan DN PRN, core/hip strengthening, HS stretching, general endurance    PT Home Exercise Plan Access Code: [email or web address]    Consulted and Agree with Plan of Care Patient             Patient will benefit from skilled therapeutic intervention in order to improve the following deficits and impairments:  Pain, Increased fascial restricitons, Increased muscle spasms, Decreased strength, Impaired flexibility, Decreased endurance, Decreased activity tolerance, Postural dysfunction  Visit Diagnosis: Muscle weakness (generalized)  Acute pain of right knee  Abnormality of gait  Unsteadiness on feet     Problem List Patient Active Problem List   Diagnosis Date Noted   Emesis, persistent 05/05/2019   Status post total right knee replacement 12/27/2018   Unilateral primary osteoarthritis, right knee 11/27/2018   Rib fracture 04/22/2018   Pneumothorax on left 01/24/2016   Abnormal CT of the chest    Pulmonary embolism (HCC)    Interstitial lung disease ([place]) 10/08/2015   Paroxysmal atrial fibrillation (HCC)    Typical atrial flutter (HCC)    Chronic diastolic heart failure ([place]) 08/25/2014   Lumbar stenosis 08/11/2014   On amiodarone therapy 05/21/2013   Hypercholesteremia    Essential hypertension     Myalgia and myositis, unspecified    COPD GOLD 0     Overactive bladder       Laureen Abrahams, PT, DPT 10/26/20 11:37 AM     [facility] [address] [place], [place], [address] Phone: [phone]   Fax:  [phone]  Name: Judy White MRN: [id number] Date of Birth: 12-20-1940

## 2020-10-29 DIAGNOSIS — H353121 Nonexudative age-related macular degeneration, left eye, early dry stage: Secondary | ICD-10-CM | POA: Diagnosis not present

## 2020-10-29 DIAGNOSIS — Z961 Presence of intraocular lens: Secondary | ICD-10-CM | POA: Diagnosis not present

## 2020-10-29 DIAGNOSIS — H52201 Unspecified astigmatism, right eye: Secondary | ICD-10-CM | POA: Diagnosis not present

## 2020-11-04 ENCOUNTER — Ambulatory Visit (INDEPENDENT_AMBULATORY_CARE_PROVIDER_SITE_OTHER): Payer: Medicare Other | Admitting: Physical Therapy

## 2020-11-04 ENCOUNTER — Encounter: Payer: Self-pay | Admitting: Physical Therapy

## 2020-11-04 ENCOUNTER — Other Ambulatory Visit: Payer: Self-pay

## 2020-11-04 DIAGNOSIS — M6281 Muscle weakness (generalized): Secondary | ICD-10-CM | POA: Diagnosis not present

## 2020-11-04 DIAGNOSIS — R2681 Unsteadiness on feet: Secondary | ICD-10-CM | POA: Diagnosis not present

## 2020-11-04 DIAGNOSIS — R269 Unspecified abnormalities of gait and mobility: Secondary | ICD-10-CM

## 2020-11-04 DIAGNOSIS — M25561 Pain in right knee: Secondary | ICD-10-CM

## 2020-11-04 NOTE — Therapy (Signed)
[facility] [address] [place], [place], [address] Phone: [phone]   Fax:  [phone]  Physical Therapy Treatment  Patient Details  Name: Judy White MRN: [id number] Date of Birth: [birth year] Referring Provider (PT): Pete Pelt, [place]   Encounter Date: 11/04/2020   PT End of Session - 11/04/20 1418     Visit Number 3    Number of Visits 6    Date for PT Re-Evaluation 11/30/20    Authorization Type Medicare/BCBS    Progress Note Due on Visit 10    PT Start Time [id number]    PT Stop Time [id number]    PT Time Calculation (min) 42 min    Activity Tolerance Patient tolerated treatment well    Behavior During Therapy [facility] for tasks assessed/performed             Past Medical History:  Diagnosis Date   Agoraphobia    Anxiety    Arthritis    "normal for the age; nothing gives me problems" (01/24/2016)   COPD (chronic obstructive pulmonary disease) ([place])    "mild" (01/24/2016)   Depressive disorder, not elsewhere classified    DVT (deep venous thrombosis) ([place]) 10/08/2015   "they said the blood clot went from one of my legs to my lung"   History of hiatal hernia    "small one" (01/24/2016)   Hypercholesteremia    Hypertension    pt denies this hx on 01/24/2016   Myalgia and myositis, unspecified    Osteopenia    Overactive bladder    Persistent atrial fibrillation ([place])    a. s/p Watchman implant 12/2014   Presence of Watchman left atrial appendage closure device    Pulmonary embolism ([place]) 10/08/2015   Sleep apnea    USES CPAP     Past Surgical History:  Procedure Laterality Date   APPENDECTOMY  1983   ATRIAL FIBRILLATION ABLATION  06/28/2017   PER PATIENT SUCCESSFUL ABLATION , [place] AND [facility] , [place]   BACK SURGERY     BRONCHOSCOPY  01/24/2016   CARDIAC CATHETERIZATION     >5 yrs   CATARACT EXTRACTION W/ INTRAOCULAR LENS  IMPLANT, BILATERAL Bilateral    [place] N/A 12/31/2014   Procedure: LEFT ATRIAL APPENDAGE OCCLUSION;  Surgeon: Thompson Grayer, MD;  Location: [place] [facility];  Service: Cardiovascular;  Laterality: N/A;   POSTERIOR FUSION LUMBAR SPINE Bilateral 07/2014   SHOULDER ARTHROSCOPY W/ ROTATOR CUFF REPAIR Left    SHOULDER SURGERY Right    "tendon broke; couldn't be repaired"   TEE WITHOUT CARDIOVERSION N/A 12/22/2014   Procedure: TRANSESOPHAGEAL ECHOCARDIOGRAM (TEE);  Surgeon: Pixie Casino, MD;  Location: [facility] ENDOSCOPY;  Service: Cardiovascular;  Laterality: N/A;   TEE WITHOUT CARDIOVERSION N/A 02/16/2015   post Watchman TEE with good seal and no leak around device    TONSILLECTOMY     TOOTH EXTRACTION  05/2018   TOTAL ABDOMINAL HYSTERECTOMY     TOTAL KNEE ARTHROPLASTY Right 12/27/2018   Procedure: RIGHT TOTAL KNEE ARTHROPLASTY;  Surgeon: Mcarthur Rossetti, MD;  Location: [facility];  Service: Orthopedics;  Laterality: Right;   VIDEO BRONCHOSCOPY Bilateral 01/24/2016   Procedure: VIDEO BRONCHOSCOPY WITH FLUORO;  Surgeon: Collene Gobble, MD;  Location: [place];  Service: Cardiopulmonary;  Laterality: Bilateral;   WISDOM TOOTH EXTRACTION      There were no vitals filed for this visit.   Subjective Assessment - 11/04/20 1345  Subjective leaning back and riding bike was very painful,  once she got off the bike pain resolved.  exercises improved pain    Limitations Standing;Walking;Sitting    How long can you sit comfortably? "forever"    Patient Stated Goals improve pain    Currently in Pain? No/denies                               [facility] Adult PT Treatment/Exercise - 11/04/20 1348       Lumbar Exercises: Stretches   Passive Hamstring Stretch Right;Left;3 reps;30 seconds    Passive Hamstring Stretch Limitations seated      Lumbar Exercises: Aerobic   Nustep L6 x 6 min      Lumbar Exercises: Machines for Strengthening   Leg Press 75# 2x10      Lumbar Exercises: Supine    Other Supine Lumbar Exercises RLE isometric hip extension 10 x 5 sec              Trigger Point Dry Needling - 11/04/20 1404     Consent Given? Yes    Education Handout Provided Previously provided    Muscles Treated Back/Hip Gluteus medius    Electrical Stimulation Performed with Dry Needling Yes    E-stim with Dry Needling Details to Rt glute med x 4 min    Gluteus Medius Response Twitch response elicited                     PT Short Term Goals - 10/19/20 1412       PT SHORT TERM GOAL #1   Title Pt demonstrates full independence with initial HEP.    Baseline n/a    Time 3    Period Weeks    Status New    Target Date 11/09/20               PT Long Term Goals - 10/19/20 1413       PT LONG TERM GOAL #1   Title Pt is fully independent with advanced HEP.    Time 6    Period Weeks    Status New    Target Date 11/30/20      PT LONG TERM GOAL #2   Title FOTO score improved to 54 for improved function    Baseline -    Time 6    Period Weeks    Status New    Target Date 11/30/20      PT LONG TERM GOAL #3   Title Pt demonstrates ability to perform steps independently for functional stair ambulation to get into home.    Time 6    Period Weeks    Status New    Target Date 11/30/20      PT LONG TERM GOAL #4   Title report pain < 3/10 with activity for improved function and mobility    Baseline -    Time 6    Period Weeks    Status New    Target Date 11/30/20                   Plan - 11/04/20 1418     Clinical Impression Statement Pt tolerated session well today with positive response to treatment interventions today.  Incorporated some SI stab exercises today and encouraged to try at home.  Will continue to benefit from PT to maximize function.    Personal Factors and Comorbidities Comorbidity  3+;Past/Current Experience    Comorbidities Rt TKA, anxiety, arthritis, COPD, depression, Hx DVT/PE, HTN, a-fib, osteopenia, hx back surgery,  bil shoulder surgery    Examination-Activity Limitations Locomotion Level;Transfers;Bed Mobility;Stairs;Stand;Lift;Dressing;Hygiene/Grooming    Examination-Participation Restrictions Cleaning;Meal Prep;Community Activity;Driving;Shop    Stability/Clinical Decision Making Evolving/Moderate complexity    Rehab Potential Good    PT Frequency 1x / week    PT Duration 6 weeks    PT Treatment/Interventions ADLs/Self Care Home Management;Cryotherapy;Electrical Stimulation;Traction;Moist Heat;Gait training;Stair training;Functional mobility training;Therapeutic activities;Therapeutic exercise;Balance training;Patient/family education;Neuromuscular re-education;Manual techniques;Taping;Dry needling    PT Next Visit Plan asses response to DN, core/hip strengthening, HS stretching, general endurance    PT Home Exercise Plan Access Code: [email or web address]    Consulted and Agree with Plan of Care Patient             Patient will benefit from skilled therapeutic intervention in order to improve the following deficits and impairments:  Pain, Increased fascial restricitons, Increased muscle spasms, Decreased strength, Impaired flexibility, Decreased endurance, Decreased activity tolerance, Postural dysfunction  Visit Diagnosis: Muscle weakness (generalized)  Acute pain of right knee  Abnormality of gait  Unsteadiness on feet     Problem List Patient Active Problem List   Diagnosis Date Noted   Emesis, persistent 05/05/2019   Status post total right knee replacement 12/27/2018   Unilateral primary osteoarthritis, right knee 11/27/2018   Rib fracture 04/22/2018   Pneumothorax on left 01/24/2016   Abnormal CT of the chest    Pulmonary embolism (H[facility])    Interstitial lung disease ([place]) 10/08/2015   Paroxysmal atrial fibrillation (HCC)    Typical atrial flutter (HCC)    Chronic diastolic heart failure ([place]) 08/25/2014   Lumbar stenosis 08/11/2014   On amiodarone therapy 05/21/2013    Hypercholesteremia    Essential hypertension    Myalgia and myositis, unspecified    COPD GOLD 0     Overactive bladder      Laureen Abrahams, PT, DPT 11/04/20 2:20 PM     [facility] [address] [place], [place], [address] Phone: [phone]   Fax:  [phone]  Name: ASLEE EDLIN MRN: [id number] Date of Birth: 09-Aug-1940

## 2020-11-09 ENCOUNTER — Other Ambulatory Visit: Payer: Self-pay

## 2020-11-09 ENCOUNTER — Ambulatory Visit (INDEPENDENT_AMBULATORY_CARE_PROVIDER_SITE_OTHER): Payer: Medicare Other | Admitting: Physical Therapy

## 2020-11-09 ENCOUNTER — Encounter: Payer: Self-pay | Admitting: Physical Therapy

## 2020-11-09 DIAGNOSIS — R2681 Unsteadiness on feet: Secondary | ICD-10-CM

## 2020-11-09 DIAGNOSIS — M25561 Pain in right knee: Secondary | ICD-10-CM

## 2020-11-09 DIAGNOSIS — M6281 Muscle weakness (generalized): Secondary | ICD-10-CM

## 2020-11-09 DIAGNOSIS — R269 Unspecified abnormalities of gait and mobility: Secondary | ICD-10-CM

## 2020-11-09 NOTE — Therapy (Addendum)
[facility] [address] [place], [place], [address] Phone: [phone]   Fax:  [phone]  Physical Therapy Treatment/Discharge Summary  Patient Details  Name: Judy White MRN: [id number] Date of Birth: 31-Jul-1940 Referring Provider (PT): Pete Pelt, [place]   Encounter Date: 11/09/2020   PT End of Session - 11/09/20 1250     Visit Number 4    Number of Visits 6    Date for PT Re-Evaluation 11/30/20    Authorization Type Medicare/BCBS    Progress Note Due on Visit 10    PT Start Time [id number]    PT Stop Time 1225    PT Time Calculation (min) 41 min    Activity Tolerance Patient tolerated treatment well    Behavior During Therapy [employer] [facility] for tasks assessed/performed             Past Medical History:  Diagnosis Date   Agoraphobia    Anxiety    Arthritis    "normal for the age; nothing gives me problems" (01/24/2016)   COPD (chronic obstructive pulmonary disease) ([place])    "mild" (01/24/2016)   Depressive disorder, not elsewhere classified    DVT (deep venous thrombosis) ([place]) 10/08/2015   "they said the blood clot went from one of my legs to my lung"   History of hiatal hernia    "small one" (01/24/2016)   Hypercholesteremia    Hypertension    pt denies this hx on 01/24/2016   Myalgia and myositis, unspecified    Osteopenia    Overactive bladder    Persistent atrial fibrillation ([place])    a. s/p Watchman implant 12/2014   Presence of Watchman left atrial appendage closure device    Pulmonary embolism ([place]) 10/08/2015   Sleep apnea    USES CPAP     Past Surgical History:  Procedure Laterality Date   APPENDECTOMY  1983   ATRIAL FIBRILLATION ABLATION  06/28/2017   PER PATIENT SUCCESSFUL ABLATION , [place] AND [facility] , [place]   BACK SURGERY     BRONCHOSCOPY  01/24/2016   CARDIAC CATHETERIZATION     >5 yrs   CATARACT EXTRACTION W/ INTRAOCULAR LENS  IMPLANT, BILATERAL Bilateral    [place] N/A 12/31/2014   Procedure: LEFT ATRIAL APPENDAGE OCCLUSION;  Surgeon: Thompson Grayer, MD;  Location: [place] [facility];  Service: Cardiovascular;  Laterality: N/A;   POSTERIOR FUSION LUMBAR SPINE Bilateral 07/2014   SHOULDER ARTHROSCOPY W/ ROTATOR CUFF REPAIR Left    SHOULDER SURGERY Right    "tendon broke; couldn't be repaired"   TEE WITHOUT CARDIOVERSION N/A 12/22/2014   Procedure: TRANSESOPHAGEAL ECHOCARDIOGRAM (TEE);  Surgeon: Pixie Casino, MD;  Location: [facility] ENDOSCOPY;  Service: Cardiovascular;  Laterality: N/A;   TEE WITHOUT CARDIOVERSION N/A 02/16/2015   post Watchman TEE with good seal and no leak around device    TONSILLECTOMY     TOOTH EXTRACTION  05/2018   TOTAL ABDOMINAL HYSTERECTOMY     TOTAL KNEE ARTHROPLASTY Right 12/27/2018   Procedure: RIGHT TOTAL KNEE ARTHROPLASTY;  Surgeon: Mcarthur Rossetti, MD;  [facility];  Service: Orthopedics;  Laterality: Right;   VIDEO BRONCHOSCOPY Bilateral 01/24/2016   Procedure: VIDEO BRONCHOSCOPY WITH FLUORO;  Surgeon: Collene Gobble, MD;  Location: [place];  Service: Cardiopulmonary;  Laterality: Bilateral;   WISDOM TOOTH EXTRACTION      There were no vitals filed for this visit.   Subjective Assessment - 11/09/20  1146     Subjective pain has moved up.  " I think I have cancer in the bone."    Limitations Standing;Walking;Sitting    How long can you sit comfortably? "forever"    Patient Stated Goals improve pain    Currently in Pain? Yes    Pain Score --   "It hurts!"   Pain Location Back    Pain Orientation Right    Pain Descriptors / Indicators Sharp    Pain Type Chronic pain    Pain Onset More than a month ago    Pain Frequency Intermittent    Aggravating Factors  movement, twisting    Pain Relieving Factors avoiding sudden movements                [facility] PT Assessment - 11/09/20 1247       Assessment   Medical Diagnosis M54.50 (ICD-10-CM) - Low back pain,  unspecified back pain laterality, unspecified chronicity, unspecified whether sciatica present  M76.51 (ICD-10-CM) - Patellar tendinitis of right knee  M70.61 (ICD-10-CM) - Trochanteric bursitis, right hip    Referring Provider (PT) Kirtland Bouchard, PA-C      Observation/Other Assessments   Focus on Therapeutic Outcomes (FOTO)  44                           [facility] Adult PT Treatment/Exercise - 11/09/20 1148       Lumbar Exercises: Aerobic   Nustep L6 x 10 min      Lumbar Exercises: Machines for Strengthening   Leg Press 100# 3x10      Lumbar Exercises: Supine   Other Supine Lumbar Exercises RLE isometric hip extension 10 x 5 sec      Manual Therapy   Manual Therapy Soft tissue mobilization    Manual therapy comments skilled palpation and monitoring of soft tissue during DN    Soft tissue mobilization STM with compression to Rt QL and lumbar paraspinals              Trigger Point Dry Needling - 11/09/20 1248     Consent Given? Yes    Education Handout Provided Previously provided    Muscles Treated Back/Hip Erector spinae;Quadratus lumborum    Erector spinae Response Twitch response elicited    Quadratus Lumborum Response Twitch response elicited                     PT Short Term Goals - 11/09/20 1250       PT SHORT TERM GOAL #1   Title Pt demonstrates full independence with initial HEP.    Baseline 9/13: met except reports limited compliance    Time 3    Period Weeks    Status Partially Met    Target Date 11/09/20               PT Long Term Goals - 11/09/20 1251       PT LONG TERM GOAL #1   Title Pt is fully independent with advanced HEP.    Time 6    Period Weeks    Status On-going    Target Date 11/30/20      PT LONG TERM GOAL #2   Title FOTO score improved to 54 for improved function    Baseline -    Time 6    Period Weeks    Status On-going      PT LONG TERM GOAL #3  Title Pt demonstrates ability to perform  steps independently for functional stair ambulation to get into home.    Time 6    Period Weeks    Status On-going      PT LONG TERM GOAL #4   Title report pain < 3/10 with activity for improved function and mobility    Baseline -    Time 6    Period Weeks    Status On-going                   Plan - 11/09/20 1251     Clinical Impression Statement At this time pt has partially met her STG and all LTGs are ongoing.  She reports limited compliance with her HEP, but does report benefit when completing her HEP.  Pt to see MD tomorrow so will await further recommendations.    Personal Factors and Comorbidities Comorbidity 3+;Past/Current Experience    Comorbidities Rt TKA, anxiety, arthritis, COPD, depression, Hx DVT/PE, HTN, a-fib, osteopenia, hx back surgery, bil shoulder surgery    Examination-Activity Limitations Locomotion Level;Transfers;Bed Mobility;Stairs;Stand;Lift;Dressing;Hygiene/Grooming    Examination-Participation Restrictions Cleaning;Meal Prep;Community Activity;Driving;Shop    Stability/Clinical Decision Making Evolving/Moderate complexity    Rehab Potential Good    PT Frequency 1x / week    PT Duration 6 weeks    PT Treatment/Interventions ADLs/Self Care Home Management;Cryotherapy;Electrical Stimulation;Traction;Moist Heat;Gait training;Stair training;Functional mobility training;Therapeutic activities;Therapeutic exercise;Balance training;Patient/family education;Neuromuscular re-education;Manual techniques;Taping;Dry needling    PT Next Visit Plan asses response to DN, core/hip strengthening, HS stretching, general endurance, see what MD says    PT Home Exercise Plan Access Code: [email or web address]    Consulted and Agree with Plan of Care Patient             Patient will benefit from skilled therapeutic intervention in order to improve the following deficits and impairments:  Pain, Increased fascial restricitons, Increased muscle spasms, Decreased strength, Impaired  flexibility, Decreased endurance, Decreased activity tolerance, Postural dysfunction  Visit Diagnosis: Muscle weakness (generalized)  Acute pain of right knee  Abnormality of gait  Unsteadiness on feet     Problem List Patient Active Problem List   Diagnosis Date Noted   Emesis, persistent 05/05/2019   Status post total right knee replacement 12/27/2018   Unilateral primary osteoarthritis, right knee 11/27/2018   Rib fracture 04/22/2018   Pneumothorax on left 01/24/2016   Abnormal CT of the chest    Pulmonary embolism (HCC)    Interstitial lung disease ([place]) 10/08/2015   Paroxysmal atrial fibrillation (HCC)    Typical atrial flutter (HCC)    Chronic diastolic heart failure ([place]) 08/25/2014   Lumbar stenosis 08/11/2014   On amiodarone therapy 05/21/2013   Hypercholesteremia    Essential hypertension    Myalgia and myositis, unspecified    COPD GOLD 0     Overactive bladder       Laureen Abrahams, PT, DPT 11/09/20 12:54 PM      [facility] [address] [place], [place], [address] Phone: [phone]   Fax:  [phone]  Name: Judy White MRN: [id number] Date of Birth: Oct 15, 1940      PHYSICAL THERAPY DISCHARGE SUMMARY  Visits from Start of Care: 4  Current functional level related to goals / functional outcomes: See above   Remaining deficits: unknown   Education / Equipment: HEP   Patient agrees to discharge. Patient goals were not met. Patient is being discharged due to not returning since the last visit.  Laureen Abrahams, PT, DPT 12/13/20  12:22 PM  [facility] [address] [place], [place], [address] Phone: [phone]   Fax:  [phone]

## 2020-11-10 ENCOUNTER — Ambulatory Visit (INDEPENDENT_AMBULATORY_CARE_PROVIDER_SITE_OTHER): Payer: Medicare Other | Admitting: Physician Assistant

## 2020-11-10 ENCOUNTER — Encounter: Payer: Self-pay | Admitting: Physician Assistant

## 2020-11-10 DIAGNOSIS — M545 Low back pain, unspecified: Secondary | ICD-10-CM | POA: Diagnosis not present

## 2020-11-10 MED ORDER — LIDOCAINE HCL 1 % IJ SOLN
1.0000 mL | INTRAMUSCULAR | Status: AC | PRN
  Administered 2020-11-10: 1 mL

## 2020-11-10 MED ORDER — METHYLPREDNISOLONE ACETATE 40 MG/ML IJ SUSP
40.0000 mg | INTRAMUSCULAR | Status: AC | PRN
  Administered 2020-11-10: 40 mg via INTRAMUSCULAR

## 2020-11-10 NOTE — Progress Notes (Signed)
   Procedure Note  Patient: Judy White             Date of Birth: [birth year]           MRN: [id number]             Visit Date: 11/10/2020 HPI: Judy White returns today for follow-up of her low back pain and radicular symptoms down the right leg.  She states that she is no longer having any radicular symptoms down the right leg.  However her pain is now moved up into her low back region.  She has had no new injuries.  She is going to therapy.  Review of systems see HPI otherwise negative.  Physical exam: General well-developed well-nourished female no acute distress. Lower extremities: She ambulates without any assistive device.  Unstable gait on the off the exam table on her own.  Minimal tenderness over the right hip trochanteric region.  Good range of motion right hip without pain.  Nontender over the right knee.  No abnormal warmth erythema or effusion right knee. Back: Trigger points right paraspinous region at level of L4.  No rashes skin lesions ulcerations over the back.  Procedures: Visit Diagnoses:  1. Low back pain, unspecified back pain laterality, unspecified chronicity, unspecified whether sciatica present     Trigger Point Inj  Date/Time: 11/10/2020 4:54 PM Performed by: Pete Pelt, PA-C Authorized by: Pete Pelt, PA-C   Total # of Trigger Points:  1 Location: back   Needle Size:  25 G Approach:  Dorsal Medications #1:  1 mL lidocaine 1 %; 40 mg methylPREDNISolone acetate 40 MG/ML  Plan: Recommend she continue physical therapy to work on core strengthening, stretching, modalities and home exercise program.  We will see how she does with the trigger point injection which she tolerated well today.  She will follow-up with [place] in 1 month to see how she is doing overall.  However my next step would be to refer her back to neurosurgery if she continues to have back pain or develops radicular symptoms down the leg.  Questions encouraged and answered.

## 2020-11-15 DIAGNOSIS — R0600 Dyspnea, unspecified: Secondary | ICD-10-CM | POA: Diagnosis not present

## 2020-11-15 DIAGNOSIS — R0902 Hypoxemia: Secondary | ICD-10-CM | POA: Diagnosis not present

## 2020-11-15 DIAGNOSIS — G4733 Obstructive sleep apnea (adult) (pediatric): Secondary | ICD-10-CM | POA: Diagnosis not present

## 2020-11-15 DIAGNOSIS — I10 Essential (primary) hypertension: Secondary | ICD-10-CM | POA: Diagnosis not present

## 2020-11-15 DIAGNOSIS — I480 Paroxysmal atrial fibrillation: Secondary | ICD-10-CM | POA: Diagnosis not present

## 2020-11-16 ENCOUNTER — Encounter: Payer: Medicare Other | Admitting: Physical Therapy

## 2020-11-16 DIAGNOSIS — R0902 Hypoxemia: Secondary | ICD-10-CM | POA: Diagnosis not present

## 2020-11-23 ENCOUNTER — Encounter: Payer: Medicare Other | Admitting: Physical Therapy

## 2020-11-23 ENCOUNTER — Ambulatory Visit
Admission: RE | Admit: 2020-11-23 | Discharge: 2020-11-23 | Disposition: A | Discharge status: Home / Self Care | Payer: Medicare Other | Source: Ambulatory Visit | Attending: Internal Medicine | Admitting: Internal Medicine

## 2020-11-23 ENCOUNTER — Other Ambulatory Visit: Payer: Self-pay | Admitting: Internal Medicine

## 2020-11-23 DIAGNOSIS — J432 Centrilobular emphysema: Secondary | ICD-10-CM | POA: Diagnosis not present

## 2020-11-23 DIAGNOSIS — G4730 Sleep apnea, unspecified: Secondary | ICD-10-CM | POA: Diagnosis not present

## 2020-11-23 DIAGNOSIS — Z86711 Personal history of pulmonary embolism: Secondary | ICD-10-CM | POA: Diagnosis not present

## 2020-11-23 DIAGNOSIS — I700 Atherosclerosis of aorta: Secondary | ICD-10-CM | POA: Diagnosis not present

## 2020-11-23 DIAGNOSIS — R0902 Hypoxemia: Secondary | ICD-10-CM

## 2020-11-23 DIAGNOSIS — I2510 Atherosclerotic heart disease of native coronary artery without angina pectoris: Secondary | ICD-10-CM | POA: Diagnosis not present

## 2020-11-23 DIAGNOSIS — R0609 Other forms of dyspnea: Secondary | ICD-10-CM | POA: Diagnosis not present

## 2020-11-23 MED ORDER — IOPAMIDOL (ISOVUE-370) INJECTION 76%
75.0000 mL | Freq: Once | INTRAVENOUS | Status: AC | PRN
  Administered 2020-11-23: 75 mL via INTRAVENOUS

## 2020-11-24 DIAGNOSIS — R0609 Other forms of dyspnea: Secondary | ICD-10-CM | POA: Diagnosis not present

## 2020-11-24 DIAGNOSIS — R0902 Hypoxemia: Secondary | ICD-10-CM | POA: Diagnosis not present

## 2020-11-30 ENCOUNTER — Encounter: Payer: Medicare Other | Admitting: Physical Therapy

## 2020-12-02 DIAGNOSIS — J449 Chronic obstructive pulmonary disease, unspecified: Secondary | ICD-10-CM | POA: Diagnosis not present

## 2020-12-02 DIAGNOSIS — G4730 Sleep apnea, unspecified: Secondary | ICD-10-CM | POA: Diagnosis not present

## 2020-12-02 DIAGNOSIS — G720 Drug-induced myopathy: Secondary | ICD-10-CM | POA: Diagnosis not present

## 2020-12-02 DIAGNOSIS — Z23 Encounter for immunization: Secondary | ICD-10-CM | POA: Diagnosis not present

## 2020-12-02 DIAGNOSIS — S39012A Strain of muscle, fascia and tendon of lower back, initial encounter: Secondary | ICD-10-CM | POA: Diagnosis not present

## 2020-12-02 DIAGNOSIS — R0902 Hypoxemia: Secondary | ICD-10-CM | POA: Diagnosis not present

## 2020-12-02 DIAGNOSIS — Z9981 Dependence on supplemental oxygen: Secondary | ICD-10-CM | POA: Diagnosis not present

## 2020-12-13 DIAGNOSIS — L820 Inflamed seborrheic keratosis: Secondary | ICD-10-CM | POA: Diagnosis not present

## 2020-12-13 DIAGNOSIS — Z85828 Personal history of other malignant neoplasm of skin: Secondary | ICD-10-CM | POA: Diagnosis not present

## 2020-12-14 DIAGNOSIS — Z23 Encounter for immunization: Secondary | ICD-10-CM | POA: Diagnosis not present

## 2021-01-09 NOTE — Progress Notes (Signed)
Cardiology Office Note:    Date:  01/10/2021   ID:  Judy White, DOB September 29, 1940, MRN [id number]  PCP:  Judy Huddle, MD  Cardiologist:  Judy Grooms, MD   Referring MD: Judy Huddle, MD   Chief Complaint  Patient presents with   Atrial Fibrillation   Hyperlipidemia    History of Present Illness:    Judy White is a 80 y.o. female with a hx of  paroxysmal atrial fibrillation with successful ablation, Watchman implantation 2016, amiodarone therapy discontinued after ablation, diastolic heart failure, unable to use anticoagulation due to intracranial hemorrhage, essential hypertension, sleep apnea with CPAP compliance, history of DVT with pulmonary emboli, and anxiety disorder.    She is doing well.  Her husband J.  C.  Died in 04-27-22 from metastatic lung cancer.  She now lives alone but has good family support.  She is on continuous oxygen due to COPD.  She is sleeping better.  She denies palpitations, chest pain, and peripheral edema.  No orthopnea or PND.  Past Medical History:  Diagnosis Date   Agoraphobia    Anxiety    Arthritis    "normal for the age; nothing gives me problems" (01/24/2016)   COPD (chronic obstructive pulmonary disease) ([place])    "mild" (01/24/2016)   Depressive disorder, not elsewhere classified    DVT (deep venous thrombosis) ([place]) 10/08/2015   "they said the blood clot went from one of my legs to my lung"   History of hiatal hernia    "small one" (01/24/2016)   Hypercholesteremia    Hypertension    pt denies this hx on 01/24/2016   Myalgia and myositis, unspecified    Osteopenia    Overactive bladder    Persistent atrial fibrillation ([place])    a. s/p Watchman implant 12/2014   Presence of Watchman left atrial appendage closure device    Pulmonary embolism ([place]) 10/08/2015   Sleep apnea    USES CPAP     Past Surgical History:  Procedure Laterality Date   APPENDECTOMY  1983   ATRIAL FIBRILLATION ABLATION  06/28/2017   PER PATIENT  SUCCESSFUL ABLATION , [place] , [place]   BACK SURGERY     BRONCHOSCOPY  01/24/2016   CARDIAC CATHETERIZATION     >5 yrs   CATARACT EXTRACTION W/ INTRAOCULAR LENS  IMPLANT, BILATERAL Bilateral    CESAREAN SECTION  1967   LEFT ATRIAL APPENDAGE OCCLUSION N/A 12/31/2014   Procedure: LEFT ATRIAL APPENDAGE OCCLUSION;  Surgeon: Thompson Grayer, MD;  Location: [facility];  Service: Cardiovascular;  Laterality: N/A;   POSTERIOR FUSION LUMBAR SPINE Bilateral 07/2014   SHOULDER ARTHROSCOPY W/ ROTATOR CUFF REPAIR Left    SHOULDER SURGERY Right    "tendon broke; couldn't be repaired"   TEE WITHOUT CARDIOVERSION N/A 12/22/2014   Procedure: TRANSESOPHAGEAL ECHOCARDIOGRAM (TEE);  Surgeon: Pixie Casino, MD;  Location: [facility] ENDOSCOPY;  Service: Cardiovascular;  Laterality: N/A;   TEE WITHOUT CARDIOVERSION N/A 02/16/2015   post Watchman TEE with good seal and no leak around device    TONSILLECTOMY     TOOTH EXTRACTION  05/2018   TOTAL ABDOMINAL HYSTERECTOMY     TOTAL KNEE ARTHROPLASTY Right 12/27/2018   Procedure: RIGHT TOTAL KNEE ARTHROPLASTY;  Surgeon: Mcarthur Rossetti, MD;  Location: [facility];  Service: Orthopedics;  Laterality: Right;   VIDEO BRONCHOSCOPY Bilateral 01/24/2016   Procedure: VIDEO BRONCHOSCOPY WITH FLUORO;  Surgeon: Collene Gobble, MD;  Location: [facility]  ENDOSCOPY;  Service: Cardiopulmonary;  Laterality: Bilateral;   WISDOM TOOTH EXTRACTION      Current Medications: Current Meds  Medication Sig   acetaminophen (TYLENOL) 500 MG tablet Take 500 mg by mouth every 6 (six) hours as needed for moderate pain or headache.   aspirin EC 81 MG tablet Take 81 mg by mouth daily.   Benfotiamine 150 MG CAPS Take 150 mg by mouth daily.   Calcium-Vitamin D (CALTRATE 600 PLUS-VIT D PO) Take 1 tablet by mouth daily.    Cholecalciferol (VITAMIN D3) 2000 units TABS Take 2,000 Units by mouth daily.   clonazePAM (KLONOPIN) 1 MG tablet Take 1.5-2 mg by mouth daily.     FLUoxetine (PROZAC) 20 MG capsule Take 20 mg by mouth daily.    furosemide (LASIX) 20 MG tablet Take 1 tablet (20 mg total) by mouth daily.   gabapentin (NEURONTIN) 100 MG capsule Take 100 mg by mouth 3 (three) times daily.   metoprolol succinate (TOPROL-XL) 25 MG 24 hr tablet Take 12.5 mg by mouth every morning.   oxybutynin (DITROPAN-XL) 10 MG 24 hr tablet Take 10 mg by mouth daily.    Potassium 99 MG TABS Take 99 mg by mouth daily.   vitamin C (ASCORBIC ACID) 500 MG tablet Take 500 mg by mouth daily.   vitamin E 180 MG (400 UNITS) capsule Take 400 Units daily by mouth.     Allergies:   Bupropion, Rosuvastatin, and Statins   Social History   Socioeconomic History   Marital status: Widowed    Spouse name: Not on file   Number of children: Not on file   Years of education: Not on file   Highest education level: Not on file  Occupational History   Not on file  Tobacco Use   Smoking status: Former    Packs/day: 0.50    Years: 60.00    Pack years: 30.00    Types: Cigarettes    Quit date: 10/08/2015    Years since quitting: 5.2   Smokeless tobacco: Never  Vaping Use   Vaping Use: Never used  Substance and Sexual Activity   Alcohol use: Yes    Alcohol/week: 0.0 standard drinks    Comment: occ   Drug use: No   Sexual activity: Not on file  Other Topics Concern   Not on file  Social History Narrative   Not on file   Social Determinants of Health   Financial Resource Strain: Not on file  Food Insecurity: Not on file  Transportation Needs: Not on file  Physical Activity: Not on file  Stress: Not on file  Social Connections: Not on file     Family History: The patient's family history includes Aneurysm in her father; Cancer in an other family member; Heart Problems in an other family member; Heart disease in her brother; Hypotension in her mother; Neuropathy in her brother. There is no history of Heart attack.  ROS:   Please see the history of present illness.    No  cardiac complaints.  Has pain all the time.  Not walking.  Gaining weight.  Feet hurt.  All other systems reviewed and are negative.  EKGs/Labs/Other Studies Reviewed:    The following studies were reviewed today:  CHEST CT SCAN 10/2020: IMPRESSION: 1. Negative examination for pulmonary embolism. 2. Emphysema. 3. Coronary artery disease.   Aortic Atherosclerosis (ICD10-I70.0) and Emphysema (ICD10-J43.9).  EKG:  EKG sinus rhythm, PACs, relatively low limb lead voltage, PACs, and when compared to the prior  tracing performed April 01, 2020, PACs are new.  Recent Labs: No results found for requested labs within last 8760 hours.  Recent Lipid Panel    Component Value Date/Time   CHOL 185 12/28/2017 0927   TRIG 218 (H) 12/28/2017 0927   HDL 41 12/28/2017 0927   CHOLHDL 4.5 (H) 12/28/2017 0927   LDLCALC 100 (H) 12/28/2017 0927    Physical Exam:    VS:  BP 122/70   Pulse 70   Ht 5\' 6"  (1.676 m)   Wt 195 lb (88.5 kg)   SpO2 96%   BMI 31.47 kg/m     Wt Readings from Last 3 Encounters:  01/10/21 195 lb (88.5 kg)  04/01/20 189 lb 6.4 oz (85.9 kg)  05/05/19 184 lb 12.8 oz (83.8 kg)     GEN: Overweight. No acute distress HEENT: Normal NECK: No JVD. LYMPHATICS: No lymphadenopathy CARDIAC: No murmur. RRR no gallop, or edema. VASCULAR:  Normal Pulses. No bruits. RESPIRATORY:  Clear to auscultation without rales, wheezing or rhonchi  ABDOMEN: Soft, non-tender, non-distended, No pulsatile mass, MUSCULOSKELETAL: No deformity  SKIN: Warm and dry NEUROLOGIC:  Alert and oriented x 3 PSYCHIATRIC:  Normal affect   ASSESSMENT:    1. Paroxysmal atrial fibrillation (HCC)   2. Chronic diastolic heart failure ([place])   3. Essential hypertension   4. Long-term current use of high risk medication other than anticoagulant   5. Hypercholesteremia    PLAN:    In order of problems listed above:  She is maintaining normal sinus rhythm. Needs updated echocardiogram.  She has no  symptoms currently of heart failure.  Major issue is hypoxia related to COPD. Blood pressure control is excellent on low-dose therapy that includes Toprol-XL 12.5 mg/day and furosemide 20 mg daily. Not on anticoagulation therapy in sinus rhythm after ablation she is on aspirin 81 mg/day. Has not been able to tolerate statin therapy in the past due to musculoskeletal discomfort.  He has not been interested in trying alternative therapies.  [facility] clinical follow-up in 1 year.  Low threshold to perform an echocardiogram if any peripheral edema or evidence of volume overload.   Medication Adjustments/Labs and Tests Ordered: Current medicines are reviewed at length with the patient today.  Concerns regarding medicines are outlined above.  Orders Placed This Encounter  Procedures   EKG 12-Lead   No orders of the defined types were placed in this encounter.   Patient Instructions  Medication Instructions:  Your physician recommends that you continue on your current medications as directed. Please refer to the Current Medication list given to you today.  *If you need a refill on your cardiac medications before your next appointment, please call your pharmacy*   Lab Work: None  If you have labs (blood work) drawn today and your tests are completely normal, you will receive your results only by: [place] (if you have MyChart) OR A paper copy in the mail If you have any lab test that is abnormal or we need to change your treatment, we will call you to review the results.   Testing/Procedures: None   Follow-Up: At [facility], you and your health needs are our priority.  As part of our continuing mission to provide you with exceptional heart care, we have created designated Provider Care Teams.  These Care Teams include your primary Cardiologist (physician) and Advanced Practice Providers (APPs -  Physician Assistants and Nurse Practitioners) who all work together to provide you  with the care you need,  when you need it.  We recommend signing up for the patient portal called "MyChart".  Sign up information is provided on this After Visit Summary.  MyChart is used to connect with patients for Virtual Visits (Telemedicine).  Patients are able to view lab/test results, encounter notes, upcoming appointments, etc.  Non-urgent messages can be sent to your provider as well.   To learn more about what you can do with MyChart, go to [email or web address].    Your next appointment:   1 year(s)  The format for your next appointment:   In Person  Provider:   Sinclair Grooms, MD      Signed, Judy Grooms, MD  01/10/2021 10:02 AM    [place]

## 2021-01-10 ENCOUNTER — Other Ambulatory Visit: Payer: Self-pay

## 2021-01-10 ENCOUNTER — Encounter: Payer: Self-pay | Admitting: Interventional Cardiology

## 2021-01-10 ENCOUNTER — Ambulatory Visit (INDEPENDENT_AMBULATORY_CARE_PROVIDER_SITE_OTHER): Payer: Medicare Other | Admitting: Interventional Cardiology

## 2021-01-10 VITALS — BP 122/70 | HR 70 | Ht 66.0 in | Wt 195.0 lb

## 2021-01-10 DIAGNOSIS — I10 Essential (primary) hypertension: Secondary | ICD-10-CM

## 2021-01-10 DIAGNOSIS — I480 Paroxysmal atrial fibrillation: Secondary | ICD-10-CM | POA: Diagnosis not present

## 2021-01-10 DIAGNOSIS — E7800 Pure hypercholesterolemia, unspecified: Secondary | ICD-10-CM | POA: Diagnosis not present

## 2021-01-10 DIAGNOSIS — Z79899 Other long term (current) drug therapy: Secondary | ICD-10-CM | POA: Diagnosis not present

## 2021-01-10 DIAGNOSIS — I5032 Chronic diastolic (congestive) heart failure: Secondary | ICD-10-CM | POA: Diagnosis not present

## 2021-01-10 NOTE — Patient Instructions (Signed)
Medication Instructions:  Your physician recommends that you continue on your current medications as directed. Please refer to the Current Medication list given to you today.  *If you need a refill on your cardiac medications before your next appointment, please call your pharmacy*   Lab Work: None  If you have labs (blood work) drawn today and your tests are completely normal, you will receive your results only by: [place] (if you have MyChart) OR A paper copy in the mail If you have any lab test that is abnormal or we need to change your treatment, we will call you to review the results.   Testing/Procedures: None   Follow-Up: At [facility], you and your health needs are our priority.  As part of our continuing mission to provide you with exceptional heart care, we have created designated Provider Care Teams.  These Care Teams include your primary Cardiologist (physician) and Advanced Practice Providers (APPs -  Physician Assistants and Nurse Practitioners) who all work together to provide you with the care you need, when you need it.  We recommend signing up for the patient portal called "MyChart".  Sign up information is provided on this After Visit Summary.  MyChart is used to connect with patients for Virtual Visits (Telemedicine).  Patients are able to view lab/test results, encounter notes, upcoming appointments, etc.  Non-urgent messages can be sent to your provider as well.   To learn more about what you can do with MyChart, go to [email or web address].    Your next appointment:   1 year(s)  The format for your next appointment:   In Person  Provider:   Sinclair Grooms, MD

## 2021-02-01 DIAGNOSIS — F3341 Major depressive disorder, recurrent, in partial remission: Secondary | ICD-10-CM | POA: Diagnosis not present

## 2021-02-11 DIAGNOSIS — D225 Melanocytic nevi of trunk: Secondary | ICD-10-CM | POA: Diagnosis not present

## 2021-02-11 DIAGNOSIS — Z85828 Personal history of other malignant neoplasm of skin: Secondary | ICD-10-CM | POA: Diagnosis not present

## 2021-02-11 DIAGNOSIS — L819 Disorder of pigmentation, unspecified: Secondary | ICD-10-CM | POA: Diagnosis not present

## 2021-02-11 DIAGNOSIS — L821 Other seborrheic keratosis: Secondary | ICD-10-CM | POA: Diagnosis not present

## 2021-02-11 DIAGNOSIS — D1801 Hemangioma of skin and subcutaneous tissue: Secondary | ICD-10-CM | POA: Diagnosis not present

## 2021-02-11 DIAGNOSIS — L814 Other melanin hyperpigmentation: Secondary | ICD-10-CM | POA: Diagnosis not present

## 2021-02-11 DIAGNOSIS — L720 Epidermal cyst: Secondary | ICD-10-CM | POA: Diagnosis not present

## 2021-02-11 DIAGNOSIS — D485 Neoplasm of uncertain behavior of skin: Secondary | ICD-10-CM | POA: Diagnosis not present

## 2021-02-11 DIAGNOSIS — L570 Actinic keratosis: Secondary | ICD-10-CM | POA: Diagnosis not present

## 2021-02-11 DIAGNOSIS — D0471 Carcinoma in situ of skin of right lower limb, including hip: Secondary | ICD-10-CM | POA: Diagnosis not present

## 2021-02-17 DIAGNOSIS — Z85828 Personal history of other malignant neoplasm of skin: Secondary | ICD-10-CM | POA: Diagnosis not present

## 2021-02-17 DIAGNOSIS — D0471 Carcinoma in situ of skin of right lower limb, including hip: Secondary | ICD-10-CM | POA: Diagnosis not present

## 2021-03-10 DIAGNOSIS — I480 Paroxysmal atrial fibrillation: Secondary | ICD-10-CM | POA: Diagnosis not present

## 2021-03-10 DIAGNOSIS — M179 Osteoarthritis of knee, unspecified: Secondary | ICD-10-CM | POA: Diagnosis not present

## 2021-03-10 DIAGNOSIS — I10 Essential (primary) hypertension: Secondary | ICD-10-CM | POA: Diagnosis not present

## 2021-03-10 DIAGNOSIS — J449 Chronic obstructive pulmonary disease, unspecified: Secondary | ICD-10-CM | POA: Diagnosis not present

## 2021-03-10 DIAGNOSIS — I5032 Chronic diastolic (congestive) heart failure: Secondary | ICD-10-CM | POA: Diagnosis not present

## 2021-03-10 DIAGNOSIS — E782 Mixed hyperlipidemia: Secondary | ICD-10-CM | POA: Diagnosis not present

## 2021-04-19 DIAGNOSIS — I10 Essential (primary) hypertension: Secondary | ICD-10-CM | POA: Diagnosis not present

## 2021-04-19 DIAGNOSIS — I509 Heart failure, unspecified: Secondary | ICD-10-CM | POA: Diagnosis not present

## 2021-04-19 DIAGNOSIS — I480 Paroxysmal atrial fibrillation: Secondary | ICD-10-CM | POA: Diagnosis not present

## 2021-04-19 DIAGNOSIS — G4733 Obstructive sleep apnea (adult) (pediatric): Secondary | ICD-10-CM | POA: Diagnosis not present

## 2021-04-19 DIAGNOSIS — J449 Chronic obstructive pulmonary disease, unspecified: Secondary | ICD-10-CM | POA: Diagnosis not present

## 2021-04-19 DIAGNOSIS — F325 Major depressive disorder, single episode, in full remission: Secondary | ICD-10-CM | POA: Diagnosis not present

## 2021-04-19 DIAGNOSIS — E782 Mixed hyperlipidemia: Secondary | ICD-10-CM | POA: Diagnosis not present

## 2021-04-19 DIAGNOSIS — G629 Polyneuropathy, unspecified: Secondary | ICD-10-CM | POA: Diagnosis not present

## 2021-04-19 DIAGNOSIS — M8580 Other specified disorders of bone density and structure, unspecified site: Secondary | ICD-10-CM | POA: Diagnosis not present

## 2021-04-27 DIAGNOSIS — Z20822 Contact with and (suspected) exposure to covid-19: Secondary | ICD-10-CM | POA: Diagnosis not present

## 2021-05-05 DIAGNOSIS — Z20822 Contact with and (suspected) exposure to covid-19: Secondary | ICD-10-CM | POA: Diagnosis not present

## 2021-05-24 ENCOUNTER — Ambulatory Visit (INDEPENDENT_AMBULATORY_CARE_PROVIDER_SITE_OTHER): Payer: Medicare Other | Admitting: Emergency Medicine

## 2021-05-24 ENCOUNTER — Encounter: Payer: Self-pay | Admitting: Emergency Medicine

## 2021-05-24 ENCOUNTER — Ambulatory Visit (INDEPENDENT_AMBULATORY_CARE_PROVIDER_SITE_OTHER): Payer: Medicare Other

## 2021-05-24 ENCOUNTER — Other Ambulatory Visit: Payer: Self-pay

## 2021-05-24 VITALS — BP 122/70 | HR 60 | Temp 98.3°F | Ht 66.0 in | Wt 194.4 lb

## 2021-05-24 DIAGNOSIS — J449 Chronic obstructive pulmonary disease, unspecified: Secondary | ICD-10-CM | POA: Diagnosis not present

## 2021-05-24 DIAGNOSIS — E782 Mixed hyperlipidemia: Secondary | ICD-10-CM | POA: Diagnosis not present

## 2021-05-24 DIAGNOSIS — R0609 Other forms of dyspnea: Secondary | ICD-10-CM

## 2021-05-24 DIAGNOSIS — J849 Interstitial pulmonary disease, unspecified: Secondary | ICD-10-CM

## 2021-05-24 DIAGNOSIS — I509 Heart failure, unspecified: Secondary | ICD-10-CM | POA: Diagnosis not present

## 2021-05-24 DIAGNOSIS — I10 Essential (primary) hypertension: Secondary | ICD-10-CM | POA: Diagnosis not present

## 2021-05-24 DIAGNOSIS — J439 Emphysema, unspecified: Secondary | ICD-10-CM | POA: Diagnosis not present

## 2021-05-24 MED ORDER — ALBUTEROL SULFATE HFA 108 (90 BASE) MCG/ACT IN AERS: 2.0000 | INHALATION_SPRAY | RESPIRATORY_TRACT | 3 refills | Status: AC | PRN

## 2021-05-24 NOTE — Assessment & Plan Note (Signed)
Progressive shortness of breath.  She has difficulty with the exact history but clearly over the last year her breathing has been more bothersome.  She has been started on Breztri, has only been able to take 1 puff twice daily due to side effects and palpitations.  She was also started on exertional oxygen although it does not sound like she has any portable tanks.  She has gained about 20 pounds, could be a component of deconditioning.  Her CT-PA from 11/23/2020 did not show a PE, did not show any significant infiltrates or recurrence of interstitial disease (from her prior amiodarone).  It did show emphysematous change. ? ?We will perform a walking oximetry today, qualify her for POC if possible.  Encouraged her to try to stay on the Breztri, increase to 2 puffs twice a day if possible.  We will repeat a chest x-ray today to compare with priors, ensure no evolving infiltrates.  We will repeat a pulmonary function testing at her next office visit quantify her degree of obstruction. ? ? ?We will perform walking oximetry today to see if you qualify for portable oxygen concentrator.  If so we will obtain this for you. ?Try to increase your Breztri to 2 puffs twice a day.  Rinse and gargle after using.  If you have side effects or palpitations you can go back to 1 puff twice a day. ?We will give you an albuterol inhaler to use when you have symptoms.  You can use 2 puffs up to every 4 hours if you have shortness of breath, chest tightness, wheezing. ?Chest x-ray today to evaluate your shortness of breath ?We will perform pulmonary function testing at your next office visit ?Follow with Dr. Lamonte Sakai next available with full pulmonary function testing on the same day.  ? ?

## 2021-05-24 NOTE — Patient Instructions (Signed)
We will perform walking oximetry today to see if you qualify for portable oxygen concentrator.  If so we will obtain this for you. ?Try to increase your Breztri to 2 puffs twice a day.  Rinse and gargle after using.  If you have side effects or palpitations you can go back to 1 puff twice a day. ?We will give you an albuterol inhaler to use when you have symptoms.  You can use 2 puffs up to every 4 hours if you have shortness of breath, chest tightness, wheezing. ?Chest x-ray today to evaluate your shortness of breath ?We will perform pulmonary function testing at your next office visit ?Follow with Dr. Lamonte Sakai next available with full pulmonary function testing on the same day.  ? ?

## 2021-05-24 NOTE — Progress Notes (Signed)
? ?History of Present Illness ? ?ROV 05/24/21 --81 year old woman with a history of former tobacco and mild COPD, pulmonary embolism (now off anticoagulation), atrial fibrillation with a Watchman, OSA on CPAP.  We have also followed scattered groundglass infiltrates on imaging while she was previously on amiodarone.  ?She was started on Breztri about 2-3 months ago - does feel that it helps her overall breathing. ? Whether she is taking reliably. She using 1 puff bid. She is having trouble walking without dyspnea. Trouble getting through grocery store.  ?She has has some chest pressure. SOB with speaking. No cough. Occasionally hears wheeze.  ?Today she reports that she was started on oxygen by Dr Inda Merlin about 1 years ago, to use at night w her CPAP and now apparently has been asked to use 3-4L/min about 3-4 months ago. Does not have a light portable system so she does not use w exertion. She has gained 20 lbs. Has not had COVID.  ? ? ?CTPA 11/23/2020 reviewed by me shows no evidence of pulmonary embolism, no mediastinal or hilar adenopathy, moderate centrilobular emphysema without any significant infiltrates ? ?CPAP compliance >> 93% usage > 4h  ? ? ?Review Of Systems: ?As per HPI ? ?Vital Signs ?BP 122/70 (BP Location: Right Arm, Patient Position: Sitting, Cuff Size: Normal)   Pulse 60   Temp 98.3 ?F (36.8 ?C) (Oral)   Ht '5\' 6"'$  (1.676 m)   Wt 194 lb 6.4 oz (88.2 kg)   SpO2 91%   BMI 31.38 kg/m?   ?Vitals:  ? 05/24/21 0903  ?BP: 122/70  ?Pulse: 60  ?Temp: 98.3 ?F (36.8 ?C)  ?TempSrc: Oral  ?SpO2: 91%  ?Weight: 194 lb 6.4 oz (88.2 kg)  ?Height: '5\' 6"'$  (1.676 m)  ? ? ?Physical Exam: ?Gen: Pleasant, well-nourished, in no distress,  normal affect ? ?ENT: No lesions,  mouth clear,  oropharynx clear, no postnasal drip ? ?Neck: No JVD, no stridor ? ?Lungs: No use of accessory muscles, no crackles or wheezing on normal respiration, scattered expiratory wheeze ? ?Cardiovascular: RRR, heart sounds normal, no murmur or  gallops, no peripheral edema ? ?Musculoskeletal: No deformities, no cyanosis or clubbing ? ?Neuro: alert, awake, a bit disjointed.  Poor history giver with poor memory ? ?Skin: Warm, no lesions or rashes ? ? ? ?Assessment/Plan ? ?Dyspnea on exertion ?Progressive shortness of breath.  She has difficulty with the exact history but clearly over the last year her breathing has been more bothersome.  She has been started on Breztri, has only been able to take 1 puff twice daily due to side effects and palpitations.  She was also started on exertional oxygen although it does not sound like she has any portable tanks.  She has gained about 20 pounds, could be a component of deconditioning.  Her CT-PA from 11/23/2020 did not show a PE, did not show any significant infiltrates or recurrence of interstitial disease (from her prior amiodarone).  It did show emphysematous change. ? ?We will perform a walking oximetry today, qualify her for POC if possible.  Encouraged her to try to stay on the Breztri, increase to 2 puffs twice a day if possible.  We will repeat a chest x-ray today to compare with priors, ensure no evolving infiltrates.  We will repeat a pulmonary function testing at her next office visit quantify her degree of obstruction. ? ? ?We will perform walking oximetry today to see if you qualify for portable oxygen concentrator.  If so we will obtain  this for you. ?Try to increase your Breztri to 2 puffs twice a day.  Rinse and gargle after using.  If you have side effects or palpitations you can go back to 1 puff twice a day. ?We will give you an albuterol inhaler to use when you have symptoms.  You can use 2 puffs up to every 4 hours if you have shortness of breath, chest tightness, wheezing. ?Chest x-ray today to evaluate your shortness of breath ?We will perform pulmonary function testing at your next office visit ?Follow with Dr. Lamonte Sakai next available with full pulmonary function testing on the same day.   ? ? ? ?Baltazar Apo, MD, PhD ?05/24/2021, 4:45 PM ?[facility] ?[phone] or if no answer [phone] ? ? ? ? ? ? ? ? ?

## 2021-05-30 ENCOUNTER — Other Ambulatory Visit: Payer: Self-pay | Admitting: Interventional Cardiology

## 2021-05-31 ENCOUNTER — Telehealth: Payer: Self-pay | Admitting: Emergency Medicine

## 2021-05-31 DIAGNOSIS — J449 Chronic obstructive pulmonary disease, unspecified: Secondary | ICD-10-CM

## 2021-05-31 NOTE — Telephone Encounter (Signed)
Called and spoke with patient and she states that per [employer] that she needs an order for a back up battery for her current POC that she currently has. Placed new order for back up battery and sending it to [employer]. No questions noted from patient. Nothing further needed  ?

## 2021-06-06 DIAGNOSIS — Z20822 Contact with and (suspected) exposure to covid-19: Secondary | ICD-10-CM | POA: Diagnosis not present

## 2021-06-16 DIAGNOSIS — Z20822 Contact with and (suspected) exposure to covid-19: Secondary | ICD-10-CM | POA: Diagnosis not present

## 2021-06-28 DIAGNOSIS — Z20822 Contact with and (suspected) exposure to covid-19: Secondary | ICD-10-CM | POA: Diagnosis not present

## 2021-07-04 DIAGNOSIS — H61899 Other specified disorders of external ear, unspecified ear: Secondary | ICD-10-CM | POA: Diagnosis not present

## 2021-07-04 DIAGNOSIS — F3341 Major depressive disorder, recurrent, in partial remission: Secondary | ICD-10-CM | POA: Diagnosis not present

## 2021-07-14 ENCOUNTER — Ambulatory Visit: Payer: Medicare Other | Admitting: Emergency Medicine

## 2021-07-14 ENCOUNTER — Ambulatory Visit (INDEPENDENT_AMBULATORY_CARE_PROVIDER_SITE_OTHER): Payer: Medicare Other | Admitting: Emergency Medicine

## 2021-07-14 ENCOUNTER — Encounter: Payer: Self-pay | Admitting: Emergency Medicine

## 2021-07-14 DIAGNOSIS — J849 Interstitial pulmonary disease, unspecified: Secondary | ICD-10-CM

## 2021-07-14 DIAGNOSIS — J449 Chronic obstructive pulmonary disease, unspecified: Secondary | ICD-10-CM | POA: Diagnosis not present

## 2021-07-14 DIAGNOSIS — J9611 Chronic respiratory failure with hypoxia: Secondary | ICD-10-CM | POA: Diagnosis not present

## 2021-07-14 DIAGNOSIS — R0609 Other forms of dyspnea: Secondary | ICD-10-CM

## 2021-07-14 LAB — PULMONARY FUNCTION TEST
DL/VA % pred: 105 %
DL/VA: 4.21 ml/min/mmHg/L
DLCO cor % pred: 84 %
DLCO cor: 16.74 ml/min/mmHg
DLCO unc % pred: 84 %
DLCO unc: 16.74 ml/min/mmHg
FEF 25-75 Post: 1.26 L/sec
FEF 25-75 Pre: 1.02 L/sec
FEF2575-%Change-Post: 23 %
FEF2575-%Pred-Post: 92 %
FEF2575-%Pred-Pre: 74 %
FEV1-%Change-Post: 4 %
FEV1-%Pred-Post: 68 %
FEV1-%Pred-Pre: 65 %
FEV1-Post: 1.39 L
FEV1-Pre: 1.33 L
FEV1FVC-%Change-Post: 0 %
FEV1FVC-%Pred-Pre: 103 %
FEV6-%Change-Post: 4 %
FEV6-%Pred-Post: 70 %
FEV6-%Pred-Pre: 67 %
FEV6-Post: 1.82 L
FEV6-Pre: 1.74 L
FEV6FVC-%Pred-Post: 105 %
FEV6FVC-%Pred-Pre: 105 %
FVC-%Change-Post: 4 %
FVC-%Pred-Post: 66 %
FVC-%Pred-Pre: 63 %
FVC-Post: 1.82 L
FVC-Pre: 1.74 L
Post FEV1/FVC ratio: 76 %
Post FEV6/FVC ratio: 100 %
Pre FEV1/FVC ratio: 76 %
Pre FEV6/FVC Ratio: 100 %
RV % pred: 81 %
RV: 2.07 L
TLC % pred: 81 %
TLC: 4.38 L

## 2021-07-14 NOTE — Patient Instructions (Signed)
We reviewed your pulmonary function testing today. Please take Breztri 2 puffs twice a day on a schedule every day.  This is a maintenance medication that you take regularly at the same time.  Rinse and gargle after using. Keep your albuterol available so you can use 2 puffs if you need it for any breathing symptoms such as shortness of breath, chest tightness, wheezing.  You can use it up to every 4 hours depending on your symptoms but you do not have to take it.  This is a symptom triggered medication for you to use only when needed. Continue your oxygen at 2 L/min with exertion.  You can uptitrate this if you need to in order to keep your oxygen saturations > 90%. Follow with APP in 6 weeks to review your status on this inhaler regimen Follow with Dr Lamonte Sakai in 6 months or sooner if you have any problems

## 2021-07-14 NOTE — Assessment & Plan Note (Signed)
Mixed obstruction and restriction on PFT.  We talked last time about getting her on scheduled BD therapy reliably.  She had been taking Breztri inconsistently due to palpitations.  We were going to restart this but she has a lot of confusion about which medication to use and the medication roles.  Spent time discussing maintenance medication versus as needed medication.   We reviewed your pulmonary function testing today. Please take Breztri 2 puffs twice a day on a schedule every day.  This is a maintenance medication that you take regularly at the same time.  Rinse and gargle after using. Keep your albuterol available so you can use 2 puffs if you need it for any breathing symptoms such as shortness of breath, chest tightness, wheezing.  You can use it up to every 4 hours depending on your symptoms but you do not have to take it.  This is a symptom triggered medication for you to use only when needed. Follow with APP in 6 weeks to review your status on this inhaler regimen Follow with Dr Lamonte Sakai in 6 months or sooner if you have any problems

## 2021-07-14 NOTE — Assessment & Plan Note (Signed)
Documented on ambulatory oximetry.  She was titrated to 2 L/min with exertion.  We discussed today the goal to keep her saturations at least 90%.  Instructed her to uptitrate if she is significantly exerts and sees desaturations

## 2021-07-14 NOTE — Progress Notes (Signed)
History of Present Illness  ROV 07/14/21 --follow-up visit for 81 year old woman with a history of COPD, prior PE (off anticoagulation), atrial fibrillation/watchman, OSA on CPAP, scattered groundglass pulmonary infiltrates that were identified when she was on amiodarone (cleared on subsequent imaging).  She had a walking oximetry at her last visit and was titrated to 2 L/min pulsed oxygen with exertion.  She was started on Breztri but difficulty tolerating due to palpitations.  I tried ordering her a Marine scientist.  She underwent pulmonary function testing today as below.  She is very confused about her meds. She does not understands maintenance vs prn.    Pulmonary function testing reviewed by me shows mixed obstruction and restriction with an FEV1 of 1.33 L (65% predicted) without a bronchodilator response.  Her lung volumes are normal (question pseudonormalization), diffusion capacity is normal.   Review Of Systems: As per HPI  Vital Signs BP 122/76 (BP Location: Right Arm, Patient Position: Sitting, Cuff Size: Normal)   Pulse (!) 52   Temp 98.3 F (36.8 C) (Oral)   Ht '5\' 6"'$  (1.676 m)   Wt 194 lb (88 kg)   SpO2 93%   BMI 31.31 kg/m   Vitals:   07/14/21 1211  BP: 122/76  Pulse: (!) 52  Temp: 98.3 F (36.8 C)  TempSrc: Oral  SpO2: 93%  Weight: 194 lb (88 kg)  Height: '5\' 6"'$  (1.676 m)    Physical Exam: Gen: Pleasant, well-nourished, in no distress,  normal affect  ENT: No lesions,  mouth clear,  oropharynx clear, no postnasal drip  Neck: No JVD, no stridor  Lungs: No use of accessory muscles, no crackles or wheezing on normal respiration, scattered expiratory wheeze  Cardiovascular: RRR, heart sounds normal, no murmur or gallops, no peripheral edema  Musculoskeletal: No deformities, no cyanosis or clubbing  Neuro: alert, awake, a bit disjointed.  Poor history giver with poor memory  Skin: Warm, no lesions or rashes    Assessment/Plan  COPD Mixed  obstruction and restriction on PFT.  We talked last time about getting her on scheduled BD therapy reliably.  She had been taking Breztri inconsistently due to palpitations.  We were going to restart this but she has a lot of confusion about which medication to use and the medication roles.  Spent time discussing maintenance medication versus as needed medication.   We reviewed your pulmonary function testing today. Please take Breztri 2 puffs twice a day on a schedule every day.  This is a maintenance medication that you take regularly at the same time.  Rinse and gargle after using. Keep your albuterol available so you can use 2 puffs if you need it for any breathing symptoms such as shortness of breath, chest tightness, wheezing.  You can use it up to every 4 hours depending on your symptoms but you do not have to take it.  This is a symptom triggered medication for you to use only when needed. Follow with APP in 6 weeks to review your status on this inhaler regimen Follow with Dr Lamonte Sakai in 6 months or sooner if you have any problems  Chronic respiratory failure with hypoxia ([place]) Documented on ambulatory oximetry.  She was titrated to 2 L/min with exertion.  We discussed today the goal to keep her saturations at least 90%.  Instructed her to uptitrate if she is significantly exerts and sees desaturations    Baltazar Apo, MD, PhD 07/14/2021, 5:17 PM [facility] [phone] or if no  answer [phone]

## 2021-08-11 DIAGNOSIS — Z1231 Encounter for screening mammogram for malignant neoplasm of breast: Secondary | ICD-10-CM | POA: Diagnosis not present

## 2021-08-17 DIAGNOSIS — I509 Heart failure, unspecified: Secondary | ICD-10-CM | POA: Diagnosis not present

## 2021-08-17 DIAGNOSIS — M8580 Other specified disorders of bone density and structure, unspecified site: Secondary | ICD-10-CM | POA: Diagnosis not present

## 2021-08-17 DIAGNOSIS — G4733 Obstructive sleep apnea (adult) (pediatric): Secondary | ICD-10-CM | POA: Diagnosis not present

## 2021-08-17 DIAGNOSIS — G629 Polyneuropathy, unspecified: Secondary | ICD-10-CM | POA: Diagnosis not present

## 2021-08-17 DIAGNOSIS — I480 Paroxysmal atrial fibrillation: Secondary | ICD-10-CM | POA: Diagnosis not present

## 2021-08-17 DIAGNOSIS — J449 Chronic obstructive pulmonary disease, unspecified: Secondary | ICD-10-CM | POA: Diagnosis not present

## 2021-08-17 DIAGNOSIS — E782 Mixed hyperlipidemia: Secondary | ICD-10-CM | POA: Diagnosis not present

## 2021-08-17 DIAGNOSIS — I10 Essential (primary) hypertension: Secondary | ICD-10-CM | POA: Diagnosis not present

## 2021-08-17 DIAGNOSIS — F325 Major depressive disorder, single episode, in full remission: Secondary | ICD-10-CM | POA: Diagnosis not present

## 2021-08-22 DIAGNOSIS — I509 Heart failure, unspecified: Secondary | ICD-10-CM | POA: Diagnosis not present

## 2021-08-22 DIAGNOSIS — I10 Essential (primary) hypertension: Secondary | ICD-10-CM | POA: Diagnosis not present

## 2021-08-22 DIAGNOSIS — E782 Mixed hyperlipidemia: Secondary | ICD-10-CM | POA: Diagnosis not present

## 2021-08-22 DIAGNOSIS — J449 Chronic obstructive pulmonary disease, unspecified: Secondary | ICD-10-CM | POA: Diagnosis not present

## 2021-08-24 ENCOUNTER — Ambulatory Visit (INDEPENDENT_AMBULATORY_CARE_PROVIDER_SITE_OTHER): Payer: Medicare Other | Admitting: Primary Care

## 2021-08-24 ENCOUNTER — Encounter: Payer: Self-pay | Admitting: Primary Care

## 2021-08-24 VITALS — BP 114/64 | HR 65 | Temp 98.7°F | Ht 66.0 in | Wt 196.8 lb

## 2021-08-24 DIAGNOSIS — Z8679 Personal history of other diseases of the circulatory system: Secondary | ICD-10-CM | POA: Diagnosis not present

## 2021-08-24 DIAGNOSIS — J9611 Chronic respiratory failure with hypoxia: Secondary | ICD-10-CM

## 2021-08-24 DIAGNOSIS — J849 Interstitial pulmonary disease, unspecified: Secondary | ICD-10-CM

## 2021-08-24 DIAGNOSIS — I480 Paroxysmal atrial fibrillation: Secondary | ICD-10-CM

## 2021-08-24 DIAGNOSIS — J449 Chronic obstructive pulmonary disease, unspecified: Secondary | ICD-10-CM

## 2021-08-24 MED ORDER — BREZTRI AEROSPHERE 160-9-4.8 MCG/ACT IN AERO: 2.0000 | INHALATION_SPRAY | RESPIRATORY_TRACT | 0 refills | Status: DC | PRN

## 2021-08-24 NOTE — Addendum Note (Signed)
Addended by: Mathis Bud on: 08/24/2021 12:09 PM   Modules accepted: Orders

## 2021-08-24 NOTE — Assessment & Plan Note (Addendum)
-   O2 86% on room air with ambulation, she recovered to 94% with rest. She has POC with her today in office- advised she use 2L oxygen with moderate exertion

## 2021-08-24 NOTE — Assessment & Plan Note (Addendum)
-   EKG confirmed patient is current in sinus rhythm with occ PACs   - Continue Toprol-XL '25mg'$  and ASA '81mg'$

## 2021-08-24 NOTE — Progress Notes (Signed)
$'@Patient'Z$  ID: Judy White, female    DOB: 09-20-1940, 81 y.o.   MRN: [id number]  Chief Complaint  Patient presents with   Follow-up    Sob- better, occass. Wheezing, no cough    Referring provider: Josetta Huddle, MD  HPI: 81 year old female, former smoker quit in 2017 (30-pack-year history).  Past medical history significant for ILD, COPD, chronic respiratory failure with hypoxia, chronic diastolic heart failure, hypertension, paroxysmal A-fib, pulmonary embolism.  Patient of Dr. Lamonte Sakai, last seen on 07/14/2021.  Previous [facility] pulmonary encounter: 07/14/21- Dr. Lamonte Sakai ROV 07/14/21 --follow-up visit for 81 year old woman with a history of COPD, prior PE (off anticoagulation), atrial fibrillation/watchman, OSA on CPAP, scattered groundglass pulmonary infiltrates that were identified when she was on amiodarone (cleared on subsequent imaging).  She had a walking oximetry at her last visit and was titrated to 2 L/min pulsed oxygen with exertion.  She was started on Breztri but difficulty tolerating due to palpitations.  I tried ordering her a Marine scientist.  She underwent pulmonary function testing today as below.  She is very confused about her meds. She does not understands maintenance vs prn.    Pulmonary function testing reviewed by me shows mixed obstruction and restriction with an FEV1 of 1.33 L (65% predicted) without a bronchodilator response.  Her lung volumes are normal (question pseudonormalization), diffusion capacity is normal.    08/24/2021- Interim hx  Patient presents today for 6-week follow-up.  During last visit in haler regiment was reviewed at length, encouraged to use  Breztri inhaler regularly twice daily.   She is using her inhalers correctly. No issues. She is taking Breztri in morning and evening. She has not needed to use Albuterol.Her shortness of breath is better. She has occasional wheezing. No significant cough. She wears CPAP every night. She was not wearing oxygen  today but brought it with her. She is newly on Repatha. Feels her heart rate has been irregular, she has a hx afib. CAT 13   Allergies  Allergen Reactions   Bupropion     Other reaction(s): anxiety   Rosuvastatin     Myalgias on '5mg'$  every other day   Statins     Immunization History  Administered Date(s) Administered   Influenza Split 12/01/2008, 11/05/2009, 12/19/2011, 12/12/2012, 11/27/2013, 11/18/2014, 11/12/2015   Influenza, High Dose Seasonal PF 11/19/2015, 11/24/2016, 10/30/2018, 01/15/2020, 12/02/2020   Influenza,inj,Quad PF,6+ Mos 11/24/2010   Influenza-Unspecified 12/21/2017   Moderna Sars-Covid-2 Vaccination 12/14/2020   PFIZER(Purple Top)SARS-COV-2 Vaccination 03/10/2019, 03/30/2019, 12/13/2019   Pneumococcal Conjugate-13 06/23/2013   Pneumococcal Polysaccharide-23 05/28/2002, 12/03/2015   Td 05/28/2002, 05/15/2009   Zoster Recombinat (Shingrix) 07/10/2017, 09/10/2017   Zoster, Live 05/28/2007, 09/11/2017, 12/11/2017    Past Medical History:  Diagnosis Date   Agoraphobia    Anxiety    Arthritis    "normal for the age; nothing gives me problems" (01/24/2016)   COPD (chronic obstructive pulmonary disease) ([place])    "mild" (01/24/2016)   Depressive disorder, not elsewhere classified    DVT (deep venous thrombosis) ([place]) 10/08/2015   "they said the blood clot went from one of my legs to my lung"   History of hiatal hernia    "small one" (01/24/2016)   Hypercholesteremia    Hypertension    pt denies this hx on 01/24/2016   Myalgia and myositis, unspecified    Osteopenia    Overactive bladder    Persistent atrial fibrillation ([place])    a. s/p Watchman implant 12/2014   Presence of Watchman left  atrial appendage closure device    Pulmonary embolism ([place]) 10/08/2015   Sleep apnea    USES CPAP     Tobacco History: Social History   Tobacco Use  Smoking Status Former   Packs/day: 0.50   Years: 60.00   Total pack years: 30.00   Types: Cigarettes   Quit  date: 10/08/2015   Years since quitting: 5.8  Smokeless Tobacco Never   Counseling given: Not Answered   Outpatient Medications Prior to Visit  Medication Sig Dispense Refill   acetaminophen (TYLENOL) 500 MG tablet Take 500 mg by mouth every 6 (six) hours as needed for moderate pain or headache.     albuterol (VENTOLIN HFA) 108 (90 Base) MCG/ACT inhaler Inhale 2 puffs into the lungs every 4 (four) hours as needed for wheezing or shortness of breath. 8 g 3   aspirin EC 81 MG tablet Take 81 mg by mouth daily.     Budeson-Glycopyrrol-Formoterol (BREZTRI AEROSPHERE) 160-9-4.8 MCG/ACT AERO as needed.     Calcium-Vitamin D (CALTRATE 600 PLUS-VIT D PO) Take 1 tablet by mouth daily.      Cholecalciferol (VITAMIN D3) 2000 units TABS Take 2,000 Units by mouth daily.     clonazePAM (KLONOPIN) 1 MG tablet Take 1.5-2 mg by mouth daily.      Evolocumab (REPATHA SURECLICK) 536 MG/ML SOAJ Every 2 wks.     FLUoxetine (PROZAC) 20 MG capsule Take 20 mg by mouth daily.      furosemide (LASIX) 20 MG tablet Take 1 tablet (20 mg total) by mouth daily. 90 tablet 3   gabapentin (NEURONTIN) 100 MG capsule Take 100 mg by mouth 3 (three) times daily.     metoprolol succinate (TOPROL-XL) 25 MG 24 hr tablet Take 12.5 mg by mouth every morning.     oxybutynin (DITROPAN-XL) 10 MG 24 hr tablet Take 10 mg by mouth daily.      Potassium 99 MG TABS Take 99 mg by mouth daily.     vitamin C (ASCORBIC ACID) 500 MG tablet Take 500 mg by mouth daily.     vitamin E 180 MG (400 UNITS) capsule Take 400 Units daily by mouth.     No facility-administered medications prior to visit.    Review of Systems  Review of Systems  Constitutional: Negative.   HENT: Negative.    Respiratory:  Negative for cough, chest tightness and shortness of breath.        Dyspnea-improved; Occ wheezing  Cardiovascular: Negative.  Negative for leg swelling.   Physical Exam  BP 114/64 (BP Location: Left Arm, Cuff Size: Normal)   Pulse 65   Temp  98.7 F (37.1 C) (Temporal)   Ht '5\' 6"'$  (1.676 m)   Wt 196 lb 12.8 oz (89.3 kg)   SpO2 94%   BMI 31.76 kg/m  Physical Exam Constitutional:      Appearance: Normal appearance.  HENT:     Head: Normocephalic and atraumatic.     Mouth/Throat:     Mouth: Mucous membranes are moist.     Pharynx: Oropharynx is clear.  Cardiovascular:     Rate and Rhythm: Normal rate and regular rhythm.     Comments: Regular to auscultation  Pulmonary:     Effort: Pulmonary effort is normal.     Breath sounds: Rales present. No wheezing or rhonchi.     Comments: Scattered crackles to bilateral lower lobes Musculoskeletal:        General: Normal range of motion.  Skin:    General:  Skin is warm and dry.  Neurological:     General: No focal deficit present.     Mental Status: She is alert and oriented to person, place, and time. Mental status is at baseline.  Psychiatric:        Mood and Affect: Mood normal.        Behavior: Behavior normal.        Thought Content: Thought content normal.        Judgment: Judgment normal.      Lab Results:  CBC    Component Value Date/Time   WBC 18.1 (H) 12/28/2018 0315   RBC 4.19 12/28/2018 0315   HGB 12.3 12/28/2018 0315   HCT 38.9 12/28/2018 0315   PLT 205 12/28/2018 0315   MCV 92.8 12/28/2018 0315   MCH 29.4 12/28/2018 0315   MCHC 31.6 12/28/2018 0315   RDW 13.1 12/28/2018 0315   LYMPHSABS 1.7 12/02/2015 1611   MONOABS 0.9 12/02/2015 1611   EOSABS 0.3 12/02/2015 1611   BASOSABS 0.1 12/02/2015 1611    BMET    Component Value Date/Time   NA 138 12/28/2018 0315   NA 143 03/09/2017 1247   K 4.8 12/28/2018 0315   CL 102 12/28/2018 0315   CO2 27 12/28/2018 0315   GLUCOSE 162 (H) 12/28/2018 0315   BUN 19 12/28/2018 0315   BUN 23 03/09/2017 1247   CREATININE 0.79 12/28/2018 0315   CREATININE 0.94 (H) 10/26/2015 0929   CALCIUM 8.3 (L) 12/28/2018 0315   GFRNONAA >60 12/28/2018 0315   GFRNONAA 59 (L) 10/26/2015 0929   GFRAA >60 12/28/2018 0315    GFRAA 68 10/26/2015 0929    BNP    Component Value Date/Time   BNP 375.6 (H) 12/02/2015 1611    ProBNP    Component Value Date/Time   PROBNP 136.0 (H) 08/25/2014 1518    Imaging: No results found.   Assessment & Plan:   COPD - Stable; Patient is using inhalers correctly. Dyspnea has improved. She rarely requires SABA. CAT 13. Continue Breztri Aerosphere two puffs morning and evening; Albuterol HFA 2 puffs every 4-6 hours as needed for breakthrough sob/wheezing.   Interstitial lung disease ([place]) - Improved areas of GGO on previous imaging.  - No progressive symptoms  Paroxysmal atrial fibrillation ([facility]) - EKG confirmed patient is current in sinus rhythm with occ PACs   - Continue Toprol-XL '25mg'$  and ASA '81mg'$    Chronic respiratory failure with hypoxia ([facility]) - O2 86% on room air with ambulation, she recovered to 94% with rest. She has POC with her today in office- advised she use 2L oxygen with moderate exertion      Martyn Ehrich, NP 08/24/2021

## 2021-08-24 NOTE — Patient Instructions (Addendum)
EKG showed that you are in a normal sinus rhythm with occasional premature atrial contractions.  You are not currently in atrial fibrillation.   Recommendations - Continue Breztri 2 puffs every morning and evening - Use albuterol rescue inhaler 2 puffs every 4-6 hours for breakthrough shortness of breath/wheezing - Continue to wear CPAP every night for minimum of 4 to 6 hours or longer  Follow-up - Needs visit in 4-5 month with Dr. Lamonte Sakai (please put in recall)

## 2021-08-24 NOTE — Assessment & Plan Note (Signed)
-   Improved areas of GGO on previous imaging.  - No progressive symptoms

## 2021-08-24 NOTE — Assessment & Plan Note (Addendum)
-   Stable; Patient is using inhalers correctly. Dyspnea has improved. She rarely requires SABA. CAT 13. Continue Breztri Aerosphere two puffs morning and evening; Albuterol HFA 2 puffs every 4-6 hours as needed for breakthrough sob/wheezing.

## 2021-10-13 DIAGNOSIS — R32 Unspecified urinary incontinence: Secondary | ICD-10-CM | POA: Diagnosis not present

## 2021-10-13 DIAGNOSIS — J8410 Pulmonary fibrosis, unspecified: Secondary | ICD-10-CM | POA: Diagnosis not present

## 2021-10-13 DIAGNOSIS — E782 Mixed hyperlipidemia: Secondary | ICD-10-CM | POA: Diagnosis not present

## 2021-10-13 DIAGNOSIS — R238 Other skin changes: Secondary | ICD-10-CM | POA: Diagnosis not present

## 2021-10-13 DIAGNOSIS — R3915 Urgency of urination: Secondary | ICD-10-CM | POA: Diagnosis not present

## 2021-10-13 DIAGNOSIS — J449 Chronic obstructive pulmonary disease, unspecified: Secondary | ICD-10-CM | POA: Diagnosis not present

## 2021-11-04 DIAGNOSIS — I509 Heart failure, unspecified: Secondary | ICD-10-CM | POA: Diagnosis not present

## 2021-11-04 DIAGNOSIS — Z23 Encounter for immunization: Secondary | ICD-10-CM | POA: Diagnosis not present

## 2021-11-04 DIAGNOSIS — M25562 Pain in left knee: Secondary | ICD-10-CM | POA: Diagnosis not present

## 2021-11-04 DIAGNOSIS — F325 Major depressive disorder, single episode, in full remission: Secondary | ICD-10-CM | POA: Diagnosis not present

## 2021-11-04 DIAGNOSIS — R233 Spontaneous ecchymoses: Secondary | ICD-10-CM | POA: Diagnosis not present

## 2021-11-04 DIAGNOSIS — N3281 Overactive bladder: Secondary | ICD-10-CM | POA: Diagnosis not present

## 2021-11-04 DIAGNOSIS — I480 Paroxysmal atrial fibrillation: Secondary | ICD-10-CM | POA: Diagnosis not present

## 2021-11-04 DIAGNOSIS — G4733 Obstructive sleep apnea (adult) (pediatric): Secondary | ICD-10-CM | POA: Diagnosis not present

## 2021-11-04 DIAGNOSIS — J449 Chronic obstructive pulmonary disease, unspecified: Secondary | ICD-10-CM | POA: Diagnosis not present

## 2021-11-04 DIAGNOSIS — G629 Polyneuropathy, unspecified: Secondary | ICD-10-CM | POA: Diagnosis not present

## 2021-11-04 DIAGNOSIS — Z0000 Encounter for general adult medical examination without abnormal findings: Secondary | ICD-10-CM | POA: Diagnosis not present

## 2021-11-04 DIAGNOSIS — M8580 Other specified disorders of bone density and structure, unspecified site: Secondary | ICD-10-CM | POA: Diagnosis not present

## 2021-11-04 DIAGNOSIS — E782 Mixed hyperlipidemia: Secondary | ICD-10-CM | POA: Diagnosis not present

## 2021-11-04 DIAGNOSIS — F4000 Agoraphobia, unspecified: Secondary | ICD-10-CM | POA: Diagnosis not present

## 2021-11-06 ENCOUNTER — Encounter: Payer: Self-pay | Admitting: Interventional Cardiology

## 2021-11-08 DIAGNOSIS — H52201 Unspecified astigmatism, right eye: Secondary | ICD-10-CM | POA: Diagnosis not present

## 2021-11-08 DIAGNOSIS — H353121 Nonexudative age-related macular degeneration, left eye, early dry stage: Secondary | ICD-10-CM | POA: Diagnosis not present

## 2021-11-08 DIAGNOSIS — Z961 Presence of intraocular lens: Secondary | ICD-10-CM | POA: Diagnosis not present

## 2021-11-16 DIAGNOSIS — I5032 Chronic diastolic (congestive) heart failure: Secondary | ICD-10-CM | POA: Diagnosis not present

## 2021-11-16 DIAGNOSIS — Z9981 Dependence on supplemental oxygen: Secondary | ICD-10-CM | POA: Diagnosis not present

## 2021-11-16 DIAGNOSIS — I480 Paroxysmal atrial fibrillation: Secondary | ICD-10-CM | POA: Diagnosis not present

## 2021-11-16 DIAGNOSIS — Z780 Asymptomatic menopausal state: Secondary | ICD-10-CM | POA: Diagnosis not present

## 2021-11-16 DIAGNOSIS — G4733 Obstructive sleep apnea (adult) (pediatric): Secondary | ICD-10-CM | POA: Diagnosis not present

## 2021-11-16 DIAGNOSIS — G4734 Idiopathic sleep related nonobstructive alveolar hypoventilation: Secondary | ICD-10-CM | POA: Diagnosis not present

## 2021-11-16 DIAGNOSIS — M85851 Other specified disorders of bone density and structure, right thigh: Secondary | ICD-10-CM | POA: Diagnosis not present

## 2021-11-16 DIAGNOSIS — M85852 Other specified disorders of bone density and structure, left thigh: Secondary | ICD-10-CM | POA: Diagnosis not present

## 2021-11-24 DIAGNOSIS — M6281 Muscle weakness (generalized): Secondary | ICD-10-CM | POA: Diagnosis not present

## 2021-11-24 DIAGNOSIS — M25562 Pain in left knee: Secondary | ICD-10-CM | POA: Diagnosis not present

## 2021-11-24 DIAGNOSIS — R2689 Other abnormalities of gait and mobility: Secondary | ICD-10-CM | POA: Diagnosis not present

## 2021-11-24 DIAGNOSIS — M25362 Other instability, left knee: Secondary | ICD-10-CM | POA: Diagnosis not present

## 2021-11-30 DIAGNOSIS — F3341 Major depressive disorder, recurrent, in partial remission: Secondary | ICD-10-CM | POA: Diagnosis not present

## 2021-12-07 DIAGNOSIS — M25362 Other instability, left knee: Secondary | ICD-10-CM | POA: Diagnosis not present

## 2021-12-07 DIAGNOSIS — M25562 Pain in left knee: Secondary | ICD-10-CM | POA: Diagnosis not present

## 2021-12-07 DIAGNOSIS — M6281 Muscle weakness (generalized): Secondary | ICD-10-CM | POA: Diagnosis not present

## 2021-12-07 DIAGNOSIS — R2689 Other abnormalities of gait and mobility: Secondary | ICD-10-CM | POA: Diagnosis not present

## 2021-12-22 DIAGNOSIS — M6281 Muscle weakness (generalized): Secondary | ICD-10-CM | POA: Diagnosis not present

## 2021-12-22 DIAGNOSIS — R2689 Other abnormalities of gait and mobility: Secondary | ICD-10-CM | POA: Diagnosis not present

## 2021-12-22 DIAGNOSIS — M25362 Other instability, left knee: Secondary | ICD-10-CM | POA: Diagnosis not present

## 2021-12-22 DIAGNOSIS — M25562 Pain in left knee: Secondary | ICD-10-CM | POA: Diagnosis not present

## 2021-12-27 DIAGNOSIS — M25362 Other instability, left knee: Secondary | ICD-10-CM | POA: Diagnosis not present

## 2021-12-27 DIAGNOSIS — R2689 Other abnormalities of gait and mobility: Secondary | ICD-10-CM | POA: Diagnosis not present

## 2021-12-27 DIAGNOSIS — M6281 Muscle weakness (generalized): Secondary | ICD-10-CM | POA: Diagnosis not present

## 2021-12-27 DIAGNOSIS — M25562 Pain in left knee: Secondary | ICD-10-CM | POA: Diagnosis not present

## 2021-12-29 DIAGNOSIS — M25562 Pain in left knee: Secondary | ICD-10-CM | POA: Diagnosis not present

## 2021-12-29 DIAGNOSIS — M25362 Other instability, left knee: Secondary | ICD-10-CM | POA: Diagnosis not present

## 2021-12-29 DIAGNOSIS — R2689 Other abnormalities of gait and mobility: Secondary | ICD-10-CM | POA: Diagnosis not present

## 2021-12-29 DIAGNOSIS — M6281 Muscle weakness (generalized): Secondary | ICD-10-CM | POA: Diagnosis not present

## 2021-12-31 NOTE — Progress Notes (Unsigned)
Cardiology Office Note:    Date:  01/02/2022   ID:  Judy White, DOB 1940-07-15, MRN [id number]  PCP:  Josetta Huddle, MD  Cardiologist:  Sinclair Grooms, MD   Referring MD: Josetta Huddle, MD   Chief Complaint  Patient presents with   Atrial Fibrillation    History of Present Illness:    Judy White is a 81 y.o. female with a hx of  paroxysmal atrial fibrillation with successful ablation, Watchman implantation 2016, amiodarone therapy discontinued after ablation, diastolic heart failure, unable to use anticoagulation due to intracranial hemorrhage, essential hypertension, sleep apnea with CPAP compliance, history of DVT with pulmonary emboli, and anxiety disorder.     She feels that her heart is skipping a beat from time to time.  She has a history of A-fib and was ablated by Dr. Omelia Blackwater at [facility].  She denies syncope.  She has not had any significant prolonged episodes of palpitation.  She is worried that she might need a repeat ablation.  She denies angina.  She has had no bleeding on aspirin 3 times per week.  She has had for cranial bleeding on DOAC therapy.  She had a Watchman implanted in 2016.  Was on amiodarone therapy but it was discontinued after ablation.  She is using CPAP and continuous oxygen by cannula although she does not have it on that day.  She states she left it in the car.    Past Medical History:  Diagnosis Date   Agoraphobia    Anxiety    Arthritis    "normal for the age; nothing gives me problems" (01/24/2016)   COPD (chronic obstructive pulmonary disease) ([place])    "mild" (01/24/2016)   Depressive disorder, not elsewhere classified    DVT (deep venous thrombosis) ([place]) 10/08/2015   "they said the blood clot went from one of my legs to my lung"   History of hiatal hernia    "small one" (01/24/2016)   Hypercholesteremia    Hypertension    pt denies this hx on 01/24/2016   Myalgia and myositis, unspecified    Osteopenia     Overactive bladder    Persistent atrial fibrillation ([place])    a. s/p Watchman implant 12/2014   Presence of Watchman left atrial appendage closure device    Pulmonary embolism ([place]) 10/08/2015   Sleep apnea    USES CPAP     Past Surgical History:  Procedure Laterality Date   APPENDECTOMY  1983   ATRIAL FIBRILLATION ABLATION  06/28/2017   PER PATIENT SUCCESSFUL ABLATION , [place] , [place]   BACK SURGERY     BRONCHOSCOPY  01/24/2016   CARDIAC CATHETERIZATION     >5 yrs   CATARACT EXTRACTION W/ INTRAOCULAR LENS  IMPLANT, BILATERAL Bilateral    CESAREAN SECTION  1967   LEFT ATRIAL APPENDAGE OCCLUSION N/A 12/31/2014   Procedure: LEFT ATRIAL APPENDAGE OCCLUSION;  Surgeon: Thompson Grayer, MD;  Location: [facility];  Service: Cardiovascular;  Laterality: N/A;   POSTERIOR FUSION LUMBAR SPINE Bilateral 07/2014   SHOULDER ARTHROSCOPY W/ ROTATOR CUFF REPAIR Left    SHOULDER SURGERY Right    "tendon broke; couldn't be repaired"   TEE WITHOUT CARDIOVERSION N/A 12/22/2014   Procedure: TRANSESOPHAGEAL ECHOCARDIOGRAM (TEE);  Surgeon: Pixie Casino, MD;  Location: [facility];  Service: Cardiovascular;  Laterality: N/A;   TEE WITHOUT CARDIOVERSION N/A 02/16/2015   post Watchman TEE with good seal and no  leak around device    TONSILLECTOMY     TOOTH EXTRACTION  05/2018   TOTAL ABDOMINAL HYSTERECTOMY     TOTAL KNEE ARTHROPLASTY Right 12/27/2018   Procedure: RIGHT TOTAL KNEE ARTHROPLASTY;  Surgeon: Mcarthur Rossetti, MD;  Location: [facility];  Service: Orthopedics;  Laterality: Right;   VIDEO BRONCHOSCOPY Bilateral 01/24/2016   Procedure: VIDEO BRONCHOSCOPY WITH FLUORO;  Surgeon: Collene Gobble, MD;  Location: [facility];  Service: Cardiopulmonary;  Laterality: Bilateral;   WISDOM TOOTH EXTRACTION      Current Medications: Current Meds  Medication Sig   acetaminophen (TYLENOL) 500 MG tablet Take 500 mg by mouth every 6 (six) hours as needed for  moderate pain or headache.   albuterol (VENTOLIN HFA) 108 (90 Base) MCG/ACT inhaler Inhale 2 puffs into the lungs every 4 (four) hours as needed for wheezing or shortness of breath.   aspirin EC 81 MG tablet Take 81 mg by mouth 3 (three) times a week. Swallow whole. Pt take 1 tablet 81 mg on Mondays, Wednesdays, and Fridays.   Budeson-Glycopyrrol-Formoterol (BREZTRI AEROSPHERE) 160-9-4.8 MCG/ACT AERO Inhale 2 puffs into the lungs as needed.   Calcium-Vitamin D (CALTRATE 600 PLUS-VIT D PO) Take 1 tablet by mouth daily.    Cholecalciferol (VITAMIN D3) 2000 units TABS Take 2,000 Units by mouth daily.   clonazePAM (KLONOPIN) 1 MG tablet Take 1.5-2 mg by mouth daily.    Evolocumab (REPATHA SURECLICK) 637 MG/ML SOAJ Every 2 wks.   FLUoxetine (PROZAC) 20 MG capsule Take 20 mg by mouth daily.    furosemide (LASIX) 20 MG tablet Take 1 tablet (20 mg total) by mouth daily.   gabapentin (NEURONTIN) 100 MG capsule Take 100 mg by mouth 3 (three) times daily.   metoprolol succinate (TOPROL-XL) 25 MG 24 hr tablet Take 12.5 mg by mouth every morning.   oxybutynin (DITROPAN-XL) 10 MG 24 hr tablet Take 10 mg by mouth daily.    Potassium 99 MG TABS Take 99 mg by mouth daily.   vitamin C (ASCORBIC ACID) 500 MG tablet Take 500 mg by mouth daily.   vitamin E 180 MG (400 UNITS) capsule Take 400 Units daily by mouth.     Allergies:   Bupropion, Rosuvastatin, and Statins   Social History   Socioeconomic History   Marital status: Widowed    Spouse name: Not on file   Number of children: Not on file   Years of education: Not on file   Highest education level: Not on file  Occupational History   Not on file  Tobacco Use   Smoking status: Former    Packs/day: 0.50    Years: 60.00    Total pack years: 30.00    Types: Cigarettes    Quit date: 10/08/2015    Years since quitting: 6.2   Smokeless tobacco: Never  Vaping Use   Vaping Use: Never used  Substance and Sexual Activity   Alcohol use: Yes     Alcohol/week: 0.0 standard drinks of alcohol    Comment: occ   Drug use: No   Sexual activity: Not on file  Other Topics Concern   Not on file  Social History Narrative   Not on file   Social Determinants of Health   Financial Resource Strain: Not on file  Food Insecurity: Not on file  Transportation Needs: Not on file  Physical Activity: Not on file  Stress: Not on file  Social Connections: Not on file     Family History: The patient's family  history includes Aneurysm in her father; Cancer in an other family member; Heart Problems in an other family member; Heart disease in her brother; Hypotension in her mother; Neuropathy in her brother. There is no history of Heart attack.  ROS:   Please see the history of present illness.    Appetite has been stable.  She is still mourning the loss of her husband.  All other systems reviewed and are negative.  EKGs/Labs/Other Studies Reviewed:    The following studies were reviewed today: No new or recent imaging  EKG:  EKG sinus rhythm with PACs.  Low voltage.  Recent Labs: No results found for requested labs within last 365 days.  Recent Lipid Panel    Component Value Date/Time   CHOL 185 12/28/2017 0927   TRIG 218 (H) 12/28/2017 0927   HDL 41 12/28/2017 0927   CHOLHDL 4.5 (H) 12/28/2017 0927   LDLCALC 100 (H) 12/28/2017 0927    Physical Exam:    VS:  BP 104/64   Pulse 76   Ht '5\' 6"'$  (1.676 m)   Wt 196 lb 9.6 oz (89.2 kg)   SpO2 95%   BMI 31.73 kg/m     Wt Readings from Last 3 Encounters:  01/02/22 196 lb 9.6 oz (89.2 kg)  08/24/21 196 lb 12.8 oz (89.3 kg)  07/14/21 194 lb (88 kg)     GEN: Overweight with BMI 32. No acute distress HEENT: Normal NECK: No JVD. LYMPHATICS: No lymphadenopathy CARDIAC: No murmur. RRR no gallop, or edema. VASCULAR:  Normal Pulses. No bruits. RESPIRATORY:  Clear to auscultation without rales, wheezing or rhonchi  ABDOMEN: Soft, non-tender, non-distended, No pulsatile  mass, MUSCULOSKELETAL: No deformity  SKIN: Warm and dry NEUROLOGIC:  Alert and oriented x 3 PSYCHIATRIC:  Normal affect   ASSESSMENT:    1. Paroxysmal atrial fibrillation (HCC)   2. Chronic diastolic heart failure (HCC)   3. Long-term current use of high risk medication other than anticoagulant   4. Essential hypertension   5. Hypercholesteremia    PLAN:    In order of problems listed above:  2-week monitor to rule out any significant A-fib burden.  She does have Watchman.   Has chronic dyspnea related to an underlying pulmonary process.  Followed by Dr. Lamonte Sakai.  No clinical evidence of volume overload on exam. She has had Watchman procedure.  She had intracranial hemorrhage on anticoagulation therapy.  She has had a durable atrial fibrillation ablation at [facility] by Dr. Omelia Blackwater. Blood pressure control is excellent Continue Repatha.  She requests a female cardiologist.  Recommended Dr. Harrington Challenger or Dr. Johney Frame.    Medication Adjustments/Labs and Tests Ordered: Current medicines are reviewed at length with the patient today.  Concerns regarding medicines are outlined above.  Orders Placed This Encounter  Procedures   LONG TERM MONITOR (3-14 DAYS)   EKG 12-Lead   No orders of the defined types were placed in this encounter.   Patient Instructions  Medication Instructions:  Your physician recommends that you continue on your current medications as directed. Please refer to the Current Medication list given to you today.  *If you need a refill on your cardiac medications before your next appointment, please call your pharmacy*  Lab Work: NONE  Testing/Procedures: Your physician has requested that you wear a Zio heart monitor for 14 days. This will be mailed to your home with instructions on how to apply the monitor and how to return it when finished. Please allow 2 weeks after returning the  heart monitor before our office calls you with the results.   Follow-Up: At [facility], you and your health needs are our priority.  As part of our continuing mission to provide you with exceptional heart care, we have created designated Provider Care Teams.  These Care Teams include your primary Cardiologist (physician) and Advanced Practice Providers (APPs -  Physician Assistants and Nurse Practitioners) who all work together to provide you with the care you need, when you need it.  Your next appointment:   9 month(s)  The format for your next appointment:   In Person  Provider:   Gwyndolyn Kaufman, MD or Dorris Carnes, MD  Other Instructions Bryn Gulling- Long Term Monitor Instructions     Your physician has requested you wear a ZIO patch monitor for 14 days.  This is a single patch monitor. [employer] supplies one patch monitor per enrollment. Additional  stickers are not available. Please do not apply patch if you will be having a Nuclear Stress Test,  Echocardiogram, Cardiac CT, MRI, or Chest Xray during the period you would be wearing the  monitor. The patch cannot be worn during these tests. You cannot remove and re-apply the  ZIO XT patch monitor.  Your ZIO patch monitor will be mailed 3 day USPS to your address on file. It may take 3-5 days  to receive your monitor after you have been enrolled.  Once you have received your monitor, please review the enclosed instructions. Your monitor  has already been registered assigning a specific monitor serial # to you.     Billing and Patient Assistance Program Information     We have supplied [employer] with any of your insurance information on file for billing purposes.  [employer] offers a sliding scale Patient Assistance Program for patients that do not have  insurance, or whose insurance does not completely cover the cost of the ZIO monitor.  You must apply for the Patient Assistance Program to qualify for this discounted rate.  To apply, please call [employer] at [phone], select option 4, select option 2, ask to apply for   Patient Assistance Program. Theodore Demark will ask your household income, and how many people  are in your household. They will quote your out-of-pocket cost based on that information.  [employer] will also be able to set up a 74-month interest-free payment plan if needed.     Applying the monitor     Shave hair from upper left chest.  Hold abrader disc by orange tab. Rub abrader in 40 strokes over the upper left chest as  indicated in your monitor instructions.  Clean area with 4 enclosed alcohol pads. Let dry.  Apply patch as indicated in monitor instructions. Patch will be placed under collarbone on left  side of chest with arrow pointing upward.  Rub patch adhesive wings for 2 minutes. Remove white label marked "1". Remove the white  label marked "2". Rub patch adhesive wings for 2 additional minutes.  While looking in a mirror, press and release button in center of patch. A small green light will  flash 3-4 times. This will be your only indicator that the monitor has been turned on.  Do not shower for the first 24 hours. You may shower after the first 24 hours.  Press the button if you feel a symptom. You will hear a small click. Record Date, Time and  Symptom in the Patient Logbook.  When you are ready to remove the patch, follow instructions on  the last 2 pages of Patient  Logbook. Stick patch monitor onto the last page of Patient Logbook.  Place Patient Logbook in the blue and white box. Use locking tab on box and tape box closed  securely. The blue and white box has prepaid postage on it. Please place it in the mailbox as  soon as possible. Your physician should have your test results approximately 7 days after the  monitor has been mailed back to [facility].  Call [place] at [phone] if you have questions regarding  your ZIO XT patch monitor. Call them immediately if you see an orange light blinking on your  monitor.  If your monitor falls off in less  than 4 days, contact our Monitor department at [phone].  If your monitor becomes loose or falls off after 4 days call [employer] at [phone] for  suggestions on securing your monitor.    Important Information About Sugar         Signed, Sinclair Grooms, MD  01/02/2022 2:28 PM    [facility]

## 2022-01-02 ENCOUNTER — Ambulatory Visit: Payer: Medicare Other | Attending: Interventional Cardiology | Admitting: Interventional Cardiology

## 2022-01-02 ENCOUNTER — Ambulatory Visit (INDEPENDENT_AMBULATORY_CARE_PROVIDER_SITE_OTHER): Payer: Medicare Other

## 2022-01-02 ENCOUNTER — Encounter: Payer: Self-pay | Admitting: Interventional Cardiology

## 2022-01-02 VITALS — BP 104/64 | HR 76 | Ht 66.0 in | Wt 196.6 lb

## 2022-01-02 DIAGNOSIS — Z79899 Other long term (current) drug therapy: Secondary | ICD-10-CM | POA: Insufficient documentation

## 2022-01-02 DIAGNOSIS — I480 Paroxysmal atrial fibrillation: Secondary | ICD-10-CM | POA: Diagnosis not present

## 2022-01-02 DIAGNOSIS — I10 Essential (primary) hypertension: Secondary | ICD-10-CM | POA: Diagnosis not present

## 2022-01-02 DIAGNOSIS — E7800 Pure hypercholesterolemia, unspecified: Secondary | ICD-10-CM | POA: Insufficient documentation

## 2022-01-02 DIAGNOSIS — I5032 Chronic diastolic (congestive) heart failure: Secondary | ICD-10-CM | POA: Diagnosis not present

## 2022-01-02 NOTE — Progress Notes (Unsigned)
Enrolled for [employer] to mail a ZIO XT long term holter monitor to the patients address on file.  

## 2022-01-02 NOTE — Patient Instructions (Signed)
Medication Instructions:  Your physician recommends that you continue on your current medications as directed. Please refer to the Current Medication list given to you today.  *If you need a refill on your cardiac medications before your next appointment, please call your pharmacy*  Lab Work: NONE  Testing/Procedures: Your physician has requested that you wear a Zio heart monitor for 14 days. This will be mailed to your home with instructions on how to apply the monitor and how to return it when finished. Please allow 2 weeks after returning the heart monitor before our office calls you with the results.   Follow-Up: At [facility], you and your health needs are our priority.  As part of our continuing mission to provide you with exceptional heart care, we have created designated Provider Care Teams.  These Care Teams include your primary Cardiologist (physician) and Advanced Practice Providers (APPs -  Physician Assistants and Nurse Practitioners) who all work together to provide you with the care you need, when you need it.  Your next appointment:   9 month(s)  The format for your next appointment:   In Person  Provider:   Gwyndolyn Kaufman, MD or Dorris Carnes, MD  Other Instructions Bryn Gulling- Long Term Monitor Instructions     Your physician has requested you wear a ZIO patch monitor for 14 days.  This is a single patch monitor. [employer] supplies one patch monitor per enrollment. Additional  stickers are not available. Please do not apply patch if you will be having a Nuclear Stress Test,  Echocardiogram, Cardiac CT, MRI, or Chest Xray during the period you would be wearing the  monitor. The patch cannot be worn during these tests. You cannot remove and re-apply the  ZIO XT patch monitor.  Your ZIO patch monitor will be mailed 3 day USPS to your address on file. It may take 3-5 days  to receive your monitor after you have been enrolled.  Once you have received your monitor,  please review the enclosed instructions. Your monitor  has already been registered assigning a specific monitor serial # to you.     Billing and Patient Assistance Program Information     We have supplied [employer] with any of your insurance information on file for billing purposes.  [employer] offers a sliding scale Patient Assistance Program for patients that do not have  insurance, or whose insurance does not completely cover the cost of the ZIO monitor.  You must apply for the Patient Assistance Program to qualify for this discounted rate.  To apply, please call [employer] at [phone], select option 4, select option 2, ask to apply for  Patient Assistance Program. Theodore Demark will ask your household income, and how many people  are in your household. They will quote your out-of-pocket cost based on that information.  [employer] will also be able to set up a 72-month interest-free payment plan if needed.     Applying the monitor     Shave hair from upper left chest.  Hold abrader disc by orange tab. Rub abrader in 40 strokes over the upper left chest as  indicated in your monitor instructions.  Clean area with 4 enclosed alcohol pads. Let dry.  Apply patch as indicated in monitor instructions. Patch will be placed under collarbone on left  side of chest with arrow pointing upward.  Rub patch adhesive wings for 2 minutes. Remove white label marked "1". Remove the white  label marked "2". Rub patch adhesive wings for 2  additional minutes.  While looking in a mirror, press and release button in center of patch. A small green light will  flash 3-4 times. This will be your only indicator that the monitor has been turned on.  Do not shower for the first 24 hours. You may shower after the first 24 hours.  Press the button if you feel a symptom. You will hear a small click. Record Date, Time and  Symptom in the Patient Logbook.  When you are ready to remove the patch, follow instructions on the last 2  pages of Patient  Logbook. Stick patch monitor onto the last page of Patient Logbook.  Place Patient Logbook in the blue and white box. Use locking tab on box and tape box closed  securely. The blue and white box has prepaid postage on it. Please place it in the mailbox as  soon as possible. Your physician should have your test results approximately 7 days after the  monitor has been mailed back to [facility].  Call [place] at [phone] if you have questions regarding  your ZIO XT patch monitor. Call them immediately if you see an orange light blinking on your  monitor.  If your monitor falls off in less than 4 days, contact our Monitor department at [phone].  If your monitor becomes loose or falls off after 4 days call [employer] at [phone] for  suggestions on securing your monitor.    Important Information About Sugar

## 2022-01-04 ENCOUNTER — Encounter: Payer: Self-pay | Admitting: Emergency Medicine

## 2022-01-04 ENCOUNTER — Ambulatory Visit (INDEPENDENT_AMBULATORY_CARE_PROVIDER_SITE_OTHER): Payer: Medicare Other | Admitting: Emergency Medicine

## 2022-01-04 VITALS — BP 132/78 | HR 60 | Temp 98.4°F | Ht 66.0 in | Wt 197.0 lb

## 2022-01-04 DIAGNOSIS — J9611 Chronic respiratory failure with hypoxia: Secondary | ICD-10-CM | POA: Diagnosis not present

## 2022-01-04 DIAGNOSIS — Z23 Encounter for immunization: Secondary | ICD-10-CM

## 2022-01-04 DIAGNOSIS — J849 Interstitial pulmonary disease, unspecified: Secondary | ICD-10-CM | POA: Diagnosis not present

## 2022-01-04 DIAGNOSIS — J449 Chronic obstructive pulmonary disease, unspecified: Secondary | ICD-10-CM

## 2022-01-04 NOTE — Assessment & Plan Note (Signed)
Marginal compliance with her oxygen.  She uses it reactively instead of preemptively with exertion.  We talked about strategies to improve this today.  Wear your oxygen at 2 L/min when you exert yourself.

## 2022-01-04 NOTE — Assessment & Plan Note (Signed)
Please continue your Breztri 2 puffs twice a day.  Rinse and gargle after using. Keep albuterol available to use 2 puffs if you needed for shortness of breath, chest tightness, wheezing. Flu shot today You would benefit from getting the COVID-19 vaccine this fall Follow with Dr. Lamonte Sakai in 12 months or sooner if you have any problems.

## 2022-01-04 NOTE — Assessment & Plan Note (Signed)
Resolved

## 2022-01-04 NOTE — Patient Instructions (Signed)
Please continue your Breztri 2 puffs twice a day.  Rinse and gargle after using. Keep albuterol available to use 2 puffs if you needed for shortness of breath, chest tightness, wheezing. Wear your oxygen at 2 L/min when you exert yourself. Flu shot today You would benefit from getting the COVID-19 vaccine this fall Follow with Dr. Lamonte Sakai in 12 months or sooner if you have any problems.

## 2022-01-04 NOTE — Progress Notes (Signed)
   History of Present Illness  ROV 07/14/21 --follow-up visit for 81 year old woman with a history of COPD, prior PE (off anticoagulation), atrial fibrillation/watchman, OSA on CPAP, scattered groundglass pulmonary infiltrates that were identified when she was on amiodarone (cleared on subsequent imaging).  She had a walking oximetry at her last visit and was titrated to 2 L/min pulsed oxygen with exertion.  She was started on Breztri but difficulty tolerating due to palpitations.  I tried ordering her a Marine scientist.  She underwent pulmonary function testing today as below.  She is very confused about her meds. She does not understands maintenance vs prn.   Pulmonary function testing reviewed by me shows mixed obstruction and restriction with an FEV1 of 1.33 L (65% predicted) without a bronchodilator response.  Her lung volumes are normal (question pseudonormalization), diffusion capacity is normal.   ROV 01/04/22 --81 year old woman with a history of remote pulmonary embolism, COPD, atrial fibrillation, OSA on CPAP.  She has a history of groundglass infiltrates, question related amiodarone (resolved).  She has documented hypoxemic respiratory failure on 2 L/min.  Currently managed on Breztri and she believes it is helpful.  She reports today that breathing has been pretty stable. She wonders if she may not be having some increased palpitations. She gets dyspnea with exertion including just walking. Her compliance with her O2 on exertion is marginal - she often does not use it. She never uses albuterol. No flares since last time, no pred or abx. Flu shot today.    Review Of Systems: As per HPI  Vital Signs BP 132/78 (BP Location: Right Arm, Patient Position: Sitting, Cuff Size: Normal)   Pulse 60   Temp 98.4 F (36.9 C) (Oral)   Ht '5\' 6"'$  (1.676 m)   Wt 197 lb (89.4 kg)   SpO2 94%   BMI 31.80 kg/m   Vitals:   01/04/22 1137  BP: 132/78  Pulse: 60  Temp: 98.4 F (36.9 C)  TempSrc:  Oral  SpO2: 94%  Weight: 197 lb (89.4 kg)  Height: '5\' 6"'$  (1.676 m)     Physical Exam: Gen: Pleasant, well-nourished, in no distress,  normal affect  ENT: No lesions,  mouth clear,  oropharynx clear, no postnasal drip  Neck: No JVD, no stridor  Lungs: No use of accessory muscles, no crackles or wheezing on normal respiration, scattered expiratory wheeze  Cardiovascular: RRR, heart sounds normal, no murmur or gallops, no peripheral edema  Musculoskeletal: No deformities, no cyanosis or clubbing  Neuro: alert, awake, interacts appropriately  Skin: Warm, no lesions or rashes    Assessment/Plan  COPD Please continue your Breztri 2 puffs twice a day.  Rinse and gargle after using. Keep albuterol available to use 2 puffs if you needed for shortness of breath, chest tightness, wheezing. Flu shot today You would benefit from getting the COVID-19 vaccine this fall Follow with Dr. Lamonte Sakai in 12 months or sooner if you have any problems.   Interstitial lung disease ([place]) Resolved  Chronic respiratory failure with hypoxia (HCC) Marginal compliance with her oxygen.  She uses it reactively instead of preemptively with exertion.  We talked about strategies to improve this today.  Wear your oxygen at 2 L/min when you exert yourself.     Baltazar Apo, MD, PhD 01/04/2022, 11:51 AM [facility] [phone] or if no answer [phone]

## 2022-01-05 DIAGNOSIS — I480 Paroxysmal atrial fibrillation: Secondary | ICD-10-CM | POA: Diagnosis not present

## 2022-01-05 DIAGNOSIS — Z23 Encounter for immunization: Secondary | ICD-10-CM | POA: Diagnosis not present

## 2022-01-30 DIAGNOSIS — I480 Paroxysmal atrial fibrillation: Secondary | ICD-10-CM | POA: Diagnosis not present

## 2022-02-08 ENCOUNTER — Encounter: Payer: Self-pay | Admitting: Interventional Cardiology

## 2022-02-08 DIAGNOSIS — J029 Acute pharyngitis, unspecified: Secondary | ICD-10-CM | POA: Diagnosis not present

## 2022-02-08 DIAGNOSIS — R051 Acute cough: Secondary | ICD-10-CM | POA: Diagnosis not present

## 2022-02-08 DIAGNOSIS — R0981 Nasal congestion: Secondary | ICD-10-CM | POA: Diagnosis not present

## 2022-02-08 DIAGNOSIS — J441 Chronic obstructive pulmonary disease with (acute) exacerbation: Secondary | ICD-10-CM | POA: Diagnosis not present

## 2022-03-08 ENCOUNTER — Other Ambulatory Visit: Payer: Self-pay | Admitting: Internal Medicine

## 2022-03-08 DIAGNOSIS — R1030 Lower abdominal pain, unspecified: Secondary | ICD-10-CM | POA: Diagnosis not present

## 2022-03-08 DIAGNOSIS — N9089 Other specified noninflammatory disorders of vulva and perineum: Secondary | ICD-10-CM | POA: Diagnosis not present

## 2022-03-08 DIAGNOSIS — G629 Polyneuropathy, unspecified: Secondary | ICD-10-CM | POA: Diagnosis not present

## 2022-03-08 DIAGNOSIS — J449 Chronic obstructive pulmonary disease, unspecified: Secondary | ICD-10-CM | POA: Diagnosis not present

## 2022-03-08 DIAGNOSIS — M8580 Other specified disorders of bone density and structure, unspecified site: Secondary | ICD-10-CM | POA: Diagnosis not present

## 2022-03-08 DIAGNOSIS — Z79899 Other long term (current) drug therapy: Secondary | ICD-10-CM | POA: Diagnosis not present

## 2022-03-08 DIAGNOSIS — M25562 Pain in left knee: Secondary | ICD-10-CM | POA: Diagnosis not present

## 2022-03-12 DIAGNOSIS — R0902 Hypoxemia: Secondary | ICD-10-CM | POA: Diagnosis not present

## 2022-03-12 DIAGNOSIS — J8417 Interstitial lung disease with progressive fibrotic phenotype in diseases classified elsewhere: Secondary | ICD-10-CM | POA: Diagnosis not present

## 2022-03-14 ENCOUNTER — Encounter: Payer: Self-pay | Admitting: Emergency Medicine

## 2022-03-15 DIAGNOSIS — D1801 Hemangioma of skin and subcutaneous tissue: Secondary | ICD-10-CM | POA: Diagnosis not present

## 2022-03-15 DIAGNOSIS — L821 Other seborrheic keratosis: Secondary | ICD-10-CM | POA: Diagnosis not present

## 2022-03-15 DIAGNOSIS — D692 Other nonthrombocytopenic purpura: Secondary | ICD-10-CM | POA: Diagnosis not present

## 2022-03-15 DIAGNOSIS — L814 Other melanin hyperpigmentation: Secondary | ICD-10-CM | POA: Diagnosis not present

## 2022-03-15 DIAGNOSIS — L853 Xerosis cutis: Secondary | ICD-10-CM | POA: Diagnosis not present

## 2022-03-15 DIAGNOSIS — D224 Melanocytic nevi of scalp and neck: Secondary | ICD-10-CM | POA: Diagnosis not present

## 2022-03-15 DIAGNOSIS — D2239 Melanocytic nevi of other parts of face: Secondary | ICD-10-CM | POA: Diagnosis not present

## 2022-03-15 DIAGNOSIS — L820 Inflamed seborrheic keratosis: Secondary | ICD-10-CM | POA: Diagnosis not present

## 2022-03-15 DIAGNOSIS — Z85828 Personal history of other malignant neoplasm of skin: Secondary | ICD-10-CM | POA: Diagnosis not present

## 2022-03-15 DIAGNOSIS — D225 Melanocytic nevi of trunk: Secondary | ICD-10-CM | POA: Diagnosis not present

## 2022-03-22 DIAGNOSIS — E785 Hyperlipidemia, unspecified: Secondary | ICD-10-CM | POA: Diagnosis not present

## 2022-03-22 DIAGNOSIS — I110 Hypertensive heart disease with heart failure: Secondary | ICD-10-CM | POA: Diagnosis not present

## 2022-03-22 DIAGNOSIS — J9610 Chronic respiratory failure, unspecified whether with hypoxia or hypercapnia: Secondary | ICD-10-CM | POA: Diagnosis not present

## 2022-03-22 DIAGNOSIS — I4891 Unspecified atrial fibrillation: Secondary | ICD-10-CM | POA: Diagnosis not present

## 2022-03-22 DIAGNOSIS — E261 Secondary hyperaldosteronism: Secondary | ICD-10-CM | POA: Diagnosis not present

## 2022-03-22 DIAGNOSIS — F4001 Agoraphobia with panic disorder: Secondary | ICD-10-CM | POA: Diagnosis not present

## 2022-03-22 DIAGNOSIS — F3341 Major depressive disorder, recurrent, in partial remission: Secondary | ICD-10-CM | POA: Diagnosis not present

## 2022-03-22 DIAGNOSIS — G4733 Obstructive sleep apnea (adult) (pediatric): Secondary | ICD-10-CM | POA: Diagnosis not present

## 2022-03-22 DIAGNOSIS — J449 Chronic obstructive pulmonary disease, unspecified: Secondary | ICD-10-CM | POA: Diagnosis not present

## 2022-03-22 DIAGNOSIS — E559 Vitamin D deficiency, unspecified: Secondary | ICD-10-CM | POA: Diagnosis not present

## 2022-03-22 DIAGNOSIS — I509 Heart failure, unspecified: Secondary | ICD-10-CM | POA: Diagnosis not present

## 2022-03-22 DIAGNOSIS — D6869 Other thrombophilia: Secondary | ICD-10-CM | POA: Diagnosis not present

## 2022-03-31 ENCOUNTER — Other Ambulatory Visit (HOSPITAL_COMMUNITY): Payer: Self-pay

## 2022-04-04 ENCOUNTER — Other Ambulatory Visit (HOSPITAL_COMMUNITY): Payer: Self-pay

## 2022-04-04 ENCOUNTER — Ambulatory Visit
Admission: RE | Admit: 2022-04-04 | Discharge: 2022-04-04 | Disposition: A | Discharge status: Home / Self Care | Payer: PPO | Source: Ambulatory Visit | Attending: Internal Medicine | Admitting: Internal Medicine

## 2022-04-04 ENCOUNTER — Other Ambulatory Visit: Payer: Self-pay

## 2022-04-04 DIAGNOSIS — R112 Nausea with vomiting, unspecified: Secondary | ICD-10-CM | POA: Diagnosis not present

## 2022-04-04 DIAGNOSIS — R197 Diarrhea, unspecified: Secondary | ICD-10-CM | POA: Diagnosis not present

## 2022-04-04 DIAGNOSIS — R1030 Lower abdominal pain, unspecified: Secondary | ICD-10-CM

## 2022-04-04 DIAGNOSIS — R1032 Left lower quadrant pain: Secondary | ICD-10-CM | POA: Diagnosis not present

## 2022-04-04 DIAGNOSIS — R140 Abdominal distension (gaseous): Secondary | ICD-10-CM | POA: Diagnosis not present

## 2022-04-04 MED ORDER — IOPAMIDOL (ISOVUE-300) INJECTION 61%
100.0000 mL | Freq: Once | INTRAVENOUS | Status: AC | PRN
  Administered 2022-04-04: 100 mL via INTRAVENOUS

## 2022-04-04 MED ORDER — REPATHA 140 MG/ML ~~LOC~~ SOSY
140.0000 mg | PREFILLED_SYRINGE | SUBCUTANEOUS | 5 refills | Status: DC
  Filled 2022-04-04: qty 2, 28d supply, fill #0
  Filled 2022-04-28: qty 2, 28d supply, fill #1

## 2022-04-05 ENCOUNTER — Other Ambulatory Visit: Payer: Self-pay

## 2022-04-12 DIAGNOSIS — J8417 Interstitial lung disease with progressive fibrotic phenotype in diseases classified elsewhere: Secondary | ICD-10-CM | POA: Diagnosis not present

## 2022-04-12 DIAGNOSIS — R0902 Hypoxemia: Secondary | ICD-10-CM | POA: Diagnosis not present

## 2022-04-28 ENCOUNTER — Other Ambulatory Visit (HOSPITAL_COMMUNITY): Payer: Self-pay

## 2022-04-28 ENCOUNTER — Other Ambulatory Visit: Payer: Self-pay

## 2022-04-28 ENCOUNTER — Other Ambulatory Visit (HOSPITAL_BASED_OUTPATIENT_CLINIC_OR_DEPARTMENT_OTHER): Payer: Self-pay

## 2022-04-28 MED ORDER — BREZTRI AEROSPHERE 160-9-4.8 MCG/ACT IN AERO
2.0000 | INHALATION_SPRAY | Freq: Two times a day (BID) | RESPIRATORY_TRACT | 1 refills | Status: DC
  Filled 2022-04-28: qty 10.7, 30d supply, fill #0

## 2022-04-29 ENCOUNTER — Other Ambulatory Visit (HOSPITAL_COMMUNITY): Payer: Self-pay

## 2022-05-01 ENCOUNTER — Other Ambulatory Visit: Payer: Self-pay

## 2022-05-03 DIAGNOSIS — F3341 Major depressive disorder, recurrent, in partial remission: Secondary | ICD-10-CM | POA: Diagnosis not present

## 2022-05-08 DIAGNOSIS — J449 Chronic obstructive pulmonary disease, unspecified: Secondary | ICD-10-CM | POA: Diagnosis not present

## 2022-05-08 DIAGNOSIS — G4733 Obstructive sleep apnea (adult) (pediatric): Secondary | ICD-10-CM | POA: Diagnosis not present

## 2022-05-11 DIAGNOSIS — R0902 Hypoxemia: Secondary | ICD-10-CM | POA: Diagnosis not present

## 2022-05-11 DIAGNOSIS — J8417 Interstitial lung disease with progressive fibrotic phenotype in diseases classified elsewhere: Secondary | ICD-10-CM | POA: Diagnosis not present

## 2022-05-30 DIAGNOSIS — J9611 Chronic respiratory failure with hypoxia: Secondary | ICD-10-CM

## 2022-05-31 ENCOUNTER — Other Ambulatory Visit (HOSPITAL_COMMUNITY): Payer: Self-pay

## 2022-05-31 ENCOUNTER — Other Ambulatory Visit: Payer: Self-pay

## 2022-05-31 ENCOUNTER — Encounter (HOSPITAL_COMMUNITY): Payer: Self-pay

## 2022-05-31 MED ORDER — BREZTRI AEROSPHERE 160-9-4.8 MCG/ACT IN AERO
2.0000 | INHALATION_SPRAY | Freq: Two times a day (BID) | RESPIRATORY_TRACT | 1 refills | Status: DC
  Filled 2022-05-31: qty 32.1, 90d supply, fill #0
  Filled 2022-09-01: qty 32.1, 90d supply, fill #1

## 2022-05-31 MED ORDER — REPATHA SURECLICK 140 MG/ML ~~LOC~~ SOAJ
140.0000 mg | SUBCUTANEOUS | 1 refills | Status: DC
  Filled 2022-05-31 (×2): qty 6, 84d supply, fill #0

## 2022-05-31 NOTE — Telephone Encounter (Signed)
Order was placed to send to [employer] and based on pt's note she wants it to go to [facility].  Only thing on order is "wants to change to [employer]" - no settings listed.  Pt hasn't been walked since July and last ov was in Nov.  I verified with Mardene Celeste at [facility] pt will need to have a new ov and be walked before she can change dme companies.  Will route back to triage so they can make pt aware and schedule what is needed.  Will close out order that is placed due to no info listed on the order and nothing can be done with it.

## 2022-06-01 ENCOUNTER — Other Ambulatory Visit: Payer: Self-pay

## 2022-06-01 ENCOUNTER — Other Ambulatory Visit (HOSPITAL_COMMUNITY): Payer: Self-pay

## 2022-06-02 ENCOUNTER — Other Ambulatory Visit (HOSPITAL_COMMUNITY): Payer: Self-pay

## 2022-06-06 ENCOUNTER — Other Ambulatory Visit (HOSPITAL_COMMUNITY): Payer: Self-pay | Admitting: Internal Medicine

## 2022-06-06 DIAGNOSIS — E782 Mixed hyperlipidemia: Secondary | ICD-10-CM

## 2022-06-11 DIAGNOSIS — J8417 Interstitial lung disease with progressive fibrotic phenotype in diseases classified elsewhere: Secondary | ICD-10-CM | POA: Diagnosis not present

## 2022-06-11 DIAGNOSIS — R0902 Hypoxemia: Secondary | ICD-10-CM | POA: Diagnosis not present

## 2022-06-12 ENCOUNTER — Encounter: Payer: Self-pay | Admitting: Primary Care

## 2022-06-12 ENCOUNTER — Ambulatory Visit: Payer: PPO | Admitting: Primary Care

## 2022-06-12 VITALS — BP 104/62 | HR 58 | Ht 64.0 in | Wt 201.4 lb

## 2022-06-12 DIAGNOSIS — J849 Interstitial pulmonary disease, unspecified: Secondary | ICD-10-CM | POA: Diagnosis not present

## 2022-06-12 DIAGNOSIS — J9611 Chronic respiratory failure with hypoxia: Secondary | ICD-10-CM

## 2022-06-12 DIAGNOSIS — J449 Chronic obstructive pulmonary disease, unspecified: Secondary | ICD-10-CM

## 2022-06-12 DIAGNOSIS — G4730 Sleep apnea, unspecified: Secondary | ICD-10-CM | POA: Diagnosis not present

## 2022-06-12 NOTE — Assessment & Plan Note (Signed)
Resolved

## 2022-06-12 NOTE — Assessment & Plan Note (Signed)
-   Patient is 97% compliant with CPAP use greater than 4 hours in the last 30 days.  Current pressure 10 cm H2O; Residual AHI 1.3/hour.  - No changes - Continue CPAP nightly for 4 to 6 hours or longer with oxygen

## 2022-06-12 NOTE — Progress Notes (Signed)
@Patient  ID: Judy White, female    DOB: Sep 15, 1940, 82 y.o.   MRN: [id number]  Chief Complaint  Patient presents with   Follow-up    Referring provider: Marden Noble, MD  HPI: 82 year old female, former smoker quit in 2017 (30-pack-year history).  Past medical history significant for chronic diastolic heart failure, hypertension, A-fib/on amiodarone, pulmonary embolism, COPD, interstitial lung disease, pneumothorax on left, chronic respiratory failure.  06/12/2022 Patient presents today to requalify for oxygen. She is feeling well today. She uses [employer] twice daily. She uses Albuterol once a month. She has POC and home concentrator from [facility]. She changed insurance companies and [employer] is no longer in network.  She needs to change DME companys to [facility]. She uses 2L oxygen with exertion and 3L on pulsed. She wears oxygen with CPAP and is compliance with use.   Review download 05/10/22-06/08/22 29/30 days (97%) > 4 hours Average usage 6 hours 34 minutes Pressure 10 cm H2O AHI 1.3   Allergies  Allergen Reactions   Bupropion     Other reaction(s): anxiety   Rosuvastatin     Myalgias on 5mg  every other day   Statins     Immunization History  Administered Date(s) Administered   Fluad Quad(high Dose 65+) 01/04/2022   Influenza Split 12/01/2008, 11/05/2009, 12/19/2011, 12/12/2012, 11/27/2013, 11/18/2014, 11/12/2015   Influenza, High Dose Seasonal PF 11/19/2015, 11/24/2016, 10/30/2018, 01/15/2020, 12/02/2020   Influenza,inj,Quad PF,6+ Mos 11/24/2010   Influenza-Unspecified 12/21/2017   Moderna Sars-Covid-2 Vaccination 12/14/2020   PFIZER(Purple Top)SARS-COV-2 Vaccination 03/10/2019, 03/30/2019, 12/13/2019   Pneumococcal Conjugate-13 06/23/2013   Pneumococcal Polysaccharide-23 05/28/2002, 12/03/2015   Td 05/28/2002, 05/15/2009   Zoster Recombinat (Shingrix) 07/10/2017, 09/10/2017   Zoster, Live 05/28/2007, 09/11/2017, 12/11/2017    Past Medical History:  Diagnosis  Date   Agoraphobia    Anxiety    Arthritis    "normal for the age; nothing gives me problems" (01/24/2016)   COPD (chronic obstructive pulmonary disease)    "mild" (01/24/2016)   Depressive disorder, not elsewhere classified    DVT (deep venous thrombosis) 10/08/2015   "they said the blood clot went from one of my legs to my lung"   History of hiatal hernia    "small one" (01/24/2016)   Hypercholesteremia    Hypertension    pt denies this hx on 01/24/2016   Myalgia and myositis, unspecified    Osteopenia    Overactive bladder    Persistent atrial fibrillation    a. s/p Watchman implant 12/2014   Presence of Watchman left atrial appendage closure device    Pulmonary embolism 10/08/2015   Sleep apnea    USES CPAP     Tobacco History: Social History   Tobacco Use  Smoking Status Former   Packs/day: 0.50   Years: 60.00   Additional pack years: 0.00   Total pack years: 30.00   Types: Cigarettes   Quit date: 10/08/2015   Years since quitting: 6.6  Smokeless Tobacco Never   Counseling given: Not Answered   Outpatient Medications Prior to Visit  Medication Sig Dispense Refill   acetaminophen (TYLENOL) 500 MG tablet Take 500 mg by mouth every 6 (six) hours as needed for moderate pain or headache.     albuterol (VENTOLIN HFA) 108 (90 Base) MCG/ACT inhaler Inhale 2 puffs into the lungs every 4 (four) hours as needed for wheezing or shortness of breath. 8 g 3   aspirin EC 81 MG tablet Take 81 mg by mouth 3 (three) times a week. Swallow  whole. Pt take 1 tablet 81 mg on Mondays, Wednesdays, and Fridays.     Budeson-Glycopyrrol-Formoterol (BREZTRI AEROSPHERE) 160-9-4.8 MCG/ACT AERO Inhale 2 puffs into the lungs 2 (two) times daily. 32.1 g 1   Calcium-Vitamin D (CALTRATE 600 PLUS-VIT D PO) Take 1 tablet by mouth daily.      Cholecalciferol (VITAMIN D3) 2000 units TABS Take 2,000 Units by mouth daily.     clonazePAM (KLONOPIN) 1 MG tablet Take 1.5-2 mg by mouth daily.       Evolocumab (REPATHA SURECLICK) 140 MG/ML SOAJ Every 2 wks.     FLUoxetine (PROZAC) 20 MG capsule Take 20 mg by mouth daily.      furosemide (LASIX) 20 MG tablet Take 1 tablet (20 mg total) by mouth daily. 90 tablet 3   gabapentin (NEURONTIN) 100 MG capsule Take 100 mg by mouth 3 (three) times daily.     metoprolol succinate (TOPROL-XL) 25 MG 24 hr tablet Take 12.5 mg by mouth every morning.     oxybutynin (DITROPAN-XL) 10 MG 24 hr tablet Take 10 mg by mouth daily.      Potassium 99 MG TABS Take 99 mg by mouth daily.     vitamin C (ASCORBIC ACID) 500 MG tablet Take 500 mg by mouth daily.     vitamin E 180 MG (400 UNITS) capsule Take 400 Units daily by mouth.     Budeson-Glycopyrrol-Formoterol (BREZTRI AEROSPHERE) 160-9-4.8 MCG/ACT AERO Inhale 2 puffs into the lungs as needed. 5.9 g 0   Budeson-Glycopyrrol-Formoterol (BREZTRI AEROSPHERE) 160-9-4.8 MCG/ACT AERO Inhale 2 puffs into the lungs 2 (two) times daily. (Patient not taking: Reported on 06/12/2022) 10.7 g 1   Evolocumab (REPATHA SURECLICK) 140 MG/ML SOAJ Inject 140 mg into the skin every 14 (fourteen) days. 6 mL 1   Evolocumab (REPATHA) 140 MG/ML SOSY Inject 140 mg into the skin every 14 (fourteen) days. 2 mL 5   No facility-administered medications prior to visit.      Review of Systems  Review of Systems  Constitutional: Negative.   HENT: Negative.    Respiratory: Negative.    Cardiovascular: Negative.      Physical Exam  BP 104/62 (BP Location: Left Arm, Patient Position: Sitting, Cuff Size: Normal)   Pulse (!) 58   Ht  (1.626 m)   Wt 201 lb 6.4 oz (91.4 kg)   SpO2 93%   BMI 34.57 kg/m  Physical Exam Constitutional:      General: She is not in acute distress.    Appearance: Normal appearance. She is not ill-appearing.  HENT:     Head: Normocephalic and atraumatic.     Mouth/Throat:     Mouth: Mucous membranes are moist.     Pharynx: Oropharynx is clear.  Cardiovascular:     Rate and Rhythm: Normal rate and  regular rhythm.  Pulmonary:     Effort: Pulmonary effort is normal.     Breath sounds: Normal breath sounds.  Musculoskeletal:        General: Normal range of motion.  Skin:    General: Skin is warm and dry.  Neurological:     General: No focal deficit present.     Mental Status: She is alert and oriented to person, place, and time. Mental status is at baseline.  Psychiatric:        Mood and Affect: Mood normal.        Behavior: Behavior normal.        Thought Content: Thought content normal.  Judgment: Judgment normal.      Lab Results:  CBC    Component Value Date/Time   WBC 18.1 (H) 12/28/2018 0315   RBC 4.19 12/28/2018 0315   HGB 12.3 12/28/2018 0315   HCT 38.9 12/28/2018 0315   PLT 205 12/28/2018 0315   MCV 92.8 12/28/2018 0315   MCH 29.4 12/28/2018 0315   MCHC 31.6 12/28/2018 0315   RDW 13.1 12/28/2018 0315   LYMPHSABS 1.7 12/02/2015 1611   MONOABS 0.9 12/02/2015 1611   EOSABS 0.3 12/02/2015 1611   BASOSABS 0.1 12/02/2015 1611    BMET    Component Value Date/Time   NA 138 12/28/2018 0315   NA 143 03/09/2017 1247   K 4.8 12/28/2018 0315   CL 102 12/28/2018 0315   CO2 27 12/28/2018 0315   GLUCOSE 162 (H) 12/28/2018 0315   BUN 19 12/28/2018 0315   BUN 23 03/09/2017 1247   CREATININE 0.79 12/28/2018 0315   CREATININE 0.94 (H) 10/26/2015 0929   CALCIUM 8.3 (L) 12/28/2018 0315   GFRNONAA >60 12/28/2018 0315   GFRNONAA 59 (L) 10/26/2015 0929   GFRAA >60 12/28/2018 0315   GFRAA 68 10/26/2015 0929    BNP    Component Value Date/Time   BNP 375.6 (H) 12/02/2015 1611    ProBNP    Component Value Date/Time   PROBNP 136.0 (H) 08/25/2014 1518    Imaging: No results found.   Assessment & Plan:   COPD - Stable; Not acutely exacerbated.  Using [place] on average once a month. - Continue Breztri 2 puffs twice daily and Albuterol HFA 2 puffs every 4-6 hours as needed for sob/wheezing - FU in 6 months with Dr. Delton Coombes or sooner if needed   Chronic  respiratory failure with hypoxia (HCC) - Patient re-qualified for oxygen today. Needs to change DME companies from [facility] to [facility] due to insurance coverage. Order placed for patient to be provided POC and home concentrator @ 2-3L continuous.   Interstitial lung disease (HCC) - Resolved  Sleep apnea - Patient is 97% compliant with CPAP use greater than 4 hours in the last 30 days.  Current pressure 10 cm H2O; Residual AHI 1.3/hour.  - No changes - Continue CPAP nightly for 4 to 6 hours or longer with oxygen   Glenford Bayley, NP 06/12/2022

## 2022-06-12 NOTE — Patient Instructions (Addendum)
   Recommendations - Continue Breztri 2 puffs morning and evening - Use albuterol 2 puffs every 4-6 hours as needed for breakthrough shortness of breath or wheezing - Continue to wear oxygen with exertion and at nighttime with your CPAP  Orders - Oxygen order sent to [facility] for POC and home concentrator, needing 2 to 3 L   Follow-up: - 6 months with Dr. Delton Coombes or sooner if needed

## 2022-06-12 NOTE — Assessment & Plan Note (Signed)
-   Patient re-qualified for oxygen today. Needs to change DME companies from [facility] to [facility] due to insurance coverage. Order placed for patient to be provided POC and home concentrator @ 2-3L continuous.

## 2022-06-12 NOTE — Assessment & Plan Note (Signed)
-   Stable; Not acutely exacerbated.  Using [place] on average once a month. - Continue Breztri 2 puffs twice daily and Albuterol HFA 2 puffs every 4-6 hours as needed for sob/wheezing - FU in 6 months with Dr. Delton Coombes or sooner if needed

## 2022-06-13 ENCOUNTER — Telehealth: Payer: Self-pay | Admitting: Primary Care

## 2022-06-13 DIAGNOSIS — J9611 Chronic respiratory failure with hypoxia: Secondary | ICD-10-CM

## 2022-06-13 NOTE — Telephone Encounter (Signed)
Pt calling because she was in yesterday to see Ms. Clent Ridges to do all the testing needed in order to change her O2 company.  She called [facility] and they insist she drive to [facility] to take another test in order to get her Portable O2.Pt states she does not want to do that and all the testing they need was completed here.  Please call PT to advise @ [phone]

## 2022-06-13 NOTE — Telephone Encounter (Signed)
Called patient and explained that the walk test and the oxygen order we sent yesterday was sent with the order yesterday.  Can we make sure the walk test was sent with the oxygen order yesterday? Because [facility] is telling her she has to come out to [place] to do another walk test to qualify for the POC  Thank you

## 2022-06-14 NOTE — Telephone Encounter (Signed)
She was walked in office, qualifying walk should be in chart. Again if she can get what she needs (POC and home concentrator) with [facility] then she needs to switch d/t her insurance. Can we please fix order if needed.

## 2022-06-14 NOTE — Telephone Encounter (Signed)
She needed to change because [facility] was out of network and not covered by her insurance. She said if it was going to be a hassle she would stay with [facility]. Is [facility] not have to provide her home concentrator and POC if she switches to them?

## 2022-06-14 NOTE — Telephone Encounter (Signed)
Judy White, [place]; [place], [place]; [name] If she wants a POC through with [place] she'll have to switch all together. We will need a new start so a order that states continues and sats that reflect that.  Thanks!

## 2022-06-14 NOTE — Telephone Encounter (Signed)
Judy White; Sharene Butters, [place]; [place], [place]; [name] Patient states she was not interested in changing DME providers to [place]. She has to have the home set with [place] in order for [place] to provide her with a POC.  Thanks!

## 2022-06-15 NOTE — Addendum Note (Signed)
Addended by: Lavetta Nielsen L on: 06/15/2022 11:19 AM   Modules accepted: Orders

## 2022-06-15 NOTE — Telephone Encounter (Signed)
April can you just put an order in for new oxygen start with [facility]/ ask that they please provide patient with home concentrator and POC. Needs 3L. I'm not sure why this is being made out to be so difficult.   SATURATION QUALIFICATIONS: (This note is used to comply with regulatory documentation for home oxygen)  Patient Saturations on Room Air at Rest = 93%  Patient Saturations on Room Air while Ambulating = 87%  Patient Saturations on 3 Liters POC of oxygen while Ambulating = 95%  Patient was walked on a POC  Freida Busman [facility] 06/12/2022

## 2022-06-15 NOTE — Telephone Encounter (Signed)
New order placed for oxygen to go to [facility]. Patient is aware of new order being placed and that she needs this due to insurance purposes. Nothing further needed

## 2022-06-15 NOTE — Telephone Encounter (Signed)
Raven,  Do I need to place a new order?   Im just confused at this point. Because the patient needs a POC and home concentrator. She asked to go to [facility] in office.  So can you please help me out  Thank you

## 2022-06-20 DIAGNOSIS — J9611 Chronic respiratory failure with hypoxia: Secondary | ICD-10-CM | POA: Diagnosis not present

## 2022-06-20 DIAGNOSIS — G4733 Obstructive sleep apnea (adult) (pediatric): Secondary | ICD-10-CM | POA: Diagnosis not present

## 2022-06-20 DIAGNOSIS — J449 Chronic obstructive pulmonary disease, unspecified: Secondary | ICD-10-CM | POA: Diagnosis not present

## 2022-06-22 ENCOUNTER — Ambulatory Visit (HOSPITAL_COMMUNITY)
Admission: RE | Admit: 2022-06-22 | Discharge: 2022-06-22 | Disposition: A | Discharge status: Home / Self Care | Payer: PPO | Source: Ambulatory Visit | Attending: Internal Medicine | Admitting: Internal Medicine

## 2022-06-22 DIAGNOSIS — E782 Mixed hyperlipidemia: Secondary | ICD-10-CM

## 2022-06-27 DIAGNOSIS — I110 Hypertensive heart disease with heart failure: Secondary | ICD-10-CM | POA: Diagnosis not present

## 2022-06-27 DIAGNOSIS — I5032 Chronic diastolic (congestive) heart failure: Secondary | ICD-10-CM | POA: Diagnosis not present

## 2022-06-27 DIAGNOSIS — I480 Paroxysmal atrial fibrillation: Secondary | ICD-10-CM | POA: Diagnosis not present

## 2022-06-27 DIAGNOSIS — I10 Essential (primary) hypertension: Secondary | ICD-10-CM | POA: Diagnosis not present

## 2022-06-27 DIAGNOSIS — E782 Mixed hyperlipidemia: Secondary | ICD-10-CM | POA: Diagnosis not present

## 2022-06-27 DIAGNOSIS — J449 Chronic obstructive pulmonary disease, unspecified: Secondary | ICD-10-CM | POA: Diagnosis not present

## 2022-06-27 DIAGNOSIS — I700 Atherosclerosis of aorta: Secondary | ICD-10-CM | POA: Diagnosis not present

## 2022-07-20 DIAGNOSIS — J449 Chronic obstructive pulmonary disease, unspecified: Secondary | ICD-10-CM | POA: Diagnosis not present

## 2022-07-20 DIAGNOSIS — G4733 Obstructive sleep apnea (adult) (pediatric): Secondary | ICD-10-CM | POA: Diagnosis not present

## 2022-07-20 DIAGNOSIS — J9611 Chronic respiratory failure with hypoxia: Secondary | ICD-10-CM | POA: Diagnosis not present

## 2022-08-08 ENCOUNTER — Other Ambulatory Visit: Payer: Self-pay

## 2022-08-08 MED ORDER — FUROSEMIDE 20 MG PO TABS: 20.0000 mg | ORAL_TABLET | Freq: Every day | ORAL | 1 refills | Status: DC

## 2022-08-20 DIAGNOSIS — J9611 Chronic respiratory failure with hypoxia: Secondary | ICD-10-CM | POA: Diagnosis not present

## 2022-08-20 DIAGNOSIS — J449 Chronic obstructive pulmonary disease, unspecified: Secondary | ICD-10-CM | POA: Diagnosis not present

## 2022-08-20 DIAGNOSIS — G4733 Obstructive sleep apnea (adult) (pediatric): Secondary | ICD-10-CM | POA: Diagnosis not present

## 2022-08-22 DIAGNOSIS — S99911A Unspecified injury of right ankle, initial encounter: Secondary | ICD-10-CM | POA: Diagnosis not present

## 2022-08-22 DIAGNOSIS — W108XXA Fall (on) (from) other stairs and steps, initial encounter: Secondary | ICD-10-CM | POA: Diagnosis not present

## 2022-08-22 DIAGNOSIS — S59901A Unspecified injury of right elbow, initial encounter: Secondary | ICD-10-CM | POA: Diagnosis not present

## 2022-08-22 DIAGNOSIS — S20211A Contusion of right front wall of thorax, initial encounter: Secondary | ICD-10-CM | POA: Diagnosis not present

## 2022-09-01 ENCOUNTER — Other Ambulatory Visit (HOSPITAL_COMMUNITY): Payer: Self-pay

## 2022-09-01 ENCOUNTER — Other Ambulatory Visit: Payer: Self-pay

## 2022-09-11 ENCOUNTER — Telehealth: Payer: Self-pay

## 2022-09-11 NOTE — Telephone Encounter (Signed)
Note completed and faxed

## 2022-09-11 NOTE — Telephone Encounter (Signed)
Amy with Dr. Peggye Ley dental office would like letter/note for Pre Dental Antibiotic if required to be faxed to [phone].  Please advise.  Thank you.

## 2022-09-19 DIAGNOSIS — J449 Chronic obstructive pulmonary disease, unspecified: Secondary | ICD-10-CM | POA: Diagnosis not present

## 2022-09-19 DIAGNOSIS — J9611 Chronic respiratory failure with hypoxia: Secondary | ICD-10-CM | POA: Diagnosis not present

## 2022-09-19 DIAGNOSIS — G4733 Obstructive sleep apnea (adult) (pediatric): Secondary | ICD-10-CM | POA: Diagnosis not present

## 2022-10-02 DIAGNOSIS — F3341 Major depressive disorder, recurrent, in partial remission: Secondary | ICD-10-CM | POA: Diagnosis not present

## 2022-10-20 DIAGNOSIS — G4733 Obstructive sleep apnea (adult) (pediatric): Secondary | ICD-10-CM | POA: Diagnosis not present

## 2022-10-20 DIAGNOSIS — J9611 Chronic respiratory failure with hypoxia: Secondary | ICD-10-CM | POA: Diagnosis not present

## 2022-10-20 DIAGNOSIS — J449 Chronic obstructive pulmonary disease, unspecified: Secondary | ICD-10-CM | POA: Diagnosis not present

## 2022-11-10 DIAGNOSIS — Z79899 Other long term (current) drug therapy: Secondary | ICD-10-CM | POA: Diagnosis not present

## 2022-11-10 DIAGNOSIS — R29898 Other symptoms and signs involving the musculoskeletal system: Secondary | ICD-10-CM | POA: Diagnosis not present

## 2022-11-10 DIAGNOSIS — I10 Essential (primary) hypertension: Secondary | ICD-10-CM | POA: Diagnosis not present

## 2022-11-10 DIAGNOSIS — Z0000 Encounter for general adult medical examination without abnormal findings: Secondary | ICD-10-CM | POA: Diagnosis not present

## 2022-11-10 DIAGNOSIS — J9611 Chronic respiratory failure with hypoxia: Secondary | ICD-10-CM | POA: Diagnosis not present

## 2022-11-10 DIAGNOSIS — F325 Major depressive disorder, single episode, in full remission: Secondary | ICD-10-CM | POA: Diagnosis not present

## 2022-11-10 DIAGNOSIS — M8589 Other specified disorders of bone density and structure, multiple sites: Secondary | ICD-10-CM | POA: Diagnosis not present

## 2022-11-10 DIAGNOSIS — Z9981 Dependence on supplemental oxygen: Secondary | ICD-10-CM | POA: Diagnosis not present

## 2022-11-10 DIAGNOSIS — J449 Chronic obstructive pulmonary disease, unspecified: Secondary | ICD-10-CM | POA: Diagnosis not present

## 2022-11-10 DIAGNOSIS — Z1331 Encounter for screening for depression: Secondary | ICD-10-CM | POA: Diagnosis not present

## 2022-11-10 DIAGNOSIS — Z9181 History of falling: Secondary | ICD-10-CM | POA: Diagnosis not present

## 2022-11-10 DIAGNOSIS — I700 Atherosclerosis of aorta: Secondary | ICD-10-CM | POA: Diagnosis not present

## 2022-11-10 DIAGNOSIS — E782 Mixed hyperlipidemia: Secondary | ICD-10-CM | POA: Diagnosis not present

## 2022-11-20 DIAGNOSIS — J449 Chronic obstructive pulmonary disease, unspecified: Secondary | ICD-10-CM | POA: Diagnosis not present

## 2022-11-20 DIAGNOSIS — J9611 Chronic respiratory failure with hypoxia: Secondary | ICD-10-CM | POA: Diagnosis not present

## 2022-11-20 DIAGNOSIS — G4733 Obstructive sleep apnea (adult) (pediatric): Secondary | ICD-10-CM | POA: Diagnosis not present

## 2022-11-21 DIAGNOSIS — H04222 Epiphora due to insufficient drainage, left lacrimal gland: Secondary | ICD-10-CM | POA: Diagnosis not present

## 2022-11-21 DIAGNOSIS — H52201 Unspecified astigmatism, right eye: Secondary | ICD-10-CM | POA: Diagnosis not present

## 2022-11-21 DIAGNOSIS — Z961 Presence of intraocular lens: Secondary | ICD-10-CM | POA: Diagnosis not present

## 2022-11-21 DIAGNOSIS — H353122 Nonexudative age-related macular degeneration, left eye, intermediate dry stage: Secondary | ICD-10-CM | POA: Diagnosis not present

## 2022-11-22 ENCOUNTER — Telehealth: Payer: Self-pay | Admitting: Primary Care

## 2022-11-22 DIAGNOSIS — I480 Paroxysmal atrial fibrillation: Secondary | ICD-10-CM | POA: Diagnosis not present

## 2022-11-22 DIAGNOSIS — J849 Interstitial pulmonary disease, unspecified: Secondary | ICD-10-CM

## 2022-11-22 DIAGNOSIS — G4733 Obstructive sleep apnea (adult) (pediatric): Secondary | ICD-10-CM | POA: Diagnosis not present

## 2022-11-22 DIAGNOSIS — J449 Chronic obstructive pulmonary disease, unspecified: Secondary | ICD-10-CM | POA: Diagnosis not present

## 2022-11-22 DIAGNOSIS — J9611 Chronic respiratory failure with hypoxia: Secondary | ICD-10-CM

## 2022-11-22 DIAGNOSIS — Z9981 Dependence on supplemental oxygen: Secondary | ICD-10-CM | POA: Diagnosis not present

## 2022-11-22 NOTE — Telephone Encounter (Signed)
Patient states would like 3lb POC. Uses [facility] for oxygen. Patient phone number is [phone] and [phone].

## 2022-11-28 ENCOUNTER — Encounter: Payer: Self-pay | Admitting: Emergency Medicine

## 2022-11-28 DIAGNOSIS — J449 Chronic obstructive pulmonary disease, unspecified: Secondary | ICD-10-CM

## 2022-11-28 DIAGNOSIS — J849 Interstitial pulmonary disease, unspecified: Secondary | ICD-10-CM

## 2022-11-28 NOTE — Telephone Encounter (Signed)
Patient calling back states would like 3lb POC. Uses [facility] for oxygen. Patient phone number is [phone] and [phone].

## 2022-12-01 NOTE — Telephone Encounter (Signed)
CMA responses to patients mychart, "One thing you can do is try to reach out to the home care company that is over your oxygen to see if you could come in and look at their concentrators to see if there is one that is lighter-weight for you to use.   If they need [place] to place an order for this, we can always do that for you but I would try to reach out to them first to see what they recommend."

## 2022-12-01 NOTE — Telephone Encounter (Signed)
Patient calling again wanting to get POC, wants to know if she can call [facility] or [facility] directly. Informed patient she will need an order. Please advise on POC.

## 2022-12-04 NOTE — Telephone Encounter (Signed)
Note from [facility]: Patient has a 4.5 pound POC and is requesting a 3.3 pound in which we do not carry at this time. I explained to patient we have M6 tanks that may weigh less. She wasn't interested in those at this time.  New order placed.

## 2022-12-04 NOTE — Telephone Encounter (Signed)
Updated order placed.

## 2022-12-04 NOTE — Addendum Note (Signed)
Addended by: Judd Gaudier on: 12/04/2022 04:31 PM   Modules accepted: Orders

## 2022-12-11 ENCOUNTER — Telehealth: Payer: Self-pay | Admitting: Emergency Medicine

## 2022-12-11 NOTE — Progress Notes (Unsigned)
Cardiology Office Note:    Date:  12/12/2022   ID:  Judy White, DOB [birth year], MRN [id number]  PCP:  Marden Noble, MD  Cardiologist:  Lesleigh Noe, MD (Inactive)   Pt presents for follow up of HFpEF, PAF   History of Present Illness:    Judy White is a 82 y.o. female with a hx of  paroxysmal atrial fibrillation  (s/p ablation at [facility] by Dr Macon Large and then [facility] in 2016), CAD on CT scan (Ca score in April 2024), HL, HFpEF, ICH, HTN, OSA (on CPAP), DVT with PE, COPD and anxiety     She was previously followed by Mendel Ryder  Seen in Nov 2023     Since seen the patient has been doing OK  No palpitations   No CP  Breathing is stable   She does use O2   Has a portable unit to take    She notes very mild ankle edema       Past Medical History:  Diagnosis Date   Agoraphobia    Anxiety    Arthritis    "normal for the age; nothing gives me problems" (01/24/2016)   COPD (chronic obstructive pulmonary disease) (HCC)    "mild" (01/24/2016)   Depressive disorder, not elsewhere classified    DVT (deep venous thrombosis) (HCC) 10/08/2015   "they said the blood clot went from one of my legs to my lung"   History of hiatal hernia    "small one" (01/24/2016)   Hypercholesteremia    Hypertension    pt denies this hx on 01/24/2016   Myalgia and myositis, unspecified    Osteopenia    Overactive bladder    Persistent atrial fibrillation (HCC)    a. s/p Watchman implant 12/2014   Presence of Watchman left atrial appendage closure device    Pulmonary embolism (HCC) 10/08/2015   Sleep apnea    USES CPAP     Past Surgical History:  Procedure Laterality Date   APPENDECTOMY  1983   ATRIAL FIBRILLATION ABLATION  06/28/2017   PER PATIENT SUCCESSFUL ABLATION , CARDIO [facility] , LOV JANUARY 2020   BACK SURGERY     BRONCHOSCOPY  01/24/2016   CARDIAC CATHETERIZATION     >5 yrs   CATARACT EXTRACTION W/ INTRAOCULAR LENS  IMPLANT, BILATERAL Bilateral    CESAREAN SECTION   1967   LEFT ATRIAL APPENDAGE OCCLUSION N/A 12/31/2014   Procedure: LEFT ATRIAL APPENDAGE OCCLUSION;  Surgeon: Hillis Range, MD;  Location: [facility] INVASIVE CV LAB;  Service: Cardiovascular;  Laterality: N/A;   POSTERIOR FUSION LUMBAR SPINE Bilateral 07/2014   SHOULDER ARTHROSCOPY W/ ROTATOR CUFF REPAIR Left    SHOULDER SURGERY Right    "tendon broke; couldn't be repaired"   TEE WITHOUT CARDIOVERSION N/A 12/22/2014   Procedure: TRANSESOPHAGEAL ECHOCARDIOGRAM (TEE);  Surgeon: Chrystie Nose, MD;  Location: [facility] ENDOSCOPY;  Service: Cardiovascular;  Laterality: N/A;   TEE WITHOUT CARDIOVERSION N/A 02/16/2015   post Watchman TEE with good seal and no leak around device    TONSILLECTOMY     TOOTH EXTRACTION  05/2018   TOTAL ABDOMINAL HYSTERECTOMY     TOTAL KNEE ARTHROPLASTY Right 12/27/2018   Procedure: RIGHT TOTAL KNEE ARTHROPLASTY;  Surgeon: Kathryne Hitch, MD;  Location: [facility] ORS;  Service: Orthopedics;  Laterality: Right;   VIDEO BRONCHOSCOPY Bilateral 01/24/2016   Procedure: VIDEO BRONCHOSCOPY WITH FLUORO;  Surgeon: Leslye Peer, MD;  Location: [facility] ENDOSCOPY;  Service: Cardiopulmonary;  Laterality: Bilateral;  WISDOM TOOTH EXTRACTION      Current Medications: Current Meds  Medication Sig   acetaminophen (TYLENOL) 500 MG tablet Take 500 mg by mouth every 6 (six) hours as needed for moderate pain or headache.   albuterol (VENTOLIN HFA) 108 (90 Base) MCG/ACT inhaler Inhale 2 puffs into the lungs every 4 (four) hours as needed for wheezing or shortness of breath.   aspirin EC 81 MG tablet Take 81 mg by mouth 3 (three) times a week. Swallow whole. Pt take 1 tablet 81 mg on Mondays, Wednesdays, and Fridays.   Budeson-Glycopyrrol-Formoterol (BREZTRI AEROSPHERE) 160-9-4.8 MCG/ACT AERO Inhale 2 puffs into the lungs 2 (two) times daily.   Calcium-Vitamin D (CALTRATE 600 PLUS-VIT D PO) Take 1 tablet by mouth daily.    Cholecalciferol (VITAMIN D3) 2000 units TABS Take 2,000 Units by mouth daily.    clonazePAM (KLONOPIN) 1 MG tablet Take 1.5-2 mg by mouth daily.    FLUoxetine (PROZAC) 20 MG capsule Take 20 mg by mouth daily.    furosemide (LASIX) 20 MG tablet Take 1 tablet (20 mg total) by mouth daily.   metoprolol succinate (TOPROL-XL) 25 MG 24 hr tablet Take 12.5 mg by mouth every morning.   oxybutynin (DITROPAN-XL) 10 MG 24 hr tablet Take 10 mg by mouth daily.    Potassium 99 MG TABS Take 99 mg by mouth daily.   vitamin C (ASCORBIC ACID) 500 MG tablet Take 500 mg by mouth daily.   vitamin E 180 MG (400 UNITS) capsule Take 400 Units daily by mouth.   [DISCONTINUED] Budeson-Glycopyrrol-Formoterol (BREZTRI AEROSPHERE) 160-9-4.8 MCG/ACT AERO Inhale 2 puffs into the lungs 2 (two) times daily.   [DISCONTINUED] Evolocumab (REPATHA SURECLICK) 140 MG/ML SOAJ Every 2 wks.   [DISCONTINUED] gabapentin (NEURONTIN) 100 MG capsule Take 100 mg by mouth 3 (three) times daily.     Allergies:   Bupropion, Rosuvastatin, and Statins   Social History   Socioeconomic History   Marital status: Widowed    Spouse name: Not on file   Number of children: Not on file   Years of education: Not on file   Highest education level: Not on file  Occupational History   Not on file  Tobacco Use   Smoking status: Former    Current packs/day: 0.00    Average packs/day: 0.5 packs/day for 60.0 years (30.0 ttl pk-yrs)    Types: Cigarettes    Start date: 10/08/1955    Quit date: 10/08/2015    Years since quitting: 7.1   Smokeless tobacco: Never  Vaping Use   Vaping status: Never Used  Substance and Sexual Activity   Alcohol use: Yes    Alcohol/week: 0.0 standard drinks of alcohol    Comment: occ   Drug use: No   Sexual activity: Not on file  Other Topics Concern   Not on file  Social History Narrative   Not on file   Social Determinants of Health   Financial Resource Strain: Not on file  Food Insecurity: Not on file  Transportation Needs: Not on file  Physical Activity: Not on file  Stress: Not on  file  Social Connections: Not on file     Family History: The patient's family history includes Aneurysm in her father; Cancer in an other family member; Heart Problems in an other family member; Heart disease in her brother; Hypotension in her mother; Neuropathy in her brother. There is no history of Heart attack.  ROS:   Please see the history of present illness.  Appetite has been stable.  She is still mourning the loss of her husband.  All other systems reviewed and are negative.  EKGs/Labs/Other Studies Reviewed:    Ca score CT 18-Jun-2022 Non-cardiac: No significant non cardiac findings on limited lung and soft tissue windows. See separate report from [facility].   Ascending Aorta: Normal diameter 3.2 cm   Pericardium: Normal   Coronary arteries: Mild 3 vessel calcium   LM 0   LAD 149   LCX 1.23   RCA 0.954   Total 151   Patient also appears to have a Watchman device implanted in the LAA   IMPRESSION: Coronary calcium score of 151. This was 50 th percentile for age and sex matched control.   Charlton Haws    Echo  2017  - Left ventricle: The cavity size was normal. Systolic function was    normal. Wall motion was normal; there were no regional wall    motion abnormalities. The study is not technically sufficient to    allow evaluation of LV diastolic function.  - Aortic valve: Transvalvular velocity was within the normal range.    There was no stenosis. There was no regurgitation.  - Mitral valve: Transvalvular velocity was within the normal range.    There was no evidence for stenosis. There was no regurgitation.  - Right ventricle: The cavity size was normal. Wall thickness was    normal. Systolic function was normal.  - Atrial septum: No defect or patent foramen ovale was identified    by color flow Doppler.  - Tricuspid valve: There was trivial regurgitation.  - Pulmonary arteries: Systolic pressure was within the normal    range. PA  peak pressure: 28 mm Hg (S).   ---------------------   EKG   SR 62 bpm   Occasional PVC Recent Labs: No results found for requested labs within last 365 days.  Recent Lipid Panel    Component Value Date/Time   CHOL 185 12/28/2017 0927   TRIG 218 (H) 12/28/2017 0927   HDL 41 12/28/2017 0927   CHOLHDL 4.5 (H) 12/28/2017 0927   LDLCALC 100 (H) 12/28/2017 0927    Physical Exam:    VS:  BP 110/66   Pulse 62   Ht 5\' 5"  (1.651 m)   Wt 202 lb 3.2 oz (91.7 kg)   SpO2 95%   BMI 33.65 kg/m     Wt Readings from Last 3 Encounters:  12/12/22 202 lb 3.2 oz (91.7 kg)  06/12/22 201 lb 6.4 oz (91.4 kg)  01/04/22 197 lb (89.4 kg)     GEN: Overweight with BMI 32. No acute distress HEENT: Normal NECK: No JVD. CARDIAC: No murmur. RRR   Triv ankle edema  VASCULAR:  Normal Pulses. No bruits. RESPIRATORY:  Clear to auscultation  ABDOMEN: Soft, non-tender, non-distended, No pulsatile mass, MUSCULOSKELETAL: No deformity     PLAN:    1  PAF   Pt s/p afib ablation in 2019 at [facility]    She also has a Watchman in place   Denies palpitations   Follow   2  CAD   Ca score CT in 18-Jun-2022 score was     Plan for risk factor modification No symptoms of angina  3  HL     LDL in Sep 2024 was 112  HDL 35   Did not tolerate statins Was on Repatha in past  Had to stop due to cost   WIll look into getting it again  Review wth pharmacy   4  HTN  BP is controlled     5  LE edema  Really trivial   Can take an additional lasix as needed   Had vein stripping in past   Keep on support hose  6  Hx ICH  Occurred on anticoagulant  None in future    7  Hx DVT/PE   No on nticoagulant  Uses ASA q 3 days   8   COPD    Continue O2 as needed   9  OSA  Keep on CPAP     Medication Adjustments/Labs and Tests Ordered: Current medicines are reviewed at length with the patient today.  Concerns regarding medicines are outlined above.  Orders Placed This Encounter  Procedures   AMB Referral to [facility] Pharm-D    EKG 12-Lead   No orders of the defined types were placed in this encounter.   Patient Instructions  Medication Instructions:   *If you need a refill on your cardiac medications before your next appointment, please call your pharmacy*   Lab Work:  If you have labs (blood work) drawn today and your tests are completely normal, you will receive your results only by: MyChart Message (if you have MyChart) OR A paper copy in the mail If you have any lab test that is abnormal or we need to change your treatment, we will call you to review the results.   Testing/Procedures:    Follow-Up: At [facility], you and your health needs are our priority.  As part of our continuing mission to provide you with exceptional heart care, we have created designated Provider Care Teams.  These Care Teams include your primary Cardiologist (physician) and Advanced Practice Providers (APPs -  Physician Assistants and Nurse Practitioners) who all work together to provide you with the care you need, when you need it.  We recommend signing up for the patient portal called "MyChart".  Sign up information is provided on this After Visit Summary.  MyChart is used to connect with patients for Virtual Visits (Telemedicine).  Patients are able to view lab/test results, encounter notes, upcoming appointments, etc.  Non-urgent messages can be sent to your provider as well.   To learn more about what you can do with MyChart, go to [email or web address].    Your next appointment:   1 year(s)  Provider:   DR Dietrich Pates     Other Instructions  REFERRAL TO PHARM D FOR REPATHA      Signed, Dietrich Pates, MD  12/12/2022 8:49 AM    [facility]

## 2022-12-11 NOTE — Telephone Encounter (Signed)
Please check for 2 fax's [employer] has sent for an RX on 02 to be sent in. Any questions call [facility] @ [employer] @ [phone]

## 2022-12-12 ENCOUNTER — Encounter: Payer: Self-pay | Admitting: Internal Medicine

## 2022-12-12 ENCOUNTER — Ambulatory Visit: Payer: PPO | Attending: Internal Medicine | Admitting: Internal Medicine

## 2022-12-12 VITALS — BP 110/66 | HR 62 | Ht 65.0 in | Wt 202.2 lb

## 2022-12-12 DIAGNOSIS — E7800 Pure hypercholesterolemia, unspecified: Secondary | ICD-10-CM

## 2022-12-12 DIAGNOSIS — I10 Essential (primary) hypertension: Secondary | ICD-10-CM | POA: Diagnosis not present

## 2022-12-12 NOTE — Telephone Encounter (Signed)
[facility] checking on message for [employer]. [facility] phone number is [phone].

## 2022-12-12 NOTE — Patient Instructions (Signed)
Medication Instructions:   *If you need a refill on your cardiac medications before your next appointment, please call your pharmacy*   Lab Work:  If you have labs (blood work) drawn today and your tests are completely normal, you will receive your results only by: MyChart Message (if you have MyChart) OR A paper copy in the mail If you have any lab test that is abnormal or we need to change your treatment, we will call you to review the results.   Testing/Procedures:    Follow-Up: At [facility], you and your health needs are our priority.  As part of our continuing mission to provide you with exceptional heart care, we have created designated Provider Care Teams.  These Care Teams include your primary Cardiologist (physician) and Advanced Practice Providers (APPs -  Physician Assistants and Nurse Practitioners) who all work together to provide you with the care you need, when you need it.  We recommend signing up for the patient portal called "MyChart".  Sign up information is provided on this After Visit Summary.  MyChart is used to connect with patients for Virtual Visits (Telemedicine).  Patients are able to view lab/test results, encounter notes, upcoming appointments, etc.  Non-urgent messages can be sent to your provider as well.   To learn more about what you can do with MyChart, go to [email or web address].    Your next appointment:   1 year(s)  Provider:   DR Dietrich Pates     Other Instructions  REFERRAL TO PHARM D FOR REPATHA

## 2022-12-13 NOTE — Telephone Encounter (Signed)
[employer] calling again. See tel encounters. Several requests for this PT's 02 RX to be sent in. We sent in a referral but they need an actual RX. She has attempted several times for this.

## 2022-12-13 NOTE — Telephone Encounter (Signed)
She states that it has been faxed over several times.

## 2022-12-14 NOTE — Telephone Encounter (Signed)
[facility] checking to see if fax has been signed. [facility] phone number is [phone].

## 2022-12-14 NOTE — Telephone Encounter (Signed)
I have received this, awaiting signature from Dr.Byrum

## 2022-12-15 NOTE — Telephone Encounter (Signed)
Spoke with [place] with [employer] and she stated they have received the fax but the date looks like 12/14/2027 instead of 12/14/2022. They advised me to have the provider cross out the wrong date and put the correct date in as it should be 12/14/2022 because they have denied the request. Advised [facility] that the provider is not in the office today and if we can fix it she stated we can and fax it over to try again to get order approved. Spoke with [employer] and she is going to fax it to [employer] [phone].  Nothing else further needed.

## 2022-12-18 ENCOUNTER — Ambulatory Visit: Payer: PPO

## 2022-12-18 ENCOUNTER — Telehealth: Payer: Self-pay | Admitting: Emergency Medicine

## 2022-12-18 DIAGNOSIS — J449 Chronic obstructive pulmonary disease, unspecified: Secondary | ICD-10-CM | POA: Diagnosis not present

## 2022-12-18 DIAGNOSIS — G4733 Obstructive sleep apnea (adult) (pediatric): Secondary | ICD-10-CM | POA: Diagnosis not present

## 2022-12-18 NOTE — Progress Notes (Deleted)
Patient ID: Judy White                 DOB: 01-01-1941                    MRN: [id number]      HPI: Judy White is a 82 y.o. female patient referred to lipid clinic by Dr. Tenny Craw. PMH is significant for paroxysmal atrial fibrillation with successful ablation, Watchman implantation 2016, amiodarone therapy discontinued after ablation, diastolic heart failure, unable to use anticoagulation due to intracranial hemorrhage, essential hypertension, sleep apnea with CPAP compliance, history of DVT with pulmonary emboli, and anxiety disorder.  CAC in April 2024 151 (50th percentile).  Previously on Repatha but stopped due to cost.   TG very high- fasting labs? Zetia?   Reviewed options for lowering LDL cholesterol, including ezetimibe, PCSK-9 inhibitors, bempedoic acid and inclisiran.  Discussed mechanisms of action, dosing, side effects and potential decreases in LDL cholesterol.  Also reviewed cost information and potential options for patient assistance.   Current Medications:  Intolerances: rosuvastatin 5mg  every other day (leg pain), pravastatin 10mg   Risk Factors:  LDL-C goal:  ApoB goal:   Diet:   Exercise:   Family History:   Social History:   Labs: Lipid Panel  11/10/22 TC 220, HDL 35, LDL-C 112, TG 422    Component Value Date/Time   CHOL 185 12/28/2017 0927   TRIG 218 (H) 12/28/2017 0927   HDL 41 12/28/2017 0927   CHOLHDL 4.5 (H) 12/28/2017 0927   LDLCALC 100 (H) 12/28/2017 0927   LABVLDL 44 (H) 12/28/2017 0927    Past Medical History:  Diagnosis Date   Agoraphobia    Anxiety    Arthritis    "normal for the age; nothing gives me problems" (01/24/2016)   COPD (chronic obstructive pulmonary disease) (HCC)    "mild" (01/24/2016)   Depressive disorder, not elsewhere classified    DVT (deep venous thrombosis) (HCC) 10/08/2015   "they said the blood clot went from one of my legs to my lung"   History of hiatal hernia    "small one" (01/24/2016)   Hypercholesteremia     Hypertension    pt denies this hx on 01/24/2016   Myalgia and myositis, unspecified    Osteopenia    Overactive bladder    Persistent atrial fibrillation (HCC)    a. s/p Watchman implant 12/2014   Presence of Watchman left atrial appendage closure device    Pulmonary embolism (HCC) 10/08/2015   Sleep apnea    USES CPAP     Current Outpatient Medications on File Prior to Visit  Medication Sig Dispense Refill   acetaminophen (TYLENOL) 500 MG tablet Take 500 mg by mouth every 6 (six) hours as needed for moderate pain or headache.     albuterol (VENTOLIN HFA) 108 (90 Base) MCG/ACT inhaler Inhale 2 puffs into the lungs every 4 (four) hours as needed for wheezing or shortness of breath. 8 g 3   aspirin EC 81 MG tablet Take 81 mg by mouth 3 (three) times a week. Swallow whole. Pt take 1 tablet 81 mg on Mondays, Wednesdays, and Fridays.     Budeson-Glycopyrrol-Formoterol (BREZTRI AEROSPHERE) 160-9-4.8 MCG/ACT AERO Inhale 2 puffs into the lungs 2 (two) times daily. 32.1 g 1   Calcium-Vitamin D (CALTRATE 600 PLUS-VIT D PO) Take 1 tablet by mouth daily.      Cholecalciferol (VITAMIN D3) 2000 units TABS Take 2,000 Units by mouth daily.  clonazePAM (KLONOPIN) 1 MG tablet Take 1.5-2 mg by mouth daily.      FLUoxetine (PROZAC) 20 MG capsule Take 20 mg by mouth daily.      furosemide (LASIX) 20 MG tablet Take 1 tablet (20 mg total) by mouth daily. 90 tablet 1   metoprolol succinate (TOPROL-XL) 25 MG 24 hr tablet Take 12.5 mg by mouth every morning.     oxybutynin (DITROPAN-XL) 10 MG 24 hr tablet Take 10 mg by mouth daily.      Potassium 99 MG TABS Take 99 mg by mouth daily.     vitamin C (ASCORBIC ACID) 500 MG tablet Take 500 mg by mouth daily.     vitamin E 180 MG (400 UNITS) capsule Take 400 Units daily by mouth.     No current facility-administered medications on file prior to visit.    Allergies  Allergen Reactions   Bupropion     Other reaction(s): anxiety   Rosuvastatin     Myalgias on  5mg  every other day   Statins     Assessment/Plan:  1. Hyperlipidemia -  No problem-specific Assessment & Plan notes found for this encounter.    Thank you,  Olene Floss, Pharm.D, BCPS, CPP [facility] A Division of [facility] [address], [place], [place] [address]  Phone: [phone]; Fax: [phone]

## 2022-12-18 NOTE — Progress Notes (Unsigned)
Patient ID: Judy White                 DOB: 11/05/1940                    MRN: [id number]      HPI: Judy White is a 82 y.o. female patient referred to lipid clinic by Dr. Tenny Craw. PMH is significant for paroxysmal atrial fibrillation with successful ablation, Watchman implantation 2016, amiodarone therapy discontinued after ablation, diastolic heart failure, unable to use anticoagulation due to intracranial hemorrhage, essential hypertension, sleep apnea with CPAP compliance, history of DVT with pulmonary emboli, and anxiety disorder.  CAC in April 2024 151 (50th percentile).  Previously on Repatha but stopped due to cost.   TG very high- fasting labs? Zetia? Bempedoic acid?   Reviewed options for lowering LDL cholesterol, including ezetimibe, PCSK-9 inhibitors, bempedoic acid and inclisiran.  Discussed mechanisms of action, dosing, side effects and potential decreases in LDL cholesterol.  Also reviewed cost information and potential options for patient assistance.   Current Medications:  Intolerances: rosuvastatin 5mg  every other day (leg pain), pravastatin 10mg   Risk Factors: Elevated CAC score (151, April 2024), hypercholesterolemia (LDL 112, September 2024), former smoker (30-pack year history) LDL-C goal: <70 mg/dL ApoB goal:   Diet:   Exercise:   Family History:   Social History:   Labs: Lipid Panel   11/10/22 TC 220, HDL 35, LDL-C 112, TG 422     Component Value Date/Time   CHOL 185 12/28/2017 0927   TRIG 218 (H) 12/28/2017 0927   HDL 41 12/28/2017 0927   CHOLHDL 4.5 (H) 12/28/2017 0927   LDLCALC 100 (H) 12/28/2017 0927   LABVLDL 44 (H) 12/28/2017 0927    Past Medical History:  Diagnosis Date   Agoraphobia    Anxiety    Arthritis    "normal for the age; nothing gives me problems" (01/24/2016)   COPD (chronic obstructive pulmonary disease) (HCC)    "mild" (01/24/2016)   Depressive disorder, not elsewhere classified    DVT (deep venous thrombosis) (HCC)  10/08/2015   "they said the blood clot went from one of my legs to my lung"   History of hiatal hernia    "small one" (01/24/2016)   Hypercholesteremia    Hypertension    pt denies this hx on 01/24/2016   Myalgia and myositis, unspecified    Osteopenia    Overactive bladder    Persistent atrial fibrillation (HCC)    a. s/p Watchman implant 12/2014   Presence of Watchman left atrial appendage closure device    Pulmonary embolism (HCC) 10/08/2015   Sleep apnea    USES CPAP     Current Outpatient Medications on File Prior to Visit  Medication Sig Dispense Refill   acetaminophen (TYLENOL) 500 MG tablet Take 500 mg by mouth every 6 (six) hours as needed for moderate pain or headache.     albuterol (VENTOLIN HFA) 108 (90 Base) MCG/ACT inhaler Inhale 2 puffs into the lungs every 4 (four) hours as needed for wheezing or shortness of breath. 8 g 3   aspirin EC 81 MG tablet Take 81 mg by mouth 3 (three) times a week. Swallow whole. Pt take 1 tablet 81 mg on Mondays, Wednesdays, and Fridays.     Budeson-Glycopyrrol-Formoterol (BREZTRI AEROSPHERE) 160-9-4.8 MCG/ACT AERO Inhale 2 puffs into the lungs 2 (two) times daily. 32.1 g 1   Calcium-Vitamin D (CALTRATE 600 PLUS-VIT D PO) Take 1 tablet by mouth daily.  Cholecalciferol (VITAMIN D3) 2000 units TABS Take 2,000 Units by mouth daily.     clonazePAM (KLONOPIN) 1 MG tablet Take 1.5-2 mg by mouth daily.      FLUoxetine (PROZAC) 20 MG capsule Take 20 mg by mouth daily.      furosemide (LASIX) 20 MG tablet Take 1 tablet (20 mg total) by mouth daily. 90 tablet 1   metoprolol succinate (TOPROL-XL) 25 MG 24 hr tablet Take 12.5 mg by mouth every morning.     oxybutynin (DITROPAN-XL) 10 MG 24 hr tablet Take 10 mg by mouth daily.      Potassium 99 MG TABS Take 99 mg by mouth daily.     vitamin C (ASCORBIC ACID) 500 MG tablet Take 500 mg by mouth daily.     vitamin E 180 MG (400 UNITS) capsule Take 400 Units daily by mouth.     No current  facility-administered medications on file prior to visit.    Allergies  Allergen Reactions   Bupropion     Other reaction(s): anxiety   Rosuvastatin     Myalgias on 5mg  every other day   Statins     Assessment/Plan:  1. Hyperlipidemia -  No problem-specific Assessment & Plan notes found for this encounter.    Thank you,  Olene Floss, Pharm.D, BCPS, CPP [facility] A Division of [facility] [address], [place], [place] [address]  Phone: [phone]; Fax: [phone]

## 2022-12-18 NOTE — Telephone Encounter (Signed)
Judy White checking on fax sent. Judy White phone number is [phone].

## 2022-12-18 NOTE — Telephone Encounter (Signed)
Spoke to [employer] with [employer]. She is requesting update on Rx. According to 11/14 phone note, Rx was faxed to [facility].  Jeanice Lim, did you receive fax?

## 2022-12-20 DIAGNOSIS — J449 Chronic obstructive pulmonary disease, unspecified: Secondary | ICD-10-CM | POA: Diagnosis not present

## 2022-12-20 DIAGNOSIS — G4733 Obstructive sleep apnea (adult) (pediatric): Secondary | ICD-10-CM | POA: Diagnosis not present

## 2022-12-20 DIAGNOSIS — J9611 Chronic respiratory failure with hypoxia: Secondary | ICD-10-CM | POA: Diagnosis not present

## 2022-12-20 NOTE — Telephone Encounter (Signed)
[facility] checking on fax sent. [facility] phone number is [phone].

## 2022-12-20 NOTE — Telephone Encounter (Signed)
Judy White- did you get this fax? They are calling again for update, thanks!

## 2022-12-21 DIAGNOSIS — R69 Illness, unspecified: Secondary | ICD-10-CM | POA: Diagnosis not present

## 2022-12-21 NOTE — Telephone Encounter (Signed)
I have not received anything on this one.  I have reached out to [facility] to ask her refax this to me at [phone].  I had to leave her a VM to request this.  I also provided my direct phone number.

## 2022-12-22 DIAGNOSIS — G4733 Obstructive sleep apnea (adult) (pediatric): Secondary | ICD-10-CM | POA: Diagnosis not present

## 2023-01-08 ENCOUNTER — Other Ambulatory Visit (HOSPITAL_COMMUNITY): Payer: Self-pay

## 2023-01-10 ENCOUNTER — Other Ambulatory Visit (HOSPITAL_COMMUNITY): Payer: Self-pay

## 2023-01-10 MED ORDER — OXYBUTYNIN CHLORIDE ER 10 MG PO TB24
10.0000 mg | ORAL_TABLET | Freq: Every day | ORAL | 3 refills | Status: DC
  Filled 2023-01-27: qty 90, 90d supply, fill #0
  Filled 2023-04-26: qty 90, 90d supply, fill #1
  Filled 2023-07-24: qty 90, 90d supply, fill #2
  Filled 2023-10-19: qty 90, 90d supply, fill #3

## 2023-01-11 ENCOUNTER — Other Ambulatory Visit: Payer: Self-pay

## 2023-01-11 ENCOUNTER — Other Ambulatory Visit (HOSPITAL_COMMUNITY): Payer: Self-pay

## 2023-01-11 MED ORDER — CLONAZEPAM 1 MG PO TABS
ORAL_TABLET | ORAL | 5 refills | Status: AC
  Filled 2023-01-27 – 2023-02-16 (×2): qty 60, 30d supply, fill #0
  Filled 2023-03-29: qty 60, 30d supply, fill #1

## 2023-01-11 MED ORDER — BREZTRI AEROSPHERE 160-9-4.8 MCG/ACT IN AERO
2.0000 | INHALATION_SPRAY | Freq: Two times a day (BID) | RESPIRATORY_TRACT | 3 refills | Status: DC
  Filled 2023-01-11: qty 10.7, 30d supply, fill #0
  Filled 2023-04-05: qty 10.7, 30d supply, fill #1
  Filled 2023-05-05: qty 10.7, 30d supply, fill #2
  Filled 2023-07-14: qty 10.7, 30d supply, fill #3

## 2023-01-11 MED ORDER — METOPROLOL SUCCINATE ER 25 MG PO TB24
12.5000 mg | ORAL_TABLET | Freq: Every day | ORAL | 3 refills | Status: DC
  Filled 2023-01-27: qty 45, 90d supply, fill #0
  Filled 2023-04-26: qty 45, 90d supply, fill #1

## 2023-01-11 MED ORDER — FLUOXETINE HCL 20 MG PO CAPS
20.0000 mg | ORAL_CAPSULE | Freq: Every day | ORAL | 2 refills | Status: AC
  Filled 2023-01-11 – 2023-01-27 (×2): qty 90, 90d supply, fill #0
  Filled 2023-04-26: qty 90, 90d supply, fill #1
  Filled 2023-09-10: qty 90, 90d supply, fill #2

## 2023-01-11 MED ORDER — GABAPENTIN 100 MG PO CAPS
100.0000 mg | ORAL_CAPSULE | Freq: Three times a day (TID) | ORAL | 3 refills | Status: DC
  Filled 2023-01-11: qty 270, 90d supply, fill #0

## 2023-01-12 ENCOUNTER — Other Ambulatory Visit (HOSPITAL_COMMUNITY): Payer: Self-pay

## 2023-01-12 ENCOUNTER — Other Ambulatory Visit: Payer: Self-pay

## 2023-01-20 DIAGNOSIS — J9611 Chronic respiratory failure with hypoxia: Secondary | ICD-10-CM | POA: Diagnosis not present

## 2023-01-20 DIAGNOSIS — G4733 Obstructive sleep apnea (adult) (pediatric): Secondary | ICD-10-CM | POA: Diagnosis not present

## 2023-01-20 DIAGNOSIS — J449 Chronic obstructive pulmonary disease, unspecified: Secondary | ICD-10-CM | POA: Diagnosis not present

## 2023-01-22 DIAGNOSIS — G4733 Obstructive sleep apnea (adult) (pediatric): Secondary | ICD-10-CM | POA: Diagnosis not present

## 2023-01-27 ENCOUNTER — Other Ambulatory Visit (HOSPITAL_COMMUNITY): Payer: Self-pay

## 2023-01-29 ENCOUNTER — Other Ambulatory Visit: Payer: Self-pay

## 2023-02-03 ENCOUNTER — Other Ambulatory Visit (HOSPITAL_COMMUNITY): Payer: Self-pay

## 2023-02-05 ENCOUNTER — Other Ambulatory Visit: Payer: Self-pay

## 2023-02-07 ENCOUNTER — Other Ambulatory Visit: Payer: Self-pay

## 2023-02-07 NOTE — Telephone Encounter (Signed)
Never received a response on this one.

## 2023-02-13 ENCOUNTER — Other Ambulatory Visit: Payer: Self-pay

## 2023-02-16 ENCOUNTER — Other Ambulatory Visit: Payer: Self-pay | Admitting: Internal Medicine

## 2023-02-16 ENCOUNTER — Other Ambulatory Visit: Payer: Self-pay

## 2023-02-16 ENCOUNTER — Other Ambulatory Visit (HOSPITAL_COMMUNITY): Payer: Self-pay

## 2023-02-16 MED ORDER — FUROSEMIDE 20 MG PO TABS
20.0000 mg | ORAL_TABLET | Freq: Every day | ORAL | 1 refills | Status: DC
  Filled 2023-02-16: qty 90, 90d supply, fill #0
  Filled 2023-04-26: qty 90, 90d supply, fill #1

## 2023-02-19 DIAGNOSIS — J449 Chronic obstructive pulmonary disease, unspecified: Secondary | ICD-10-CM | POA: Diagnosis not present

## 2023-02-19 DIAGNOSIS — J9611 Chronic respiratory failure with hypoxia: Secondary | ICD-10-CM | POA: Diagnosis not present

## 2023-02-19 DIAGNOSIS — G4733 Obstructive sleep apnea (adult) (pediatric): Secondary | ICD-10-CM | POA: Diagnosis not present

## 2023-02-21 DIAGNOSIS — G4733 Obstructive sleep apnea (adult) (pediatric): Secondary | ICD-10-CM | POA: Diagnosis not present

## 2023-03-15 ENCOUNTER — Emergency Department (HOSPITAL_COMMUNITY): Payer: PPO

## 2023-03-15 ENCOUNTER — Encounter (HOSPITAL_COMMUNITY): Payer: Self-pay

## 2023-03-15 ENCOUNTER — Inpatient Hospital Stay (HOSPITAL_COMMUNITY)
Admission: EM | Admit: 2023-03-15 | Discharge: 2023-03-21 | DRG: 486 | Disposition: A | Discharge status: Home / Self Care | Payer: PPO | Attending: Orthopaedic Surgery | Admitting: Orthopaedic Surgery

## 2023-03-15 ENCOUNTER — Ambulatory Visit: Payer: PPO | Admitting: Emergency Medicine

## 2023-03-15 ENCOUNTER — Other Ambulatory Visit: Payer: Self-pay

## 2023-03-15 DIAGNOSIS — Z86711 Personal history of pulmonary embolism: Secondary | ICD-10-CM | POA: Diagnosis not present

## 2023-03-15 DIAGNOSIS — E7800 Pure hypercholesterolemia, unspecified: Secondary | ICD-10-CM | POA: Diagnosis present

## 2023-03-15 DIAGNOSIS — Z90710 Acquired absence of both cervix and uterus: Secondary | ICD-10-CM

## 2023-03-15 DIAGNOSIS — Z86718 Personal history of other venous thrombosis and embolism: Secondary | ICD-10-CM

## 2023-03-15 DIAGNOSIS — M65161 Other infective (teno)synovitis, right knee: Secondary | ICD-10-CM | POA: Diagnosis not present

## 2023-03-15 DIAGNOSIS — Z820 Family history of epilepsy and other diseases of the nervous system: Secondary | ICD-10-CM

## 2023-03-15 DIAGNOSIS — I10 Essential (primary) hypertension: Secondary | ICD-10-CM | POA: Diagnosis present

## 2023-03-15 DIAGNOSIS — R001 Bradycardia, unspecified: Secondary | ICD-10-CM | POA: Diagnosis not present

## 2023-03-15 DIAGNOSIS — G4733 Obstructive sleep apnea (adult) (pediatric): Secondary | ICD-10-CM | POA: Diagnosis present

## 2023-03-15 DIAGNOSIS — Z888 Allergy status to other drugs, medicaments and biological substances status: Secondary | ICD-10-CM

## 2023-03-15 DIAGNOSIS — M25561 Pain in right knee: Principal | ICD-10-CM

## 2023-03-15 DIAGNOSIS — Z79899 Other long term (current) drug therapy: Secondary | ICD-10-CM

## 2023-03-15 DIAGNOSIS — J449 Chronic obstructive pulmonary disease, unspecified: Secondary | ICD-10-CM | POA: Diagnosis present

## 2023-03-15 DIAGNOSIS — Z981 Arthrodesis status: Secondary | ICD-10-CM | POA: Diagnosis not present

## 2023-03-15 DIAGNOSIS — Z8249 Family history of ischemic heart disease and other diseases of the circulatory system: Secondary | ICD-10-CM | POA: Diagnosis not present

## 2023-03-15 DIAGNOSIS — F32A Depression, unspecified: Secondary | ICD-10-CM | POA: Diagnosis present

## 2023-03-15 DIAGNOSIS — M009 Pyogenic arthritis, unspecified: Secondary | ICD-10-CM | POA: Diagnosis present

## 2023-03-15 DIAGNOSIS — Z7951 Long term (current) use of inhaled steroids: Secondary | ICD-10-CM

## 2023-03-15 DIAGNOSIS — T84092A Other mechanical complication of internal right knee prosthesis, initial encounter: Secondary | ICD-10-CM | POA: Diagnosis not present

## 2023-03-15 DIAGNOSIS — Z87891 Personal history of nicotine dependence: Secondary | ICD-10-CM

## 2023-03-15 DIAGNOSIS — Z96651 Presence of right artificial knee joint: Secondary | ICD-10-CM | POA: Diagnosis not present

## 2023-03-15 DIAGNOSIS — Z7982 Long term (current) use of aspirin: Secondary | ICD-10-CM

## 2023-03-15 DIAGNOSIS — I4819 Other persistent atrial fibrillation: Secondary | ICD-10-CM | POA: Diagnosis present

## 2023-03-15 DIAGNOSIS — T8453XA Infection and inflammatory reaction due to internal right knee prosthesis, initial encounter: Secondary | ICD-10-CM | POA: Diagnosis present

## 2023-03-15 DIAGNOSIS — Z95818 Presence of other cardiac implants and grafts: Secondary | ICD-10-CM

## 2023-03-15 DIAGNOSIS — M65961 Unspecified synovitis and tenosynovitis, right lower leg: Secondary | ICD-10-CM | POA: Insufficient documentation

## 2023-03-15 DIAGNOSIS — F419 Anxiety disorder, unspecified: Secondary | ICD-10-CM | POA: Diagnosis present

## 2023-03-15 DIAGNOSIS — G8918 Other acute postprocedural pain: Secondary | ICD-10-CM | POA: Diagnosis not present

## 2023-03-15 LAB — SYNOVIAL CELL COUNT + DIFF, W/ CRYSTALS
Crystals, Fluid: NONE SEEN
Crystals, Fluid: NONE SEEN
Eosinophils-Synovial: 0 % (ref 0–1)
Eosinophils-Synovial: 0 % (ref 0–1)
Lymphocytes-Synovial Fld: 2 % (ref 0–20)
Lymphocytes-Synovial Fld: 0 % (ref 0–20)
Monocyte-Macrophage-Synovial Fluid: 12 % — ABNORMAL LOW (ref 50–90)
Monocyte-Macrophage-Synovial Fluid: 8 % — ABNORMAL LOW (ref 50–90)
Neutrophil, Synovial: 86 % — ABNORMAL HIGH (ref 0–25)
Neutrophil, Synovial: 92 % — ABNORMAL HIGH (ref 0–25)
WBC, Synovial: 97970 /mm3 — ABNORMAL HIGH (ref 0–200)
WBC, Synovial: 107610 /mm3 — ABNORMAL HIGH (ref 0–200)

## 2023-03-15 LAB — BASIC METABOLIC PANEL
Anion gap: 11 (ref 5–15)
BUN: 22 mg/dL (ref 8–23)
CO2: 25 mmol/L (ref 22–32)
Calcium: 8.8 mg/dL — ABNORMAL LOW (ref 8.9–10.3)
Chloride: 101 mmol/L (ref 98–111)
Creatinine, Ser: 0.93 mg/dL (ref 0.44–1.00)
GFR, Estimated: >60 mL/min (ref >60)
Glucose, Bld: 145 mg/dL — ABNORMAL HIGH (ref 70–99)
Potassium: 4.1 mmol/L (ref 3.5–5.1)
Sodium: 137 mmol/L (ref 135–145)

## 2023-03-15 LAB — CBC WITH DIFFERENTIAL/PLATELET
Abs Immature Granulocytes: 0.23 10*3/uL — ABNORMAL HIGH (ref 0.00–0.07)
Basophils Absolute: 0.1 10*3/uL (ref 0.0–0.1)
Basophils Relative: 0 %
Eosinophils Absolute: 0.7 10*3/uL — ABNORMAL HIGH (ref 0.0–0.5)
Eosinophils Relative: 2 %
HCT: 43.1 % (ref 36.0–46.0)
Hemoglobin: 13.9 g/dL (ref 12.0–15.0)
Immature Granulocytes: 1 %
Lymphocytes Relative: 3 %
Lymphs Abs: 0.8 10*3/uL (ref 0.7–4.0)
MCH: 29.9 pg (ref 26.0–34.0)
MCHC: 32.3 g/dL (ref 30.0–36.0)
MCV: 92.7 fL (ref 80.0–100.0)
Monocytes Absolute: 1.3 10*3/uL — ABNORMAL HIGH (ref 0.1–1.0)
Monocytes Relative: 5 %
Neutro Abs: 25.4 10*3/uL — ABNORMAL HIGH (ref 1.7–7.7)
Neutrophils Relative %: 89 %
Platelets: 191 10*3/uL (ref 150–400)
RBC: 4.65 MIL/uL (ref 3.87–5.11)
RDW: 13 % (ref 11.5–15.5)
WBC: 28.4 10*3/uL — ABNORMAL HIGH (ref 4.0–10.5)
nRBC: 0 % (ref 0.0–0.2)

## 2023-03-15 LAB — C-REACTIVE PROTEIN: CRP: 6.8 mg/dL — ABNORMAL HIGH (ref <1.0)

## 2023-03-15 LAB — SEDIMENTATION RATE: Sed Rate: 0 mm/h (ref 0–22)

## 2023-03-15 MED ORDER — LIDOCAINE HCL (PF) 1 % IJ SOLN
10.0000 mL | Freq: Once | INTRAMUSCULAR | Status: AC
  Administered 2023-03-15: 10 mL
  Filled 2023-03-15: qty 30

## 2023-03-15 MED ORDER — METOPROLOL SUCCINATE ER 25 MG PO TB24: 12.5000 mg | ORAL_TABLET | ORAL | Status: DC

## 2023-03-15 MED ORDER — HYDROMORPHONE HCL 1 MG/ML IJ SOLN
0.5000 mg | INTRAMUSCULAR | Status: DC | PRN
Start: 2023-03-15 — End: 2023-03-21
  Administered 2023-03-16: 0.5 mg via INTRAVENOUS
  Administered 2023-03-17 – 2023-03-18 (×2): 1 mg via INTRAVENOUS
  Filled 2023-03-15 (×3): qty 1

## 2023-03-15 MED ORDER — GABAPENTIN 100 MG PO CAPS
100.0000 mg | ORAL_CAPSULE | Freq: Three times a day (TID) | ORAL | Status: DC
  Administered 2023-03-16 – 2023-03-21 (×15): 100 mg via ORAL
  Filled 2023-03-15 (×16): qty 1

## 2023-03-15 MED ORDER — OXYCODONE HCL 5 MG PO TABS
10.0000 mg | ORAL_TABLET | ORAL | Status: DC | PRN
  Administered 2023-03-17 (×3): 15 mg via ORAL
  Administered 2023-03-18: 10 mg via ORAL
  Administered 2023-03-18 – 2023-03-19 (×2): 15 mg via ORAL
  Administered 2023-03-19 – 2023-03-21 (×4): 10 mg via ORAL
  Filled 2023-03-15 (×2): qty 3
  Filled 2023-03-15: qty 2
  Filled 2023-03-15 (×2): qty 3
  Filled 2023-03-15: qty 2
  Filled 2023-03-15 (×2): qty 3
  Filled 2023-03-15: qty 2

## 2023-03-15 MED ORDER — CLONAZEPAM 1 MG PO TABS: 1.5000 mg | ORAL_TABLET | Freq: Every morning | ORAL | Status: DC

## 2023-03-15 MED ORDER — ONDANSETRON 4 MG PO TBDP
4.0000 mg | ORAL_TABLET | Freq: Once | ORAL | Status: AC
  Administered 2023-03-15: 4 mg via ORAL
  Filled 2023-03-15: qty 1

## 2023-03-15 MED ORDER — ACETAMINOPHEN 325 MG PO TABS
325.0000 mg | ORAL_TABLET | Freq: Four times a day (QID) | ORAL | Status: DC | PRN
  Administered 2023-03-15 – 2023-03-16 (×2): 650 mg via ORAL
  Administered 2023-03-17: 325 mg via ORAL
  Administered 2023-03-17 – 2023-03-19 (×3): 650 mg via ORAL
  Filled 2023-03-15 (×6): qty 2

## 2023-03-15 MED ORDER — POTASSIUM 99 MG PO TABS: 99.0000 mg | ORAL_TABLET | Freq: Every day | ORAL | Status: DC

## 2023-03-15 MED ORDER — VANCOMYCIN HCL IN DEXTROSE 1-5 GM/200ML-% IV SOLN
1000.0000 mg | INTRAVENOUS | Status: AC
  Administered 2023-03-15 (×2): 1000 mg via INTRAVENOUS
  Filled 2023-03-15 (×2): qty 200

## 2023-03-15 MED ORDER — FLUOXETINE HCL 20 MG PO CAPS
20.0000 mg | ORAL_CAPSULE | Freq: Every day | ORAL | Status: DC
  Filled 2023-03-15: qty 1

## 2023-03-15 MED ORDER — VITAMIN C 500 MG PO TABS
500.0000 mg | ORAL_TABLET | Freq: Every day | ORAL | Status: DC
Start: 2023-03-15 — End: 2023-03-21
  Administered 2023-03-15 – 2023-03-21 (×6): 500 mg via ORAL
  Filled 2023-03-15 (×6): qty 1

## 2023-03-15 MED ORDER — OXYBUTYNIN CHLORIDE ER 5 MG PO TB24
10.0000 mg | ORAL_TABLET | Freq: Every day | ORAL | Status: DC
Start: 2023-03-16 — End: 2023-03-21
  Administered 2023-03-16 – 2023-03-21 (×6): 10 mg via ORAL
  Filled 2023-03-15 (×2): qty 2
  Filled 2023-03-15: qty 1
  Filled 2023-03-15 (×3): qty 2

## 2023-03-15 MED ORDER — METOPROLOL SUCCINATE ER 25 MG PO TB24
12.5000 mg | ORAL_TABLET | Freq: Every day | ORAL | Status: DC
  Administered 2023-03-16 – 2023-03-21 (×4): 12.5 mg via ORAL
  Filled 2023-03-15 (×6): qty 1

## 2023-03-15 MED ORDER — OXYCODONE HCL 5 MG PO TABS
5.0000 mg | ORAL_TABLET | Freq: Once | ORAL | Status: AC
  Administered 2023-03-15: 5 mg via ORAL
  Filled 2023-03-15: qty 1

## 2023-03-15 MED ORDER — DIPHENHYDRAMINE HCL 12.5 MG/5ML PO ELIX: 12.5000 mg | ORAL_SOLUTION | ORAL | Status: DC | PRN

## 2023-03-15 MED ORDER — BUDESON-GLYCOPYRROL-FORMOTEROL 160-9-4.8 MCG/ACT IN AERO: 2.0000 | INHALATION_SPRAY | Freq: Two times a day (BID) | RESPIRATORY_TRACT | Status: DC

## 2023-03-15 MED ORDER — VITAMIN E 45 MG (100 UNIT) PO CAPS
400.0000 [IU] | ORAL_CAPSULE | Freq: Every day | ORAL | Status: DC
  Administered 2023-03-16 – 2023-03-21 (×6): 400 [IU] via ORAL
  Filled 2023-03-15 (×6): qty 4

## 2023-03-15 MED ORDER — CLONAZEPAM 0.5 MG PO TABS
1.5000 mg | ORAL_TABLET | Freq: Every day | ORAL | Status: DC
Start: 2023-03-15 — End: 2023-03-15
  Filled 2023-03-15: qty 3

## 2023-03-15 MED ORDER — SODIUM CHLORIDE 0.9 % IV SOLN: INTRAVENOUS | Status: DC

## 2023-03-15 MED ORDER — ACETAMINOPHEN 325 MG PO TABS
650.0000 mg | ORAL_TABLET | Freq: Once | ORAL | Status: AC
  Administered 2023-03-15: 650 mg via ORAL
  Filled 2023-03-15: qty 2

## 2023-03-15 MED ORDER — FLUOXETINE HCL 20 MG PO CAPS
20.0000 mg | ORAL_CAPSULE | Freq: Every day | ORAL | Status: DC
  Administered 2023-03-16 – 2023-03-21 (×6): 20 mg via ORAL
  Filled 2023-03-15 (×6): qty 1

## 2023-03-15 MED ORDER — HYDROCODONE-ACETAMINOPHEN 5-325 MG PO TABS
1.0000 | ORAL_TABLET | Freq: Once | ORAL | Status: AC
Start: 2023-03-15 — End: 2023-03-15
  Administered 2023-03-15: 1 via ORAL
  Filled 2023-03-15: qty 1

## 2023-03-15 MED ORDER — FUROSEMIDE 20 MG PO TABS
20.0000 mg | ORAL_TABLET | Freq: Every day | ORAL | Status: DC
Start: 2023-03-15 — End: 2023-03-21
  Administered 2023-03-17 – 2023-03-21 (×5): 20 mg via ORAL
  Filled 2023-03-15 (×7): qty 1

## 2023-03-15 MED ORDER — OXYCODONE HCL 5 MG PO TABS
5.0000 mg | ORAL_TABLET | ORAL | Status: DC | PRN
  Administered 2023-03-18 – 2023-03-20 (×5): 10 mg via ORAL
  Filled 2023-03-15 (×2): qty 2
  Filled 2023-03-15: qty 1
  Filled 2023-03-15 (×4): qty 2
  Filled 2023-03-15: qty 1

## 2023-03-15 NOTE — ED Triage Notes (Addendum)
BIBA from home c/o Right knee pain and unable to bear weight this am upon waking. Ambulatory at baseline. Denies fall/heavy lifting. 2lpm [place] at baseline 1000mg  tylenol PTA with relief.  Right knee replacement 4 years ago.

## 2023-03-15 NOTE — H&P (Signed)
Judy White is an 83 y.o. female.   Chief Complaint: Acute right knee pain and swelling HPI: The patient is an 83 year old female who went to bed late yesterday with acute right knee pain and swelling and no known injury.  She woke up today with the inability to bear weight on that right knee with continued pain and swelling.  We replaced her right knee with a primary knee replacement secondary to knee arthritis back in late 2020.  She has never had any issues with that right knee since then and denies any recent illnesses.  This came on acutely.  She denies any fever and chills.  We were able to aspirate fluid from her knee and this concerning for severe synovitis and potentially even infection.  Her x-rays showed normal knee and there is no redness on the outside of her knee but this is worrisome enough to have her admitted to the hospital today.  Her vital signs are stable.  Past Medical History:  Diagnosis Date   Agoraphobia    Anxiety    Arthritis    "normal for the age; nothing gives me problems" (01/24/2016)   COPD (chronic obstructive pulmonary disease) (HCC)    "mild" (01/24/2016)   Depressive disorder, not elsewhere classified    DVT (deep venous thrombosis) (HCC) 10/08/2015   "they said the blood clot went from one of my legs to my lung"   History of hiatal hernia    "small one" (01/24/2016)   Hypercholesteremia    Hypertension    pt denies this hx on 01/24/2016   Myalgia and myositis, unspecified    Osteopenia    Overactive bladder    Persistent atrial fibrillation (HCC)    a. s/p Watchman implant 12/2014   Presence of Watchman left atrial appendage closure device    Pulmonary embolism (HCC) 10/08/2015   Sleep apnea    USES CPAP     Past Surgical History:  Procedure Laterality Date   APPENDECTOMY  1983   ATRIAL FIBRILLATION ABLATION  06/28/2017   PER PATIENT SUCCESSFUL ABLATION , [facility] , LOV JANUARY 2020   BACK SURGERY     BRONCHOSCOPY   01/24/2016   CARDIAC CATHETERIZATION     >5 yrs   CATARACT EXTRACTION W/ INTRAOCULAR LENS  IMPLANT, BILATERAL Bilateral    CESAREAN SECTION  1967   LEFT ATRIAL APPENDAGE OCCLUSION N/A 12/31/2014   Procedure: LEFT ATRIAL APPENDAGE OCCLUSION;  Surgeon: Hillis Range, MD;  Location: [facility] INVASIVE CV LAB;  Service: Cardiovascular;  Laterality: N/A;   POSTERIOR FUSION LUMBAR SPINE Bilateral 07/2014   SHOULDER ARTHROSCOPY W/ ROTATOR CUFF REPAIR Left    SHOULDER SURGERY Right    "tendon broke; couldn't be repaired"   TEE WITHOUT CARDIOVERSION N/A 12/22/2014   Procedure: TRANSESOPHAGEAL ECHOCARDIOGRAM (TEE);  Surgeon: Chrystie Nose, MD;  Location: [facility] ENDOSCOPY;  Service: Cardiovascular;  Laterality: N/A;   TEE WITHOUT CARDIOVERSION N/A 02/16/2015   post Watchman TEE with good seal and no leak around device    TONSILLECTOMY     TOOTH EXTRACTION  05/2018   TOTAL ABDOMINAL HYSTERECTOMY     TOTAL KNEE ARTHROPLASTY Right 12/27/2018   Procedure: RIGHT TOTAL KNEE ARTHROPLASTY;  Surgeon: Kathryne Hitch, MD;  Location: [facility] ORS;  Service: Orthopedics;  Laterality: Right;   VIDEO BRONCHOSCOPY Bilateral 01/24/2016   Procedure: VIDEO BRONCHOSCOPY WITH FLUORO;  Surgeon: Leslye Peer, MD;  Location: [facility] ENDOSCOPY;  Service: Cardiopulmonary;  Laterality: Bilateral;   WISDOM TOOTH EXTRACTION  Family History  Problem Relation Age of Onset   Aneurysm Father    Hypotension Mother    Neuropathy Brother    Heart disease Brother    Cancer Other        maternal side   Heart Problems Other        paternal side   Heart attack Neg Hx    Social History:  reports that she quit smoking about 7 years ago. Her smoking use included cigarettes. She started smoking about 67 years ago. She has a 30 pack-year smoking history. She has never used smokeless tobacco. She reports current alcohol use. She reports that she does not use drugs.  Allergies:  Allergies  Allergen Reactions   Bupropion     Other  reaction(s): anxiety   Rosuvastatin     Myalgias on 5mg  every other day   Statins     (Not in a hospital admission)   Results for orders placed or performed during the hospital encounter of 03/15/23 (from the past 48 hours)  CBC with Differential     Status: Abnormal   Collection Time: 03/15/23 10:54 AM  Result Value Ref Range   WBC 28.4 (H) 4.0 - 10.5 K/uL   RBC 4.65 3.87 - 5.11 MIL/uL   Hemoglobin 13.9 12.0 - 15.0 g/dL   HCT 82.9 56.2 - 13.0 %   MCV 92.7 80.0 - 100.0 fL   MCH 29.9 26.0 - 34.0 pg   MCHC 32.3 30.0 - 36.0 g/dL   RDW 86.5 78.4 - 69.6 %   Platelets 191 150 - 400 K/uL   nRBC 0.0 0.0 - 0.2 %   Neutrophils Relative % 89 %   Neutro Abs 25.4 (H) 1.7 - 7.7 K/uL   Lymphocytes Relative 3 %   Lymphs Abs 0.8 0.7 - 4.0 K/uL   Monocytes Relative 5 %   Monocytes Absolute 1.3 (H) 0.1 - 1.0 K/uL   Eosinophils Relative 2 %   Eosinophils Absolute 0.7 (H) 0.0 - 0.5 K/uL   Basophils Relative 0 %   Basophils Absolute 0.1 0.0 - 0.1 K/uL   RBC Morphology MORPHOLOGY UNREMARKABLE    Smear Review MORPHOLOGY UNREMARKABLE    Immature Granulocytes 1 %   Abs Immature Granulocytes 0.23 (H) 0.00 - 0.07 K/uL    Comment: Performed at [facility], [address], [place], [place] [address]  Basic metabolic panel     Status: Abnormal   Collection Time: 03/15/23 10:54 AM  Result Value Ref Range   Sodium 137 135 - 145 mmol/L   Potassium 4.1 3.5 - 5.1 mmol/L   Chloride 101 98 - 111 mmol/L   CO2 25 22 - 32 mmol/L   Glucose, Bld 145 (H) 70 - 99 mg/dL    Comment: Glucose reference range applies only to samples taken after fasting for at least 8 hours.   BUN 22 8 - 23 mg/dL   Creatinine, Ser 4.13 0.44 - 1.00 mg/dL   Calcium 8.8 (L) 8.9 - 10.3 mg/dL   GFR, Estimated >24 >40 mL/min    Comment: (NOTE) Calculated using the CKD-EPI Creatinine Equation (2021)    Anion gap 11 5 - 15    Comment: Performed at [facility], [address], [place], [place]  [address]  Sedimentation rate     Status: None   Collection Time: 03/15/23 10:54 AM  Result Value Ref Range   Sed Rate 0 0 - 22 mm/hr    Comment: Performed at [facility], [address] [place], [place] [address]  C-reactive protein     Status: Abnormal   Collection Time: 03/15/23 10:54 AM  Result Value Ref Range   CRP 6.8 (H) <1.0 mg/dL    Comment: Performed at [facility], [address] [place], [place] [address]  Body fluid culture w Gram Stain     Status: None (Preliminary result)   Collection Time: 03/15/23 12:34 PM   Specimen: Synovium; Body Fluid  Result Value Ref Range   Specimen Description      SYNOVIAL Performed at [facility], [address], [place], [place] [address]    Special Requests      NONE Performed at [facility], [address], [place], [place] [address]    Gram Stain      MODERATE WBC PRESENT, PREDOMINANTLY PMN NO ORGANISMS SEEN Performed at [facility], [address] [place], [place] [address]    Culture PENDING    Report Status PENDING   Synovial cell count + diff, w/ crystals     Status: Abnormal   Collection Time: 03/15/23 12:35 PM  Result Value Ref Range   Color, Synovial YELLOW (A) YELLOW   Appearance-Synovial TURBID (A) CLEAR   Crystals, Fluid NO CRYSTALS SEEN    WBC, Synovial 97,970 (H) 0 - 200 /cu mm   Neutrophil, Synovial 86 (H) 0 - 25 %   Lymphocytes-Synovial Fld 2 0 - 20 %   Monocyte-Macrophage-Synovial Fluid 12 (L) 50 - 90 %   Eosinophils-Synovial 0 0 - 1 %    Comment: Performed at [facility], [address], [place], [place] [address]   DG Knee Complete 4 Views Right Result Date: 03/15/2023 CLINICAL DATA:  Right knee pain. EXAM: RIGHT KNEE - COMPLETE 4+ VIEW COMPARISON:  Right knee radiographs dated September 13, 2020. FINDINGS: Status post right total knee arthroplasty with appropriate alignment. The femoral and tibial components appear well  seated. No periprosthetic lucency identified. No acute fracture or dislocation. Small to moderate-sized knee joint effusion. Enthesopathy at the superior patellar pole. A 13 mm radiopaque structure projecting over the anteromedial distal thigh is presumably external to the patient. No significant focal soft tissue swelling. IMPRESSION: 1. Intact right total knee arthroplasty. No acute osseous abnormality. 2. Small to moderate-sized knee joint effusion. Electronically Signed   By: Hart Robinsons M.D.   On: 03/15/2023 09:42   X-rays today of the right knee show no worrisome findings.  The knee arthroplasty appears normal with no evidence of loosening.   Review of Systems  Blood pressure (!) 103/48, pulse 62, temperature 98.5 F (36.9 C), temperature source Oral, resp. rate 13, height 5\' 5"  (1.651 m), weight 91 kg, SpO2 98%. Physical Exam Vitals reviewed.  Constitutional:      Appearance: Normal appearance. She is normal weight.  HENT:     Head: Normocephalic and atraumatic.  Eyes:     Extraocular Movements: Extraocular movements intact.     Pupils: Pupils are equal, round, and reactive to light.  Cardiovascular:     Rate and Rhythm: Normal rate and regular rhythm.     Pulses: Normal pulses.  Pulmonary:     Effort: Pulmonary effort is normal.     Breath sounds: Normal breath sounds.  Abdominal:     Palpations: Abdomen is soft.  Musculoskeletal:     Cervical back: Normal range of motion and neck supple.     Right knee: Effusion and bony  tenderness present. Decreased range of motion.  Neurological:     Mental Status: She is alert and oriented to person, place, and time.  Psychiatric:        Behavior: Behavior normal.   I am able to slightly flex and extend her knee with some pain but is not severe.  There is no open wounds that I can see on or around her knee or other parts of her body.  There is no redness around the knee.  Her calf is soft.  The incision is  well-healed.  Assessment/Plan Acute right knee swelling and synovitis concerning for infection  This is come on all of a sudden over the last 24 hours with her right knee.  She denies any issues over the last several years with that knee.  She has had no recent illnesses either.  The fluid from her knee is concerning enough for infection that I have recommended an open arthrotomy with an incision and drainage, synovectomy and poly liner exchange while we await the culture results.  I did explain that she would likely be on 6 weeks of IV antibiotics and then we will go from there in terms of further treatment of her knee.  Hopefully this can be salvaged given the fact that she has not had any knee pain or any issues in until this last 24 hours.  Will admit her tonight and she can have a regular diet with n.p.o. after midnight tonight.  Kathryne Hitch, MD 03/15/2023, 5:13 PM

## 2023-03-15 NOTE — ED Provider Notes (Addendum)
[place] EMERGENCY DEPARTMENT AT [facility] Provider Note   CSN: [id number] Arrival date & time: 03/15/23  8469     History  Chief Complaint  Patient presents with   Knee Pain    Judy White is a 83 y.o. female.  83 year old female presents today for concern of right knee pain.  She states it started this morning upon waking up.  She went to sleep without any issues.  Has not been able to bear weight.  No fever or other concern.  Denies any injury.  The history is provided by the patient. No language interpreter was used.       Home Medications Prior to Admission medications   Medication Sig Start Date End Date Taking? Authorizing Provider  acetaminophen (TYLENOL) 500 MG tablet Take 500 mg by mouth every 6 (six) hours as needed for moderate pain or headache.    [provider]  albuterol (VENTOLIN HFA) 108 (90 Base) MCG/ACT inhaler Inhale 2 puffs into the lungs every 4 (four) hours as needed for wheezing or shortness of breath. 05/24/21   Leslye Peer, MD  aspirin EC 81 MG tablet Take 81 mg by mouth 3 (three) times a week. Swallow whole. Pt take 1 tablet 81 mg on Mondays, Wednesdays, and Fridays.    [provider]  Budeson-Glycopyrrol-Formoterol (BREZTRI AEROSPHERE) 160-9-4.8 MCG/ACT AERO Inhale 2 puffs into the lungs 2 (two) times daily. 01/11/23     Calcium-Vitamin D (CALTRATE 600 PLUS-VIT D PO) Take 1 tablet by mouth daily.     [provider]  Cholecalciferol (VITAMIN D3) 2000 units TABS Take 2,000 Units by mouth daily.    [provider]  clonazePAM (KLONOPIN) 1 MG tablet Take 1.5-2 mg by mouth daily.     [provider]  clonazePAM (KLONOPIN) 1 MG tablet Take 1.5 tablets (1.5 mg total) by mouth in the morning. May also take 0.5 tablets (0.5 mg total) daily as needed. 10/02/22   Plovsky, Earvin Hansen, MD  FLUoxetine (PROZAC) 20 MG capsule Take 20 mg by mouth daily.  04/11/16   [provider]  FLUoxetine (PROZAC)  20 MG capsule Take 1 capsule (20 mg total) by mouth daily with breakfast. 10/02/22   Archer Asa, MD  furosemide (LASIX) 20 MG tablet Take 1 tablet (20 mg total) by mouth daily. 02/16/23   Pricilla Riffle, MD  gabapentin (NEURONTIN) 100 MG capsule Take 1 capsule (100 mg total) by mouth 3 (three) times daily. 04/16/22     metoprolol succinate (TOPROL-XL) 25 MG 24 hr tablet Take 12.5 mg by mouth every morning. 11/28/18   [provider]  metoprolol succinate (TOPROL-XL) 25 MG 24 hr tablet Take 0.5 tablets (12.5 mg total) by mouth daily. 08/08/22     oxybutynin (DITROPAN-XL) 10 MG 24 hr tablet Take 10 mg by mouth daily.  03/22/13   [provider]  oxybutynin (DITROPAN-XL) 10 MG 24 hr tablet Take 1 tablet (10 mg total) by mouth daily. 01/02/23     Potassium 99 MG TABS Take 99 mg by mouth daily.    [provider]  vitamin C (ASCORBIC ACID) 500 MG tablet Take 500 mg by mouth daily.    [provider]  vitamin E 180 MG (400 UNITS) capsule Take 400 Units daily by mouth.    [provider]      Allergies    Bupropion, Rosuvastatin, and Statins    Review of Systems   Review of Systems  Constitutional:  Negative  for fever.  Musculoskeletal:  Positive for arthralgias and joint swelling.  All other systems reviewed and are negative.   Physical Exam Updated Vital Signs BP (!) 103/53 (BP Location: Right Arm)   Pulse 68   Temp 98.7 F (37.1 C) (Oral)   Resp 12   Ht 5\' 5"  (1.651 m)   Wt 91 kg   SpO2 96%   BMI 33.38 kg/m  Physical Exam Vitals and nursing note reviewed.  Constitutional:      General: She is not in acute distress.    Appearance: Normal appearance. She is not ill-appearing.  HENT:     Head: Normocephalic and atraumatic.     Nose: Nose normal.  Eyes:     Conjunctiva/sclera: Conjunctivae normal.  Cardiovascular:     Rate and Rhythm: Normal rate and regular rhythm.  Pulmonary:     Effort: Pulmonary effort is normal. No respiratory  distress.  Musculoskeletal:        General: No deformity. Normal range of motion.     Comments: Good range of motion bilateral knees.  Right ankle without tenderness to palpation.  Neurovascularly intact in the right lower extremity.  Minimal swelling noted over the right knee.  Mild warmth noted to the right knee.  No overlying erythema.  Skin:    Findings: No rash.  Neurological:     Mental Status: She is alert.     ED Results / Procedures / Treatments   Labs (all labs ordered are listed, but only abnormal results are displayed) Labs Reviewed  SYNOVIAL CELL COUNT + DIFF, W/ CRYSTALS - Abnormal; Notable for the following components:      Result Value   Color, Synovial YELLOW (*)    Appearance-Synovial TURBID (*)    WBC, Synovial 97,970 (*)    Neutrophil, Synovial 86 (*)    Monocyte-Macrophage-Synovial Fluid 12 (*)    All other components within normal limits  CBC WITH DIFFERENTIAL/PLATELET - Abnormal; Notable for the following components:   WBC 28.4 (*)    Neutro Abs 25.4 (*)    Monocytes Absolute 1.3 (*)    Eosinophils Absolute 0.7 (*)    Abs Immature Granulocytes 0.23 (*)    All other components within normal limits  BASIC METABOLIC PANEL - Abnormal; Notable for the following components:   Glucose, Bld 145 (*)    Calcium 8.8 (*)    All other components within normal limits  C-REACTIVE PROTEIN - Abnormal; Notable for the following components:   CRP 6.8 (*)    All other components within normal limits  BODY FLUID CULTURE W GRAM STAIN  CULTURE, BLOOD (ROUTINE X 2)  CULTURE, BLOOD (ROUTINE X 2)  SEDIMENTATION RATE  GLUCOSE, BODY FLUID OTHER            PROTEIN, BODY FLUID (OTHER)  URIC ACID, BODY FLUID    EKG None  Radiology DG Knee Complete 4 Views Right Result Date: 03/15/2023 CLINICAL DATA:  Right knee pain. EXAM: RIGHT KNEE - COMPLETE 4+ VIEW COMPARISON:  Right knee radiographs dated September 13, 2020. FINDINGS: Status post right total knee arthroplasty with  appropriate alignment. The femoral and tibial components appear well seated. No periprosthetic lucency identified. No acute fracture or dislocation. Small to moderate-sized knee joint effusion. Enthesopathy at the superior patellar pole. A 13 mm radiopaque structure projecting over the anteromedial distal thigh is presumably external to the patient. No significant focal soft tissue swelling. IMPRESSION: 1. Intact right total knee arthroplasty. No acute osseous abnormality. 2. Small to  moderate-sized knee joint effusion. Electronically Signed   By: Hart Robinsons M.D.   On: 03/15/2023 09:42    Procedures .Joint Aspiration/Arthrocentesis  Date/Time: 03/15/2023 3:03 PM  Performed by: [name], PA-C Authorized by: [name], PA-C   Consent:    Consent obtained:  Verbal   Consent given by:  Patient   Risks, benefits, and alternatives were discussed: yes     Risks discussed:  Bleeding, infection, pain and incomplete drainage   Alternatives discussed:  No treatment, delayed treatment and alternative treatment Universal protocol:    Procedure explained and questions answered to patient or proxy's satisfaction: yes     Relevant documents present and verified: yes     Test results available: yes     Imaging studies available: yes     Site/side marked: yes     Patient identity confirmed:  Verbally with patient and arm band Location:    Location:  Knee   Knee:  R knee Anesthesia:    Anesthesia method:  Local infiltration   Local anesthetic:  Lidocaine 1% w/o epi Procedure details:    Preparation: Patient was prepped and draped in usual sterile fashion     Needle gauge: 21g.   Approach:  Medial   Aspirate amount:  18cc   Aspirate characteristics:  Cloudy and yellow Post-procedure details:    Procedure completion:  Tolerated well, no immediate complications     Medications Ordered in ED Medications  vancomycin (VANCOCIN) IVPB 1000 mg/200 mL premix (has no administration in time range)   lidocaine (PF) (XYLOCAINE) 1 % injection 10 mL (10 mLs Other Given 03/15/23 1049)  HYDROcodone-acetaminophen (NORCO/VICODIN) 5-325 MG per tablet 1 tablet (1 tablet Oral Given 03/15/23 1049)  ondansetron (ZOFRAN-ODT) disintegrating tablet 4 mg (4 mg Oral Given 03/15/23 1049)    ED Course/ Medical Decision Making/ A&P                                 Medical Decision Making Amount and/or Complexity of Data Reviewed Labs: ordered. Radiology: ordered.  Risk OTC drugs. Prescription drug management.   83 year old female presents today for concern of knee pain and inability to bear weight.  Neurovascularly intact.  Mild warmth to the joint.  Given this is atraumatic in nature I did offer arthrocentesis which patient was agreeable with.  Son is at bedside.  Arthrocentesis performed.  Obtained 18 cc of cloudy yellow fluid.  CBC shows leukocytosis of 20,000.  No anemia.  Does have a left shift.  BMP without acute concern.  Synovial fluid shows turbid appearing synovial fluid with 97,000 WBCs.  Discussed with orthopedics.  Dr. Magnus Ivan will evaluate patient after clinic.  Patient has been evaluated by Earney Hamburg, PA with Ortho.  States that at this point we are waiting for Dr. Magnus Ivan to evaluate patient.  Recommends holding off on admission at this time.  In the meantime I have ordered blood cultures and dose of vancomycin.  Patient and son are aware of plan.  Right knee x-ray shows no acute bony abnormality but does have mild to moderate joint effusion.  Concern for septic arthritis.  Pending Ortho eval for further recs.  Signed out to oncoming provider pending Ortho eval.  Final Clinical Impression(s) / ED Diagnoses Final diagnoses:  Acute pain of right knee    Rx / DC Orders ED Discharge Orders     None         Karie Mainland,  Idalis Hoelting, PA-C 03/15/23 1507    [name], PA-C 03/15/23 1509    Royanne Foots, DO 03/20/23 1154

## 2023-03-15 NOTE — Progress Notes (Signed)
ED Pharmacy Antibiotic Sign Off An antibiotic consult was received from an ED provider for vancomycin per pharmacy dosing for septic joint. A chart review was completed to assess appropriateness.   The following one time order(s) were placed:  Vancomycin 1 gm IV x 1 doses to make total dose = 2 grams  Further antibiotic and/or antibiotic pharmacy consults should be ordered by the admitting provider if indicated.   Thank you for allowing pharmacy to be a part of this patient's care.   Herby Abraham, Pharm.D Use secure chat for questions 03/15/2023 2:50 PM Clinical Pharmacist 03/15/23 2:50 PM

## 2023-03-15 NOTE — ED Provider Notes (Signed)
  Physical Exam  BP (!) 103/48 (BP Location: Left Arm)   Pulse 62   Temp 98.5 F (36.9 C) (Oral)   Resp 13   Ht 5\' 5"  (1.651 m)   Wt 91 kg   SpO2 98%   BMI 33.38 kg/m   Physical Exam Vitals and nursing note reviewed.  Constitutional:      General: She is not in acute distress.    Appearance: Normal appearance.  HENT:     Head: Normocephalic and atraumatic.  Eyes:     Extraocular Movements: Extraocular movements intact.     Conjunctiva/sclera: Conjunctivae normal.  Cardiovascular:     Rate and Rhythm: Normal rate and regular rhythm.  Pulmonary:     Effort: Pulmonary effort is normal. No respiratory distress.  Abdominal:     General: Abdomen is flat.     Palpations: Abdomen is soft.  Skin:    General: Skin is warm and dry.  Neurological:     General: No focal deficit present.     Mental Status: She is alert. Mental status is at baseline.  Psychiatric:        Mood and Affect: Mood normal.     Procedures  Procedures  ED Course / MDM   Clinical Course as of 03/15/23 1755  Thu Mar 15, 2023  1516 Concern for septic arthritis. R knee arthritis.  Ordered vancomycin. Consulted with Althea Charon will see patient and told to hold off on admission.  [CB]    Clinical Course User Index [CB] Lunette Stands, PA-C   Medical Decision Making Amount and/or Complexity of Data Reviewed Labs: ordered. Radiology: ordered.  Risk OTC drugs. Prescription drug management. Decision regarding hospitalization.   Patient is an 83 year old female presents to the ED today complaining of the right knee pain.  Patient was originally seen by [name], PA-C.  Plan at time of handoff was to have orthopedic who had already been consulted, see the patient.  Patient appears to have a septic arthritis.  Patient was then seen by Dr. Magnus Ivan who has taken over patient care at this time.       Lunette Stands, [place] 03/15/23 1847    Benjiman Core, MD 03/15/23 [phone]

## 2023-03-15 NOTE — Consult Note (Signed)
Reason for Consult:Right knee pain Referring Physician: Estelle June Time called: 1327 Time at bedside: 1350   Judy White is an 83 y.o. female.  HPI: Judy White was in her usual state of health until this morning when she woke up with severe knee pain and was unable to get out of bed. She was brought to the ED and her knee was tapped. Orthopedic surgery was consulted for possible septic joint. She had TKA by Dr. Magnus Ivan in 2020. She denies prior similar episodes or antecedent event. She denies fevers, chills, sweats, N/V.  Past Medical History:  Diagnosis Date   Agoraphobia    Anxiety    Arthritis    "normal for the age; nothing gives me problems" (01/24/2016)   COPD (chronic obstructive pulmonary disease) (HCC)    "mild" (01/24/2016)   Depressive disorder, not elsewhere classified    DVT (deep venous thrombosis) (HCC) 10/08/2015   "they said the blood clot went from one of my legs to my lung"   History of hiatal hernia    "small one" (01/24/2016)   Hypercholesteremia    Hypertension    pt denies this hx on 01/24/2016   Myalgia and myositis, unspecified    Osteopenia    Overactive bladder    Persistent atrial fibrillation (HCC)    a. s/p Watchman implant 12/2014   Presence of Watchman left atrial appendage closure device    Pulmonary embolism (HCC) 10/08/2015   Sleep apnea    USES CPAP     Past Surgical History:  Procedure Laterality Date   APPENDECTOMY  1983   ATRIAL FIBRILLATION ABLATION  06/28/2017   PER PATIENT SUCCESSFUL ABLATION , CARDIO [facility] , LOV JANUARY 2020   BACK SURGERY     BRONCHOSCOPY  01/24/2016   CARDIAC CATHETERIZATION     >5 yrs   CATARACT EXTRACTION W/ INTRAOCULAR LENS  IMPLANT, BILATERAL Bilateral    CESAREAN SECTION  1967   LEFT ATRIAL APPENDAGE OCCLUSION N/A 12/31/2014   Procedure: LEFT ATRIAL APPENDAGE OCCLUSION;  Surgeon: Hillis Range, MD;  Location: [facility] INVASIVE CV LAB;  Service: Cardiovascular;  Laterality: N/A;   POSTERIOR  FUSION LUMBAR SPINE Bilateral 07/2014   SHOULDER ARTHROSCOPY W/ ROTATOR CUFF REPAIR Left    SHOULDER SURGERY Right    "tendon broke; couldn't be repaired"   TEE WITHOUT CARDIOVERSION N/A 12/22/2014   Procedure: TRANSESOPHAGEAL ECHOCARDIOGRAM (TEE);  Surgeon: Chrystie Nose, MD;  Location: [facility] ENDOSCOPY;  Service: Cardiovascular;  Laterality: N/A;   TEE WITHOUT CARDIOVERSION N/A 02/16/2015   post Watchman TEE with good seal and no leak around device    TONSILLECTOMY     TOOTH EXTRACTION  05/2018   TOTAL ABDOMINAL HYSTERECTOMY     TOTAL KNEE ARTHROPLASTY Right 12/27/2018   Procedure: RIGHT TOTAL KNEE ARTHROPLASTY;  Surgeon: Kathryne Hitch, MD;  Location: [facility] ORS;  Service: Orthopedics;  Laterality: Right;   VIDEO BRONCHOSCOPY Bilateral 01/24/2016   Procedure: VIDEO BRONCHOSCOPY WITH FLUORO;  Surgeon: Leslye Peer, MD;  Location: [facility] ENDOSCOPY;  Service: Cardiopulmonary;  Laterality: Bilateral;   WISDOM TOOTH EXTRACTION      Family History  Problem Relation Age of Onset   Aneurysm Father    Hypotension Mother    Neuropathy Brother    Heart disease Brother    Cancer Other        maternal side   Heart Problems Other        paternal side   Heart attack Neg Hx  Social History:  reports that she quit smoking about 7 years ago. Her smoking use included cigarettes. She started smoking about 67 years ago. She has a 30 pack-year smoking history. She has never used smokeless tobacco. She reports current alcohol use. She reports that she does not use drugs.  Allergies:  Allergies  Allergen Reactions   Bupropion     Other reaction(s): anxiety   Rosuvastatin     Myalgias on 5mg  every other day   Statins     Medications: I have reviewed the patient's current medications.  Results for orders placed or performed during the hospital encounter of 03/15/23 (from the past 48 hours)  CBC with Differential     Status: Abnormal   Collection Time: 03/15/23 10:54 AM  Result Value Ref  Range   WBC 28.4 (H) 4.0 - 10.5 K/uL   RBC 4.65 3.87 - 5.11 MIL/uL   Hemoglobin 13.9 12.0 - 15.0 g/dL   HCT 16.1 09.6 - 04.5 %   MCV 92.7 80.0 - 100.0 fL   MCH 29.9 26.0 - 34.0 pg   MCHC 32.3 30.0 - 36.0 g/dL   RDW 40.9 81.1 - 91.4 %   Platelets 191 150 - 400 K/uL   nRBC 0.0 0.0 - 0.2 %   Neutrophils Relative % 89 %   Neutro Abs 25.4 (H) 1.7 - 7.7 K/uL   Lymphocytes Relative 3 %   Lymphs Abs 0.8 0.7 - 4.0 K/uL   Monocytes Relative 5 %   Monocytes Absolute 1.3 (H) 0.1 - 1.0 K/uL   Eosinophils Relative 2 %   Eosinophils Absolute 0.7 (H) 0.0 - 0.5 K/uL   Basophils Relative 0 %   Basophils Absolute 0.1 0.0 - 0.1 K/uL   RBC Morphology MORPHOLOGY UNREMARKABLE    Smear Review MORPHOLOGY UNREMARKABLE    Immature Granulocytes 1 %   Abs Immature Granulocytes 0.23 (H) 0.00 - 0.07 K/uL    Comment: Performed at [facility], [address], [place], [place] [address]  Basic metabolic panel     Status: Abnormal   Collection Time: 03/15/23 10:54 AM  Result Value Ref Range   Sodium 137 135 - 145 mmol/L   Potassium 4.1 3.5 - 5.1 mmol/L   Chloride 101 98 - 111 mmol/L   CO2 25 22 - 32 mmol/L   Glucose, Bld 145 (H) 70 - 99 mg/dL    Comment: Glucose reference range applies only to samples taken after fasting for at least 8 hours.   BUN 22 8 - 23 mg/dL   Creatinine, Ser 6.21 0.44 - 1.00 mg/dL   Calcium 8.8 (L) 8.9 - 10.3 mg/dL   GFR, Estimated >30 >86 mL/min    Comment: (NOTE) Calculated using the CKD-EPI Creatinine Equation (2021)    Anion gap 11 5 - 15    Comment: Performed at [facility], [address], [place], [place] [address]  Sedimentation rate     Status: None   Collection Time: 03/15/23 10:54 AM  Result Value Ref Range   Sed Rate 0 0 - 22 mm/hr    Comment: Performed at [facility], [address], [place], [place] [address]  Synovial cell count + diff, w/ crystals     Status: Abnormal   Collection Time: 03/15/23 12:35  PM  Result Value Ref Range   Color, Synovial YELLOW (A) YELLOW   Appearance-Synovial TURBID (A) CLEAR   Crystals, Fluid NO CRYSTALS SEEN    WBC, Synovial 97,970 (H) 0 -  200 /cu mm   Neutrophil, Synovial 86 (H) 0 - 25 %   Lymphocytes-Synovial Fld 2 0 - 20 %   Monocyte-Macrophage-Synovial Fluid 12 (L) 50 - 90 %   Eosinophils-Synovial 0 0 - 1 %    Comment: Performed at [facility], [address] [place], [place] [address]    DG Knee Complete 4 Views Right Result Date: 03/15/2023 CLINICAL DATA:  Right knee pain. EXAM: RIGHT KNEE - COMPLETE 4+ VIEW COMPARISON:  Right knee radiographs dated September 13, 2020. FINDINGS: Status post right total knee arthroplasty with appropriate alignment. The femoral and tibial components appear well seated. No periprosthetic lucency identified. No acute fracture or dislocation. Small to moderate-sized knee joint effusion. Enthesopathy at the superior patellar pole. A 13 mm radiopaque structure projecting over the anteromedial distal thigh is presumably external to the patient. No significant focal soft tissue swelling. IMPRESSION: 1. Intact right total knee arthroplasty. No acute osseous abnormality. 2. Small to moderate-sized knee joint effusion. Electronically Signed   By: Hart Robinsons M.D.   On: 03/15/2023 09:42    Review of Systems  Constitutional:  Negative for chills, diaphoresis and fever.  HENT:  Negative for ear discharge, ear pain, hearing loss and tinnitus.   Eyes:  Negative for photophobia and pain.  Respiratory:  Negative for cough and shortness of breath.   Cardiovascular:  Negative for chest pain.  Gastrointestinal:  Negative for abdominal pain, nausea and vomiting.  Genitourinary:  Negative for dysuria, flank pain, frequency and urgency.  Musculoskeletal:  Positive for arthralgias (Right knee). Negative for back pain, myalgias and neck pain.  Neurological:  Negative for dizziness and headaches.  Hematological:  Does not  bruise/bleed easily.  Psychiatric/Behavioral:  The patient is not nervous/anxious.    Blood pressure (!) 103/53, pulse 68, temperature 98.7 F (37.1 C), temperature source Oral, resp. rate 12, height 5\' 5"  (1.651 m), weight 91 kg, SpO2 96%. Physical Exam Constitutional:      General: She is not in acute distress.    Appearance: She is well-developed. She is not diaphoretic.  HENT:     Head: Normocephalic and atraumatic.  Eyes:     General: No scleral icterus.       Right eye: No discharge.        Left eye: No discharge.     Conjunctiva/sclera: Conjunctivae normal.  Cardiovascular:     Rate and Rhythm: Normal rate and regular rhythm.  Pulmonary:     Effort: Pulmonary effort is normal. No respiratory distress.  Musculoskeletal:     Cervical back: Normal range of motion.     Comments: RLE No traumatic wounds, ecchymosis, or rash  Mild TTP, mod pain with AROM but able to range about 45 degrees, no pain with PROM through 90 degrees  No ankle effusion  Knee stable to varus/ valgus and anterior/posterior stress  Sens DPN, SPN, TN intact  Motor EHL, ext, flex, evers 5/5  DP 2+, PT 2+, No significant edema  Skin:    General: Skin is warm and dry.  Neurological:     Mental Status: She is alert.  Psychiatric:        Mood and Affect: Mood normal.        Behavior: Behavior normal.     Assessment/Plan: Right knee pain -- Exam reassuring but both serum and synovial fluid WBC counts worrisome. Dr. Magnus Ivan to assess personally around 1600.    Freeman Caldron, PA-C Orthopedic Surgery [phone] 03/15/2023, 2:23 PM

## 2023-03-15 NOTE — ED Provider Triage Note (Signed)
Emergency Medicine Provider Triage Evaluation Note  Judy White , a 83 y.o. female  was evaluated in triage.  Pt complains of right knee pain.  Endorses limited range of motion.  No injury.  Does have history of right knee replacement..  Review of Systems  Positive: As above Negative: As above  Physical Exam  BP (!) 103/53 (BP Location: Right Arm)   Pulse 68   Temp 98.7 F (37.1 C) (Oral)   Resp 12   Ht 5\' 5"  (1.651 m)   Wt 91 kg   SpO2 96%   BMI 33.38 kg/m  Gen:   Awake, no distress   Resp:  Normal effort  MSK:   Moves extremities without difficulty  Other:    Medical Decision Making  Medically screening exam initiated at 9:11 AM.  Appropriate orders placed.  Judy White was informed that the remainder of the evaluation will be completed by another provider, this initial triage assessment does not replace that evaluation, and the importance of remaining in the ED until their evaluation is complete.     [name], PA-C 03/15/23 [phone]

## 2023-03-16 ENCOUNTER — Encounter (HOSPITAL_COMMUNITY): Payer: Self-pay | Admitting: Orthopaedic Surgery

## 2023-03-16 ENCOUNTER — Encounter (HOSPITAL_COMMUNITY)
Admission: EM | Disposition: A | Discharge status: Home / Self Care | Payer: Self-pay | Source: Home / Self Care | Attending: Orthopaedic Surgery

## 2023-03-16 ENCOUNTER — Other Ambulatory Visit: Payer: Self-pay

## 2023-03-16 ENCOUNTER — Inpatient Hospital Stay (HOSPITAL_COMMUNITY): Payer: PPO | Admitting: Anesthesiology

## 2023-03-16 DIAGNOSIS — G4733 Obstructive sleep apnea (adult) (pediatric): Secondary | ICD-10-CM | POA: Diagnosis not present

## 2023-03-16 DIAGNOSIS — T84092A Other mechanical complication of internal right knee prosthesis, initial encounter: Secondary | ICD-10-CM | POA: Diagnosis not present

## 2023-03-16 DIAGNOSIS — J449 Chronic obstructive pulmonary disease, unspecified: Secondary | ICD-10-CM

## 2023-03-16 DIAGNOSIS — I10 Essential (primary) hypertension: Secondary | ICD-10-CM

## 2023-03-16 DIAGNOSIS — T8453XA Infection and inflammatory reaction due to internal right knee prosthesis, initial encounter: Secondary | ICD-10-CM

## 2023-03-16 DIAGNOSIS — Z96651 Presence of right artificial knee joint: Secondary | ICD-10-CM

## 2023-03-16 DIAGNOSIS — M65961 Unspecified synovitis and tenosynovitis, right lower leg: Secondary | ICD-10-CM | POA: Diagnosis not present

## 2023-03-16 HISTORY — PX: I & D KNEE WITH POLY EXCHANGE: SHX5024

## 2023-03-16 LAB — CBC
HCT: 39.2 % (ref 36.0–46.0)
Hemoglobin: 12.5 g/dL (ref 12.0–15.0)
MCH: 29.5 pg (ref 26.0–34.0)
MCHC: 31.9 g/dL (ref 30.0–36.0)
MCV: 92.5 fL (ref 80.0–100.0)
Platelets: 161 10*3/uL (ref 150–400)
RBC: 4.24 MIL/uL (ref 3.87–5.11)
RDW: 13.1 % (ref 11.5–15.5)
WBC: 18.8 10*3/uL — ABNORMAL HIGH (ref 4.0–10.5)
nRBC: 0 % (ref 0.0–0.2)

## 2023-03-16 LAB — SURGICAL PCR SCREEN
MRSA, PCR: NEGATIVE
Staphylococcus aureus: NEGATIVE

## 2023-03-16 SURGERY — IRRIGATION AND DEBRIDEMENT KNEE WITH POLY EXCHANGE
Anesthesia: Monitor Anesthesia Care | Site: Knee | Laterality: Right

## 2023-03-16 MED ORDER — METOCLOPRAMIDE HCL 5 MG/ML IJ SOLN: 5.0000 mg | Freq: Three times a day (TID) | INTRAMUSCULAR | Status: DC | PRN

## 2023-03-16 MED ORDER — VANCOMYCIN HCL 1250 MG/250ML IV SOLN: 1250.0000 mg | INTRAVENOUS | Status: DC

## 2023-03-16 MED ORDER — EPINEPHRINE PF 1 MG/ML IJ SOLN
INTRAMUSCULAR | Status: AC
  Filled 2023-03-16: qty 1

## 2023-03-16 MED ORDER — EPHEDRINE 5 MG/ML INJ
INTRAVENOUS | Status: AC
  Filled 2023-03-16: qty 5

## 2023-03-16 MED ORDER — FENTANYL CITRATE (PF) 100 MCG/2ML IJ SOLN
INTRAMUSCULAR | Status: AC
  Filled 2023-03-16: qty 2

## 2023-03-16 MED ORDER — BUPIVACAINE IN DEXTROSE 0.75-8.25 % IT SOLN
INTRATHECAL | Status: DC | PRN
  Administered 2023-03-16: 1.4 mL via INTRATHECAL

## 2023-03-16 MED ORDER — PROPOFOL 10 MG/ML IV BOLUS
INTRAVENOUS | Status: DC | PRN
  Administered 2023-03-16: 10 mg via INTRAVENOUS

## 2023-03-16 MED ORDER — ROPIVACAINE HCL 5 MG/ML IJ SOLN
INTRAMUSCULAR | Status: DC | PRN
  Administered 2023-03-16: 20 mL via PERINEURAL

## 2023-03-16 MED ORDER — FENTANYL CITRATE PF 50 MCG/ML IJ SOSY
PREFILLED_SYRINGE | INTRAMUSCULAR | Status: AC
  Filled 2023-03-16: qty 2

## 2023-03-16 MED ORDER — VANCOMYCIN HCL 1000 MG IV SOLR
INTRAVENOUS | Status: DC | PRN
  Administered 2023-03-16: 1000 mg via INTRAVENOUS

## 2023-03-16 MED ORDER — VANCOMYCIN HCL IN DEXTROSE 1-5 GM/200ML-% IV SOLN
INTRAVENOUS | Status: AC
  Filled 2023-03-16: qty 200

## 2023-03-16 MED ORDER — FENTANYL CITRATE PF 50 MCG/ML IJ SOSY
25.0000 ug | PREFILLED_SYRINGE | INTRAMUSCULAR | Status: DC | PRN
  Administered 2023-03-16 (×2): 50 ug via INTRAVENOUS

## 2023-03-16 MED ORDER — BUPIVACAINE-EPINEPHRINE (PF) 0.25% -1:200000 IJ SOLN
INTRAMUSCULAR | Status: DC | PRN
  Administered 2023-03-16: 30 mL

## 2023-03-16 MED ORDER — DOCUSATE SODIUM 100 MG PO CAPS
100.0000 mg | ORAL_CAPSULE | Freq: Two times a day (BID) | ORAL | Status: DC
  Administered 2023-03-16 – 2023-03-21 (×10): 100 mg via ORAL
  Filled 2023-03-16 (×10): qty 1

## 2023-03-16 MED ORDER — VANCOMYCIN HCL 1000 MG IV SOLR
INTRAVENOUS | Status: AC
  Filled 2023-03-16: qty 20

## 2023-03-16 MED ORDER — BUPIVACAINE HCL 0.25 % IJ SOLN
INTRAMUSCULAR | Status: AC
  Filled 2023-03-16: qty 1

## 2023-03-16 MED ORDER — PROPOFOL 1000 MG/100ML IV EMUL
INTRAVENOUS | Status: AC
  Filled 2023-03-16: qty 200

## 2023-03-16 MED ORDER — CLONAZEPAM 1 MG PO TABS
1.5000 mg | ORAL_TABLET | Freq: Every day | ORAL | Status: DC
  Administered 2023-03-16 – 2023-03-21 (×5): 1.5 mg via ORAL
  Filled 2023-03-16 (×5): qty 1

## 2023-03-16 MED ORDER — ORAL CARE MOUTH RINSE: 15.0000 mL | Freq: Once | OROMUCOSAL | Status: DC

## 2023-03-16 MED ORDER — ONDANSETRON HCL 4 MG/2ML IJ SOLN: 4.0000 mg | Freq: Four times a day (QID) | INTRAMUSCULAR | Status: DC | PRN

## 2023-03-16 MED ORDER — SODIUM CHLORIDE 0.9 % IV SOLN
INTRAVENOUS | Status: AC
Start: 2023-03-16 — End: 2023-03-17

## 2023-03-16 MED ORDER — MUPIROCIN 2 % EX OINT
1.0000 | TOPICAL_OINTMENT | Freq: Two times a day (BID) | CUTANEOUS | Status: DC
  Administered 2023-03-16 – 2023-03-21 (×9): 1 via NASAL
  Filled 2023-03-16 (×2): qty 22

## 2023-03-16 MED ORDER — OXYCODONE HCL 5 MG PO TABS: 5.0000 mg | ORAL_TABLET | Freq: Once | ORAL | Status: DC | PRN

## 2023-03-16 MED ORDER — EPHEDRINE SULFATE-NACL 50-0.9 MG/10ML-% IV SOSY
PREFILLED_SYRINGE | INTRAVENOUS | Status: DC | PRN
  Administered 2023-03-16: 10 mg via INTRAVENOUS
  Administered 2023-03-16: 5 mg via INTRAVENOUS

## 2023-03-16 MED ORDER — FENTANYL CITRATE (PF) 100 MCG/2ML IJ SOLN
INTRAMUSCULAR | Status: DC | PRN
  Administered 2023-03-16 (×2): 50 ug via INTRAVENOUS

## 2023-03-16 MED ORDER — LACTATED RINGERS IV SOLN: INTRAVENOUS | Status: DC

## 2023-03-16 MED ORDER — ACETAMINOPHEN 10 MG/ML IV SOLN
INTRAVENOUS | Status: AC
  Administered 2023-03-16: 1000 mg via INTRAVENOUS
  Filled 2023-03-16: qty 100

## 2023-03-16 MED ORDER — OXYCODONE HCL 5 MG/5ML PO SOLN: 5.0000 mg | Freq: Once | ORAL | Status: DC | PRN

## 2023-03-16 MED ORDER — FENTANYL CITRATE PF 50 MCG/ML IJ SOSY
25.0000 ug | PREFILLED_SYRINGE | Freq: Once | INTRAMUSCULAR | Status: DC
Start: 2023-03-16 — End: 2023-03-16

## 2023-03-16 MED ORDER — PHENOL 1.4 % MT LIQD
1.0000 | OROMUCOSAL | Status: DC | PRN
  Administered 2023-03-19: 1 via OROMUCOSAL
  Filled 2023-03-16: qty 177

## 2023-03-16 MED ORDER — ALBUTEROL SULFATE (2.5 MG/3ML) 0.083% IN NEBU: 3.0000 mL | INHALATION_SOLUTION | RESPIRATORY_TRACT | Status: DC | PRN

## 2023-03-16 MED ORDER — PHENYLEPHRINE HCL-NACL 20-0.9 MG/250ML-% IV SOLN
INTRAVENOUS | Status: DC | PRN
  Administered 2023-03-16: 30 ug/min via INTRAVENOUS

## 2023-03-16 MED ORDER — ONDANSETRON HCL 4 MG/2ML IJ SOLN: 4.0000 mg | Freq: Once | INTRAMUSCULAR | Status: DC | PRN

## 2023-03-16 MED ORDER — PROPOFOL 500 MG/50ML IV EMUL
INTRAVENOUS | Status: DC | PRN
  Administered 2023-03-16: 70 ug/kg/min via INTRAVENOUS

## 2023-03-16 MED ORDER — ASPIRIN 81 MG PO CHEW
81.0000 mg | CHEWABLE_TABLET | Freq: Two times a day (BID) | ORAL | Status: DC
  Administered 2023-03-16 – 2023-03-21 (×10): 81 mg via ORAL
  Filled 2023-03-16 (×10): qty 1

## 2023-03-16 MED ORDER — SODIUM CHLORIDE 0.9 % IR SOLN
Status: DC | PRN
  Administered 2023-03-16: 3000 mL

## 2023-03-16 MED ORDER — METOCLOPRAMIDE HCL 5 MG PO TABS: 5.0000 mg | ORAL_TABLET | Freq: Three times a day (TID) | ORAL | Status: DC | PRN

## 2023-03-16 MED ORDER — VANCOMYCIN HCL 1000 MG IV SOLR
INTRAVENOUS | Status: DC | PRN
  Administered 2023-03-16: 1000 mg via TOPICAL

## 2023-03-16 MED ORDER — MOMETASONE FURO-FORMOTEROL FUM 100-5 MCG/ACT IN AERO
2.0000 | INHALATION_SPRAY | Freq: Two times a day (BID) | RESPIRATORY_TRACT | Status: DC
  Administered 2023-03-17 – 2023-03-21 (×9): 2 via RESPIRATORY_TRACT
  Filled 2023-03-16 (×2): qty 8.8

## 2023-03-16 MED ORDER — STERILE WATER FOR IRRIGATION IR SOLN
Status: DC | PRN
  Administered 2023-03-16: 1000 mL

## 2023-03-16 MED ORDER — DEXAMETHASONE SODIUM PHOSPHATE 10 MG/ML IJ SOLN
INTRAMUSCULAR | Status: DC | PRN
  Administered 2023-03-16: 10 mg

## 2023-03-16 MED ORDER — ACETAMINOPHEN 10 MG/ML IV SOLN: 1000.0000 mg | Freq: Once | INTRAVENOUS | Status: DC | PRN

## 2023-03-16 MED ORDER — ONDANSETRON HCL 4 MG/2ML IJ SOLN
INTRAMUSCULAR | Status: DC | PRN
  Administered 2023-03-16: 4 mg via INTRAVENOUS

## 2023-03-16 MED ORDER — CHLORHEXIDINE GLUCONATE 0.12 % MT SOLN: 15.0000 mL | Freq: Once | OROMUCOSAL | Status: DC

## 2023-03-16 MED ORDER — 0.9 % SODIUM CHLORIDE (POUR BTL) OPTIME
TOPICAL | Status: DC | PRN
  Administered 2023-03-16: 1000 mL

## 2023-03-16 MED ORDER — UMECLIDINIUM BROMIDE 62.5 MCG/ACT IN AEPB
1.0000 | INHALATION_SPRAY | Freq: Every day | RESPIRATORY_TRACT | Status: DC
  Administered 2023-03-17 – 2023-03-21 (×5): 1 via RESPIRATORY_TRACT
  Filled 2023-03-16 (×2): qty 7

## 2023-03-16 MED ORDER — ONDANSETRON HCL 4 MG/2ML IJ SOLN
INTRAMUSCULAR | Status: AC
  Filled 2023-03-16: qty 2

## 2023-03-16 MED ORDER — MENTHOL 3 MG MT LOZG
1.0000 | LOZENGE | OROMUCOSAL | Status: DC | PRN
Start: 2023-03-16 — End: 2023-03-21

## 2023-03-16 MED ORDER — ONDANSETRON HCL 4 MG PO TABS
4.0000 mg | ORAL_TABLET | Freq: Four times a day (QID) | ORAL | Status: DC | PRN
Start: 2023-03-16 — End: 2023-03-21

## 2023-03-16 SURGICAL SUPPLY — 42 items
BAG COUNTER SPONGE SURGICOUNT (BAG) IMPLANT
BAG ZIPLOCK 12X15 (MISCELLANEOUS) ×2 IMPLANT
BEARIN INSERT TIBIAL SZ 3 11 (Insert) ×1 IMPLANT
BEARING INSERT TIBIAL SZ 3 11 (Insert) IMPLANT
BNDG ELASTIC 4INX 5YD STR LF (GAUZE/BANDAGES/DRESSINGS) ×2 IMPLANT
BNDG ELASTIC 6INX 5YD STR LF (GAUZE/BANDAGES/DRESSINGS) ×2 IMPLANT
BNDG ELASTIC 6X15 VLCR STRL LF (GAUZE/BANDAGES/DRESSINGS) IMPLANT
CNTNR URN SCR LID CUP LEK RST (MISCELLANEOUS) IMPLANT
COOLER ICEMAN CLASSIC (MISCELLANEOUS) IMPLANT
COVER SURGICAL LIGHT HANDLE (MISCELLANEOUS) ×2 IMPLANT
CUFF TRNQT CYL 34X4.125X (TOURNIQUET CUFF) ×2 IMPLANT
DRAPE U-SHAPE 47X51 STRL (DRAPES) ×2 IMPLANT
DRSG ADAPTIC 3X8 NADH LF (GAUZE/BANDAGES/DRESSINGS) ×2 IMPLANT
ELECT REM PT RETURN 15FT ADLT (MISCELLANEOUS) ×2 IMPLANT
GAUZE PAD ABD 8X10 STRL (GAUZE/BANDAGES/DRESSINGS) ×4 IMPLANT
GAUZE SPONGE 4X4 12PLY STRL (GAUZE/BANDAGES/DRESSINGS) ×4 IMPLANT
GAUZE XEROFORM 1X8 LF (GAUZE/BANDAGES/DRESSINGS) IMPLANT
GLOVE BIO SURGEON STRL SZ7.5 (GLOVE) ×2 IMPLANT
GLOVE BIOGEL PI IND STRL 8 (GLOVE) ×4 IMPLANT
GLOVE ECLIPSE 8.0 STRL XLNG CF (GLOVE) ×2 IMPLANT
GOWN STRL REUS W/ TWL XL LVL3 (GOWN DISPOSABLE) ×4 IMPLANT
IMMOBILIZER KNEE 20 (SOFTGOODS) IMPLANT
IMMOBILIZER KNEE 20 THIGH 36 (SOFTGOODS) IMPLANT
KIT TURNOVER KIT A (KITS) IMPLANT
NS IRRIG 1000ML POUR BTL (IV SOLUTION) ×2 IMPLANT
PACK TOTAL KNEE CUSTOM (KITS) ×2 IMPLANT
PAD COLD SHLDR WRAP-ON (PAD) IMPLANT
PADDING CAST ABS COTTON 6X4 NS (CAST SUPPLIES) IMPLANT
PROTECTOR NERVE ULNAR (MISCELLANEOUS) ×2 IMPLANT
SET HNDPC FAN SPRY TIP SCT (DISPOSABLE) ×2 IMPLANT
SET PAD KNEE POSITIONER (MISCELLANEOUS) IMPLANT
SOLUTION PRONTOSAN WOUND 350ML (IRRIGATION / IRRIGATOR) IMPLANT
SPIKE FLUID TRANSFER (MISCELLANEOUS) ×2 IMPLANT
STAPLER SKIN PROX 35W (STAPLE) IMPLANT
STRIP CLOSURE SKIN 1/2X4 (GAUZE/BANDAGES/DRESSINGS) ×4 IMPLANT
SUT MNCRL AB 4-0 PS2 18 (SUTURE) ×2 IMPLANT
SUT VIC AB 0 CT1 36 (SUTURE) IMPLANT
SUT VIC AB 1 CT1 36 (SUTURE) ×6 IMPLANT
SUT VIC AB 2-0 CT1 TAPERPNT 27 (SUTURE) ×6 IMPLANT
SWAB COLLECTION DEVICE MRSA (MISCELLANEOUS) ×2 IMPLANT
SWAB CULTURE ESWAB REG 1ML (MISCELLANEOUS) ×2 IMPLANT
TRAY FOLEY MTR SLVR 16FR STAT (SET/KITS/TRAYS/PACK) ×2 IMPLANT

## 2023-03-16 NOTE — Progress Notes (Signed)
Pharmacy Antibiotic Note  Judy White is a 83 y.o. female admitted on 03/15/2023 with R knee infection.  Pharmacy has been consulted for vancomycin dosing.  Plan: Vancomycin 1250 mg IV q36 hrs for eAUC 481.6 using SCr 0.93, Vd 0.5, Css min 9.9 First dose 36 hrs after 2 gm given in OR> to start 1/19 at 04 am  Height: 5\' 5"  (165.1 cm) Weight: 91 kg (200 lb 9.9 oz) IBW/kg (Calculated) : 57  Temp (24hrs), Avg:97.9 F (36.6 C), Min:97.5 F (36.4 C), Max:98.4 F (36.9 C)  Recent Labs  Lab 03/15/23 1054 03/16/23 0531  WBC 28.4* 18.8*  CREATININE 0.93  --     Estimated Creatinine Clearance: 50.2 mL/min (by C-G formula based on SCr of 0.93 mg/dL).    Allergies  Allergen Reactions   Bupropion     Other reaction(s): anxiety   Rosuvastatin     Myalgias on 5mg  every other day   Statins     Antimicrobials this admission: 1/16 Vanc 2 gm in ED x 1 @ 1533 1/17 Vanc 2 gm in OR after cultures @ 1629 Dose adjustments this admission:  Microbiology results: 1/17 MRSA/MSSA -/- 1/16 BCx2 ngtd 1/16 R knee synovial fluid: ngtd  Thank you for allowing pharmacy to be a part of this patient's care.  Herby Abraham, Pharm.D Use secure chat for questions 03/16/2023 7:47 PM

## 2023-03-16 NOTE — Op Note (Signed)
Operative Note  Date of operation: 03/16/2023 Preoperative diagnosis: Synovitis right knee with acute effusion and questionable infection of a right total knee arthroplasty (acute versus chronic) Postoperative diagnosis: Same  Procedure: #1 Open arthrotomy with synovectomy all 3 compartments right knee         #2 irrigation and debridement right knee with polyethylene liner exchange  Findings: Cloudy effusion right knee with no evidence of metallosis and surprisingly no significant chronically inflamed synovium; no evidence of loosening of the components; no polyethylene liner wear  Implants: Implant Name Type Inv. Item Serial No. Manufacturer Lot No. LRB No. Used Action  BEARIN INSERT TIBIAL SZ 3 11 - [id number] Insert BEARIN INSERT TIBIAL SZ 3 11  [employer] [id number] Right 1 Implanted     Surgeon: Vanita Panda. Magnus Ivan, MD Assistant: Rexene Edison, PA-C  Anesthesia: #1 right lower extremity regional block, #2 spinal, #3 local Antibiotics: IV vancomycin after soft tissue Gram stain and cultures obtained Tourniquet time: Less than 1 hour EBL: 50 cc Complications: None  Indications: The patient is an 83 year old female who underwent a right total knee arthroplasty in 2020 secondary to significant right knee arthritis.  She has done very well postoperatively.  In 2022 she did have some right knee pain but no evidence of infection.  This cleared up with physical therapy and anti-inflammatories and time.  A MRI in 2022 showed some synovitis in the knee but no worrisome features.  She essentially became pain-free with that right knee and has had no issues until 2 nights ago she developed acute swelling with her right knee and had difficulty putting weight on that right knee.  She denied any injury.  She denied any recent illnesses.  She denied any knee pain over the last several months.  She denied any fever and chills.  Her vital signs are stable.  She had had no redness of her knee and  the swelling had only happened in less than 24 hours before she presented to the emergency room yesterday.  We did aspirate fluid from her knee yesterday on 2 different occasions showing cloudy fluid.  Each of those fluid collection showed a large and abundant amount of white blood cells and surprisingly no organisms.  Her peripheral white blood cell count was 28,000 with a CRP of just over 6 and a sed rate of 0.  She was given a single dose of IV antibiotics at 1600 hrs. yesterday.  Today her white blood cell count was down to 18,000.  We have recommended an open arthrotomy of her knee with a synovectomy and assessment of the components and soft tissue.  We have recommended a poly liner exchange and an attempt to retain the components given the acute nature of what seems to be going on.  Her and her family agree with [place] proceeding with the surgery.  The risk and benefits of surgery been explained in detail as well and informed consent has been obtained.  Procedure description: After informed consent was obtained and the appropriate right knee was marked, anesthesia obtained a right lower extremity block in the holding room.  The patient was then brought to the operating room and set up on the operating table where spinal anesthesia was obtained.  She was placed in a supine position on the operating table and a Foley catheter was placed.  A nonsterile tractors placed around her upper right thigh and her right thigh, knee, leg and ankle were prepped and draped in DuraPrep and sterile drapes including  a sterile stockinette.  Of note there were still no noted wounds or redness about her knee at all and her incisions intact.  A timeout was called and she is advised correct patient correct right knee.  The tourniquet was inflated to 300 mm of pressure.  We then made a direct midline incision of patella and carried this proximally distally.  We dissected down knee joint and carried out a medial parapatellar arthrotomy  finding a large cloudy fluid collection once again.  We drained this fluid from the knee.  We then took an abundant amount of soft tissue from around the suprapatellar area and the synovium from all 3 compartments to send this for Gram stain and culture.  We then removed the previous polythene liner and found no wear of that liner.  We assessed the femoral and tibial components and neither of these showed any evidence of loosening.  There was no metallosis around the knee and even the synovium did not appear to have any chronic changes to this at all.  We then irrigated the knee with normal saline solution using pulsatile lavage placing 3 L of fluid through the knee.  Next did remove her previous polyethylene liner again prior to irrigating.  Next we decided to place a new polythene liner that was the same size of is her old liner.  After that we used an antibiotic irrigate solution to irrigate through the knee and let that sit in the knee joint for several minutes.  Finally we placed vancomycin powder around all aspects of the arthrotomy.  The tourniquet was let down and hemostasis was obtained electrocautery.  The arthrotomy was then closed with interrupted #1 Vicryl suture followed by 0 Vicryl close the deep tissue and 2-0 Vicryl to close the subcutaneous tissue.  The skin was closed staples.  Well-padded sterile dressings applied.  The patient was taken the recovery room in stable condition.  Rexene Edison PA-C did assist during the entire case and beginning the end and his assistance crucial and medically necessary for soft tissue management and retraction, helping guide implant placement and a layered closure of the wound.

## 2023-03-16 NOTE — Anesthesia Procedure Notes (Signed)
Anesthesia Regional Block: Adductor canal block   Pre-Anesthetic Checklist: , timeout performed,  Correct Patient, Correct Site, Correct Laterality,  Correct Procedure, Correct Position, site marked,  Risks and benefits discussed,  Surgical consent,  Pre-op evaluation,  At surgeon's request and post-op pain management  Laterality: Right  Prep: Maximum Sterile Barrier Precautions used, chloraprep       Needles:  Injection technique: Single-shot  Needle Type: Echogenic Needle      Needle Gauge: 20     Additional Needles:   Procedures:,,,, ultrasound used (permanent image in chart),,    Narrative:  Start time: 03/16/2023 3:18 PM End time: 03/16/2023 3:22 PM Injection made incrementally with aspirations every 5 mL.  Performed by: Personally  Anesthesiologist: Mariann Barter, MD

## 2023-03-16 NOTE — Interval H&P Note (Signed)
History and Physical Interval Note: The patient understands we are proceeding to surgery today to address her right knee effusion and pain and the concern about the potential for knee infection.  The plan is to perform an open synovectomy and washout the knee, exchange or poly in place temporary antibiotics in the knee as well.  The risk and benefits of surgery have been discussed in detail and informed consent has been obtained.  The right operative knee has been marked.  03/16/2023 2:46 PM  Judy White  has presented today for surgery, with the diagnosis of right knee failed implant.  The various methods of treatment have been discussed with the patient and family. After consideration of risks, benefits and other options for treatment, the patient has consented to  Procedure(s): IRRIGATION AND DEBRIDEMENT KNEE WITH POLY EXCHANGE (Right) as a surgical intervention.  The patient's history has been reviewed, patient examined, no change in status, stable for surgery.  I have reviewed the patient's chart and labs.  Questions were answered to the patient's satisfaction.     Kathryne Hitch

## 2023-03-16 NOTE — Anesthesia Preprocedure Evaluation (Signed)
Anesthesia Evaluation  Patient identified by MRN, date of birth, ID band Patient awake    Reviewed: Allergy & Precautions, NPO status , Patient's Chart, lab work & pertinent test results, reviewed documented beta blocker date and time   History of Anesthesia Complications Negative for: history of anesthetic complications  Airway Mallampati: II  TM Distance: >3 FB     Dental no notable dental hx.    Pulmonary sleep apnea and Continuous Positive Airway Pressure Ventilation , COPD, former smoker   breath sounds clear to auscultation       Cardiovascular hypertension, (-) CAD, (-) Past MI and (-) Cardiac Stents + dysrhythmias Atrial Fibrillation  Rhythm:Regular Rate:Normal     Neuro/Psych neg Seizures PSYCHIATRIC DISORDERS Anxiety Depression     Neuromuscular disease    GI/Hepatic hiatal hernia,neg GERD  ,,(+) neg Cirrhosis        Endo/Other    Renal/GU Renal disease     Musculoskeletal  (+) Arthritis ,    Abdominal   Peds  Hematology   Anesthesia Other Findings   Reproductive/Obstetrics                              Anesthesia Physical Anesthesia Plan  ASA: 2  Anesthesia Plan: Spinal and MAC   Post-op Pain Management: Regional block*   Induction: Intravenous  PONV Risk Score and Plan: 2 and Ondansetron  Airway Management Planned:   Additional Equipment:   Intra-op Plan:   Post-operative Plan:   Informed Consent: I have reviewed the patients History and Physical, chart, labs and discussed the procedure including the risks, benefits and alternatives for the proposed anesthesia with the patient or authorized representative who has indicated his/her understanding and acceptance.     Dental advisory given  Plan Discussed with: CRNA  Anesthesia Plan Comments:          Anesthesia Quick Evaluation

## 2023-03-16 NOTE — Transfer of Care (Signed)
Immediate Anesthesia Transfer of Care Note  Patient: Judy White  Procedure(s) Performed: IRRIGATION AND DEBRIDEMENT KNEE WITH POLY EXCHANGE (Right: Knee)  Patient Location: PACU  Anesthesia Type:General  Level of Consciousness: awake  Airway & Oxygen Therapy: Patient Spontanous Breathing  Post-op Assessment: Report given to RN  Post vital signs: Reviewed and stable  Last Vitals:  Vitals Value Taken Time  BP 107/62 03/16/23 1745  Temp    Pulse 69 03/16/23 1745  Resp 17 03/16/23 1745  SpO2 90 % 03/16/23 1745  Vitals shown include unfiled device data.  Last Pain:  Vitals:   03/16/23 1535  TempSrc:   PainSc: 0-No pain         Complications: No notable events documented.

## 2023-03-17 LAB — BASIC METABOLIC PANEL
Anion gap: 8 (ref 5–15)
BUN: 22 mg/dL (ref 8–23)
CO2: 25 mmol/L (ref 22–32)
Calcium: 8.4 mg/dL — ABNORMAL LOW (ref 8.9–10.3)
Chloride: 99 mmol/L (ref 98–111)
Creatinine, Ser: 0.58 mg/dL (ref 0.44–1.00)
GFR, Estimated: >60 mL/min (ref >60)
Glucose, Bld: 174 mg/dL — ABNORMAL HIGH (ref 70–99)
Potassium: 4.6 mmol/L (ref 3.5–5.1)
Sodium: 132 mmol/L — ABNORMAL LOW (ref 135–145)

## 2023-03-17 LAB — CBC
HCT: 36.8 % (ref 36.0–46.0)
Hemoglobin: 11.6 g/dL — ABNORMAL LOW (ref 12.0–15.0)
MCH: 29.5 pg (ref 26.0–34.0)
MCHC: 31.5 g/dL (ref 30.0–36.0)
MCV: 93.6 fL (ref 80.0–100.0)
Platelets: 142 10*3/uL — ABNORMAL LOW (ref 150–400)
RBC: 3.93 MIL/uL (ref 3.87–5.11)
RDW: 12.6 % (ref 11.5–15.5)
WBC: 16.7 10*3/uL — ABNORMAL HIGH (ref 4.0–10.5)
nRBC: 0 % (ref 0.0–0.2)

## 2023-03-17 LAB — SEDIMENTATION RATE: Sed Rate: 30 mm/h — ABNORMAL HIGH (ref 0–22)

## 2023-03-17 LAB — C-REACTIVE PROTEIN: CRP: 17.4 mg/dL — ABNORMAL HIGH (ref <1.0)

## 2023-03-17 MED ORDER — VANCOMYCIN HCL IN DEXTROSE 1-5 GM/200ML-% IV SOLN
1000.0000 mg | INTRAVENOUS | Status: DC
  Administered 2023-03-17 – 2023-03-20 (×4): 1000 mg via INTRAVENOUS
  Filled 2023-03-17 (×4): qty 200

## 2023-03-17 NOTE — Progress Notes (Signed)
Physical Therapy Treatment Patient Details Name: Judy White MRN: [id number] DOB: October 04, 1940 Today's Date: 03/17/2023   History of Present Illness Pt admitted from home 2* R knee synovitis with acute effusion and now s/p open arthrotomy  with synovectomy, I&D, and poly exchange.  Pt with hx of COPD, Afib s/p ablation, back sx and R TKR    PT Comments  Pt continues very cooperative and with noted progress with mobility - pt up to ambulate increased distance in hall, up to bathroom for toileting and hand hygiene at sink and with noted decrease in level of assist needed for most tasks.      If plan is discharge home, recommend the following: A little help with walking and/or transfers;A little help with bathing/dressing/bathroom;Assistance with cooking/housework;Assist for transportation;Help with stairs or ramp for entrance   Can travel by private vehicle        Equipment Recommendations  None recommended by PT    Recommendations for Other Services       Precautions / Restrictions Precautions Precautions: Fall Restrictions Weight Bearing Restrictions Per Provider Order: No     Mobility  Bed Mobility Overal bed mobility: Needs Assistance Bed Mobility: Sit to Supine     Supine to sit: Min assist, Mod assist Sit to supine: Min assist   General bed mobility comments: cues for sequence; min assist with R LE    Transfers Overall transfer level: Needs assistance Equipment used: Rolling walker (2 wheels) Transfers: Sit to/from Stand Sit to Stand: Min assist           General transfer comment: cues for LE management and use of UEs to self assist    Ambulation/Gait Ambulation/Gait assistance: Min assist Gait Distance (Feet): 140 Feet (and 15' x 2 to/from bathroom) Assistive device: Rolling walker (2 wheels) Gait Pattern/deviations: Step-to pattern, Decreased step length - right, Decreased step length - left, Shuffle, Trunk flexed Gait velocity: decr     General Gait  Details: cues for sequence, posture and position from [employer]             Wheelchair Mobility     Tilt Bed    Modified Rankin (Stroke Patients Only)       Balance Overall balance assessment: Needs assistance Sitting-balance support: No upper extremity supported, Feet supported Sitting balance-Leahy Scale: Good     Standing balance support: Single extremity supported Standing balance-Leahy Scale: Poor                              Cognition Arousal: Alert Behavior During Therapy: WFL for tasks assessed/performed Overall Cognitive Status: Within Functional Limits for tasks assessed                                          Exercises General Exercises - Lower Extremity Ankle Circles/Pumps: AROM, Both, 15 reps, Supine    General Comments        Pertinent Vitals/Pain Pain Assessment Pain Assessment: 0-10 Pain Score: 4  Pain Location: R knee Pain Descriptors / Indicators: Aching, Sore Pain Intervention(s): Limited activity within patient's tolerance, Monitored during session, Premedicated before session, Ice applied    Home Living Family/patient expects to be discharged to:: Private residence Living Arrangements: Alone Available Help at Discharge: Family;Other (Comment) ("we have to work on that") Type of Home: House Home Access: Stairs to  enter Entrance Stairs-Rails: None Entrance Stairs-Number of Steps: 1+1   Home Layout: Two level Home Equipment: Agricultural consultant (2 wheels);Rollator (4 wheels);Shower seat      Prior Function            PT Goals (current goals can now be found in the care plan section) Acute Rehab PT Goals Patient Stated Goal: Regain IND PT Goal Formulation: With patient Time For Goal Achievement: 03/30/23 Potential to Achieve Goals: Fair Progress towards PT goals: Progressing toward goals    Frequency    7X/week      PT Plan      Co-evaluation              AM-PAC PT "6 Clicks"  Mobility   Outcome Measure  Help needed turning from your back to your side while in a flat bed without using bedrails?: A Lot Help needed moving from lying on your back to sitting on the side of a flat bed without using bedrails?: A Lot Help needed moving to and from a bed to a chair (including a wheelchair)?: A Little Help needed standing up from a chair using your arms (e.g., wheelchair or bedside chair)?: A Little Help needed to walk in hospital room?: A Little Help needed climbing 3-5 steps with a railing? : A Lot 6 Click Score: 15    End of Session Equipment Utilized During Treatment: Gait belt Activity Tolerance: Patient tolerated treatment well Patient left: in bed;with call bell/phone within reach;with family/visitor present;with bed alarm set Nurse Communication: Mobility status PT Visit Diagnosis: Unsteadiness on feet (R26.81);Difficulty in walking, not elsewhere classified (R26.2);Pain Pain - Right/Left: Right Pain - part of body: Knee     Time: 6962-9528 PT Time Calculation (min) (ACUTE ONLY): 38 min  Charges:    $Gait Training: 8-22 mins $Therapeutic Activity: 8-22 mins PT General Charges $$ ACUTE PT VISIT: 1 Visit                     Mauro Kaufmann PT Acute Rehabilitation Services Pager [phone] Office [phone]    Amayiah Gosnell 03/17/2023, 3:47 PM

## 2023-03-17 NOTE — Plan of Care (Signed)

## 2023-03-17 NOTE — Evaluation (Signed)
Physical Therapy Evaluation Patient Details Name: Judy White MRN: [id number] DOB: Apr 30, 1940 Today's Date: 03/17/2023  History of Present Illness  Pt admitted from home 2* R knee synovitis with acute effusion and now s/p open arthrotomy  with synovectomy, I&D, and poly exchange.  Pt with hx of COPD, Afib s/p ablation, back sx and R TKR  Clinical Impression  Pt admitted as above and presenting with functional mobility limitations 2* decreased R LE strength/ROM and post op pain.  Pt should progress to dc home with assist of family if pt can arrange assist needed.        If plan is discharge home, recommend the following: A little help with walking and/or transfers;A little help with bathing/dressing/bathroom;Assistance with cooking/housework;Assist for transportation;Help with stairs or ramp for entrance   Can travel by private vehicle        Equipment Recommendations None recommended by PT  Recommendations for Other Services       Functional Status Assessment Patient has had a recent decline in their functional status and demonstrates the ability to make significant improvements in function in a reasonable and predictable amount of time.     Precautions / Restrictions Precautions Precautions: Fall Restrictions Weight Bearing Restrictions Per Provider Order: No      Mobility  Bed Mobility Overal bed mobility: Needs Assistance Bed Mobility: Supine to Sit     Supine to sit: Min assist, Mod assist     General bed mobility comments: cues for sequence    Transfers Overall transfer level: Needs assistance Equipment used: Rolling walker (2 wheels) Transfers: Sit to/from Stand Sit to Stand: From elevated surface, Min assist, Mod assist           General transfer comment: cues for LE management and use of UEs to self assist    Ambulation/Gait   Gait Distance (Feet): 52 Feet Assistive device: Rolling walker (2 wheels) Gait Pattern/deviations: Step-to pattern,  Decreased step length - right, Decreased step length - left, Shuffle, Trunk flexed Gait velocity: decr     General Gait Details: cues for sequence, posture and position from [employer]            Wheelchair Mobility     Tilt Bed    Modified Rankin (Stroke Patients Only)       Balance Overall balance assessment: Needs assistance Sitting-balance support: No upper extremity supported, Feet supported Sitting balance-Leahy Scale: Good     Standing balance support: Bilateral upper extremity supported Standing balance-Leahy Scale: Poor                               Pertinent Vitals/Pain Pain Assessment Pain Assessment: 0-10 Pain Score: 4  Pain Location: R knee Pain Descriptors / Indicators: Aching, Sore Pain Intervention(s): Limited activity within patient's tolerance, Monitored during session, Premedicated before session, Ice applied    Home Living Family/patient expects to be discharged to:: Private residence Living Arrangements: Alone Available Help at Discharge: Family;Other (Comment) ("we have to work on that") Type of Home: House Home Access: Stairs to enter Entrance Stairs-Rails: None Secretary/administrator of Steps: 1+1   Home Layout: Two level Home Equipment: Agricultural consultant (2 wheels);Rollator (4 wheels);Shower seat      Prior Function Prior Level of Function : Independent/Modified Independent             Mobility Comments: Pt is vague on use of assistive device but does admit to prior falls  Extremity/Trunk Assessment   Upper Extremity Assessment Upper Extremity Assessment: Overall WFL for tasks assessed    Lower Extremity Assessment Lower Extremity Assessment: LLE deficits/detail    Cervical / Trunk Assessment Cervical / Trunk Assessment: Normal  Communication   Communication Communication: No apparent difficulties Cueing Techniques: Verbal cues  Cognition Arousal: Alert Behavior During Therapy: WFL for tasks  assessed/performed Overall Cognitive Status: Within Functional Limits for tasks assessed                                          General Comments      Exercises General Exercises - Lower Extremity Ankle Circles/Pumps: AROM, Both, 15 reps, Supine   Assessment/Plan    PT Assessment Patient needs continued PT services  PT Problem List Decreased strength;Decreased range of motion;Decreased activity tolerance;Decreased mobility;Decreased knowledge of use of DME;Pain       PT Treatment Interventions DME instruction;Gait training;Stair training;Functional mobility training;Therapeutic activities;Therapeutic exercise;Patient/family education    PT Goals (Current goals can be found in the Care Plan section)  Acute Rehab PT Goals Patient Stated Goal: Regain IND PT Goal Formulation: With patient Time For Goal Achievement: 03/30/23 Potential to Achieve Goals: Fair    Frequency 7X/week     Co-evaluation               AM-PAC PT "6 Clicks" Mobility  Outcome Measure Help needed turning from your back to your side while in a flat bed without using bedrails?: A Lot Help needed moving from lying on your back to sitting on the side of a flat bed without using bedrails?: A Lot Help needed moving to and from a bed to a chair (including a wheelchair)?: A Lot Help needed standing up from a chair using your arms (e.g., wheelchair or bedside chair)?: A Lot Help needed to walk in hospital room?: A Little Help needed climbing 3-5 steps with a railing? : A Lot 6 Click Score: 13    End of Session Equipment Utilized During Treatment: Gait belt Activity Tolerance: Patient tolerated treatment well Patient left: in chair;with call bell/phone within reach;with chair alarm set;with family/visitor present Nurse Communication: Mobility status PT Visit Diagnosis: Unsteadiness on feet (R26.81);Difficulty in walking, not elsewhere classified (R26.2);Pain Pain - Right/Left:  Right Pain - part of body: Knee    Time: 0936-1005 PT Time Calculation (min) (ACUTE ONLY): 29 min   Charges:   PT Evaluation $PT Eval Low Complexity: 1 Low PT Treatments $Gait Training: 8-22 mins PT General Charges $$ ACUTE PT VISIT: 1 Visit         Mauro Kaufmann PT Acute Rehabilitation Services Pager [phone] Office [phone]   Abigail Teall 03/17/2023, 12:40 PM

## 2023-03-17 NOTE — Plan of Care (Signed)
  Problem: Pain Management: Goal: Pain level will decrease with appropriate interventions Outcome: Progressing   

## 2023-03-17 NOTE — Plan of Care (Signed)
  Problem: Clinical Measurements: Goal: Diagnostic test results will improve Outcome: Progressing Goal: Respiratory complications will improve Outcome: Progressing Goal: Cardiovascular complication will be avoided Outcome: Progressing   Problem: Activity: Goal: Risk for activity intolerance will decrease Outcome: Progressing   Problem: Nutrition: Goal: Adequate nutrition will be maintained Outcome: Progressing   Problem: Elimination: Goal: Will not experience complications related to bowel motility Outcome: Progressing Goal: Will not experience complications related to urinary retention Outcome: Progressing   Problem: Pain Managment: Goal: General experience of comfort will improve and/or be controlled Outcome: Progressing   Problem: Safety: Goal: Ability to remain free from injury will improve Outcome: Progressing   Problem: Skin Integrity: Goal: Risk for impaired skin integrity will decrease Outcome: Progressing   Problem: Education: Goal: Knowledge of the prescribed therapeutic regimen will improve Outcome: Progressing   Problem: Activity: Goal: Ability to avoid complications of mobility impairment will improve Outcome: Progressing   Problem: Clinical Measurements: Goal: Postoperative complications will be avoided or minimized Outcome: Progressing   Problem: Pain Management: Goal: Pain level will decrease with appropriate interventions Outcome: Progressing

## 2023-03-17 NOTE — Progress Notes (Signed)
Pharmacy Antibiotic Note  Judy White is a 83 y.o. female admitted on 03/15/2023 with R knee infection, s/p OR on 1/17.  Pharmacy has been consulted for vancomycin dosing.  Plan: Vancomycin 1g IV q24h (Scr rounded 0.8, Vd 0.5, est AUC 505) Measure Vanc levels as needed.  Goal AUC = 400 - 550 Follow up renal function, culture results, and clinical course.   Height: 5\' 5"  (165.1 cm) Weight: 91 kg (200 lb 9.9 oz) IBW/kg (Calculated) : 57  Temp (24hrs), Avg:97.9 F (36.6 C), Min:97.5 F (36.4 C), Max:98.3 F (36.8 C)  Recent Labs  Lab 03/15/23 1054 03/16/23 0531 03/17/23 0351  WBC 28.4* 18.8* 16.7*  CREATININE 0.93  --  0.58    Estimated Creatinine Clearance: 58.3 mL/min (by C-G formula based on SCr of 0.58 mg/dL).    Allergies  Allergen Reactions   Bupropion     Other reaction(s): anxiety   Rosuvastatin     Myalgias on 5mg  every other day   Statins     Antimicrobials this admission: 1/16 Vanc 2 gm in ED x 1 @ 1533 1/17 Vanc 1 gm in OR after cultures @ 1629 1/18 Vancomycin 1g q24h >>   Dose adjustments this admission:  Microbiology results: 1/16 synovial fluid (1234): ngtd 1/16 BCx2 ngtd 1/16 Anaerobic synovial fluid (1739): ngtd 1/16 R knee synovial fluid (1658): ngtd 1/17 MRSA/MSSA -/- 1/17 Synovium of the R knee (OR, 1622):    Thank you for allowing pharmacy to be a part of this patient's care.  Lynann Beaver PharmD, BCPS [facility] main pharmacy [phone] 03/17/2023 1:14 PM

## 2023-03-17 NOTE — Progress Notes (Signed)
Patient ID: Judy White, female   DOB: 28-Apr-1940, 83 y.o.   MRN: [id number] The patient is awake and alert this morning.  Her vital signs are stable.  Her son is at the bedside.  I did remove her right knee dressing and place an Aquacel dressing over the incision.  Her calf is soft.  There is no redness.  And surgery yesterday we found cloudy fluid in her right knee which was concerning.  There was no synovitis surprisingly.  The components look good.  There was no evidence of loosening or other worrisome findings in the knee except for this fluid.  Gram stain and cultures are still pending.  We sent off fluid from her knee from the emergency room and did send off tissue samples of her knee yesterday for Gram stain and culture.  She is now on IV vancomycin.  Her white blood cell count has come down some but is still elevated.  Her CRP and sed rate are elevated.  She understands that we will need to get the infectious disease service involved but I will likely wait until Monday depending on what the culture show.  She will need long-term IV antibiotics and attempt to salvage the knee.  Again yesterday we performed a complete synovectomy and a poly liner exchange.  We irrigated the knee with antibiotic solution and placed vancomycin powder in the knee as well.  This did come along quite acutely for her knee that has been in over 4 and half years with no previous issues until pain that developed suddenly 2 days ago.

## 2023-03-18 LAB — CBC
HCT: 34.8 % — ABNORMAL LOW (ref 36.0–46.0)
Hemoglobin: 11 g/dL — ABNORMAL LOW (ref 12.0–15.0)
MCH: 29.6 pg (ref 26.0–34.0)
MCHC: 31.6 g/dL (ref 30.0–36.0)
MCV: 93.8 fL (ref 80.0–100.0)
Platelets: 184 10*3/uL (ref 150–400)
RBC: 3.71 MIL/uL — ABNORMAL LOW (ref 3.87–5.11)
RDW: 12.8 % (ref 11.5–15.5)
WBC: 19.2 10*3/uL — ABNORMAL HIGH (ref 4.0–10.5)
nRBC: 0 % (ref 0.0–0.2)

## 2023-03-18 LAB — BODY FLUID CULTURE W GRAM STAIN: Culture: NO GROWTH

## 2023-03-18 MED ORDER — CEFAZOLIN SODIUM-DEXTROSE 2-4 GM/100ML-% IV SOLN
2.0000 g | Freq: Three times a day (TID) | INTRAVENOUS | Status: DC
  Administered 2023-03-18 – 2023-03-19 (×3): 2 g via INTRAVENOUS
  Filled 2023-03-18 (×4): qty 100

## 2023-03-18 NOTE — Progress Notes (Signed)
Physical Therapy Treatment Patient Details Name: Judy White MRN: [id number] DOB: 05-07-1940 Today's Date: 03/18/2023   History of Present Illness Pt admitted from home 2* R knee synovitis with acute effusion and now s/p open arthrotomy  with synovectomy, I&D, and poly exchange.  Pt with hx of COPD, Afib s/p ablation, back sx and R TKR    PT Comments  Pt continues very cooperative and assisted up to bathroom for toileting and hygiene standing at sink before ambulating increased distance in hall with marked improvement in stability vs am session.   If plan is discharge home, recommend the following: A little help with walking and/or transfers;A little help with bathing/dressing/bathroom;Assistance with cooking/housework;Assist for transportation;Help with stairs or ramp for entrance   Can travel by private vehicle        Equipment Recommendations  None recommended by PT    Recommendations for Other Services       Precautions / Restrictions Precautions Precautions: Fall Restrictions Weight Bearing Restrictions Per Provider Order: No Other Position/Activity Restrictions: WBAT     Mobility  Bed Mobility Overal bed mobility: Needs Assistance Bed Mobility: Sit to Supine     Supine to sit: Supervision, Contact guard Sit to supine: Min assist   General bed mobility comments: min assist for LEs onto bed    Transfers Overall transfer level: Needs assistance Equipment used: Rolling walker (2 wheels) Transfers: Sit to/from Stand Sit to Stand: Min assist           General transfer comment: cues for LE management and use of UEs to self assist    Ambulation/Gait Ambulation/Gait assistance: Min assist, Contact guard assist Gait Distance (Feet): 120 Feet (and 15' into bathroom) Assistive device: Rolling walker (2 wheels) Gait Pattern/deviations: Step-to pattern, Decreased step length - right, Decreased step length - left, Shuffle, Trunk flexed Gait velocity: decr      General Gait Details: cues for posture and position from [employer]             Wheelchair Mobility     Tilt Bed    Modified Rankin (Stroke Patients Only)       Balance Overall balance assessment: Needs assistance Sitting-balance support: No upper extremity supported, Feet supported Sitting balance-Leahy Scale: Good     Standing balance support: Single extremity supported Standing balance-Leahy Scale: Fair                              Cognition Arousal: Alert Behavior During Therapy: WFL for tasks assessed/performed, Impulsive Overall Cognitive Status: Within Functional Limits for tasks assessed                                          Exercises      General Comments        Pertinent Vitals/Pain Pain Assessment Pain Assessment: 0-10 Pain Score: 7  Pain Location: R knee with activity Pain Descriptors / Indicators: Aching, Sore Pain Intervention(s): Limited activity within patient's tolerance, Monitored during session, Premedicated before session    Home Living                          Prior Function            PT Goals (current goals can now be found in the care plan section) Acute Rehab  PT Goals Patient Stated Goal: Regain IND PT Goal Formulation: With patient Time For Goal Achievement: 03/30/23 Potential to Achieve Goals: Fair Progress towards PT goals: Progressing toward goals    Frequency    7X/week      PT Plan      Co-evaluation              AM-PAC PT "6 Clicks" Mobility   Outcome Measure  Help needed turning from your back to your side while in a flat bed without using bedrails?: A Lot Help needed moving from lying on your back to sitting on the side of a flat bed without using bedrails?: A Little Help needed moving to and from a bed to a chair (including a wheelchair)?: A Little Help needed standing up from a chair using your arms (e.g., wheelchair or bedside chair)?: A  Little Help needed to walk in hospital room?: A Little Help needed climbing 3-5 steps with a railing? : A Lot 6 Click Score: 16    End of Session Equipment Utilized During Treatment: Gait belt Activity Tolerance: Patient tolerated treatment well;Patient limited by pain Patient left: in bed;with call bell/phone within reach;with family/visitor present Nurse Communication: Mobility status PT Visit Diagnosis: Unsteadiness on feet (R26.81);Difficulty in walking, not elsewhere classified (R26.2);Pain Pain - Right/Left: Right Pain - part of body: Knee     Time: 9562-1308 PT Time Calculation (min) (ACUTE ONLY): 30 min  Charges:    $Gait Training: 8-22 mins $Therapeutic Activity: 8-22 mins PT General Charges $$ ACUTE PT VISIT: 1 Visit                     Mauro Kaufmann PT Acute Rehabilitation Services Pager [phone] Office [phone]    Judy White 03/18/2023, 3:31 PM

## 2023-03-18 NOTE — Progress Notes (Signed)
Pt got up to use the bedside commode. There was a small amount of bloody drainage leak out from aquacel dressing. Dressing reinforced with ABD pad. On-call PA, Piedad Climes, notified. No new order. Will continue to monitor.

## 2023-03-18 NOTE — Progress Notes (Signed)
Physical Therapy Treatment Patient Details Name: Judy White MRN: [id number] DOB: October 10, 1940 Today's Date: 03/18/2023   History of Present Illness Pt admitted from home 2* R knee synovitis with acute effusion and now s/p open arthrotomy  with synovectomy, I&D, and poly exchange.  Pt with hx of COPD, Afib s/p ablation, back sx and R TKR    PT Comments  Pt continues very cooperative but progress limited this am by pt reports of increasing pain and lightheadedness with activity - pt with notable increase in instability vs last session - BP 128/59 - RN aware.    If plan is discharge home, recommend the following: A little help with walking and/or transfers;A little help with bathing/dressing/bathroom;Assistance with cooking/housework;Assist for transportation;Help with stairs or ramp for entrance   Can travel by private vehicle        Equipment Recommendations  None recommended by PT    Recommendations for Other Services       Precautions / Restrictions Precautions Precautions: Fall Restrictions Weight Bearing Restrictions Per Provider Order: No Other Position/Activity Restrictions: WBAT     Mobility  Bed Mobility Overal bed mobility: Needs Assistance Bed Mobility: Supine to Sit     Supine to sit: Supervision, Contact guard     General bed mobility comments: for safety    Transfers Overall transfer level: Needs assistance Equipment used: Rolling walker (2 wheels) Transfers: Sit to/from Stand Sit to Stand: Min assist, From elevated surface           General transfer comment: cues for LE management and use of UEs to self assist    Ambulation/Gait Ambulation/Gait assistance: Min assist Gait Distance (Feet): 42 Feet Assistive device: Rolling walker (2 wheels) Gait Pattern/deviations: Step-to pattern, Decreased step length - right, Decreased step length - left, Shuffle, Trunk flexed Gait velocity: decr     General Gait Details: cues for sequence, posture and  position from RW - distance limited by increased pain and notable deterioration in stability   Stairs             Wheelchair Mobility     Tilt Bed    Modified Rankin (Stroke Patients Only)       Balance Overall balance assessment: Needs assistance Sitting-balance support: No upper extremity supported, Feet supported Sitting balance-Leahy Scale: Good     Standing balance support: Single extremity supported Standing balance-Leahy Scale: Poor                              Cognition Arousal: Alert Behavior During Therapy: WFL for tasks assessed/performed, Impulsive Overall Cognitive Status: Within Functional Limits for tasks assessed                                          Exercises      General Comments        Pertinent Vitals/Pain Pain Assessment Pain Assessment: 0-10 Pain Score: 6  Pain Location: R knee with activity Pain Descriptors / Indicators: Aching, Sore Pain Intervention(s): Limited activity within patient's tolerance, Monitored during session, Premedicated before session, Ice applied    Home Living                          Prior Function            PT Goals (current goals can now  be found in the care plan section) Acute Rehab PT Goals Patient Stated Goal: Regain IND PT Goal Formulation: With patient Time For Goal Achievement: 03/30/23 Potential to Achieve Goals: Fair Progress towards PT goals: Not progressing toward goals - comment (Increased pain/fatigue)    Frequency    7X/week      PT Plan      Co-evaluation              AM-PAC PT "6 Clicks" Mobility   Outcome Measure  Help needed turning from your back to your side while in a flat bed without using bedrails?: A Lot Help needed moving from lying on your back to sitting on the side of a flat bed without using bedrails?: A Little Help needed moving to and from a bed to a chair (including a wheelchair)?: A Little Help needed  standing up from a chair using your arms (e.g., wheelchair or bedside chair)?: A Little Help needed to walk in hospital room?: A Little Help needed climbing 3-5 steps with a railing? : A Lot 6 Click Score: 16    End of Session Equipment Utilized During Treatment: Gait belt Activity Tolerance: Patient limited by pain;Patient limited by fatigue Patient left: in chair;with call bell/phone within reach;with chair alarm set Nurse Communication: Mobility status PT Visit Diagnosis: Unsteadiness on feet (R26.81);Difficulty in walking, not elsewhere classified (R26.2);Pain Pain - Right/Left: Right Pain - part of body: Knee     Time: 0950-1027 PT Time Calculation (min) (ACUTE ONLY): 37 min  Charges:    $Gait Training: 8-22 mins $Therapeutic Activity: 8-22 mins PT General Charges $$ ACUTE PT VISIT: 1 Visit                     Mauro Kaufmann PT Acute Rehabilitation Services Pager [phone] Office [phone]    Attila Mccarthy 03/18/2023, 11:22 AM

## 2023-03-18 NOTE — Plan of Care (Signed)

## 2023-03-18 NOTE — Progress Notes (Signed)
   03/18/23 1930  BiPAP/CPAP/SIPAP  Reason BIPAP/CPAP not in use Other(comment) (Pt states she had tried it last night and tonight she wants a break and thats what she does at home. Machine remained bedside if pt changes her mind. She refused cpap tonight also said doesnt feel like home.)

## 2023-03-18 NOTE — Progress Notes (Addendum)
Subjective: 2 Days Post-Op Procedure(s) (LRB): IRRIGATION AND DEBRIDEMENT KNEE WITH POLY EXCHANGE (Right) Patient reports pain as moderate.    Objective: Vital signs in last 24 hours: Temp:  [97.6 F (36.4 C)-98.4 F (36.9 C)] 98.3 F (36.8 C) (01/19 0816) Pulse Rate:  [54-100] 100 (01/19 0816) Resp:  [15-18] 18 (01/19 0816) BP: (107-147)/(53-92) 124/83 (01/19 0816) SpO2:  [93 %-100 %] 97 % (01/19 0816)  Intake/Output from previous day: 01/18 0701 - 01/19 0700 In: 1400.1 [P.O.:1200; IV Piggyback:200.1] Out: 1400 [Urine:1400] Intake/Output this shift: Total I/O In: 240 [P.O.:240] Out: 0   Recent Labs    03/16/23 0531 03/17/23 0351 03/18/23 0315  HGB 12.5 11.6* 11.0*   Recent Labs    03/17/23 0351 03/18/23 0315  WBC 16.7* 19.2*  RBC 3.93 3.71*  HCT 36.8 34.8*  PLT 142* 184   Recent Labs    03/17/23 0351  NA 132*  K 4.6  CL 99  CO2 25  BUN 22  CREATININE 0.58  GLUCOSE 174*  CALCIUM 8.4*   No results for input(s): "LABPT", "INR" in the last 72 hours.  Neurologically intact Neurovascular intact Sensation intact distally Intact pulses distally Dorsiflexion/Plantar flexion intact Incision: moderate drainage No cellulitis present Compartment soft   Assessment/Plan: 2 Days Post-Op Procedure(s) (LRB): IRRIGATION AND DEBRIDEMENT KNEE WITH POLY EXCHANGE (Right) Advance diet Up with therapy Continue ABX therapy due to Post-op infection WBAT RLE Continue abx- ancef/vanc Awaiting cx- blackman to consult ID tomorrow Wbc increasing.  Sed rate/crp elevated Aquacel to be changed     Cristie Hem 03/18/2023, 10:57 AM

## 2023-03-18 NOTE — Anesthesia Postprocedure Evaluation (Signed)
Anesthesia Post Note  Patient: Judy White  Procedure(s) Performed: IRRIGATION AND DEBRIDEMENT KNEE WITH POLY EXCHANGE (Right: Knee)     Patient location during evaluation: PACU Anesthesia Type: Spinal Level of consciousness: oriented and awake and alert Pain management: pain level controlled Vital Signs Assessment: post-procedure vital signs reviewed and stable Respiratory status: spontaneous breathing, respiratory function stable and patient connected to nasal cannula oxygen Cardiovascular status: blood pressure returned to baseline and stable Postop Assessment: no headache, no backache and no apparent nausea or vomiting Anesthetic complications: no   No notable events documented.  Last Vitals:  Vitals:   03/17/23 2203 03/18/23 0556  BP:  (!) 147/92  Pulse:  100  Resp:  16  Temp:  36.4 C  SpO2: 95% 100%    Last Pain:  Vitals:   03/18/23 0414  TempSrc:   PainSc: 4                  Mariann Barter

## 2023-03-19 ENCOUNTER — Other Ambulatory Visit: Payer: Self-pay

## 2023-03-19 ENCOUNTER — Encounter (HOSPITAL_COMMUNITY): Payer: Self-pay | Admitting: Orthopaedic Surgery

## 2023-03-19 DIAGNOSIS — T8453XA Infection and inflammatory reaction due to internal right knee prosthesis, initial encounter: Secondary | ICD-10-CM | POA: Diagnosis not present

## 2023-03-19 DIAGNOSIS — M65961 Unspecified synovitis and tenosynovitis, right lower leg: Secondary | ICD-10-CM | POA: Diagnosis not present

## 2023-03-19 LAB — GLUCOSE, BODY FLUID OTHER

## 2023-03-19 LAB — BASIC METABOLIC PANEL
Anion gap: 6 (ref 5–15)
BUN: 27 mg/dL — ABNORMAL HIGH (ref 8–23)
CO2: 30 mmol/L (ref 22–32)
Calcium: 8.2 mg/dL — ABNORMAL LOW (ref 8.9–10.3)
Chloride: 100 mmol/L (ref 98–111)
Creatinine, Ser: 0.72 mg/dL (ref 0.44–1.00)
GFR, Estimated: >60 mL/min (ref >60)
Glucose, Bld: 131 mg/dL — ABNORMAL HIGH (ref 70–99)
Potassium: 4.6 mmol/L (ref 3.5–5.1)
Sodium: 136 mmol/L (ref 135–145)

## 2023-03-19 LAB — URIC ACID, BODY FLUID

## 2023-03-19 LAB — CBC
HCT: 34.1 % — ABNORMAL LOW (ref 36.0–46.0)
Hemoglobin: 10.4 g/dL — ABNORMAL LOW (ref 12.0–15.0)
MCH: 28.7 pg (ref 26.0–34.0)
MCHC: 30.5 g/dL (ref 30.0–36.0)
MCV: 93.9 fL (ref 80.0–100.0)
Platelets: 184 10*3/uL (ref 150–400)
RBC: 3.63 MIL/uL — ABNORMAL LOW (ref 3.87–5.11)
RDW: 12.8 % (ref 11.5–15.5)
WBC: 13.7 10*3/uL — ABNORMAL HIGH (ref 4.0–10.5)
nRBC: 0 % (ref 0.0–0.2)

## 2023-03-19 LAB — BODY FLUID CULTURE W GRAM STAIN
Culture: NO GROWTH
Special Requests: NORMAL

## 2023-03-19 LAB — PROTEIN, BODY FLUID (OTHER)

## 2023-03-19 MED ORDER — SODIUM CHLORIDE 0.9 % IV SOLN
2.0000 g | INTRAVENOUS | Status: DC
  Administered 2023-03-19 – 2023-03-21 (×3): 2 g via INTRAVENOUS
  Filled 2023-03-19 (×3): qty 20

## 2023-03-19 NOTE — Progress Notes (Signed)
Patient ID: Judy White, female   DOB: 1940/12/20, 83 y.o.   MRN: [id number] The patient's white blood cell count is fortunately trending down today.  It is still high at 13,000 but it is less than yesterday.  She has been a little bradycardic.  The cultures are still showing no organisms which is surprising.  Today we will order a PICC line and consult the infectious disease service.  She can continue to be up with PT as well.

## 2023-03-19 NOTE — Progress Notes (Signed)
Physical Therapy Treatment Patient Details Name: Judy White MRN: [id number] DOB: 09-25-1940 Today's Date: 03/19/2023   History of Present Illness Pt admitted from home 2* R knee synovitis with acute effusion and now s/p open arthrotomy  with synovectomy, I&D, and poly exchange.  Pt with hx of COPD, Afib s/p ablation, back sx and R TKR    PT Comments  Pt progressing well this session, complaining of pain however mobilizing well. Overall supervision to CGA. Educated pt on calling for pain meds if she needs them. RN aware.  Continue PT POC   If plan is discharge home, recommend the following: A little help with walking and/or transfers;A little help with bathing/dressing/bathroom;Assistance with cooking/housework;Assist for transportation;Help with stairs or ramp for entrance   Can travel by private vehicle        Equipment Recommendations  None recommended by PT    Recommendations for Other Services       Precautions / Restrictions Precautions Precautions: Fall Restrictions Weight Bearing Restrictions Per Provider Order: No Other Position/Activity Restrictions: WBAT     Mobility  Bed Mobility Overal bed mobility: Needs Assistance Bed Mobility: Supine to Sit     Supine to sit: Supervision, Contact guard     General bed mobility comments: using gait belt to self assist    Transfers Overall transfer level: Needs assistance Equipment used: Rolling walker (2 wheels) Transfers: Sit to/from Stand Sit to Stand: Contact guard assist           General transfer comment: cues for LE management and use of UEs to self assist; STS From bed and toilet    Ambulation/Gait Ambulation/Gait assistance: Contact guard assist Gait Distance (Feet): 90 Feet (15' more) Assistive device: Rolling walker (2 wheels) Gait Pattern/deviations: Step-to pattern, Decreased step length - right, Decreased step length - left, Decreased stance time - right Gait velocity: decr     General Gait  Details: cues for posture and position from [employer]             Wheelchair Mobility     Tilt Bed    Modified Rankin (Stroke Patients Only)       Balance Overall balance assessment: Needs assistance Sitting-balance support: No upper extremity supported, Feet supported Sitting balance-Leahy Scale: Good     Standing balance support: Single extremity supported Standing balance-Leahy Scale: Fair                              Cognition Arousal: Alert Behavior During Therapy: WFL for tasks assessed/performed, Impulsive Overall Cognitive Status: Within Functional Limits for tasks assessed                                          Exercises      General Comments        Pertinent Vitals/Pain Pain Assessment Pain Assessment: 0-10 Pain Score: 7  Pain Location: R knee with activity Pain Descriptors / Indicators: Aching, Sore Pain Intervention(s): Limited activity within patient's tolerance, Monitored during session, Premedicated before session, Repositioned, Patient requesting pain meds-RN notified    Home Living                          Prior Function            PT Goals (current goals can now  be found in the care plan section) Acute Rehab PT Goals Patient Stated Goal: Regain IND PT Goal Formulation: With patient Time For Goal Achievement: 03/30/23 Potential to Achieve Goals: Fair Progress towards PT goals: Progressing toward goals    Frequency    7X/week      PT Plan      Co-evaluation              AM-PAC PT "6 Clicks" Mobility   Outcome Measure  Help needed turning from your back to your side while in a flat bed without using bedrails?: A Lot Help needed moving from lying on your back to sitting on the side of a flat bed without using bedrails?: A Little Help needed moving to and from a bed to a chair (including a wheelchair)?: A Little Help needed standing up from a chair using your arms (e.g.,  wheelchair or bedside chair)?: A Little Help needed to walk in hospital room?: A Little Help needed climbing 3-5 steps with a railing? : A Lot 6 Click Score: 16    End of Session Equipment Utilized During Treatment: Gait belt Activity Tolerance: Patient tolerated treatment well;Patient limited by pain Patient left: with call bell/phone within reach;in chair;with chair alarm set Nurse Communication: Mobility status PT Visit Diagnosis: Unsteadiness on feet (R26.81);Difficulty in walking, not elsewhere classified (R26.2);Pain Pain - Right/Left: Right Pain - part of body: Knee     Time: 1040-1103 PT Time Calculation (min) (ACUTE ONLY): 23 min  Charges:    $Gait Training: 8-22 mins $Therapeutic Activity: 8-22 mins PT General Charges $$ ACUTE PT VISIT: 1 Visit                     Sitlaly Gudiel, PT  Acute Rehab Dept [facility]) [phone]  03/19/2023    [facility] 03/19/2023, 11:09 AM

## 2023-03-19 NOTE — Plan of Care (Signed)
  Problem: Education: Goal: Knowledge of General Education information will improve Description: Including pain rating scale, medication(s)/side effects and non-pharmacologic comfort measures Outcome: Progressing   Problem: Health Behavior/Discharge Planning: Goal: Ability to manage health-related needs will improve Outcome: Progressing   Problem: Clinical Measurements: Goal: Respiratory complications will improve Outcome: Progressing   Problem: Activity: Goal: Risk for activity intolerance will decrease Outcome: Progressing   Problem: Coping: Goal: Level of anxiety will decrease Outcome: Progressing   Problem: Pain Managment: Goal: General experience of comfort will improve and/or be controlled Outcome: Progressing   Problem: Safety: Goal: Ability to remain free from injury will improve Outcome: Progressing   Problem: Skin Integrity: Goal: Risk for impaired skin integrity will decrease Outcome: Progressing

## 2023-03-19 NOTE — Progress Notes (Signed)
Physical Therapy Treatment Patient Details Name: Judy White MRN: [id number] DOB: Apr 08, 1940 Today's Date: 03/19/2023   History of Present Illness Pt admitted from home 2* R knee synovitis with acute effusion and now s/p open arthrotomy  with synovectomy, I&D, and poly exchange.  Pt with hx of COPD, Afib s/p ablation, back sx and R TKR    PT Comments  Pt progressing well this pm, on EOB with RN on PT arrival; increased gait distance with progression to step through pattern, improved wt shift to RLE;   tolerating incr R knee ROM as well  and less assistance overall compared to am session. Continue PT POC.   If plan is discharge home, recommend the following: A little help with walking and/or transfers;A little help with bathing/dressing/bathroom;Assistance with cooking/housework;Assist for transportation;Help with stairs or ramp for entrance   Can travel by private vehicle        Equipment Recommendations  None recommended by PT    Recommendations for Other Services       Precautions / Restrictions Precautions Precautions: Fall Restrictions Weight Bearing Restrictions Per Provider Order: No Other Position/Activity Restrictions: WBAT     Mobility  Bed Mobility Overal bed mobility: Needs Assistance Bed Mobility: Sit to Supine     Supine to sit: Supervision, Contact guard Sit to supine: Contact guard assist   General bed mobility comments: cues and incr time    Transfers Overall transfer level: Needs assistance Equipment used: Rolling walker (2 wheels) Transfers: Sit to/from Stand Sit to Stand: Contact guard assist           General transfer comment: cues for LE management and use of UEs to self assist; STS From bed and toilet    Ambulation/Gait Ambulation/Gait assistance: Contact guard assist Gait Distance (Feet): 130 Feet Assistive device: Rolling walker (2 wheels) Gait Pattern/deviations: Step-to pattern, Decreased step length - right, Decreased step length -  left, Decreased stance time - right Gait velocity: decr     General Gait Details: cues for, knee flexiong during swing phase, heel strike on R, posture and position from RW. progression to step through pattern with imrpvoved wt shift RLE   Stairs             Wheelchair Mobility     Tilt Bed    Modified Rankin (Stroke Patients Only)       Balance Overall balance assessment: Needs assistance Sitting-balance support: No upper extremity supported, Feet supported Sitting balance-Leahy Scale: Good     Standing balance support: Single extremity supported Standing balance-Leahy Scale: Fair                              Cognition Arousal: Alert Behavior During Therapy: WFL for tasks assessed/performed, Impulsive Overall Cognitive Status: Within Functional Limits for tasks assessed                                          Exercises Total Joint Exercises Ankle Circles/Pumps: AROM, Both, 5 reps Quad Sets: AROM, Both, 5 reps Heel Slides: AAROM, Right, 10 reps    General Comments        Pertinent Vitals/Pain Pain Assessment Pain Assessment: 0-10 Pain Score: 7  Pain Location: R knee with activity Pain Descriptors / Indicators: Aching, Sore Pain Intervention(s): Limited activity within patient's tolerance, Monitored during session, Premedicated before session, Repositioned, Patient  requesting pain meds-RN notified    Home Living                          Prior Function            PT Goals (current goals can now be found in the care plan section) Acute Rehab PT Goals Patient Stated Goal: Regain IND PT Goal Formulation: With patient Time For Goal Achievement: 03/30/23 Potential to Achieve Goals: Fair Progress towards PT goals: Progressing toward goals    Frequency    7X/week      PT Plan      Co-evaluation              AM-PAC PT "6 Clicks" Mobility   Outcome Measure  Help needed turning from your back  to your side while in a flat bed without using bedrails?: A Little Help needed moving from lying on your back to sitting on the side of a flat bed without using bedrails?: A Little Help needed moving to and from a bed to a chair (including a wheelchair)?: A Little Help needed standing up from a chair using your arms (e.g., wheelchair or bedside chair)?: A Little Help needed to walk in hospital room?: A Little Help needed climbing 3-5 steps with a railing? : A Lot 6 Click Score: 17    End of Session Equipment Utilized During Treatment: Gait belt Activity Tolerance: Patient tolerated treatment well Patient left: with bed alarm set;with family/visitor present;with call bell/phone within reach Nurse Communication: Mobility status PT Visit Diagnosis: Unsteadiness on feet (R26.81);Difficulty in walking, not elsewhere classified (R26.2);Pain Pain - Right/Left: Right Pain - part of body: Knee     Time: 8295-6213 PT Time Calculation (min) (ACUTE ONLY): 15 min  Charges:    $Gait Training: 8-22 mins PT General Charges $$ ACUTE PT VISIT: 1 Visit                     Jomarion Mish, PT  Acute Rehab Dept [facility]) [phone]  03/19/2023    [facility] 03/19/2023, 4:51 PM

## 2023-03-19 NOTE — Consult Note (Signed)
[facility]    Date of Admission:  03/15/2023   Total days of inpatient antibiotics 4        Reason for Consult: . PJI   Principal Problem:   Synovitis of right knee, questionable infection   Assessment: 83 year old female with history of right knee arthroplasty in 2020 due to right knee arthrocentesis complicated by some right knee plating in 2022 with MRI not being concerning admitted with: #Right knee PJI status post poly exchange #Leukocytosis-trending down - Patient reports that she started having knee pain 2 days prior to admission, with worsening redness and swelling 1 day prior.  She underwent aspiration of right knee x 2 with 100 7K WBC, 92% neutrophils, cultures negative - On arrival she had a peripheral WBC of 28K started on vancomycin - Taken to the OR with orthopedics Dr. Magnus Ivan on 1/17 who did not note any signs of chronic synovial inflammation.  Recommendations:  -Follow or cultures - Continue vancomycin, - DC cefazolin - Start ceftriaxone - Place PICC Microbiology:   Antibiotics: Vancomycin 1/16-present Cefazolin 1/19-present  Cultures: Blood /16 no growth Urine  Other 1/16 no growth x 3 days 1/16 no growth x 3-day 1/17 OR cultures pending  HPI: Judy White is a 83 y.o. female with past medical history of agoraphobia, anxiety, depression, COPD, PE, OSA on CPAP, DVT, hypertension, hyperlipidemia, right total knee arthroplasty in 2020 secondary to significant right knee arthritis progressed well postoperatively of the, 70 p.m. in 2022 with MRI showing synovitis in the knee but not worrisome features scented with knee pain x 2 nights.  Developed swelling and redness of the right knee 24 hours prior to ED visit.  On arrival to the ED showed WBC 28K.  ESR over 6.  Right knee arthrocentesis x2 10 7K WBC, 92% neutrophils, no crystals, cultures negative taken to the OR for open arthrotomy with synovectomy all 3 components, poly  exchange.  Or findings notable for cloudy effusion right knee, no evidence of metallosis, synovium did not appear chronically inflamed.   Review of Systems: Review of Systems  All other systems reviewed and are negative.   Past Medical History:  Diagnosis Date   Agoraphobia    Anxiety    Arthritis    "normal for the age; nothing gives me problems" (01/24/2016)   COPD (chronic obstructive pulmonary disease) (HCC)    "mild" (01/24/2016)   Depressive disorder, not elsewhere classified    DVT (deep venous thrombosis) (HCC) 10/08/2015   "they said the blood clot went from one of my legs to my lung"   Dysrhythmia    History of hiatal hernia    "small one" (01/24/2016)   Hypercholesteremia    Hypertension    pt denies this hx on 01/24/2016   Myalgia and myositis, unspecified    Osteopenia    Overactive bladder    Persistent atrial fibrillation (HCC)    a. s/p Watchman implant 12/2014   Presence of Watchman left atrial appendage closure device    Pulmonary embolism (HCC) 10/08/2015   Sleep apnea    USES CPAP     Social History   Tobacco Use   Smoking status: Former    Current packs/day: 0.00    Average packs/day: 0.5 packs/day for 60.0 years (30.0 ttl pk-yrs)    Types: Cigarettes    Start date: 10/08/1955    Quit date: 10/08/2015    Years since quitting: 7.4   Smokeless tobacco:  Never  Vaping Use   Vaping status: Never Used  Substance Use Topics   Alcohol use: Yes    Alcohol/week: 0.0 standard drinks of alcohol    Comment: occ   Drug use: No    Family History  Problem Relation Age of Onset   Aneurysm Father    Hypotension Mother    Neuropathy Brother    Heart disease Brother    Cancer Other        maternal side   Heart Problems Other        paternal side   Heart attack Neg Hx    Scheduled Meds:  ascorbic acid  500 mg Oral Daily   aspirin  81 mg Oral BID   clonazePAM  1.5 mg Oral QHS   docusate sodium  100 mg Oral BID   FLUoxetine  20 mg Oral Daily    furosemide  20 mg Oral Daily   gabapentin  100 mg Oral TID   metoprolol succinate  12.5 mg Oral Daily   mometasone-formoterol  2 puff Inhalation BID   And   umeclidinium bromide  1 puff Inhalation Daily   mupirocin ointment  1 Application Nasal BID   oxybutynin  10 mg Oral Daily   vitamin E  400 Units Oral Daily   Continuous Infusions:   ceFAZolin (ANCEF) IV Stopped (03/19/23 0933)   vancomycin 1,000 mg (03/18/23 1718)   PRN Meds:.acetaminophen, albuterol, diphenhydrAMINE, HYDROmorphone (DILAUDID) injection, menthol-cetylpyridinium **OR** phenol, metoCLOPramide **OR** metoCLOPramide (REGLAN) injection, ondansetron **OR** ondansetron (ZOFRAN) IV, oxyCODONE, oxyCODONE Allergies  Allergen Reactions   Bupropion     Other reaction(s): anxiety   Rosuvastatin     Myalgias on 5mg  every other day   Statins     OBJECTIVE: Blood pressure 120/66, pulse 64, temperature 98.7 F (37.1 C), temperature source Oral, resp. rate 16, height 5\' 5"  (1.651 m), weight 91 kg, SpO2 (!) 87%.  Physical Exam Constitutional:      Appearance: Normal appearance.  HENT:     Head: Normocephalic and atraumatic.     Right Ear: Tympanic membrane normal.     Left Ear: Tympanic membrane normal.     Nose: Nose normal.     Mouth/Throat:     Mouth: Mucous membranes are moist.  Eyes:     Extraocular Movements: Extraocular movements intact.     Conjunctiva/sclera: Conjunctivae normal.     Pupils: Pupils are equal, round, and reactive to light.  Cardiovascular:     Rate and Rhythm: Normal rate and regular rhythm.     Heart sounds: No murmur heard.    No friction rub. No gallop.  Pulmonary:     Effort: Pulmonary effort is normal.     Breath sounds: Normal breath sounds.  Abdominal:     General: Abdomen is flat.     Palpations: Abdomen is soft.  Musculoskeletal:        General: Normal range of motion.  Skin:    General: Skin is warm and dry.  Neurological:     General: No focal deficit present.     Mental  Status: She is alert and oriented to person, place, and time.  Psychiatric:        Mood and Affect: Mood normal.     Lab Results Lab Results  Component Value Date   WBC 13.7 (H) 03/19/2023   HGB 10.4 (L) 03/19/2023   HCT 34.1 (L) 03/19/2023   MCV 93.9 03/19/2023   PLT 184 03/19/2023    Lab Results  Component Value Date   CREATININE 0.72 03/19/2023   BUN 27 (H) 03/19/2023   NA 136 03/19/2023   K 4.6 03/19/2023   CL 100 03/19/2023   CO2 30 03/19/2023    Lab Results  Component Value Date   ALT 10 12/28/2017   AST 17 12/28/2017   ALKPHOS 95 12/28/2017   BILITOT 0.3 12/28/2017       Danelle Earthly, MD [facility] 03/19/2023, 12:44 PM

## 2023-03-20 ENCOUNTER — Encounter (HOSPITAL_COMMUNITY): Payer: Self-pay | Admitting: Orthopaedic Surgery

## 2023-03-20 LAB — CBC
HCT: 33.8 % — ABNORMAL LOW (ref 36.0–46.0)
Hemoglobin: 11.4 g/dL — ABNORMAL LOW (ref 12.0–15.0)
MCH: 30 pg (ref 26.0–34.0)
MCHC: 33.7 g/dL (ref 30.0–36.0)
MCV: 88.9 fL (ref 80.0–100.0)
Platelets: 194 10*3/uL (ref 150–400)
RBC: 3.8 MIL/uL — ABNORMAL LOW (ref 3.87–5.11)
RDW: 12.9 % (ref 11.5–15.5)
WBC: 12.5 10*3/uL — ABNORMAL HIGH (ref 4.0–10.5)
nRBC: 0 % (ref 0.0–0.2)

## 2023-03-20 LAB — CULTURE, BLOOD (ROUTINE X 2)
Culture: NO GROWTH
Culture: NO GROWTH

## 2023-03-20 LAB — ANAEROBIC CULTURE W GRAM STAIN

## 2023-03-20 MED ORDER — SODIUM CHLORIDE 0.9% FLUSH: 10.0000 mL | INTRAVENOUS | Status: DC | PRN

## 2023-03-20 MED ORDER — CHLORHEXIDINE GLUCONATE CLOTH 2 % EX PADS
6.0000 | MEDICATED_PAD | Freq: Every day | CUTANEOUS | Status: DC
  Administered 2023-03-20 – 2023-03-21 (×2): 6 via TOPICAL

## 2023-03-20 MED ORDER — SODIUM CHLORIDE 0.9% FLUSH
10.0000 mL | Freq: Two times a day (BID) | INTRAVENOUS | Status: DC
  Administered 2023-03-20 – 2023-03-21 (×2): 10 mL

## 2023-03-20 NOTE — Progress Notes (Signed)
Patient ID: Judy White, female   DOB: 1940/08/02, 83 y.o.   MRN: [id number] The patient continues to look better and feel better.  She did get a PICC line and today.  Surprisingly the cultures are still have showing no organisms.  Her white blood cell count continues to come down.  Hopefully will have a disposition by tomorrow in terms of the type of antibiotics and duration.  Even if there are no organisms, based on what we saw inside the knee in terms of how cloudy that fluid looked combined with the pain she was having and her high peripheral white blood cell count, I still feel that she needs prolonged antibiotics.  I will defer the antibiotic type and duration to Dr. Thedore Mins with infectious disease.  Hopefully things can be set up for home by tomorrow.

## 2023-03-20 NOTE — Anesthesia Procedure Notes (Signed)
Spinal  Patient location during procedure: OR Start time: 03/16/2023 4:00 PM End time: 03/16/2023 4:03 PM Reason for block: surgical anesthesia Staffing Performed: anesthesiologist  Anesthesiologist: Mariann Barter, MD Performed by: Mariann Barter, MD Authorized by: Mariann Barter, MD   Preanesthetic Checklist Completed: patient identified, IV checked, site marked, risks and benefits discussed, surgical consent, monitors and equipment checked, pre-op evaluation and timeout performed Spinal Block Patient position: sitting Prep: DuraPrep Patient monitoring: heart rate, cardiac monitor, continuous pulse ox and blood pressure Approach: midline Location: L3-4 Injection technique: single-shot Needle Needle type: Sprotte  Needle gauge: 24 G Needle length: 9 cm Assessment Sensory level: T4 Events: CSF return

## 2023-03-20 NOTE — Progress Notes (Signed)
Peripherally Inserted Central Catheter Placement  The IV Nurse has discussed with the patient and/or persons authorized to consent for the patient, the purpose of this procedure and the potential benefits and risks involved with this procedure.  The benefits include less needle sticks, lab draws from the catheter, and the patient may be discharged home with the catheter. Risks include, but not limited to, infection, bleeding, blood clot (thrombus formation), and puncture of an artery; nerve damage and irregular heartbeat and possibility to perform a PICC exchange if needed/ordered by physician.  Alternatives to this procedure were also discussed.  Bard Power PICC patient education guide, fact sheet on infection prevention and patient information card has been provided to patient /or left at bedside.    PICC Placement Documentation  PICC Single Lumen 03/20/23 Right Basilic 35 cm 0 cm (Active)  Indication for Insertion or Continuance of Line Prolonged intravenous therapies 03/20/23 0827  Exposed Catheter (cm) 0 cm 03/20/23 0827  Site Assessment Clean, Dry, Intact 03/20/23 0827  Line Status Flushed;Blood return noted;Saline locked 03/20/23 0827  Dressing Type Transparent 03/20/23 0827  Dressing Status Antimicrobial disc/dressing in place 03/20/23 0827  Line Care Connections checked and tightened 03/20/23 0827  Line Adjustment (NICU/IV Team Only) No 03/20/23 0827  Dressing Intervention New dressing 03/20/23 0827  Dressing Change Due 03/27/23 03/20/23 0827       Judy White 03/20/2023, 8:29 AM

## 2023-03-20 NOTE — Addendum Note (Signed)
Addendum  created 03/20/23 0903 by Mariann Barter, MD   Child order released for a procedure order, Clinical Note Signed, Intraprocedure Blocks edited, SmartForm saved

## 2023-03-20 NOTE — Progress Notes (Signed)
Physical Therapy Treatment Patient Details Name: Judy White MRN: [id number] DOB: 11-Oct-1940 Today's Date: 03/20/2023   History of Present Illness Pt admitted from home 2* R knee synovitis with acute effusion and now s/p open arthrotomy  with synovectomy, I&D, and poly exchange.  Pt with hx of COPD, Afib s/p ablation, back sx and R TKR    PT Comments  Pt making steady progress with PT, good pt effort this session and no incr pain with activity. Iceman applied after session. Knee flexion to ~ 85 degrees, AAROM Pt son present for session. Continue PT POC    If plan is discharge home, recommend the following: A little help with walking and/or transfers;A little help with bathing/dressing/bathroom;Assistance with cooking/housework;Assist for transportation;Help with stairs or ramp for entrance   Can travel by private vehicle        Equipment Recommendations  None recommended by PT    Recommendations for Other Services       Precautions / Restrictions Precautions Precautions: Fall Restrictions Weight Bearing Restrictions Per Provider Order: No Other Position/Activity Restrictions: WBAT     Mobility  Bed Mobility Overal bed mobility: Needs Assistance Bed Mobility: Supine to Sit     Supine to sit: Supervision     General bed mobility comments: cues and incr time,supervision for safety    Transfers Overall transfer level: Needs assistance Equipment used: Rolling walker (2 wheels) Transfers: Sit to/from Stand Sit to Stand: Contact guard assist, Supervision           General transfer comment: cues for LE management and use of UEs to self assist;    Ambulation/Gait Ambulation/Gait assistance: Contact guard assist, Supervision Gait Distance (Feet): 150 Feet Assistive device: Rolling walker (2 wheels) Gait Pattern/deviations: Decreased step length - right, Decreased step length - left, Decreased stance time - right, Step-through pattern, Step-to pattern Gait velocity:  decr     General Gait Details: cues for, knee flexion during swing phase, heel strike on R, posture and position from RW. progression to step through pattern with imprvoved wt shift RLE   Stairs             Wheelchair Mobility     Tilt Bed    Modified Rankin (Stroke Patients Only)       Balance Overall balance assessment: Needs assistance Sitting-balance support: No upper extremity supported, Feet supported Sitting balance-Leahy Scale: Good     Standing balance support: Single extremity supported Standing balance-Leahy Scale: Fair                              Cognition Arousal: Alert Behavior During Therapy: WFL for tasks assessed/performed, Impulsive Overall Cognitive Status: Within Functional Limits for tasks assessed                                          Exercises Total Joint Exercises Ankle Circles/Pumps: AROM, Both, 5 reps Quad Sets: AROM, Both, 5 reps Heel Slides: AAROM, Right, 10 reps    General Comments        Pertinent Vitals/Pain Pain Assessment Pain Assessment: 0-10 Pain Score: 4  Pain Location: R knee with activity Pain Descriptors / Indicators: Aching, Sore Pain Intervention(s): Limited activity within patient's tolerance, Monitored during session, Premedicated before session, Repositioned, Ice applied    Home Living  Prior Function            PT Goals (current goals can now be found in the care plan section) Acute Rehab PT Goals Patient Stated Goal: Regain IND PT Goal Formulation: With patient Time For Goal Achievement: 03/30/23 Potential to Achieve Goals: Fair Progress towards PT goals: Progressing toward goals    Frequency    7X/week      PT Plan      Co-evaluation              AM-PAC PT "6 Clicks" Mobility   Outcome Measure  Help needed turning from your back to your side while in a flat bed without using bedrails?: A Little Help needed  moving from lying on your back to sitting on the side of a flat bed without using bedrails?: A Little Help needed moving to and from a bed to a chair (including a wheelchair)?: A Little Help needed standing up from a chair using your arms (e.g., wheelchair or bedside chair)?: A Little Help needed to walk in hospital room?: A Little Help needed climbing 3-5 steps with a railing? : A Lot 6 Click Score: 17    End of Session Equipment Utilized During Treatment: Gait belt Activity Tolerance: Patient tolerated treatment well Patient left: with family/visitor present;with call bell/phone within reach;in chair;with chair alarm set Nurse Communication: Mobility status PT Visit Diagnosis: Unsteadiness on feet (R26.81);Difficulty in walking, not elsewhere classified (R26.2);Pain Pain - Right/Left: Right Pain - part of body: Knee     Time: 1100-1131 PT Time Calculation (min) (ACUTE ONLY): 31 min  Charges:    $Gait Training: 8-22 mins $Therapeutic Exercise: 8-22 mins PT General Charges $$ ACUTE PT VISIT: 1 Visit                     Rula Keniston, PT  Acute Rehab Dept [facility]) [phone]  03/20/2023    [facility] 03/20/2023, 12:39 PM

## 2023-03-20 NOTE — Progress Notes (Signed)
Patient ID: Judy White, female   DOB: 28-Jun-1940, 83 y.o.   MRN: [id number] The patient's white blood cell count fortunately continues to trend down.  Her right operative knee is stable.  She has been mobilizing with therapy.  A PICC line has been ordered but not placed as of yet.  I certainly appreciate the infectious disease service and Dr. Thedore Mins consulting on this patient.  Disposition will be depending on what antibiotics are recommended for her.  I will also consult the transitional care team to assist with setting up her home health needs.

## 2023-03-20 NOTE — Progress Notes (Signed)
   03/19/23 2350  BiPAP/CPAP/SIPAP  BiPAP/CPAP/SIPAP Pt Type Adult  BiPAP/CPAP/SIPAP DREAMSTATIOND  Mask Type Full face mask (Pts. FFMask)  EPAP 8 cmH2O (<'d per pt. request/Ramped from 4 cmh20)  Flow Rate 2 lpm  Patient Home Equipment No (Own mask/Oxygen line)  Auto Titrate No   Placed pt. on CPAP/DS(ours) for h/s, has own [facility] and Oxygen line/connector placed at 2 lpm, tolerating well, made aware to notify if needed.

## 2023-03-20 NOTE — Progress Notes (Signed)
Pharmacy Antibiotic Note  Judy White is a 83 y.o. female admitted on 03/15/2023 with R knee infection, s/p OR on 1/17.  Pharmacy has been consulted for vancomycin dosing.  Plan: Continue vancomycin 1g IV q24h (Scr rounded 0.8, Vd 0.5, est AUC 505) Measure Vanc levels as needed.  Goal AUC = 400 - 550 Follow up renal function, culture results, and clinical course.   Height: 5\' 5"  (165.1 cm) Weight: 91 kg (200 lb 9.9 oz) IBW/kg (Calculated) : 57  Temp (24hrs), Avg:98.6 F (37 C), Min:98.1 F (36.7 C), Max:99.5 F (37.5 C)  Recent Labs  Lab 03/15/23 1054 03/16/23 0531 03/17/23 0351 03/18/23 0315 03/19/23 0245 03/20/23 0341  WBC 28.4* 18.8* 16.7* 19.2* 13.7* 12.5*  CREATININE 0.93  --  0.58  --  0.72  --     Estimated Creatinine Clearance: 58.3 mL/min (by C-G formula based on SCr of 0.72 mg/dL).    Allergies  Allergen Reactions   Bupropion     Other reaction(s): anxiety   Rosuvastatin     Myalgias on 5mg  every other day   Statins     Antimicrobials this admission: Antimicrobials this admission: 1/16 Vanc 2 gm in ED x 1 @ 1533 1/17 Vanc 1 gm in OR after cultures @ 1629 1/18 Vancomycin 1g q24h >>  1/19 Cefazolin >> 1/20  1/21 Ceftriaxone >>   Dose adjustments this admission:  Microbiology results: 1/16 BCx2 ngtd 1/16 synovial fluid (1234): ngtd 1/16 Anaerobic synovial fluid (1739): ngtd 1/16 R knee synovial fluid (1658): ngtd 1/17 MRSA/MSSA -/- 1/17 Synovium of the R knee (OR, 1622):  NGTD    Adalberto Cole, PharmD, BCPS 03/20/2023 8:30 AM

## 2023-03-20 NOTE — TOC Progression Note (Signed)
Transition of Care [facility]) - Progression Note    Patient Details  Name: Judy White MRN: [id number] Date of Birth: [birth year]  [facility] CM/SW Contact  Howell Rucks, RN Phone Number: 03/20/2023, 11:43 AM  Clinical Narrative:  Patient to dc home with home iv abx, no agency preference,  referral to [employer], rep-Pam, accepted for home iv abx. HH PT/RN for PICC line care, no agency preference, [facility] rep-Cory, accepted, added to AVS.           Expected Discharge Plan and Services                                               Social Determinants of Health (SDOH) Interventions SDOH Screenings   Food Insecurity: No Food Insecurity (03/19/2023)  Housing: Low Risk  (03/19/2023)  Transportation Needs: No Transportation Needs (03/19/2023)  Utilities: Not At Risk (03/19/2023)  Social Connections: Unknown (03/19/2023)  Tobacco Use: Medium Risk (03/16/2023)    Readmission Risk Interventions    03/20/2023   11:43 AM  Readmission Risk Prevention Plan  Transportation Screening Complete  PCP or Specialist Appt within 5-7 Days Complete  Home Care Screening Complete  Medication Review (RN CM) Complete

## 2023-03-21 ENCOUNTER — Other Ambulatory Visit: Payer: Self-pay

## 2023-03-21 ENCOUNTER — Other Ambulatory Visit (HOSPITAL_COMMUNITY): Payer: Self-pay

## 2023-03-21 DIAGNOSIS — M65961 Unspecified synovitis and tenosynovitis, right lower leg: Secondary | ICD-10-CM | POA: Diagnosis not present

## 2023-03-21 LAB — CBC
HCT: 32.4 % — ABNORMAL LOW (ref 36.0–46.0)
Hemoglobin: 10.4 g/dL — ABNORMAL LOW (ref 12.0–15.0)
MCH: 29.2 pg (ref 26.0–34.0)
MCHC: 32.1 g/dL (ref 30.0–36.0)
MCV: 91 fL (ref 80.0–100.0)
Platelets: 218 10*3/uL (ref 150–400)
RBC: 3.56 MIL/uL — ABNORMAL LOW (ref 3.87–5.11)
RDW: 12.8 % (ref 11.5–15.5)
WBC: 13.6 10*3/uL — ABNORMAL HIGH (ref 4.0–10.5)
nRBC: 0 % (ref 0.0–0.2)

## 2023-03-21 LAB — CK: Total CK: 450 U/L — ABNORMAL HIGH (ref 38–234)

## 2023-03-21 MED ORDER — ASPIRIN 81 MG PO CHEW
81.0000 mg | CHEWABLE_TABLET | Freq: Two times a day (BID) | ORAL | 0 refills | Status: DC
  Filled 2023-03-21: qty 30, 15d supply, fill #0

## 2023-03-21 MED ORDER — POLYETHYLENE GLYCOL 3350 17 G PO PACK: 17.0000 g | PACK | Freq: Every day | ORAL | Status: DC | PRN

## 2023-03-21 MED ORDER — DAPTOMYCIN-SODIUM CHLORIDE 700-0.9 MG/100ML-% IV SOLN
700.0000 mg | Freq: Every day | INTRAVENOUS | Status: DC
  Filled 2023-03-21: qty 100

## 2023-03-21 MED ORDER — CEFTRIAXONE IV (FOR PTA / DISCHARGE USE ONLY): 2.0000 g | INTRAVENOUS | 0 refills | Status: DC

## 2023-03-21 MED ORDER — BISACODYL 10 MG RE SUPP: 10.0000 mg | Freq: Every day | RECTAL | Status: DC | PRN

## 2023-03-21 MED ORDER — HEPARIN SOD (PORK) LOCK FLUSH 100 UNIT/ML IV SOLN
250.0000 [IU] | INTRAVENOUS | Status: AC | PRN
  Administered 2023-03-21: 250 [IU]

## 2023-03-21 MED ORDER — OXYCODONE HCL 5 MG PO TABS
5.0000 mg | ORAL_TABLET | Freq: Four times a day (QID) | ORAL | 0 refills | Status: DC | PRN
  Filled 2023-03-21: qty 30, 4d supply, fill #0

## 2023-03-21 MED ORDER — DAPTOMYCIN IV (FOR PTA / DISCHARGE USE ONLY): 600.0000 mg | INTRAVENOUS | 0 refills | Status: DC

## 2023-03-21 MED ORDER — SENNOSIDES-DOCUSATE SODIUM 8.6-50 MG PO TABS
1.0000 | ORAL_TABLET | Freq: Once | ORAL | Status: AC
  Administered 2023-03-21: 1 via ORAL
  Filled 2023-03-21: qty 1

## 2023-03-21 MED ORDER — DAPTOMYCIN-SODIUM CHLORIDE 700-0.9 MG/100ML-% IV SOLN
700.0000 mg | Freq: Every day | INTRAVENOUS | Status: DC
  Administered 2023-03-21: 700 mg via INTRAVENOUS
  Filled 2023-03-21: qty 100

## 2023-03-21 NOTE — Plan of Care (Signed)
  Problem: Education: Goal: Knowledge of General Education information will improve Description: Including pain rating scale, medication(s)/side effects and non-pharmacologic comfort measures Outcome: Progressing   Problem: Activity: Goal: Risk for activity intolerance will decrease Outcome: Progressing   Problem: Pain Managment: Goal: General experience of comfort will improve and/or be controlled Outcome: Progressing

## 2023-03-21 NOTE — Progress Notes (Signed)
   03/20/23 2200  BiPAP/CPAP/SIPAP  BiPAP/CPAP/SIPAP Pt Type Adult  BiPAP/CPAP/SIPAP DREAMSTATIOND  Mask Type Full face mask  EPAP 8 cmH2O  Flow Rate 2 lpm  Patient Home Equipment No  Auto Titrate No

## 2023-03-21 NOTE — Progress Notes (Signed)
Physical Therapy Treatment Patient Details Name: Judy White MRN: [id number] DOB: 04/16/1940 Today's Date: 03/21/2023   History of Present Illness Pt admitted from home 2* R knee synovitis with acute effusion and now s/p open arthrotomy  with synovectomy, I&D, and poly exchange.  Pt with hx of COPD, Afib s/p ablation, back sx and R TKR    PT Comments  Pt is progressing well with mobility and is ready to DC home from a PT standpoint. She ambulated 160' with RW, no loss of balance. Stair training completed, pt/son demonstrated good understanding of HEP.      If plan is discharge home, recommend the following: A little help with walking and/or transfers;A little help with bathing/dressing/bathroom;Assistance with cooking/housework;Assist for transportation;Help with stairs or ramp for entrance   Can travel by private vehicle        Equipment Recommendations  None recommended by PT    Recommendations for Other Services       Precautions / Restrictions Precautions Precautions: Fall;Knee Precaution Booklet Issued: Yes (comment) Precaution Comments: reviewed no pillow under knee Restrictions Weight Bearing Restrictions Per Provider Order: No Other Position/Activity Restrictions: WBAT     Mobility  Bed Mobility               General bed mobility comments: up in recliner    Transfers Overall transfer level: Needs assistance Equipment used: Rolling walker (2 wheels) Transfers: Sit to/from Stand Sit to Stand: Supervision           General transfer comment: VCs for hand placement    Ambulation/Gait Ambulation/Gait assistance: Supervision Gait Distance (Feet): 160 Feet Assistive device: Rolling walker (2 wheels) Gait Pattern/deviations: Decreased step length - right, Decreased step length - left, Decreased stance time - right, Step-through pattern, Step-to pattern Gait velocity: decr     General Gait Details: steady with RW, no loss of balance   Stairs Stairs:  Yes Stairs assistance: Min assist Stair Management: No rails, Backwards, Step to pattern, With walker Number of Stairs: 1 General stair comments: son present, min A to steady RW, VCs sequencing   Wheelchair Mobility     Tilt Bed    Modified Rankin (Stroke Patients Only)       Balance Overall balance assessment: Needs assistance Sitting-balance support: No upper extremity supported, Feet supported Sitting balance-Leahy Scale: Good     Standing balance support: Single extremity supported Standing balance-Leahy Scale: Fair                              Cognition Arousal: Alert Behavior During Therapy: WFL for tasks assessed/performed, Impulsive Overall Cognitive Status: Within Functional Limits for tasks assessed                                          Exercises Total Joint Exercises Ankle Circles/Pumps: AROM, Both, 10 reps, Supine Short Arc Quad: AROM, Right, 5 reps, Supine Heel Slides: AAROM, Right, 5 reps Hip ABduction/ADduction: AAROM, Right, 5 reps, Supine Long Arc Quad: AROM, Right, 5 reps, Seated Knee Flexion: AAROM, Right, 5 reps, Seated Goniometric ROM: 5-65* AAROM    General Comments        Pertinent Vitals/Pain Pain Assessment Pain Score: 8  Pain Location: R knee with activity Pain Intervention(s): Limited activity within patient's tolerance, Monitored during session, Patient requesting pain meds-RN notified, Ice applied, Repositioned  Home Living                          Prior Function            PT Goals (current goals can now be found in the care plan section) Acute Rehab PT Goals Patient Stated Goal: Regain IND PT Goal Formulation: With patient/family Time For Goal Achievement: 03/30/23 Potential to Achieve Goals: Fair Progress towards PT goals: Goals met/education completed, patient discharged from PT    Frequency    7X/week      PT Plan      Co-evaluation              AM-PAC  PT "6 Clicks" Mobility   Outcome Measure  Help needed turning from your back to your side while in a flat bed without using bedrails?: None Help needed moving from lying on your back to sitting on the side of a flat bed without using bedrails?: A Little Help needed moving to and from a bed to a chair (including a wheelchair)?: None Help needed standing up from a chair using your arms (e.g., wheelchair or bedside chair)?: None Help needed to walk in hospital room?: None Help needed climbing 3-5 steps with a railing? : A Little 6 Click Score: 22    End of Session Equipment Utilized During Treatment: Gait belt Activity Tolerance: Patient tolerated treatment well Patient left: with family/visitor present;with call bell/phone within reach;in chair;with chair alarm set Nurse Communication: Mobility status PT Visit Diagnosis: Unsteadiness on feet (R26.81);Difficulty in walking, not elsewhere classified (R26.2);Pain Pain - Right/Left: Right Pain - part of body: Knee     Time: 2376-2831 PT Time Calculation (min) (ACUTE ONLY): 42 min  Charges:    $Gait Training: 8-22 mins $Therapeutic Exercise: 8-22 mins $Therapeutic Activity: 8-22 mins PT General Charges $$ ACUTE PT VISIT: 1 Visit                     Tamala Ser PT 03/21/2023  Acute Rehabilitation Services  Office [phone]

## 2023-03-21 NOTE — Discharge Summary (Signed)
Patient ID: Judy White MRN: [id number] DOB/AGE: 1941/02/08 83 y.o.  Admit date: 03/15/2023 Discharge date: 03/21/2023  Admission Diagnoses:  Principal Problem:   Synovitis of right knee, questionable infection   Discharge Diagnoses:  Same  Past Medical History:  Diagnosis Date   Agoraphobia    Anxiety    Arthritis    "normal for the age; nothing gives me problems" (01/24/2016)   COPD (chronic obstructive pulmonary disease) (HCC)    "mild" (01/24/2016)   Depressive disorder, not elsewhere classified    DVT (deep venous thrombosis) (HCC) 10/08/2015   "they said the blood clot went from one of my legs to my lung"   Dysrhythmia    History of hiatal hernia    "small one" (01/24/2016)   Hypercholesteremia    Hypertension    pt denies this hx on 01/24/2016   Myalgia and myositis, unspecified    Osteopenia    Overactive bladder    Persistent atrial fibrillation (HCC)    a. s/p Watchman implant 12/2014   Presence of Watchman left atrial appendage closure device    Pulmonary embolism (HCC) 10/08/2015   Sleep apnea    USES CPAP     Surgeries: Procedure(s): IRRIGATION AND DEBRIDEMENT KNEE WITH POLY EXCHANGE on 03/16/2023   Consultants: Treatment Team:  Kathryne Hitch, MD  Discharged Condition: Improved  Hospital Course: Judy White is an 83 y.o. female who was admitted 03/15/2023 for operative treatment ofSynovitis of right knee. Patient has severe unremitting pain that affects sleep, daily activities, and work/hobbies. After pre-op clearance the patient was taken to the operating room on 03/16/2023 and underwent  Procedure(s): IRRIGATION AND DEBRIDEMENT KNEE WITH POLY EXCHANGE.    Patient was given perioperative antibiotics:  Anti-infectives (From admission, onward)    Start     Dose/Rate Route Frequency Ordered Stop   03/21/23 1200  DAPTOmycin (CUBICIN) IVPB 700 mg/11mL premix        700 mg 200 mL/hr over 30 Minutes Intravenous Daily 03/21/23 0850     03/21/23  0915  DAPTOmycin (CUBICIN) IVPB 700 mg/140mL premix  Status:  Discontinued        700 mg 200 mL/hr over 30 Minutes Intravenous Daily 03/21/23 0822 03/21/23 0850   03/21/23 0000  daptomycin (CUBICIN) IVPB        600 mg Intravenous Every 24 hours 03/21/23 1115 04/27/23 2359   03/21/23 0000  cefTRIAXone (ROCEPHIN) IVPB        2 g Intravenous Every 24 hours 03/21/23 1115 04/27/23 2359   03/19/23 1345  cefTRIAXone (ROCEPHIN) 2 g in sodium chloride 0.9 % 100 mL IVPB        2 g 200 mL/hr over 30 Minutes Intravenous Every 24 hours 03/19/23 1259     03/18/23 1000  ceFAZolin (ANCEF) IVPB 2g/100 mL premix  Status:  Discontinued        2 g 200 mL/hr over 30 Minutes Intravenous Every 8 hours 03/18/23 0850 03/19/23 1259   03/18/23 0400  vancomycin (VANCOREADY) IVPB 1250 mg/250 mL  Status:  Discontinued        1,250 mg 166.7 mL/hr over 90 Minutes Intravenous Every 36 hours 03/16/23 1951 03/17/23 1315   03/17/23 1600  vancomycin (VANCOCIN) IVPB 1000 mg/200 mL premix  Status:  Discontinued        1,000 mg 200 mL/hr over 60 Minutes Intravenous Every 24 hours 03/17/23 1315 03/21/23 0822   03/16/23 1659  vancomycin (VANCOCIN) powder  Status:  Discontinued  As needed 03/16/23 1659 03/16/23 1929   03/16/23 1616  vancomycin (VANCOCIN) 1-5 GM/200ML-% IVPB       Note to Pharmacy: Candee Furbish W: cabinet override      03/16/23 1616 03/17/23 0429   03/15/23 1445  vancomycin (VANCOCIN) IVPB 1000 mg/200 mL premix        1,000 mg 200 mL/hr over 60 Minutes Intravenous Every 1 hr x 2 03/15/23 1437 03/15/23 1748        Patient was given sequential compression devices, early ambulation, and chemoprophylaxis to prevent DVT. During the patient's hospitalization, the Gram stain and cultures from fluid from her right knee as well as soft tissue from the right knee did not grow any organisms.  However given her high peripheral white blood cell count combined with the very high white blood cell count within the  knee synovial fluid, this still feels like it is a significant infection with her right total knee arthroplasty.  A PICC line was placed during the hospitalization and Dr. Thedore Mins from the ID service was consulted.  With her help she was able to give guidance for the type of antibiotic and discharge the patient on and the duration of antibiotics.  She did well from mobility standpoint with therapy and we felt it was safe to discharge her to home with close follow-up.  Recent vital signs: Patient Vitals for the past 24 hrs:  BP Temp Temp src Pulse Resp SpO2  03/21/23 0901 -- -- -- -- -- 97 %  03/21/23 0857 -- -- -- -- -- 97 %  03/21/23 0634 (!) 146/68 98.1 F (36.7 C) Oral 86 18 99 %  03/20/23 2220 (!) 144/60 98.9 F (37.2 C) Oral (!) 51 18 93 %  03/20/23 1343 128/64 97.7 F (36.5 C) -- 90 17 97 %     Recent laboratory studies:  Recent Labs    03/19/23 0245 03/20/23 0341 03/21/23 0444  WBC 13.7* 12.5* 13.6*  HGB 10.4* 11.4* 10.4*  HCT 34.1* 33.8* 32.4*  PLT 184 194 218  NA 136  --   --   K 4.6  --   --   CL 100  --   --   CO2 30  --   --   BUN 27*  --   --   CREATININE 0.72  --   --   GLUCOSE 131*  --   --   CALCIUM 8.2*  --   --      Discharge Medications:   Allergies as of 03/21/2023       Reactions   Bupropion    Other reaction(s): anxiety   Rosuvastatin    Myalgias on 5mg  every other day   Statins         Medication List     STOP taking these medications    aspirin EC 81 MG tablet Replaced by: aspirin 81 MG chewable tablet       TAKE these medications    acetaminophen 500 MG tablet Commonly known as: TYLENOL Take 500-1,000 mg by mouth every 6 (six) hours as needed for moderate pain (pain score 4-6) or headache.   albuterol 108 (90 Base) MCG/ACT inhaler Commonly known as: VENTOLIN HFA Inhale 2 puffs into the lungs every 4 (four) hours as needed for wheezing or shortness of breath.   ascorbic acid 500 MG tablet Commonly known as: VITAMIN C Take 500  mg by mouth daily.   aspirin 81 MG chewable tablet Chew 1 tablet (81 mg total)  by mouth 2 (two) times daily. Replaces: aspirin EC 81 MG tablet   Breztri Aerosphere 160-9-4.8 MCG/ACT Aero Generic drug: Budeson-Glycopyrrol-Formoterol Inhale 2 puffs into the lungs 2 (two) times daily.   CALTRATE 600 PLUS-VIT D PO Take 1 tablet by mouth daily.   cefTRIAXone IVPB Commonly known as: ROCEPHIN Inject 2 g into the vein daily. Indication:  R-knee PJI First Dose: Yes Last Day of Therapy:  04/27/23 Labs - Once weekly:  CBC/D and BMP, Labs - Once weekly: ESR and CRP Method of administration: IV Push Method of administration may be changed at the discretion of home infusion pharmacist based upon assessment of the patient and/or caregiver's ability to self-administer the medication ordered.   clonazePAM 1 MG tablet Commonly known as: KLONOPIN Take 1.5 tablets (1.5 mg total) by mouth in the morning. May also take 0.5 tablets (0.5 mg total) daily as needed.   daptomycin IVPB Commonly known as: CUBICIN Inject 600 mg into the vein daily. Indication:  R-knee PJI First Dose: Yes Last Day of Therapy:  04/27/23 Labs - Once weekly:  CBC/D, BMP, ESR and CRP Labs - Twice weekly initially: CPK Method of administration: IV Push Method of administration may be changed at the discretion of home infusion pharmacist based upon assessment of the patient and/or caregiver's ability to self-administer the medication ordered.   FLUoxetine 20 MG capsule Commonly known as: PROZAC Take 1 capsule (20 mg total) by mouth daily with breakfast.   furosemide 20 MG tablet Commonly known as: LASIX Take 1 tablet (20 mg total) by mouth daily.   gabapentin 100 MG capsule Commonly known as: NEURONTIN Take 1 capsule (100 mg total) by mouth 3 (three) times daily.   GAVISCON PO Take 2 each by mouth daily as needed.   IMODIUM PO Take 2 tablets by mouth daily as needed.   metoprolol succinate 25 MG 24 hr  tablet Commonly known as: TOPROL-XL Take 0.5 tablets (12.5 mg total) by mouth daily.   oxybutynin 10 MG 24 hr tablet Commonly known as: DITROPAN-XL Take 1 tablet (10 mg total) by mouth daily.   oxyCODONE 5 MG immediate release tablet Commonly known as: Oxy IR/ROXICODONE Take 1-2 tablets (5-10 mg total) by mouth every 6 (six) hours as needed for moderate pain (pain score 4-6).   Potassium 99 MG Tabs Take 99 mg by mouth daily.   PRESERVISION AREDS PO Take 1 each by mouth in the morning and at bedtime.   Vitamin D3 50 MCG (2000 UT) Tabs Take 2,000 Units by mouth daily.   vitamin E 180 MG (400 UNITS) capsule Take 400 Units daily by mouth.               Discharge Care Instructions  (From admission, onward)           Start     Ordered   03/21/23 0000  Change dressing on IV access line weekly and PRN  (Home infusion instructions - [facility] )        03/21/23 1115            Diagnostic Studies: [place] EKG SITE RITE Result Date: 03/19/2023 If Site Rite image not attached, placement could not be confirmed due to current cardiac rhythm.  DG Knee Complete 4 Views Right Result Date: 03/15/2023 CLINICAL DATA:  Right knee pain. EXAM: RIGHT KNEE - COMPLETE 4+ VIEW COMPARISON:  Right knee radiographs dated September 13, 2020. FINDINGS: Status post right total knee arthroplasty with appropriate alignment. The femoral and tibial  components appear well seated. No periprosthetic lucency identified. No acute fracture or dislocation. Small to moderate-sized knee joint effusion. Enthesopathy at the superior patellar pole. A 13 mm radiopaque structure projecting over the anteromedial distal thigh is presumably external to the patient. No significant focal soft tissue swelling. IMPRESSION: 1. Intact right total knee arthroplasty. No acute osseous abnormality. 2. Small to moderate-sized knee joint effusion. Electronically Signed   By: Hart Robinsons M.D.   On: 03/15/2023 09:42     Disposition: Discharge disposition: 01-Home or Self Care       Discharge Instructions     [facility] pharmacist to adjust dose for Vancomycin, Aminoglycosides and other anti-infective therapies as requested by physician.   Complete by: As directed    [facility] to provide Cath Flo 2mg    Complete by: As directed    Administer for PICC line occlusion and as ordered by physician for other access device issues.   Anaphylaxis Kit: Provided to treat any anaphylactic reaction to the medication being provided to the patient if First Dose or when requested by physician   Complete by: As directed    Epinephrine 1mg /ml vial / amp: Administer 0.3mg  (0.37ml) subcutaneously once for moderate to severe anaphylaxis, nurse to call physician and pharmacy when reaction occurs and call 911 if needed for immediate care   Diphenhydramine 50mg /ml IV vial: Administer 25-50mg  IV/IM PRN for first dose reaction, rash, itching, mild reaction, nurse to call physician and pharmacy when reaction occurs   Sodium Chloride 0.9% NS IV: Administer if needed for hypovolemic blood pressure drop or as ordered by physician after call to physician with anaphylactic reaction   Change dressing on IV access line weekly and PRN   Complete by: As directed    Flush IV access with Sodium Chloride 0.9% and Heparin 10 units/ml or 100 units/ml   Complete by: As directed    Home infusion instructions - [facility]   Complete by: As directed    Instructions: Flush IV access with Sodium Chloride 0.9% and Heparin 10units/ml or 100units/ml   Change dressing on IV access line: Weekly and PRN   Instructions Cath Flo 2mg : Administer for PICC Line occlusion and as ordered by physician for other access device   [facility] pharmacist to adjust dose for: Vancomycin, Aminoglycosides and other anti-infective therapies as requested by physician   Method of administration may be changed at the  discretion of home infusion pharmacist based upon assessment of the patient and/or caregiver's ability to self-administer the medication ordered   Complete by: As directed         Follow-up Information     Care, [facility] Follow up.   Specialty: Home Health Services Why: Home Health Physical Therapy, Registered Nursing for PICC line care Contact information: [address] [place] [address] [phone]         Kathryne Hitch, MD. Schedule an appointment as soon as possible for a visit in 2 week(s).   Specialty: Orthopedic Surgery Contact information: [address] [place] [address] [phone]                  Signed: Kathryne Hitch 03/21/2023, 11:15 AM

## 2023-03-21 NOTE — Progress Notes (Signed)
PHARMACY CONSULT NOTE FOR:  OUTPATIENT  PARENTERAL ANTIBIOTIC THERAPY (OPAT)  Indication: R-knee PJI Regimen: Daptomycin 600 mg IV every 24 hours and Rocephin 2g IV every 24 hours End date: 04/27/23 (6 weeks from OR 03/16/23)  IV antibiotic discharge orders are pended. To discharging provider:  please sign these orders via discharge navigator,  Select New Orders & click on the button choice - Manage This Unsigned Work.     Thank you for allowing pharmacy to be a part of this patient's care.  Georgina Pillion, PharmD, BCPS, BCIDP Infectious Diseases Clinical Pharmacist 03/21/2023 8:51 AM   **Pharmacist phone directory can now be found on [email or web address] ([email or web address]).  Listed under [facility].

## 2023-03-21 NOTE — Progress Notes (Signed)
Patient ID: Judy White, female   DOB: August 17, 1940, 83 y.o.   MRN: [id number] Patient remains afebrile with stable vital signs.  I did remove her right knee dressing today.  Her knee remains without any cellulitis and minimal swelling.  Incision is clean and intact and I did place a new Aquacel dressing.  Her calf is soft.  She does have her PICC line placed.  We are awaiting final recommendations from the ID service and Dr. Thedore Mins in terms of the type of antibiotic to send her home on.  Her white blood cell count is still slightly elevated at 13,000.  Surprisingly the cultures have still not grown out any organism.  However we were in a knee that was highly suspicious for infection given the patient's pain combined with the consistency of the fluid that we encountered.  Thus, it is prudent to still consider 6 weeks of IV antibiotics.  Hopefully she could potentially be discharged to home later today.

## 2023-03-21 NOTE — Progress Notes (Signed)
[facility]  Date of Admission:  03/15/2023   Total days of inpatient antibiotics 4  Principal Problem:   Synovitis of right knee, questionable infection          Assessment: 83 year old female with history of right knee arthroplasty in 2020 due to right knee arthrocentesis complicated by some right knee plating in 2022 with MRI not being concerning admitted with: #Right knee PJI status post poly exchange #Leukocytosis-trending down - Patient reports that she started having knee pain 2 days prior to admission, with worsening redness and swelling 1 day prior.  She underwent aspiration of right knee x 2 with 100 7K WBC, 92% neutrophils, cultures negative - On arrival she had a peripheral WBC of 28K started on vancomycin - Taken to the OR with orthopedics Dr. Magnus Ivan on 1/17 who did not note any signs of chronic synovial inflammation.   Recommendations:  -D/C vancomycin -Start dapotmycin - Continue  ceftriaxone - Plan on 6 weeks of abx form OR OPAT ORDERS:  Diagnosis: Cx negative R knee pji  Allergies  Allergen Reactions   Bupropion     Other reaction(s): anxiety   Rosuvastatin     Myalgias on 5mg  every other day   Statins      Discharge antibiotics to be given via PICC line:  Per pharmacy protocol Daptomycin 600 mg IV every 24 hours and Rocephin 2g IV every 24 hours     Duration: 6 weeks End Date: 2/28  [facility] Care Per Protocol with [employer] Use: Home health RN for IV administration and teaching, line care and labs.    Labs weekly while on IV antibiotics: _x_ CBC with differential __ BMP **TWICE WEEKLY ON VANCOMYCIN  x__ CMP __x CRP _x_ ESR __ Vancomycin trough TWICE WEEKLY x__ CK  __ Please pull PIC at completion of IV antibiotics __ Please leave PIC in place until doctor hxas seen patient or been notified  Fax weekly labs to [phone]  Clinic Follow Up Appt: 2/5  @ [facility] with Dr. Thedore Mins   Microbiology:    Antibiotics: Vancomycin 1/16-present Cefazolin 1/19-present   Cultures: Blood /16 no growth Urine   Other 1/16 no growth x 3 days 1/16 no growth x 3-day 1/17 OR NG  SUBJECTIVE: Sitting in bed. Son at bedside.  Interval: Afebrile overnight.   Review of Systems: Review of Systems  All other systems reviewed and are negative.    Scheduled Meds:  ascorbic acid  500 mg Oral Daily   aspirin  81 mg Oral BID   Chlorhexidine Gluconate Cloth  6 each Topical Daily   clonazePAM  1.5 mg Oral QHS   docusate sodium  100 mg Oral BID   FLUoxetine  20 mg Oral Daily   furosemide  20 mg Oral Daily   gabapentin  100 mg Oral TID   metoprolol succinate  12.5 mg Oral Daily   mometasone-formoterol  2 puff Inhalation BID   And   umeclidinium bromide  1 puff Inhalation Daily   mupirocin ointment  1 Application Nasal BID   oxybutynin  10 mg Oral Daily   sodium chloride flush  10-40 mL Intracatheter Q12H   vitamin E  400 Units Oral Daily   Continuous Infusions:  cefTRIAXone (ROCEPHIN)  IV 2 g (03/21/23 0855)   DAPTOmycin     PRN Meds:.acetaminophen, albuterol, bisacodyl, diphenhydrAMINE, HYDROmorphone (DILAUDID) injection, menthol-cetylpyridinium **OR** phenol, metoCLOPramide **OR** metoCLOPramide (REGLAN) injection, ondansetron **OR** ondansetron (ZOFRAN) IV, oxyCODONE,  oxyCODONE, polyethylene glycol, sodium chloride flush Allergies  Allergen Reactions   Bupropion     Other reaction(s): anxiety   Rosuvastatin     Myalgias on 5mg  every other day   Statins     OBJECTIVE: Vitals:   03/20/23 2220 03/21/23 0634 03/21/23 0857 03/21/23 0901  BP: (!) 144/60 (!) 146/68    Pulse: (!) 51 86    Resp: 18 18    Temp: 98.9 F (37.2 C) 98.1 F (36.7 C)    TempSrc: Oral Oral    SpO2: 93% 99% 97% 97%  Weight:      Height:       Body mass index is 33.38 kg/m.  Physical Exam Constitutional:      Appearance: Normal appearance.  HENT:     Head: Normocephalic and atraumatic.     Right  Ear: Tympanic membrane normal.     Left Ear: Tympanic membrane normal.     Nose: Nose normal.     Mouth/Throat:     Mouth: Mucous membranes are moist.  Eyes:     Extraocular Movements: Extraocular movements intact.     Conjunctiva/sclera: Conjunctivae normal.     Pupils: Pupils are equal, round, and reactive to light.  Cardiovascular:     Rate and Rhythm: Normal rate and regular rhythm.     Heart sounds: No murmur heard.    No friction rub. No gallop.  Pulmonary:     Effort: Pulmonary effort is normal.     Breath sounds: Normal breath sounds.  Abdominal:     General: Abdomen is flat.     Palpations: Abdomen is soft.  Musculoskeletal:     Comments: R knee wound  Skin:    General: Skin is warm and dry.  Neurological:     General: No focal deficit present.     Mental Status: She is alert and oriented to person, place, and time.  Psychiatric:        Mood and Affect: Mood normal.       Lab Results Lab Results  Component Value Date   WBC 13.6 (H) 03/21/2023   HGB 10.4 (L) 03/21/2023   HCT 32.4 (L) 03/21/2023   MCV 91.0 03/21/2023   PLT 218 03/21/2023    Lab Results  Component Value Date   CREATININE 0.72 03/19/2023   BUN 27 (H) 03/19/2023   NA 136 03/19/2023   K 4.6 03/19/2023   CL 100 03/19/2023   CO2 30 03/19/2023    Lab Results  Component Value Date   ALT 10 12/28/2017   AST 17 12/28/2017   ALKPHOS 95 12/28/2017   BILITOT 0.3 12/28/2017        Danelle Earthly, MD [facility] 03/21/2023, 10:47 AM  I have personally spent 52 minutes involved in face-to-face and non-face-to-face activities for this patient on the day of the visit. Professional time spent includes the following activities: Preparing to see the patient (review of tests), Obtaining and/or reviewing separately obtained history (admission/discharge record), Performing a medically appropriate examination and/or evaluation , Ordering  medications/tests/procedures, referring and communicating with other health care professionals, Documenting clinical information in the EMR, Independently interpreting results (not separately reported), Communicating results to the patient/family/caregiver, Counseling and educating the patient/family/caregiver and Care coordination (not separately reported).

## 2023-03-21 NOTE — Discharge Instructions (Signed)
The current dressing on your right knee can get wet in the shower daily. That dressing can stay on until your outpatient orthopedic follow-up. Increase your activities as tolerated with full weightbearing as tolerated on the right knee.

## 2023-03-22 DIAGNOSIS — Z87891 Personal history of nicotine dependence: Secondary | ICD-10-CM | POA: Diagnosis not present

## 2023-03-22 DIAGNOSIS — F4000 Agoraphobia, unspecified: Secondary | ICD-10-CM | POA: Diagnosis not present

## 2023-03-22 DIAGNOSIS — E7800 Pure hypercholesterolemia, unspecified: Secondary | ICD-10-CM | POA: Diagnosis not present

## 2023-03-22 DIAGNOSIS — I10 Essential (primary) hypertension: Secondary | ICD-10-CM | POA: Diagnosis not present

## 2023-03-22 DIAGNOSIS — I4891 Unspecified atrial fibrillation: Secondary | ICD-10-CM | POA: Diagnosis not present

## 2023-03-22 DIAGNOSIS — N3281 Overactive bladder: Secondary | ICD-10-CM | POA: Diagnosis not present

## 2023-03-22 DIAGNOSIS — G4733 Obstructive sleep apnea (adult) (pediatric): Secondary | ICD-10-CM | POA: Diagnosis not present

## 2023-03-22 DIAGNOSIS — Z792 Long term (current) use of antibiotics: Secondary | ICD-10-CM | POA: Diagnosis not present

## 2023-03-22 DIAGNOSIS — T8453XA Infection and inflammatory reaction due to internal right knee prosthesis, initial encounter: Secondary | ICD-10-CM | POA: Diagnosis not present

## 2023-03-22 DIAGNOSIS — F341 Dysthymic disorder: Secondary | ICD-10-CM | POA: Diagnosis not present

## 2023-03-22 DIAGNOSIS — M791 Myalgia, unspecified site: Secondary | ICD-10-CM | POA: Diagnosis not present

## 2023-03-22 DIAGNOSIS — Z452 Encounter for adjustment and management of vascular access device: Secondary | ICD-10-CM | POA: Diagnosis not present

## 2023-03-22 DIAGNOSIS — K449 Diaphragmatic hernia without obstruction or gangrene: Secondary | ICD-10-CM | POA: Diagnosis not present

## 2023-03-22 DIAGNOSIS — J449 Chronic obstructive pulmonary disease, unspecified: Secondary | ICD-10-CM | POA: Diagnosis not present

## 2023-03-22 DIAGNOSIS — Z9981 Dependence on supplemental oxygen: Secondary | ICD-10-CM | POA: Diagnosis not present

## 2023-03-22 DIAGNOSIS — Z86718 Personal history of other venous thrombosis and embolism: Secondary | ICD-10-CM | POA: Diagnosis not present

## 2023-03-22 DIAGNOSIS — Z9181 History of falling: Secondary | ICD-10-CM | POA: Diagnosis not present

## 2023-03-22 DIAGNOSIS — M65961 Unspecified synovitis and tenosynovitis, right lower leg: Secondary | ICD-10-CM | POA: Diagnosis not present

## 2023-03-22 DIAGNOSIS — I4819 Other persistent atrial fibrillation: Secondary | ICD-10-CM | POA: Diagnosis not present

## 2023-03-22 DIAGNOSIS — Z7982 Long term (current) use of aspirin: Secondary | ICD-10-CM | POA: Diagnosis not present

## 2023-03-22 DIAGNOSIS — Z86711 Personal history of pulmonary embolism: Secondary | ICD-10-CM | POA: Diagnosis not present

## 2023-03-23 ENCOUNTER — Telehealth: Payer: Self-pay | Admitting: Internal Medicine

## 2023-03-23 ENCOUNTER — Telehealth: Payer: Self-pay | Admitting: Orthopaedic Surgery

## 2023-03-23 NOTE — Telephone Encounter (Signed)
Rosanne Ashing (PT) from [facility] called requesting verbals for 1wk 1, 2wk 4, also requesting weight barring status. Also requesting OT for ABL's. Jim secure [phone].

## 2023-03-23 NOTE — Telephone Encounter (Signed)
Verbal order left on VM

## 2023-03-23 NOTE — Telephone Encounter (Signed)
Heart rates are not alarming   Pt is asymptomatic    Follow

## 2023-03-23 NOTE — Telephone Encounter (Signed)
Spoke with Rosanne Ashing, PT who reports patient's HR has ranged between 44 and 110.  Pt has been asymptomatic and PT would like guidance for provider notification parameters.  Advised PT, Dr Tenny Craw is not in the office today but will forward message for her review and recommendation.  PT verbalizes understanding and agrees with current plan.

## 2023-03-23 NOTE — Telephone Encounter (Signed)
Judy White is requesting a callback from nurse regarding him going to see pt today and her HR was irregular ranging between 44-110 bpm but his question is at what point would the office like to be notified with irregularities. He said a VM can be left if needed since it confidential. Please advise

## 2023-03-24 LAB — AEROBIC/ANAEROBIC CULTURE W GRAM STAIN (SURGICAL/DEEP WOUND): Culture: NO GROWTH

## 2023-03-26 DIAGNOSIS — Z452 Encounter for adjustment and management of vascular access device: Secondary | ICD-10-CM | POA: Diagnosis not present

## 2023-03-27 ENCOUNTER — Encounter: Payer: Self-pay | Admitting: Internal Medicine

## 2023-03-27 DIAGNOSIS — R110 Nausea: Secondary | ICD-10-CM

## 2023-03-28 ENCOUNTER — Telehealth: Payer: Self-pay | Admitting: Orthopaedic Surgery

## 2023-03-28 NOTE — Telephone Encounter (Signed)
Pt need a a call back from Dr Magnus Ivan, Floy Sabina, or Fowler concerning her having diarrhea . She states she really need a call back as soon as possible.Pt called and left message also with infectious disease.  Pt surgery date was 03/16/23.Pt phone number is [phone].

## 2023-03-29 ENCOUNTER — Telehealth: Payer: Self-pay | Admitting: Internal Medicine

## 2023-03-29 ENCOUNTER — Other Ambulatory Visit (HOSPITAL_COMMUNITY): Payer: Self-pay

## 2023-03-29 MED ORDER — ONDANSETRON HCL 4 MG PO TABS
4.0000 mg | ORAL_TABLET | Freq: Three times a day (TID) | ORAL | 1 refills | Status: DC | PRN
  Filled 2023-03-29: qty 20, 7d supply, fill #0

## 2023-03-29 NOTE — Addendum Note (Signed)
Addended by: Jennette Kettle on: 03/29/2023 09:31 AM   Modules accepted: Orders

## 2023-03-29 NOTE — Telephone Encounter (Signed)
Per Dr. Thedore Mins; is she on probioitcs? would recommend trying that over the weekend. if its just 2 bm then could be abx related. especially if not fevre or abdominal pain  Juanita Laster, RMA

## 2023-03-29 NOTE — Telephone Encounter (Signed)
Called and spoke with patient and son Caryn Bee. Pt started having loose stools on Tuesday with fatigue. Pt and son reports she feels better today, diarrhea improved. Denies fever or abdominal pain. Son states pt has  history of episodes of diarrhea on and off for a while(prior to abx being started). Missed one dose of abx so far.  -Counseled to continue abx -Pick up probiotic -Go to ED or come to clinic if : > 3 loose stools in 24hr with abd pain/fever to test for c diff.

## 2023-03-30 ENCOUNTER — Other Ambulatory Visit: Payer: Self-pay

## 2023-03-30 ENCOUNTER — Other Ambulatory Visit (HOSPITAL_COMMUNITY): Payer: Self-pay

## 2023-04-02 ENCOUNTER — Ambulatory Visit (INDEPENDENT_AMBULATORY_CARE_PROVIDER_SITE_OTHER): Payer: PPO | Admitting: Physician Assistant

## 2023-04-02 DIAGNOSIS — M65961 Unspecified synovitis and tenosynovitis, right lower leg: Secondary | ICD-10-CM

## 2023-04-02 DIAGNOSIS — Z452 Encounter for adjustment and management of vascular access device: Secondary | ICD-10-CM | POA: Diagnosis not present

## 2023-04-02 NOTE — Progress Notes (Signed)
HPI: [name] returns today status post right knee I&D with poly exchange.  Cultures and tissue samples sent intraoperative showed no organisms.  She was seen by infectious disease while hospitalized and has a follow-up appointment on 04/04/2023 with infectious disease.  Currently on ceftriaxone and daptomycin.  Had some GI upset/diarrhea which is now under control with over-the-counter medications.  She states overall she is doing well denies any fevers chills.  She is taking oxycodone once daily for the pain.  She is ambulating with a cane.  Presents today with her son.  Physical exam: General Well-developed well-nourished female no acute distress mood affect appropriate. Psych: Alert and oriented x 3 Right knee: Staples well-approximated incision.  No signs of infection.  Calf supple nontender.  Full extension actively and passively.  Flexion to approximately 95 degrees actively.  Dorsiflexion plantarflexion right ankle intact.  Impression: Status post I&D poly exchange right knee  Plan: She will continue to work with home therapy on range of motion strengthening.  She is on oxygen at home as needed.  She feels that the hard for her at this point time to get to physical therapy.  Therefore she will continue work with therapy on range of motion strengthening.  Will see her back in just 2 weeks to see how she is doing overall.  She will follow-up with Dr. Magnus Ivan at that time.  Questions were encouraged and answered at length.  Staples removed Steri-Strips applied.  Discussed scar tissue mobilization.

## 2023-04-04 ENCOUNTER — Ambulatory Visit (INDEPENDENT_AMBULATORY_CARE_PROVIDER_SITE_OTHER): Payer: PPO | Admitting: Internal Medicine

## 2023-04-04 ENCOUNTER — Telehealth: Payer: Self-pay

## 2023-04-04 ENCOUNTER — Encounter: Payer: Self-pay | Admitting: Internal Medicine

## 2023-04-04 ENCOUNTER — Other Ambulatory Visit: Payer: Self-pay

## 2023-04-04 VITALS — BP 118/47 | HR 33 | Temp 98.7°F | Ht 65.0 in | Wt 200.0 lb

## 2023-04-04 DIAGNOSIS — T8453XD Infection and inflammatory reaction due to internal right knee prosthesis, subsequent encounter: Secondary | ICD-10-CM

## 2023-04-04 DIAGNOSIS — T8450XS Infection and inflammatory reaction due to unspecified internal joint prosthesis, sequela: Secondary | ICD-10-CM

## 2023-04-04 NOTE — Progress Notes (Signed)
 Patient: Judy White  DOB: Jul 15, 1940 MRN: [id number] PCP: Dwight Trula SQUIBB, MD   Chief Complaint  Patient presents with   Hospitalization Follow-up     Patient Active Problem List   Diagnosis Date Noted   Synovitis of right knee, questionable infection 03/15/2023   Sleep apnea 06/12/2022   Chronic respiratory failure with hypoxia (HCC) 07/14/2021   Dyspnea on exertion 05/24/2021   Emesis, persistent 05/05/2019   Status post total right knee replacement 12/27/2018   Unilateral primary osteoarthritis, right knee 11/27/2018   Rib fracture 04/22/2018   Pneumothorax on left 01/24/2016   Abnormal CT of the chest    Pulmonary embolism (HCC)    Interstitial lung disease (HCC) 10/08/2015   Paroxysmal atrial fibrillation (HCC)    Typical atrial flutter (HCC)    Chronic diastolic heart failure (HCC) 08/25/2014   Lumbar stenosis 08/11/2014   On amiodarone  therapy 05/21/2013   Hypercholesteremia    Essential hypertension    Myalgia and myositis, unspecified    COPD    Overactive bladder      Subjective:  Judy White is a 83 y.o. female with history of right knee arthroplasty presents for hospital follow-up of right knee PJI.  Patient underwent right knee arthroplasty in 2020 which was complicated by some pain in 2022, MRI was not concerning for infection.  Patient started having right knee pain about 2 days prior to admission.  She underwent aspiration x 2 with 7K WBC, 92% neutrophils, cultures negative.  On arrival she had a peripheral WBC count 28K.  Taken to the OR with orthopedics Dr. Vernetta on 1/17 for right knee debridement and poly exchange.  Or cultures did not note signs of chronic synovial infection.  Cultures remain negative.  Discharged on empiric PJI coverage with daptomycin  and Rocephin  x 6 weeks EOT 2/28.  Since then she has been having some diarrhea.  She states she has chronic diarrhea.  Her bowel movements are becoming more solid.  She is taking Imodium.  She states  that she would not like to take probiotic despite counseling.  No fevers or chills. Review of Systems  All other systems reviewed and are negative.   Past Medical History:  Diagnosis Date   Agoraphobia    Anxiety    Arthritis    normal for the age; nothing gives me problems (01/24/2016)   COPD (chronic obstructive pulmonary disease) (HCC)    mild (01/24/2016)   Depressive disorder, not elsewhere classified    DVT (deep venous thrombosis) (HCC) 10/08/2015   they said the blood clot went from one of my legs to my lung   Dysrhythmia    History of hiatal hernia    small one (01/24/2016)   Hypercholesteremia    Hypertension    pt denies this hx on 01/24/2016   Myalgia and myositis, unspecified    Osteopenia    Overactive bladder    Persistent atrial fibrillation (HCC)    a. s/p Watchman implant 12/2014   Presence of Watchman left atrial appendage closure device    Pulmonary embolism (HCC) 10/08/2015   Sleep apnea    USES CPAP     Outpatient Medications Prior to Visit  Medication Sig Dispense Refill   acetaminophen  (TYLENOL ) 500 MG tablet Take 500-1,000 mg by mouth every 6 (six) hours as needed for moderate pain (pain score 4-6) or headache.     albuterol  (VENTOLIN  HFA) 108 (90 Base) MCG/ACT inhaler Inhale 2 puffs into the lungs every 4 (  four) hours as needed for wheezing or shortness of breath. 8 g 3   Budeson-Glycopyrrol-Formoterol  (BREZTRI  AEROSPHERE) 160-9-4.8 MCG/ACT AERO Inhale 2 puffs into the lungs 2 (two) times daily. 10.7 g 3   Calcium -Vitamin D  (CALTRATE 600 PLUS-VIT D PO) Take 1 tablet by mouth daily.      cefTRIAXone  (ROCEPHIN ) IVPB Inject 2 g into the vein daily. Indication:  R-knee PJI First Dose: Yes Last Day of Therapy:  04/27/23 Labs - Once weekly:  CBC/D and BMP, Labs - Once weekly: ESR and CRP Method of administration: IV Push Method of administration may be changed at the discretion of home infusion pharmacist based upon assessment of the patient  and/or caregiver's ability to self-administer the medication ordered. 37 Units 0   Cholecalciferol  (VITAMIN D3) 2000 units TABS Take 2,000 Units by mouth daily.     clonazePAM  (KLONOPIN ) 1 MG tablet Take 1.5 tablets (1.5 mg total) by mouth in the morning. May also take 0.5 tablets (0.5 mg total) daily as needed. 60 tablet 5   daptomycin  (CUBICIN ) IVPB Inject 600 mg into the vein daily. Indication:  R-knee PJI First Dose: Yes Last Day of Therapy:  04/27/23 Labs - Once weekly:  CBC/D, BMP, ESR and CRP Labs - Twice weekly initially: CPK Method of administration: IV Push Method of administration may be changed at the discretion of home infusion pharmacist based upon assessment of the patient and/or caregiver's ability to self-administer the medication ordered. 37 Units 0   FLUoxetine  (PROZAC ) 20 MG capsule Take 1 capsule (20 mg total) by mouth daily with breakfast. 90 capsule 2   furosemide  (LASIX ) 20 MG tablet Take 1 tablet (20 mg total) by mouth daily. 90 tablet 1   gabapentin  (NEURONTIN ) 100 MG capsule Take 1 capsule (100 mg total) by mouth 3 (three) times daily. 270 capsule 3   metoprolol  succinate (TOPROL -XL) 25 MG 24 hr tablet Take 0.5 tablets (12.5 mg total) by mouth daily. 45 tablet 3   Multiple Vitamins-Minerals (PRESERVISION AREDS PO) Take 1 each by mouth in the morning and at bedtime.     oxybutynin  (DITROPAN -XL) 10 MG 24 hr tablet Take 1 tablet (10 mg total) by mouth daily. 90 tablet 3   oxyCODONE  (OXY IR/ROXICODONE ) 5 MG immediate release tablet Take 1-2 tablets (5-10 mg total) by mouth every 6 (six) hours as needed for moderate pain (pain score 4-6). 30 tablet 0   Potassium 99 MG TABS Take 99 mg by mouth daily.     vitamin C  (ASCORBIC ACID ) 500 MG tablet Take 500 mg by mouth daily.     vitamin E  180 MG (400 UNITS) capsule Take 400 Units daily by mouth.     Alum Hydroxide-Mag Carbonate (GAVISCON PO) Take 2 each by mouth daily as needed.     aspirin  81 MG chewable tablet Chew 1 tablet (81  mg total) by mouth 2 (two) times daily. 30 tablet 0   Loperamide HCl (IMODIUM PO) Take 2 tablets by mouth daily as needed.     ondansetron  (ZOFRAN ) 4 MG tablet Take 1 tablet (4 mg total) by mouth every 8 (eight) hours as needed for nausea or vomiting. 20 tablet 1   No facility-administered medications prior to visit.     Allergies  Allergen Reactions   Bupropion     Other reaction(s): anxiety   Rosuvastatin      Myalgias on 5mg  every other day   Statins     Social History   Tobacco Use   Smoking status: Former  Current packs/day: 0.00    Average packs/day: 0.5 packs/day for 60.0 years (30.0 ttl pk-yrs)    Types: Cigarettes    Start date: 10/08/1955    Quit date: 10/08/2015    Years since quitting: 7.4   Smokeless tobacco: Never  Vaping Use   Vaping status: Never Used  Substance Use Topics   Alcohol use: Yes    Alcohol/week: 0.0 standard drinks of alcohol    Comment: occ   Drug use: No    Family History  Problem Relation Age of Onset   Aneurysm Father    Hypotension Mother    Neuropathy Brother    Heart disease Brother    Cancer Other        maternal side   Heart Problems Other        paternal side   Heart attack Neg Hx     Objective:   Vitals:   04/04/23 1523  BP: (!) 118/47  Pulse: (!) 33  Temp: 98.7 F (37.1 C)  TempSrc: Temporal  Weight: 200 lb (90.7 kg)  Height: 5' 5 (1.651 m)   Body mass index is 33.28 kg/m.  Physical Exam Constitutional:      Appearance: Normal appearance.  HENT:     Head: Normocephalic and atraumatic.     Right Ear: Tympanic membrane normal.     Left Ear: Tympanic membrane normal.     Nose: Nose normal.     Mouth/Throat:     Mouth: Mucous membranes are moist.  Eyes:     Extraocular Movements: Extraocular movements intact.     Conjunctiva/sclera: Conjunctivae normal.     Pupils: Pupils are equal, round, and reactive to light.  Cardiovascular:     Rate and Rhythm: Normal rate and regular rhythm.     Heart sounds: No  murmur heard.    No friction rub. No gallop.  Pulmonary:     Effort: Pulmonary effort is normal.     Breath sounds: Normal breath sounds.  Abdominal:     General: Abdomen is flat.     Palpations: Abdomen is soft.  Skin:    General: Skin is warm and dry.  Neurological:     General: No focal deficit present.     Mental Status: She is alert and oriented to person, place, and time.  Psychiatric:        Mood and Affect: Mood normal.     Lab Results: Lab Results  Component Value Date   WBC 13.6 (H) 03/21/2023   HGB 10.4 (L) 03/21/2023   HCT 32.4 (L) 03/21/2023   MCV 91.0 03/21/2023   PLT 218 03/21/2023    Lab Results  Component Value Date   CREATININE 0.72 03/19/2023   BUN 27 (H) 03/19/2023   NA 136 03/19/2023   K 4.6 03/19/2023   CL 100 03/19/2023   CO2 30 03/19/2023    Lab Results  Component Value Date   ALT 10 12/28/2017   AST 17 12/28/2017   ALKPHOS 95 12/28/2017   BILITOT 0.3 12/28/2017     Assessment & Plan:  #Right knee PJI status post poly exchange, culture negative # History of chronic diarrhea, concern for worsening with antibiotics - Patient underwent aspiration x 2 with elevated WBC count, culture negative.  Admitted with 2-day history of knee pain taken to the OR for poly exchange.  Or findings are not concerning for infection.  Cultures were negative.  Given elevated white cell count and aspiration was discharged on daptomycin  x 6 weeks  for PJI.  Tolerating antibiotics except for she had a brief episode of diarrhea.  Only missed 1 dose of antibiotics -Pt taking immodium and notes diarrhea is resolved. Not on probioitcs. She declines probiotics because does not want ot take another pill. Will try probiotic yogurt.  No fevers, chills, abdominal pain. -Follow-up with Cathlyn July 2/28 at end of treatment.  #Medication monitoring - 04/02/23 WBC 9, creatinine 0.87, ESR 16, CK 33, CRP 17 Loney Stank, MD [facility]   04/04/23  3:42 PM I have personally spent 65 minutes involved in face-to-face and non-face-to-face activities for this patient on the day of the visit. Professional time spent includes the following activities: Preparing to see the patient (review of tests), Obtaining and/or reviewing separately obtained history (admission/discharge record), Performing a medically appropriate examination and/or evaluation , Ordering medications/tests/procedures, referring and communicating with other health care professionals, Documenting clinical information in the EMR, Independently interpreting results (not separately reported), Communicating results to the patient/family/caregiver, Counseling and educating the patient/family/caregiver and Care coordination (not separately reported).

## 2023-04-04 NOTE — Telephone Encounter (Signed)
 Per Dr. Zelda Hickman ok to remove picc line on 04/05/23 Message sent to [employer] with orders. Crystalann Korf Adel Holt, CMA

## 2023-04-05 ENCOUNTER — Other Ambulatory Visit (HOSPITAL_COMMUNITY): Payer: Self-pay

## 2023-04-05 NOTE — Telephone Encounter (Signed)
 Patient son [name] called stating that patient [name] nurse told them they received an order to pull PICC line on patient.   Per Judy White - he was told at appointment on 04/04/2023 with Dr.Singh to continue IV abx until 04/27/2023 at follow up appointment.   Message to [facility]/[employer] staff.   Judy White Judy White, CMA

## 2023-04-06 ENCOUNTER — Other Ambulatory Visit (HOSPITAL_COMMUNITY): Payer: Self-pay

## 2023-04-06 MED ORDER — REPATHA 140 MG/ML ~~LOC~~ SOSY
PREFILLED_SYRINGE | SUBCUTANEOUS | 5 refills | Status: DC
  Filled 2023-04-06: qty 2, 14d supply, fill #0
  Filled 2023-04-06 – 2023-04-26 (×2): qty 2, 28d supply, fill #0
  Filled 2023-05-21: qty 2, 28d supply, fill #1
  Filled 2023-06-16: qty 2, 28d supply, fill #2
  Filled 2023-07-14: qty 2, 28d supply, fill #3
  Filled 2023-08-14: qty 2, 28d supply, fill #4
  Filled 2023-09-10: qty 2, 28d supply, fill #5

## 2023-04-09 DIAGNOSIS — Z452 Encounter for adjustment and management of vascular access device: Secondary | ICD-10-CM | POA: Diagnosis not present

## 2023-04-10 LAB — LAB REPORT - SCANNED: EGFR: 78

## 2023-04-13 ENCOUNTER — Other Ambulatory Visit: Payer: Self-pay

## 2023-04-16 ENCOUNTER — Other Ambulatory Visit (HOSPITAL_COMMUNITY): Payer: Self-pay

## 2023-04-16 DIAGNOSIS — Z452 Encounter for adjustment and management of vascular access device: Secondary | ICD-10-CM | POA: Diagnosis not present

## 2023-04-18 ENCOUNTER — Encounter: Payer: Self-pay | Admitting: Orthopaedic Surgery

## 2023-04-18 ENCOUNTER — Ambulatory Visit: Payer: PPO | Admitting: Orthopaedic Surgery

## 2023-04-18 DIAGNOSIS — Z8739 Personal history of other diseases of the musculoskeletal system and connective tissue: Secondary | ICD-10-CM

## 2023-04-18 DIAGNOSIS — Z9889 Other specified postprocedural states: Secondary | ICD-10-CM

## 2023-04-18 DIAGNOSIS — M65961 Unspecified synovitis and tenosynovitis, right lower leg: Secondary | ICD-10-CM

## 2023-04-18 NOTE — Progress Notes (Signed)
The patient is getting close to 5 weeks status post an incision and drainage with poly liner exchange and synovectomy of her right total knee replacement.  She has had an acute infection of that knee and there was a large white blood cell count of the knee but still surprised and no organisms.  She has been followed by the infectious disease clinic and has a PICC line in place.  She is scheduled to have antibiotics through her PICC line up until a bleed February 28.  Her right knee looks great today.  The incision is healed over nicely.  There is no redness.  There is no effusion.  She has good range of motion of that right knee to feel stable.  Her son who is a IT sales professional is with her today.  She understands that we both agree that she can drive but just short distances.  She is not taking pain medication and overall looks good.  She continues to have home therapy for her PICC line but also PT to work with her balance and coordination as well as strengthening.  I am fine with physical therapy continuing for the next month if that can be arranged and if they need more ordering of that.  From my standpoint, we will see her back in 4 weeks and I would like a standing AP and lateral of her right knee at that visit.

## 2023-04-19 ENCOUNTER — Other Ambulatory Visit (HOSPITAL_COMMUNITY): Payer: Self-pay

## 2023-04-19 ENCOUNTER — Other Ambulatory Visit: Payer: Self-pay | Admitting: Orthopaedic Surgery

## 2023-04-19 ENCOUNTER — Other Ambulatory Visit: Payer: Self-pay

## 2023-04-19 ENCOUNTER — Telehealth: Payer: Self-pay

## 2023-04-19 MED ORDER — HYDROCODONE-ACETAMINOPHEN 5-325 MG PO TABS
1.0000 | ORAL_TABLET | Freq: Three times a day (TID) | ORAL | 0 refills | Status: DC | PRN
  Filled 2023-04-19 (×2): qty 30, 10d supply, fill #0

## 2023-04-19 NOTE — Telephone Encounter (Signed)
Judy White, HHPT called stating that patient has declined within the last 12hrs.  Stated that patient was doing fantastic, until last night with severe right knee pain.  Right knee is hot, no fever BP low, headache, some confusion, and loose stool.  Currently out of Oxycodone, but has been taken Tylenol.  Please call patients son Judy White at [phone].  Thank you

## 2023-04-23 DIAGNOSIS — Z452 Encounter for adjustment and management of vascular access device: Secondary | ICD-10-CM | POA: Diagnosis not present

## 2023-04-26 ENCOUNTER — Other Ambulatory Visit (HOSPITAL_COMMUNITY): Payer: Self-pay

## 2023-04-26 ENCOUNTER — Other Ambulatory Visit (HOSPITAL_COMMUNITY): Payer: Self-pay | Admitting: Psychiatry

## 2023-04-27 ENCOUNTER — Encounter: Payer: Self-pay | Admitting: Family

## 2023-04-27 ENCOUNTER — Other Ambulatory Visit: Payer: Self-pay

## 2023-04-27 ENCOUNTER — Ambulatory Visit: Payer: PPO | Admitting: Family

## 2023-04-27 VITALS — BP 142/60 | HR 54 | Temp 97.7°F | Wt 193.0 lb

## 2023-04-27 DIAGNOSIS — T8453XD Infection and inflammatory reaction due to internal right knee prosthesis, subsequent encounter: Secondary | ICD-10-CM | POA: Diagnosis not present

## 2023-04-27 DIAGNOSIS — Z452 Encounter for adjustment and management of vascular access device: Secondary | ICD-10-CM | POA: Insufficient documentation

## 2023-04-27 DIAGNOSIS — T8453XA Infection and inflammatory reaction due to internal right knee prosthesis, initial encounter: Secondary | ICD-10-CM | POA: Insufficient documentation

## 2023-04-27 MED ORDER — DOXYCYCLINE HYCLATE 100 MG PO TABS
100.0000 mg | ORAL_TABLET | Freq: Two times a day (BID) | ORAL | 1 refills | Status: DC
  Filled 2023-04-27: qty 60, 30d supply, fill #0
  Filled 2023-05-24: qty 60, 30d supply, fill #1

## 2023-04-27 MED ORDER — CEFADROXIL 500 MG PO CAPS
500.0000 mg | ORAL_CAPSULE | Freq: Two times a day (BID) | ORAL | 1 refills | Status: DC
  Filled 2023-04-27: qty 60, 30d supply, fill #0
  Filled 2023-05-24: qty 60, 30d supply, fill #1

## 2023-04-27 NOTE — Patient Instructions (Addendum)
 Nice to see you.  PICC line removal on Monday.  Start Cefadroxil and doxycycline.  Plan for follow up in 1 months or sooner if needed with lab work on the same day.  Have a great day and stay safe!

## 2023-04-27 NOTE — Progress Notes (Signed)
 Pull picc orders forwarded to [employer] team. IV antbx end date 2/28.

## 2023-04-27 NOTE — Assessment & Plan Note (Signed)
 PICC line in place without evidence of infection or complication and functioning appropriately.  Dressing is clean and dry.  Continue PICC line management per protocol until removal scheduled for 04/30/2023.

## 2023-04-27 NOTE — Assessment & Plan Note (Signed)
 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  Discussed plan of care to transition to oral antibiotics with doxycycline and cefadroxil.  Reviewed course of infection and that there is no definitive test of cure towards the end of treatment.  Will plan to follow-up in 1 month or sooner if needed to check adherence and tolerance to oral regimen.

## 2023-04-27 NOTE — Progress Notes (Signed)
 Subjective:    Patient ID: Judy White, female    DOB: [birth year], 83 y.o.   MRN: [id number]  Chief Complaint  Patient presents with   Prosthetic Joint Infection    HPI:  Judy White is a 83 y.o. female with right knee culture negative prosthetic joint infection status post debridement and poly exchange on daptomycin and ceftriaxone last seen by Dr. Thedore Mins on 04/04/2023 with good adherence and tolerance to daptomycin and ceftriaxone.  Presenting today for follow-up.  Judy White is present today with her son.  Has been doing well since her last office visit and continues to receive daptomycin and ceftriaxone with the last dose of medication scheduled for today.  PICC line is planned to be removed on Monday.  Having some looser stools that is improved with align and Imodium.  Knee is feeling better.  PICC line functioning appropriately with no complications or signs of infection.  Denies fevers, chills, or sweats.  Has questions about overall plan of care and infection   Allergies  Allergen Reactions   Bupropion     Other reaction(s): anxiety   Rosuvastatin     Myalgias on 5mg  every other day   Statins       Outpatient Medications Prior to Visit  Medication Sig Dispense Refill   acetaminophen (TYLENOL) 500 MG tablet Take 500-1,000 mg by mouth every 6 (six) hours as needed for moderate pain (pain score 4-6) or headache.     albuterol (VENTOLIN HFA) 108 (90 Base) MCG/ACT inhaler Inhale 2 puffs into the lungs every 4 (four) hours as needed for wheezing or shortness of breath. 8 g 3   Budeson-Glycopyrrol-Formoterol (BREZTRI AEROSPHERE) 160-9-4.8 MCG/ACT AERO Inhale 2 puffs into the lungs 2 (two) times daily. 10.7 g 3   Calcium-Vitamin D (CALTRATE 600 PLUS-VIT D PO) Take 1 tablet by mouth daily.      Cholecalciferol (VITAMIN D3) 2000 units TABS Take 2,000 Units by mouth daily.     clonazePAM (KLONOPIN) 1 MG tablet Take 1.5 tablets (1.5 mg total) by mouth in the morning. May also take 0.5  tablets (0.5 mg total) daily as needed. 60 tablet 5   Evolocumab (REPATHA) 140 MG/ML SOSY Inject 140 mg into the skin every 14 (fourteen) days. 2 mL 5   FLUoxetine (PROZAC) 20 MG capsule Take 1 capsule (20 mg total) by mouth daily with breakfast. 90 capsule 2   furosemide (LASIX) 20 MG tablet Take 1 tablet (20 mg total) by mouth daily. 90 tablet 1   HYDROcodone-acetaminophen (NORCO/VICODIN) 5-325 MG tablet Take 1 tablet by mouth 3 (three) times daily as needed for moderate pain (pain score 4-6). 30 tablet 0   metoprolol succinate (TOPROL-XL) 25 MG 24 hr tablet Take 0.5 tablets (12.5 mg total) by mouth daily. 45 tablet 3   Multiple Vitamins-Minerals (PRESERVISION AREDS PO) Take 1 each by mouth in the morning and at bedtime.     oxybutynin (DITROPAN-XL) 10 MG 24 hr tablet Take 1 tablet (10 mg total) by mouth daily. 90 tablet 3   Potassium 99 MG TABS Take 99 mg by mouth daily.     vitamin C (ASCORBIC ACID) 500 MG tablet Take 500 mg by mouth daily.     vitamin E 180 MG (400 UNITS) capsule Take 400 Units daily by mouth.     cefTRIAXone (ROCEPHIN) IVPB Inject 2 g into the vein daily. Indication:  R-knee PJI First Dose: Yes Last Day of Therapy:  04/27/23 Labs - Once weekly:  CBC/D  and BMP, Labs - Once weekly: ESR and CRP Method of administration: IV Push Method of administration may be changed at the discretion of home infusion pharmacist based upon assessment of the patient and/or caregiver's ability to self-administer the medication ordered. 37 Units 0   daptomycin (CUBICIN) IVPB Inject 600 mg into the vein daily. Indication:  R-knee PJI First Dose: Yes Last Day of Therapy:  04/27/23 Labs - Once weekly:  CBC/D, BMP, ESR and CRP Labs - Twice weekly initially: CPK Method of administration: IV Push Method of administration may be changed at the discretion of home infusion pharmacist based upon assessment of the patient and/or caregiver's ability to self-administer the medication ordered. 37 Units 0    gabapentin (NEURONTIN) 100 MG capsule Take 1 capsule (100 mg total) by mouth 3 (three) times daily. 270 capsule 3   oxyCODONE (OXY IR/ROXICODONE) 5 MG immediate release tablet Take 1-2 tablets (5-10 mg total) by mouth every 6 (six) hours as needed for moderate pain (pain score 4-6). 30 tablet 0   No facility-administered medications prior to visit.     Past Medical History:  Diagnosis Date   Agoraphobia    Anxiety    Arthritis    "normal for the age; nothing gives me problems" (01/24/2016)   COPD (chronic obstructive pulmonary disease) (HCC)    "mild" (01/24/2016)   Depressive disorder, not elsewhere classified    DVT (deep venous thrombosis) (HCC) 10/08/2015   "they said the blood clot went from one of my legs to my lung"   Dysrhythmia    History of hiatal hernia    "small one" (01/24/2016)   Hypercholesteremia    Hypertension    pt denies this hx on 01/24/2016   Myalgia and myositis, unspecified    Osteopenia    Overactive bladder    Persistent atrial fibrillation (HCC)    a. s/p Watchman implant 12/2014   Presence of Watchman left atrial appendage closure device    Pulmonary embolism (HCC) 10/08/2015   Sleep apnea    USES CPAP      Past Surgical History:  Procedure Laterality Date   APPENDECTOMY  1983   ATRIAL FIBRILLATION ABLATION  06/28/2017   PER PATIENT SUCCESSFUL ABLATION , [facility] , LOV JANUARY 2020   BACK SURGERY     BRONCHOSCOPY  01/24/2016   CARDIAC CATHETERIZATION     >5 yrs   CATARACT EXTRACTION W/ INTRAOCULAR LENS  IMPLANT, BILATERAL Bilateral    CESAREAN SECTION  1967   I & D KNEE WITH POLY EXCHANGE Right 03/16/2023   Procedure: IRRIGATION AND DEBRIDEMENT KNEE WITH POLY EXCHANGE;  Surgeon: Kathryne Hitch, MD;  Location: [facility];  Service: Orthopedics;  Laterality: Right;   LEFT ATRIAL APPENDAGE OCCLUSION N/A 12/31/2014   Procedure: LEFT ATRIAL APPENDAGE OCCLUSION;  Surgeon: Hillis Range, MD;  Location: [facility] INVASIVE CV LAB;   Service: Cardiovascular;  Laterality: N/A;   POSTERIOR FUSION LUMBAR SPINE Bilateral 07/2014   SHOULDER ARTHROSCOPY W/ ROTATOR CUFF REPAIR Left    SHOULDER SURGERY Right    "tendon broke; couldn't be repaired"   TEE WITHOUT CARDIOVERSION N/A 12/22/2014   Procedure: TRANSESOPHAGEAL ECHOCARDIOGRAM (TEE);  Surgeon: Chrystie Nose, MD;  Location: [facility] ENDOSCOPY;  Service: Cardiovascular;  Laterality: N/A;   TEE WITHOUT CARDIOVERSION N/A 02/16/2015   post Watchman TEE with good seal and no leak around device    TONSILLECTOMY     TOOTH EXTRACTION  05/2018   TOTAL ABDOMINAL HYSTERECTOMY  TOTAL KNEE ARTHROPLASTY Right 12/27/2018   Procedure: RIGHT TOTAL KNEE ARTHROPLASTY;  Surgeon: Kathryne Hitch, MD;  Location: [facility];  Service: Orthopedics;  Laterality: Right;   VIDEO BRONCHOSCOPY Bilateral 01/24/2016   Procedure: VIDEO BRONCHOSCOPY WITH FLUORO;  Surgeon: Leslye Peer, MD;  Location: [facility] ENDOSCOPY;  Service: Cardiopulmonary;  Laterality: Bilateral;   WISDOM TOOTH EXTRACTION         Review of Systems  Constitutional:  Negative for chills, diaphoresis, fatigue and fever.  Respiratory:  Negative for cough, chest tightness, shortness of breath and wheezing.   Cardiovascular:  Negative for chest pain.  Gastrointestinal:  Negative for abdominal pain, diarrhea, nausea and vomiting.      Objective:    BP (!) 142/60   Pulse (!) 54   Temp 97.7 F (36.5 C) (Oral)   Wt 193 lb (87.5 kg)   SpO2 94%   BMI 32.12 kg/m  Nursing note and vital signs reviewed.  Physical Exam Constitutional:      General: She is not in acute distress.    Appearance: She is well-developed.  Cardiovascular:     Rate and Rhythm: Normal rate and regular rhythm.     Heart sounds: Normal heart sounds.  Pulmonary:     Effort: Pulmonary effort is normal.     Breath sounds: Normal breath sounds.  Skin:    General: Skin is warm and dry.  Neurological:     Mental Status: She is alert and oriented to  person, place, and time.  Psychiatric:        Behavior: Behavior normal.        Thought Content: Thought content normal.        Judgment: Judgment normal.         04/04/2023    3:26 PM 10/25/2015   10:48 AM 10/25/2015   10:39 AM  Depression screen PHQ 2/9  Decreased Interest 0 0 0  Down, Depressed, Hopeless 1 0 0  PHQ - 2 Score 1 0 0       Assessment & Plan:    Patient Active Problem List   Diagnosis Date Noted   Infection of prosthetic right knee joint (HCC) 04/27/2023   PICC (peripherally inserted central catheter) in place 04/27/2023   Synovitis of right knee, questionable infection 03/15/2023   Sleep apnea 06/12/2022   Chronic respiratory failure with hypoxia (HCC) 07/14/2021   Dyspnea on exertion 05/24/2021   Emesis, persistent 05/05/2019   Status post total right knee replacement 12/27/2018   Unilateral primary osteoarthritis, right knee 11/27/2018   Rib fracture 04/22/2018   Pneumothorax on left 01/24/2016   Abnormal CT of the chest    Pulmonary embolism (HCC)    Interstitial lung disease (HCC) 10/08/2015   Paroxysmal atrial fibrillation (HCC)    Typical atrial flutter (HCC)    Chronic diastolic heart failure (HCC) 08/25/2014   Lumbar stenosis 08/11/2014   On amiodarone therapy 05/21/2013   Hypercholesteremia    Essential hypertension    Myalgia and myositis, unspecified    COPD    Overactive bladder      Problem List Items Addressed This Visit       Musculoskeletal and Integument   Infection of prosthetic right knee joint (HCC) - Primary   Judy White is nearing the completion of her IV portion of treatment for right knee culture-negative prosthetic joint infection with daptomycin and ceftriaxone.  Discussed plan of care to transition to oral antibiotics with doxycycline and cefadroxil.  Reviewed course of infection and  that there is no definitive test of cure towards the end of treatment.  Will plan to follow-up in 1 month or sooner if needed to check  adherence and tolerance to oral regimen.      Relevant Medications   cefadroxil (DURICEF) 500 MG capsule     Other   PICC (peripherally inserted central catheter) in place   PICC line in place without evidence of infection or complication and functioning appropriately.  Dressing is clean and dry.  Continue PICC line management per protocol until removal scheduled for 04/30/2023.        I have discontinued Shoni Quijas. Pryce's daptomycin and cefTRIAXone. I am also having her start on doxycycline and cefadroxil. Additionally, I am having her maintain her Calcium-Vitamin D (CALTRATE 600 PLUS-VIT D PO), acetaminophen, ascorbic acid, vitamin E, Vitamin D3, Potassium, albuterol, oxybutynin, FLUoxetine, clonazePAM, metoprolol succinate, gabapentin, Breztri Aerosphere, furosemide, Multiple Vitamins-Minerals (PRESERVISION AREDS PO), oxyCODONE, Repatha, and HYDROcodone-acetaminophen.   Meds ordered this encounter  Medications   doxycycline (VIBRA-TABS) 100 MG tablet    Sig: Take 1 tablet (100 mg total) by mouth 2 (two) times daily.    Dispense:  60 tablet    Refill:  1    Please mail    Supervising Provider:   Judyann Munson [[id number]]   cefadroxil (DURICEF) 500 MG capsule    Sig: Take 1 capsule (500 mg total) by mouth 2 (two) times daily.    Dispense:  60 capsule    Refill:  1    Please Mail    Supervising Provider:   Judyann Munson [[id number]]     Follow-up: Return in about 1 month (around 05/25/2023). or sooner if needed.   Marcos Eke, MSN, FNP-C Nurse Practitioner [facility] for Infectious Disease [facility] Main number: [phone]

## 2023-05-02 ENCOUNTER — Telehealth: Payer: Self-pay

## 2023-05-02 NOTE — Telephone Encounter (Signed)
 Patient's nurse called and LVM for pharmacy regarding patient  and issues with the oral abx.  I have reached out to the patient.  She reports having diarrhea since starting the oral abx.  She report diarrhea 3 times a days.  Patient staying hydrated.  Patient taking OTC Imodium with little relief.  She reports taking take 4-6 tablets of the Imodium.  Patient reports she was also having lose stool with the IV abx as well but not the way she is now.  Patient mentioned the [facility] nurse stated she may be taking the medications to close together.  If someone can reach out to the patient or the nurse please.

## 2023-05-03 ENCOUNTER — Other Ambulatory Visit (HOSPITAL_COMMUNITY): Payer: Self-pay

## 2023-05-03 ENCOUNTER — Other Ambulatory Visit (HOSPITAL_COMMUNITY): Payer: Self-pay | Admitting: Psychiatry

## 2023-05-03 NOTE — Telephone Encounter (Signed)
 She can come in for c diff and gip testing, hold the doxy. It;'s unlikly due to doxy, I suspect liklly an exacerbation of chronic condition.

## 2023-05-03 NOTE — Telephone Encounter (Signed)
 I spoke to the patient she reports she is feeling better today. Not much diarrhea.  Patient advised she will see if the diarrhea improves over the weekend before doing the stool sample. She will call Monday or send a mychart message to let us know if any improvement. Consepcion Utt Jonathon Resides, CMA

## 2023-05-04 ENCOUNTER — Other Ambulatory Visit (HOSPITAL_COMMUNITY): Payer: Self-pay

## 2023-05-04 ENCOUNTER — Other Ambulatory Visit: Payer: Self-pay

## 2023-05-04 MED ORDER — CLONAZEPAM 1 MG PO TABS
1.0000 mg | ORAL_TABLET | Freq: Two times a day (BID) | ORAL | 3 refills | Status: AC
  Filled 2023-05-04: qty 60, 30d supply, fill #0
  Filled 2023-05-24 – 2023-07-14 (×3): qty 60, 30d supply, fill #1

## 2023-05-05 ENCOUNTER — Other Ambulatory Visit (HOSPITAL_COMMUNITY): Payer: Self-pay

## 2023-05-07 ENCOUNTER — Other Ambulatory Visit: Payer: Self-pay

## 2023-05-07 NOTE — Telephone Encounter (Signed)
 Patient called back stating everything is back to normal. If something else occurs before her next appt she will give [place] a call.  Zuriel Yeaman Jonathon Resides, CMA

## 2023-05-20 DIAGNOSIS — G4733 Obstructive sleep apnea (adult) (pediatric): Secondary | ICD-10-CM | POA: Diagnosis not present

## 2023-05-20 DIAGNOSIS — J449 Chronic obstructive pulmonary disease, unspecified: Secondary | ICD-10-CM | POA: Diagnosis not present

## 2023-05-20 DIAGNOSIS — J9611 Chronic respiratory failure with hypoxia: Secondary | ICD-10-CM | POA: Diagnosis not present

## 2023-05-21 ENCOUNTER — Other Ambulatory Visit (HOSPITAL_COMMUNITY): Payer: Self-pay

## 2023-05-22 ENCOUNTER — Other Ambulatory Visit (HOSPITAL_COMMUNITY): Payer: Self-pay

## 2023-05-22 ENCOUNTER — Ambulatory Visit: Admitting: Nurse Practitioner

## 2023-05-22 DIAGNOSIS — G4733 Obstructive sleep apnea (adult) (pediatric): Secondary | ICD-10-CM | POA: Diagnosis not present

## 2023-05-23 ENCOUNTER — Ambulatory Visit: Payer: PPO | Admitting: Orthopaedic Surgery

## 2023-05-23 ENCOUNTER — Other Ambulatory Visit (INDEPENDENT_AMBULATORY_CARE_PROVIDER_SITE_OTHER): Payer: Self-pay

## 2023-05-23 ENCOUNTER — Encounter: Payer: Self-pay | Admitting: Orthopaedic Surgery

## 2023-05-23 DIAGNOSIS — Z8739 Personal history of other diseases of the musculoskeletal system and connective tissue: Secondary | ICD-10-CM

## 2023-05-23 DIAGNOSIS — Z96651 Presence of right artificial knee joint: Secondary | ICD-10-CM

## 2023-05-23 NOTE — Progress Notes (Signed)
 The patient is now over 2 months out from an incision and drainage with an open arthrotomy and poly liner exchange of an acute infection involving her right total knee replacement.  She had not been on any antibiotics prior to the surgery and surprisingly nothing grew out of what we saw in the knee that look like gross purulence.  She is ambulate with a cane.  She is worried about infection again.  She is now on oral antibiotics and they are causing a lot of diarrhea with her as well as mouth soreness.  Examination of her right knee today shows excellent range of motion.  There is only minimal swelling in the right knee and there is no redness.  I did anesthetize the knee with lidocaine through the superior lateral aspect and placed a needle in the and drained about 10 cc of fluid from the knee that was consistent with postoperative changes.  It was all within fluid and no evidence of purulence or infection.  X-rays of her right knee today show well-seated total knee arthroplasty with no worrisome findings.  From my standpoint the next time we need to see her is not for 3 months.  I will defer antibiotic treatment induration to the ID specialist.  In 3 months from now we do not need x-rays.  If things worsen though she knows to let us know.

## 2023-05-24 ENCOUNTER — Other Ambulatory Visit (HOSPITAL_COMMUNITY): Payer: Self-pay

## 2023-05-24 ENCOUNTER — Other Ambulatory Visit: Payer: Self-pay

## 2023-05-28 ENCOUNTER — Other Ambulatory Visit (HOSPITAL_COMMUNITY): Payer: Self-pay

## 2023-05-28 DIAGNOSIS — F3341 Major depressive disorder, recurrent, in partial remission: Secondary | ICD-10-CM | POA: Diagnosis not present

## 2023-05-28 MED ORDER — CLONAZEPAM 1 MG PO TABS
1.0000 mg | ORAL_TABLET | Freq: Two times a day (BID) | ORAL | 3 refills | Status: DC
  Filled 2023-06-01: qty 60, 30d supply, fill #0
  Filled 2023-07-14 – 2023-08-14 (×2): qty 60, 30d supply, fill #1
  Filled 2023-09-10: qty 60, 30d supply, fill #2

## 2023-05-28 MED ORDER — FLUOXETINE HCL 20 MG PO CAPS
20.0000 mg | ORAL_CAPSULE | Freq: Every morning | ORAL | 2 refills | Status: DC
  Filled 2023-05-28 – 2023-07-14 (×2): qty 90, 90d supply, fill #0

## 2023-05-29 ENCOUNTER — Other Ambulatory Visit: Payer: Self-pay

## 2023-05-29 ENCOUNTER — Other Ambulatory Visit (HOSPITAL_COMMUNITY): Payer: Self-pay

## 2023-05-30 ENCOUNTER — Other Ambulatory Visit: Payer: Self-pay

## 2023-05-30 ENCOUNTER — Other Ambulatory Visit (HOSPITAL_BASED_OUTPATIENT_CLINIC_OR_DEPARTMENT_OTHER): Payer: Self-pay

## 2023-05-30 ENCOUNTER — Encounter: Payer: Self-pay | Admitting: Internal Medicine

## 2023-05-30 ENCOUNTER — Other Ambulatory Visit (HOSPITAL_COMMUNITY): Payer: Self-pay

## 2023-05-30 ENCOUNTER — Ambulatory Visit: Payer: PPO | Admitting: Internal Medicine

## 2023-05-30 VITALS — BP 107/64 | HR 76 | Temp 97.7°F | Wt 188.8 lb

## 2023-05-30 DIAGNOSIS — B370 Candidal stomatitis: Secondary | ICD-10-CM | POA: Diagnosis not present

## 2023-05-30 DIAGNOSIS — T8453XD Infection and inflammatory reaction due to internal right knee prosthesis, subsequent encounter: Secondary | ICD-10-CM

## 2023-05-30 DIAGNOSIS — Z8619 Personal history of other infectious and parasitic diseases: Secondary | ICD-10-CM | POA: Diagnosis not present

## 2023-05-30 DIAGNOSIS — T8450XS Infection and inflammatory reaction due to unspecified internal joint prosthesis, sequela: Secondary | ICD-10-CM | POA: Diagnosis not present

## 2023-05-30 DIAGNOSIS — R197 Diarrhea, unspecified: Secondary | ICD-10-CM

## 2023-05-30 MED ORDER — FLUCONAZOLE 100 MG PO TABS
200.0000 mg | ORAL_TABLET | Freq: Every day | ORAL | 0 refills | Status: AC
Start: 2023-05-30 — End: 2023-06-06
  Filled 2023-05-30 (×2): qty 14, 7d supply, fill #0

## 2023-05-30 MED ORDER — NYSTATIN 100000 UNIT/GM EX POWD
1.0000 | Freq: Three times a day (TID) | CUTANEOUS | 0 refills | Status: DC
  Filled 2023-05-30 (×2): qty 15, 5d supply, fill #0

## 2023-05-30 NOTE — Progress Notes (Signed)
 Patient Active Problem List   Diagnosis Date Noted   Infection of prosthetic right knee joint (HCC) 04/27/2023   PICC (peripherally inserted central catheter) in place 04/27/2023   Synovitis of right knee, questionable infection 03/15/2023   Sleep apnea 06/12/2022   Chronic respiratory failure with hypoxia (HCC) 07/14/2021   Dyspnea on exertion 05/24/2021   Emesis, persistent 05/05/2019   Status post total right knee replacement 12/27/2018   Unilateral primary osteoarthritis, right knee 11/27/2018   Rib fracture 04/22/2018   Pneumothorax on left 01/24/2016   Abnormal CT of the chest    Pulmonary embolism (HCC)    Interstitial lung disease (HCC) 10/08/2015   Paroxysmal atrial fibrillation (HCC)    Typical atrial flutter (HCC)    Chronic diastolic heart failure (HCC) 08/25/2014   Lumbar stenosis 08/11/2014   On amiodarone therapy 05/21/2013   Hypercholesteremia    Essential hypertension    Myalgia and myositis, unspecified    COPD    Overactive bladder     Patient's Medications  New Prescriptions   No medications on file  Previous Medications   ACETAMINOPHEN (TYLENOL) 500 MG TABLET    Take 500-1,000 mg by mouth every 6 (six) hours as needed for moderate pain (pain score 4-6) or headache.   ALBUTEROL (VENTOLIN HFA) 108 (90 BASE) MCG/ACT INHALER    Inhale 2 puffs into the lungs every 4 (four) hours as needed for wheezing or shortness of breath.   BUDESON-GLYCOPYRROL-FORMOTEROL (BREZTRI AEROSPHERE) 160-9-4.8 MCG/ACT AERO    Inhale 2 puffs into the lungs 2 (two) times daily.   CALCIUM-VITAMIN D (CALTRATE 600 PLUS-VIT D PO)    Take 1 tablet by mouth daily.    CEFADROXIL (DURICEF) 500 MG CAPSULE    Take 1 capsule (500 mg total) by mouth 2 (two) times daily.   CHOLECALCIFEROL (VITAMIN D3) 2000 UNITS TABS    Take 2,000 Units by mouth daily.   CLONAZEPAM (KLONOPIN) 1 MG TABLET    Take 1.5 tablets (1.5 mg total) by mouth in the morning. May also take 0.5 tablets (0.5 mg  total) daily as needed.   CLONAZEPAM (KLONOPIN) 1 MG TABLET    Take 1 tablet (1 mg total) by mouth 2 (two) times daily.   CLONAZEPAM (KLONOPIN) 1 MG TABLET    Take 1 tablet (1 mg total) by mouth 2 (two) times daily.   DOXYCYCLINE (VIBRA-TABS) 100 MG TABLET    Take 1 tablet (100 mg total) by mouth 2 (two) times daily.   EVOLOCUMAB (REPATHA) 140 MG/ML SOSY    Inject 140 mg into the skin every 14 (fourteen) days.   FLUOXETINE (PROZAC) 20 MG CAPSULE    Take 1 capsule (20 mg total) by mouth daily with breakfast.   FLUOXETINE (PROZAC) 20 MG CAPSULE    Take 1 capsule (20 mg total) by mouth in the morning with food   FUROSEMIDE (LASIX) 20 MG TABLET    Take 1 tablet (20 mg total) by mouth daily.   HYDROCODONE-ACETAMINOPHEN (NORCO/VICODIN) 5-325 MG TABLET    Take 1 tablet by mouth 3 (three) times daily as needed for moderate pain (pain score 4-6).   METOPROLOL SUCCINATE (TOPROL-XL) 25 MG 24 HR TABLET    Take 0.5 tablets (12.5 mg total) by mouth daily.   MULTIPLE VITAMINS-MINERALS (PRESERVISION AREDS PO)    Take 1 each by mouth in the morning and at bedtime.   OXYBUTYNIN (DITROPAN-XL) 10 MG 24 HR TABLET    Take 1  tablet (10 mg total) by mouth daily.   POTASSIUM 99 MG TABS    Take 99 mg by mouth daily.   VITAMIN C (ASCORBIC ACID) 500 MG TABLET    Take 500 mg by mouth daily.   VITAMIN E 180 MG (400 UNITS) CAPSULE    Take 400 Units daily by mouth.  Modified Medications   No medications on file  Discontinued Medications   No medications on file    Subjective: Judy White is a 83 y.o. female with history of right knee arthroplasty presents for hospital follow-up of right knee PJI.  Patient underwent right knee arthroplasty in 2020 which was complicated by some pain in 2022, MRI was not concerning for infection.  Patient started having right knee pain about 2 days prior to admission.  She underwent aspiration x 2 with 7K WBC, 92% neutrophils, cultures negative.  On arrival she had a peripheral WBC count 28K.   Taken to the OR with orthopedics Dr. Magnus Ivan on 1/17 for right knee debridement and poly exchange.  Or cultures did not note signs of chronic synovial infection.  Cultures remain negative.  Discharged on empiric PJI coverage with daptomycin and Rocephin x 6 weeks EOT 2/28.  Since then she has been having some diarrhea.  She states she has chronic diarrhea.  Her bowel movements are becoming more solid.  She is taking Imodium.  She states that she would not like to take probiotic despite counseling.  No fevers or chills.  Today 05/30/23: pt continues have diarrhea on doxy + cefadroxil.    Review of Systems: Review of Systems  All other systems reviewed and are negative.   Past Medical History:  Diagnosis Date   Agoraphobia    Anxiety    Arthritis    "normal for the age; nothing gives me problems" (01/24/2016)   COPD (chronic obstructive pulmonary disease) (HCC)    "mild" (01/24/2016)   Depressive disorder, not elsewhere classified    DVT (deep venous thrombosis) (HCC) 10/08/2015   "they said the blood clot went from one of my legs to my lung"   Dysrhythmia    History of hiatal hernia    "small one" (01/24/2016)   Hypercholesteremia    Hypertension    pt denies this hx on 01/24/2016   Myalgia and myositis, unspecified    Osteopenia    Overactive bladder    Persistent atrial fibrillation (HCC)    a. s/p Watchman implant 12/2014   Presence of Watchman left atrial appendage closure device    Pulmonary embolism (HCC) 10/08/2015   Sleep apnea    USES CPAP     Social History   Tobacco Use   Smoking status: Former    Current packs/day: 0.00    Average packs/day: 0.5 packs/day for 60.0 years (30.0 ttl pk-yrs)    Types: Cigarettes    Start date: 10/08/1955    Quit date: 10/08/2015    Years since quitting: 7.6   Smokeless tobacco: Never  Vaping Use   Vaping status: Never Used  Substance Use Topics   Alcohol use: Yes    Alcohol/week: 0.0 standard drinks of alcohol    Comment: occ    Drug use: No    Family History  Problem Relation Age of Onset   Aneurysm Father    Hypotension Mother    Neuropathy Brother    Heart disease Brother    Cancer Other        maternal side   Heart Problems Other  paternal side   Heart attack Neg Hx     Allergies  Allergen Reactions   Bupropion     Other reaction(s): anxiety   Rosuvastatin     Myalgias on 5mg  every other day   Statins     Health Maintenance  Topic Date Due   DEXA SCAN  Never done   DTaP/Tdap/Td (3 - Tdap) 05/16/2019   COVID-19 Vaccine (5 - 2024-25 season) 10/29/2022   INFLUENZA VACCINE  09/28/2023   Medicare Annual Wellness (AWV)  11/10/2023   Pneumonia Vaccine 27+ Years old  Completed   Zoster Vaccines- Shingrix  Completed   HPV VACCINES  Aged Out    Objective:  Vitals:   05/30/23 1053  Weight: 188 lb 12.8 oz (85.6 kg)   Body mass index is 31.42 kg/m.  Physical Exam Constitutional:      Appearance: Normal appearance.  HENT:     Head: Normocephalic and atraumatic.     Right Ear: Tympanic membrane normal.     Left Ear: Tympanic membrane normal.     Nose: Nose normal.     Mouth/Throat:     Mouth: Mucous membranes are moist.  Eyes:     Extraocular Movements: Extraocular movements intact.     Conjunctiva/sclera: Conjunctivae normal.     Pupils: Pupils are equal, round, and reactive to light.  Cardiovascular:     Rate and Rhythm: Normal rate and regular rhythm.     Heart sounds: No murmur heard.    No friction rub. No gallop.  Pulmonary:     Effort: Pulmonary effort is normal.     Breath sounds: Normal breath sounds.  Abdominal:     General: Abdomen is flat.     Palpations: Abdomen is soft.  Skin:    General: Skin is warm and dry.  Neurological:     General: No focal deficit present.     Mental Status: She is alert and oriented to person, place, and time.  Psychiatric:        Mood and Affect: Mood normal.    Lab Results Lab Results  Component Value Date   WBC 13.6 (H)  03/21/2023   HGB 10.4 (L) 03/21/2023   HCT 32.4 (L) 03/21/2023   MCV 91.0 03/21/2023   PLT 218 03/21/2023    Lab Results  Component Value Date   CREATININE 0.72 03/19/2023   BUN 27 (H) 03/19/2023   NA 136 03/19/2023   K 4.6 03/19/2023   CL 100 03/19/2023   CO2 30 03/19/2023    Lab Results  Component Value Date   ALT 10 12/28/2017   AST 17 12/28/2017   ALKPHOS 95 12/28/2017   BILITOT 0.3 12/28/2017    Lab Results  Component Value Date   CHOL 185 12/28/2017   HDL 41 12/28/2017   LDLCALC 100 (H) 12/28/2017   TRIG 218 (H) 12/28/2017   CHOLHDL 4.5 (H) 12/28/2017   No results found for: "LABRPR", "RPRTITER" No results found for: "HIV1RNAQUANT", "HIV1RNAVL", "CD4TABS"   Problem List Items Addressed This Visit   None  Results   Assessment/Plan #Right knee PJI status post poly exchange, culture negative # History of chronic diarrhea, concern for worsening with antibiotics - Patient underwent aspiration x 2 with elevated WBC count, culture negative.  Admitted with 2-day history of knee pain taken to the OR for poly exchange.  Or findings are not concerning for infection.  Cultures were negative.  Given elevated white cell count and aspiration was discharged on daptomycin x 6  weeks for PJI.  Tolerating antibiotics except for she had a brief episode of diarrhea.  Only missed 1 dose of antibiotics. Completed iv abx with dapto and rocephin x 6 weeks EOT 2/28. Transitioned to doxy + cefadroxil. Judy White had worsening watery/loose stoll with some abd pain. On probiotics Plan: Stop abx doxy and cefadroxil as OR cx ng and pt is not tolerating abx(diarrhea) Cdiff gip today F/u 1 month with labs off of abx. No concern for infection today at he knee  #Thrush w/o dysphagia 2/2 abx #Fungal rash under bra Fluconzolex 7 day for thrush Nystatin powder   Danelle Earthly, MD [facility] 05/30/2023, 10:58 AM   I have personally spent 45  minutes involved in face-to-face and non-face-to-face activities for this patient on the day of the visit. Professional time spent includes the following activities: Preparing to see the patient (review of tests), Obtaining and/or reviewing separately obtained history (admission/discharge record), Performing a medically appropriate examination and/or evaluation , Ordering medications/tests/procedures, referring and communicating with other health care professionals, Documenting clinical information in the EMR, Independently interpreting results (not separately reported), Communicating results to the patient/family/caregiver, Counseling and educating the patient/family/caregiver and Care coordination (not separately reported).

## 2023-05-31 LAB — CBC WITH DIFFERENTIAL/PLATELET
Absolute Lymphocytes: 1523 {cells}/uL (ref 850–3900)
Absolute Monocytes: 734 {cells}/uL (ref 200–950)
Basophils Absolute: 65 {cells}/uL (ref 0–200)
Basophils Relative: 0.6 %
Eosinophils Absolute: 248 {cells}/uL (ref 15–500)
Eosinophils Relative: 2.3 %
HCT: 40.7 % (ref 35.0–45.0)
Hemoglobin: 13.4 g/dL (ref 11.7–15.5)
MCH: 28.2 pg (ref 27.0–33.0)
MCHC: 32.9 g/dL (ref 32.0–36.0)
MCV: 85.5 fL (ref 80.0–100.0)
MPV: 10.8 fL (ref 7.5–12.5)
Monocytes Relative: 6.8 %
Neutro Abs: 8230 {cells}/uL — ABNORMAL HIGH (ref 1500–7800)
Neutrophils Relative %: 76.2 %
Platelets: 233 10*3/uL (ref 140–400)
RBC: 4.76 10*6/uL (ref 3.80–5.10)
RDW: 13.5 % (ref 11.0–15.0)
Total Lymphocyte: 14.1 %
WBC: 10.8 10*3/uL (ref 3.8–10.8)

## 2023-05-31 LAB — COMPLETE METABOLIC PANEL WITHOUT GFR
AG Ratio: 1.8 (calc) (ref 1.0–2.5)
ALT: 10 U/L (ref 6–29)
AST: 13 U/L (ref 10–35)
Albumin: 4.1 g/dL (ref 3.6–5.1)
Alkaline phosphatase (APISO): 77 U/L (ref 37–153)
BUN: 21 mg/dL (ref 7–25)
CO2: 25 mmol/L (ref 20–32)
Calcium: 9.2 mg/dL (ref 8.6–10.4)
Chloride: 103 mmol/L (ref 98–110)
Creat: 0.81 mg/dL (ref 0.60–0.95)
Globulin: 2.3 g/dL (ref 1.9–3.7)
Glucose, Bld: 99 mg/dL (ref 65–99)
Potassium: 4.5 mmol/L (ref 3.5–5.3)
Sodium: 138 mmol/L (ref 135–146)
Total Bilirubin: 0.3 mg/dL (ref 0.2–1.2)
Total Protein: 6.4 g/dL (ref 6.1–8.1)

## 2023-05-31 LAB — SEDIMENTATION RATE: Sed Rate: 6 mm/h (ref 0–30)

## 2023-05-31 LAB — C-REACTIVE PROTEIN: CRP: 3.1 mg/L (ref <8.0)

## 2023-06-01 ENCOUNTER — Other Ambulatory Visit: Payer: Self-pay

## 2023-06-01 ENCOUNTER — Other Ambulatory Visit (HOSPITAL_COMMUNITY): Payer: Self-pay

## 2023-06-04 ENCOUNTER — Other Ambulatory Visit: Payer: Self-pay

## 2023-06-04 ENCOUNTER — Other Ambulatory Visit

## 2023-06-04 DIAGNOSIS — R197 Diarrhea, unspecified: Secondary | ICD-10-CM

## 2023-06-07 LAB — GASTROINTESTINAL PATHOGEN PNL

## 2023-06-07 LAB — CLOSTRIDIUM DIFFICILE TOXIN B, QUALITATIVE, REAL-TIME PCR: Toxigenic C. Difficile by PCR: NOT DETECTED

## 2023-06-09 ENCOUNTER — Encounter: Payer: Self-pay | Admitting: Internal Medicine

## 2023-06-11 NOTE — Telephone Encounter (Signed)
 Stable labs. C diff negative

## 2023-06-16 ENCOUNTER — Other Ambulatory Visit (HOSPITAL_COMMUNITY): Payer: Self-pay

## 2023-06-20 DIAGNOSIS — J449 Chronic obstructive pulmonary disease, unspecified: Secondary | ICD-10-CM | POA: Diagnosis not present

## 2023-06-20 DIAGNOSIS — G4733 Obstructive sleep apnea (adult) (pediatric): Secondary | ICD-10-CM | POA: Diagnosis not present

## 2023-06-20 DIAGNOSIS — J9611 Chronic respiratory failure with hypoxia: Secondary | ICD-10-CM | POA: Diagnosis not present

## 2023-06-21 DIAGNOSIS — K529 Noninfective gastroenteritis and colitis, unspecified: Secondary | ICD-10-CM | POA: Diagnosis not present

## 2023-06-21 DIAGNOSIS — Z8619 Personal history of other infectious and parasitic diseases: Secondary | ICD-10-CM | POA: Diagnosis not present

## 2023-06-22 DIAGNOSIS — G4733 Obstructive sleep apnea (adult) (pediatric): Secondary | ICD-10-CM | POA: Diagnosis not present

## 2023-06-25 ENCOUNTER — Other Ambulatory Visit: Payer: Self-pay

## 2023-06-25 ENCOUNTER — Ambulatory Visit: Admitting: Internal Medicine

## 2023-06-25 VITALS — BP 133/66 | HR 56 | Temp 97.5°F | Wt 188.8 lb

## 2023-06-25 DIAGNOSIS — T8450XS Infection and inflammatory reaction due to unspecified internal joint prosthesis, sequela: Secondary | ICD-10-CM | POA: Diagnosis not present

## 2023-06-25 DIAGNOSIS — T8453XS Infection and inflammatory reaction due to internal right knee prosthesis, sequela: Secondary | ICD-10-CM | POA: Diagnosis not present

## 2023-06-25 NOTE — Progress Notes (Unsigned)
 Patient Active Problem List   Diagnosis Date Noted   Infection of prosthetic right knee joint (HCC) 04/27/2023   PICC (peripherally inserted central catheter) in place 04/27/2023   Synovitis of right knee, questionable infection 03/15/2023   Sleep apnea 06/12/2022   Chronic respiratory failure with hypoxia (HCC) 07/14/2021   Dyspnea on exertion 05/24/2021   Emesis, persistent 05/05/2019   Status post total right knee replacement 12/27/2018   Unilateral primary osteoarthritis, right knee 11/27/2018   Rib fracture 04/22/2018   Pneumothorax on left 01/24/2016   Abnormal CT of the chest    Pulmonary embolism (HCC)    Interstitial lung disease (HCC) 10/08/2015   Paroxysmal atrial fibrillation (HCC)    Typical atrial flutter (HCC)    Chronic diastolic heart failure (HCC) 08/25/2014   Lumbar stenosis 08/11/2014   On amiodarone  therapy 05/21/2013   Hypercholesteremia    Essential hypertension    Myalgia and myositis, unspecified    COPD    Overactive bladder     Patient's Medications  New Prescriptions   No medications on file  Previous Medications   ACETAMINOPHEN  (TYLENOL ) 500 MG TABLET    Take 500-1,000 mg by mouth every 6 (six) hours as needed for moderate pain (pain score 4-6) or headache.   ALBUTEROL  (VENTOLIN  HFA) 108 (90 BASE) MCG/ACT INHALER    Inhale 2 puffs into the lungs every 4 (four) hours as needed for wheezing or shortness of breath.   BUDESON-GLYCOPYRROL-FORMOTEROL  (BREZTRI  AEROSPHERE) 160-9-4.8 MCG/ACT AERO    Inhale 2 puffs into the lungs 2 (two) times daily.   CALCIUM -VITAMIN D  (CALTRATE 600 PLUS-VIT D PO)    Take 1 tablet by mouth daily.    CHOLECALCIFEROL  (VITAMIN D3) 2000 UNITS TABS    Take 2,000 Units by mouth daily.   CLONAZEPAM  (KLONOPIN ) 1 MG TABLET    Take 1.5 tablets (1.5 mg total) by mouth in the morning. May also take 0.5 tablets (0.5 mg total) daily as needed.   CLONAZEPAM  (KLONOPIN ) 1 MG TABLET    Take 1 tablet (1 mg total) by mouth 2  (two) times daily.   CLONAZEPAM  (KLONOPIN ) 1 MG TABLET    Take 1 tablet (1 mg total) by mouth 2 (two) times daily.   EVOLOCUMAB  (REPATHA ) 140 MG/ML SOSY    Inject 140 mg into the skin every 14 (fourteen) days.   FLUOXETINE  (PROZAC ) 20 MG CAPSULE    Take 1 capsule (20 mg total) by mouth daily with breakfast.   FLUOXETINE  (PROZAC ) 20 MG CAPSULE    Take 1 capsule (20 mg total) by mouth in the morning with food   FUROSEMIDE  (LASIX ) 20 MG TABLET    Take 1 tablet (20 mg total) by mouth daily.   HYDROCODONE -ACETAMINOPHEN  (NORCO/VICODIN) 5-325 MG TABLET    Take 1 tablet by mouth 3 (three) times daily as needed for moderate pain (pain score 4-6).   METOPROLOL  SUCCINATE (TOPROL -XL) 25 MG 24 HR TABLET    Take 0.5 tablets (12.5 mg total) by mouth daily.   MULTIPLE VITAMINS-MINERALS (PRESERVISION AREDS PO)    Take 1 each by mouth in the morning and at bedtime.   NYSTATIN  (MYCOSTATIN /NYSTOP ) POWDER    Apply 1 Application topically 3 (three) times daily.   OXYBUTYNIN  (DITROPAN -XL) 10 MG 24 HR TABLET    Take 1 tablet (10 mg total) by mouth daily.   POTASSIUM 99 MG TABS    Take 99 mg by mouth daily.   VITAMIN C  (ASCORBIC ACID )  500 MG TABLET    Take 500 mg by mouth daily.   VITAMIN E  180 MG (400 UNITS) CAPSULE    Take 400 Units daily by mouth.  Modified Medications   No medications on file  Discontinued Medications   No medications on file    Subjective: Judy White is a 83 y.o. female with history of right knee arthroplasty presents for hospital follow-up of right knee PJI.  Patient underwent right knee arthroplasty in 2020 which was complicated by some pain in 2022, MRI was not concerning for infection.  Patient started having right knee pain about 2 days prior to admission.  She underwent aspiration x 2 with 7K WBC, 92% neutrophils, cultures negative.  On arrival she had a peripheral WBC count 28K.  Taken to the OR with orthopedics Dr. Lucienne Ryder on 1/17 for right knee debridement and poly exchange.  Or cultures  did not note signs of chronic synovial infection.  Cultures remain negative.  Discharged on empiric PJI coverage with daptomycin  and Rocephin  x 6 weeks EOT 2/28.  Since then she has been having some diarrhea.  She states she has chronic diarrhea.  Her bowel movements are becoming more solid.  She is taking Imodium.  She states that she would not like to take probiotic despite counseling.  No fevers or chills.   05/30/23: pt continues have diarrhea on doxy + cefadroxil .  Today 06/25/23:  Review of Systems: Review of Systems  All other systems reviewed and are negative.   Past Medical History:  Diagnosis Date   Agoraphobia    Anxiety    Arthritis    "normal for the age; nothing gives me problems" (01/24/2016)   COPD (chronic obstructive pulmonary disease) (HCC)    "mild" (01/24/2016)   Depressive disorder, not elsewhere classified    DVT (deep venous thrombosis) (HCC) 10/08/2015   "they said the blood clot went from one of my legs to my lung"   Dysrhythmia    History of hiatal hernia    "small one" (01/24/2016)   Hypercholesteremia    Hypertension    pt denies this hx on 01/24/2016   Myalgia and myositis, unspecified    Osteopenia    Overactive bladder    Persistent atrial fibrillation (HCC)    a. s/p Watchman implant 12/2014   Presence of Watchman left atrial appendage closure device    Pulmonary embolism (HCC) 10/08/2015   Sleep apnea    USES CPAP     Social History   Tobacco Use   Smoking status: Former    Current packs/day: 0.00    Average packs/day: 0.5 packs/day for 60.0 years (30.0 ttl pk-yrs)    Types: Cigarettes    Start date: 10/08/1955    Quit date: 10/08/2015    Years since quitting: 7.7   Smokeless tobacco: Never  Vaping Use   Vaping status: Never Used  Substance Use Topics   Alcohol use: Yes    Alcohol/week: 0.0 standard drinks of alcohol    Comment: occ   Drug use: No    Family History  Problem Relation Age of Onset   Aneurysm Father    Hypotension  Mother    Neuropathy Brother    Heart disease Brother    Cancer Other        maternal side   Heart Problems Other        paternal side   Heart attack Neg Hx     Allergies  Allergen Reactions   Bupropion  Other reaction(s): anxiety   Rosuvastatin      Myalgias on 5mg  every other day   Statins     Health Maintenance  Topic Date Due   DEXA SCAN  Never done   DTaP/Tdap/Td (3 - Tdap) 05/16/2019   COVID-19 Vaccine (5 - 2024-25 season) 10/29/2022   INFLUENZA VACCINE  09/28/2023   Medicare Annual Wellness (AWV)  11/10/2023   Pneumonia Vaccine 81+ Years old  Completed   Zoster Vaccines- Shingrix  Completed   HPV VACCINES  Aged Out   Meningococcal B Vaccine  Aged Out    Objective:  Vitals:   06/25/23 1339  BP: 133/66  Pulse: (!) 56  Temp: (!) 97.5 F (36.4 C)  TempSrc: Temporal  SpO2: 94%  Weight: 188 lb 12.8 oz (85.6 kg)   Body mass index is 31.42 kg/m.  Physical Exam Constitutional:      Appearance: Normal appearance.  HENT:     Head: Normocephalic and atraumatic.     Right Ear: Tympanic membrane normal.     Left Ear: Tympanic membrane normal.     Nose: Nose normal.     Mouth/Throat:     Mouth: Mucous membranes are moist.  Eyes:     Extraocular Movements: Extraocular movements intact.     Conjunctiva/sclera: Conjunctivae normal.     Pupils: Pupils are equal, round, and reactive to light.  Cardiovascular:     Rate and Rhythm: Normal rate and regular rhythm.     Heart sounds: No murmur heard.    No friction rub. No gallop.  Pulmonary:     Effort: Pulmonary effort is normal.     Breath sounds: Normal breath sounds.  Abdominal:     General: Abdomen is flat.     Palpations: Abdomen is soft.  Musculoskeletal:        General: Normal range of motion.  Skin:    General: Skin is warm and dry.  Neurological:     General: No focal deficit present.     Mental Status: She is alert and oriented to person, place, and time.  Psychiatric:        Mood and Affect:  Mood normal.    Physical Exam   Lab Results Lab Results  Component Value Date   WBC 10.8 05/30/2023   HGB 13.4 05/30/2023   HCT 40.7 05/30/2023   MCV 85.5 05/30/2023   PLT 233 05/30/2023    Lab Results  Component Value Date   CREATININE 0.81 05/30/2023   BUN 21 05/30/2023   NA 138 05/30/2023   K 4.5 05/30/2023   CL 103 05/30/2023   CO2 25 05/30/2023    Lab Results  Component Value Date   ALT 10 05/30/2023   AST 13 05/30/2023   ALKPHOS 95 12/28/2017   BILITOT 0.3 05/30/2023    Lab Results  Component Value Date   CHOL 185 12/28/2017   HDL 41 12/28/2017   LDLCALC 100 (H) 12/28/2017   TRIG 218 (H) 12/28/2017   CHOLHDL 4.5 (H) 12/28/2017   No results found for: "LABRPR", "RPRTITER" No results found for: "HIV1RNAQUANT", "HIV1RNAVL", "CD4TABS"   Problem List Items Addressed This Visit   None  Results   Assessment/Plan #Right knee PJI status post poly exchange, culture negative # History of chronic diarrhea, concern for worsening with antibiotics - Patient underwent aspiration x 2 with elevated WBC count, culture negative.  Admitted with 2-day history of knee pain taken to the OR for poly exchange.  Or findings are not concerning for  infection.  Cultures were negative.  Given elevated white cell count and aspiration was discharged on daptomycin  x 6 weeks for PJI.  Tolerating antibiotics except for she had a brief episode of diarrhea.  Only missed 1 dose of antibiotics. Completed iv abx with dapto and rocephin  x 6 weeks EOT 2/28. Transitioned to doxy + cefadroxil . Judy White had worsening watery/loose stoll with some abd pain. On probiotics Plan: -doing weel off of abx. Sees orth in 3 months -labs today. -diarrheaimprove don immodium, di dnot atke any today -f.u prn -   Orlie Bjornstad, MD [facility] 06/25/2023, 1:43 PM

## 2023-06-26 LAB — SEDIMENTATION RATE: Sed Rate: 6 mm/h (ref 0–30)

## 2023-06-26 LAB — C-REACTIVE PROTEIN: CRP: 3.6 mg/L

## 2023-07-11 DIAGNOSIS — K529 Noninfective gastroenteritis and colitis, unspecified: Secondary | ICD-10-CM | POA: Diagnosis not present

## 2023-07-11 DIAGNOSIS — I480 Paroxysmal atrial fibrillation: Secondary | ICD-10-CM | POA: Diagnosis not present

## 2023-07-14 ENCOUNTER — Other Ambulatory Visit: Payer: Self-pay

## 2023-07-16 ENCOUNTER — Other Ambulatory Visit (HOSPITAL_COMMUNITY): Payer: Self-pay

## 2023-07-16 ENCOUNTER — Other Ambulatory Visit: Payer: Self-pay

## 2023-07-17 ENCOUNTER — Other Ambulatory Visit: Payer: Self-pay

## 2023-07-20 DIAGNOSIS — G4733 Obstructive sleep apnea (adult) (pediatric): Secondary | ICD-10-CM | POA: Diagnosis not present

## 2023-07-20 DIAGNOSIS — J449 Chronic obstructive pulmonary disease, unspecified: Secondary | ICD-10-CM | POA: Diagnosis not present

## 2023-07-21 ENCOUNTER — Other Ambulatory Visit (HOSPITAL_COMMUNITY): Payer: Self-pay

## 2023-07-22 DIAGNOSIS — G4733 Obstructive sleep apnea (adult) (pediatric): Secondary | ICD-10-CM | POA: Diagnosis not present

## 2023-07-23 ENCOUNTER — Other Ambulatory Visit (HOSPITAL_COMMUNITY): Payer: Self-pay

## 2023-07-23 MED ORDER — METOPROLOL SUCCINATE ER 25 MG PO TB24
12.5000 mg | ORAL_TABLET | Freq: Every day | ORAL | 3 refills | Status: AC
  Filled 2023-07-23: qty 45, 90d supply, fill #0
  Filled 2023-09-10 – 2023-10-21 (×2): qty 45, 90d supply, fill #1
  Filled 2024-01-16: qty 45, 90d supply, fill #2

## 2023-07-24 ENCOUNTER — Other Ambulatory Visit: Payer: Self-pay

## 2023-07-24 ENCOUNTER — Other Ambulatory Visit (HOSPITAL_COMMUNITY): Payer: Self-pay

## 2023-07-25 ENCOUNTER — Other Ambulatory Visit: Payer: Self-pay

## 2023-07-28 DIAGNOSIS — I509 Heart failure, unspecified: Secondary | ICD-10-CM | POA: Diagnosis not present

## 2023-07-28 DIAGNOSIS — I10 Essential (primary) hypertension: Secondary | ICD-10-CM | POA: Diagnosis not present

## 2023-07-28 DIAGNOSIS — E782 Mixed hyperlipidemia: Secondary | ICD-10-CM | POA: Diagnosis not present

## 2023-07-28 DIAGNOSIS — J449 Chronic obstructive pulmonary disease, unspecified: Secondary | ICD-10-CM | POA: Diagnosis not present

## 2023-08-14 ENCOUNTER — Other Ambulatory Visit: Payer: Self-pay

## 2023-08-14 ENCOUNTER — Other Ambulatory Visit (HOSPITAL_COMMUNITY): Payer: Self-pay

## 2023-08-14 MED ORDER — BREZTRI AEROSPHERE 160-9-4.8 MCG/ACT IN AERO
2.0000 | INHALATION_SPRAY | Freq: Two times a day (BID) | RESPIRATORY_TRACT | 3 refills | Status: DC
  Filled 2023-08-14: qty 10.7, 30d supply, fill #0
  Filled 2023-09-10: qty 10.7, 30d supply, fill #1
  Filled 2023-10-10: qty 10.7, 30d supply, fill #2
  Filled 2023-11-27: qty 10.7, 30d supply, fill #3

## 2023-08-20 DIAGNOSIS — R239 Unspecified skin changes: Secondary | ICD-10-CM | POA: Diagnosis not present

## 2023-08-22 DIAGNOSIS — G4733 Obstructive sleep apnea (adult) (pediatric): Secondary | ICD-10-CM | POA: Diagnosis not present

## 2023-08-27 ENCOUNTER — Ambulatory Visit: Admitting: Orthopaedic Surgery

## 2023-08-27 ENCOUNTER — Other Ambulatory Visit (INDEPENDENT_AMBULATORY_CARE_PROVIDER_SITE_OTHER): Payer: Self-pay

## 2023-08-27 DIAGNOSIS — M5441 Lumbago with sciatica, right side: Secondary | ICD-10-CM | POA: Diagnosis not present

## 2023-08-27 DIAGNOSIS — J449 Chronic obstructive pulmonary disease, unspecified: Secondary | ICD-10-CM | POA: Diagnosis not present

## 2023-08-27 DIAGNOSIS — I10 Essential (primary) hypertension: Secondary | ICD-10-CM | POA: Diagnosis not present

## 2023-08-27 DIAGNOSIS — I509 Heart failure, unspecified: Secondary | ICD-10-CM | POA: Diagnosis not present

## 2023-08-27 DIAGNOSIS — E782 Mixed hyperlipidemia: Secondary | ICD-10-CM | POA: Diagnosis not present

## 2023-08-27 DIAGNOSIS — G8929 Other chronic pain: Secondary | ICD-10-CM | POA: Diagnosis not present

## 2023-08-27 DIAGNOSIS — Z8739 Personal history of other diseases of the musculoskeletal system and connective tissue: Secondary | ICD-10-CM

## 2023-08-27 NOTE — Progress Notes (Signed)
 The patient is a 83 year old female who is almost 6 months out from an irrigation debridement of her right knee due to an acute infection.  This was a total knee arthroplasty and fortunately we performed a synovectomy and polyliner exchange.  She has done well since then and denies any knee swelling.  She does report right-sided sciatica.  She does have a remote history in 2016 of a posterior lumbar interbody fusion between L4 and L5 by Dr. Colon.  She has recently developed some sciatica on the right side.  She does ambulate using a cane.  Examination of her right knee shows no effusion today.  The incision is well-healed.  There is no redness and good range of motion of her knee.  Her right hip moves smoothly and fluidly with no blocks or rotation.  She does have some right-sided low back pain and just a little bit of sciatica but not a true positive straight leg raise.  NuPrep 2 views of the lumbar spine show an instrumented fusion between L4 and L5 with the interbody fusion.  There is no complicating feature of the hardware.  The patient has developed degenerative changes between L5 and S1.  From a knee standpoint follow-up can be as needed since she is doing so well.  She is off of all antibiotics.  If she does have any issues with the right knee she knows to reach out to us  again.  She said she will reach out to Dr. Thayer office about her back.

## 2023-08-28 ENCOUNTER — Other Ambulatory Visit (HOSPITAL_COMMUNITY): Payer: Self-pay

## 2023-08-28 DIAGNOSIS — F3341 Major depressive disorder, recurrent, in partial remission: Secondary | ICD-10-CM | POA: Diagnosis not present

## 2023-08-28 MED ORDER — CLONAZEPAM 1 MG PO TABS
1.0000 mg | ORAL_TABLET | Freq: Two times a day (BID) | ORAL | 5 refills | Status: AC
  Filled 2023-09-10 – 2023-09-23 (×3): qty 60, 30d supply, fill #0
  Filled 2023-11-03: qty 60, 30d supply, fill #1
  Filled 2023-12-16 – 2023-12-17 (×2): qty 60, 30d supply, fill #2

## 2023-08-28 MED ORDER — FLUOXETINE HCL 20 MG PO CAPS
20.0000 mg | ORAL_CAPSULE | ORAL | 2 refills | Status: AC
  Filled 2023-11-29: qty 90, 90d supply, fill #0
  Filled 2024-02-25: qty 90, 90d supply, fill #1

## 2023-09-10 ENCOUNTER — Other Ambulatory Visit: Payer: Self-pay | Admitting: Internal Medicine

## 2023-09-10 ENCOUNTER — Other Ambulatory Visit (HOSPITAL_COMMUNITY): Payer: Self-pay

## 2023-09-10 ENCOUNTER — Other Ambulatory Visit: Payer: Self-pay

## 2023-09-12 ENCOUNTER — Other Ambulatory Visit: Payer: Self-pay

## 2023-09-12 MED ORDER — FUROSEMIDE 20 MG PO TABS
20.0000 mg | ORAL_TABLET | Freq: Every day | ORAL | 0 refills | Status: DC
  Filled 2023-09-12: qty 90, 90d supply, fill #0

## 2023-09-21 DIAGNOSIS — G4733 Obstructive sleep apnea (adult) (pediatric): Secondary | ICD-10-CM | POA: Diagnosis not present

## 2023-09-24 ENCOUNTER — Other Ambulatory Visit: Payer: Self-pay

## 2023-09-25 ENCOUNTER — Other Ambulatory Visit (HOSPITAL_COMMUNITY): Payer: Self-pay

## 2023-09-27 DIAGNOSIS — E782 Mixed hyperlipidemia: Secondary | ICD-10-CM | POA: Diagnosis not present

## 2023-09-27 DIAGNOSIS — I10 Essential (primary) hypertension: Secondary | ICD-10-CM | POA: Diagnosis not present

## 2023-09-27 DIAGNOSIS — J449 Chronic obstructive pulmonary disease, unspecified: Secondary | ICD-10-CM | POA: Diagnosis not present

## 2023-09-27 DIAGNOSIS — I509 Heart failure, unspecified: Secondary | ICD-10-CM | POA: Diagnosis not present

## 2023-10-08 DIAGNOSIS — G4733 Obstructive sleep apnea (adult) (pediatric): Secondary | ICD-10-CM | POA: Diagnosis not present

## 2023-10-08 DIAGNOSIS — J449 Chronic obstructive pulmonary disease, unspecified: Secondary | ICD-10-CM | POA: Diagnosis not present

## 2023-10-09 ENCOUNTER — Other Ambulatory Visit (HOSPITAL_COMMUNITY): Payer: Self-pay

## 2023-10-09 ENCOUNTER — Other Ambulatory Visit: Payer: Self-pay

## 2023-10-09 MED ORDER — REPATHA 140 MG/ML ~~LOC~~ SOSY
140.0000 mg | PREFILLED_SYRINGE | SUBCUTANEOUS | 1 refills | Status: DC
  Filled 2023-10-09: qty 2, 28d supply, fill #0
  Filled 2023-11-02: qty 2, 28d supply, fill #1

## 2023-10-10 ENCOUNTER — Other Ambulatory Visit (HOSPITAL_COMMUNITY): Payer: Self-pay

## 2023-10-19 ENCOUNTER — Other Ambulatory Visit (HOSPITAL_COMMUNITY): Payer: Self-pay

## 2023-10-22 ENCOUNTER — Other Ambulatory Visit (HOSPITAL_COMMUNITY): Payer: Self-pay

## 2023-10-22 ENCOUNTER — Other Ambulatory Visit: Payer: Self-pay

## 2023-10-22 DIAGNOSIS — G4733 Obstructive sleep apnea (adult) (pediatric): Secondary | ICD-10-CM | POA: Diagnosis not present

## 2023-10-25 ENCOUNTER — Other Ambulatory Visit (HOSPITAL_COMMUNITY): Payer: Self-pay

## 2023-10-28 DIAGNOSIS — J449 Chronic obstructive pulmonary disease, unspecified: Secondary | ICD-10-CM | POA: Diagnosis not present

## 2023-10-28 DIAGNOSIS — I509 Heart failure, unspecified: Secondary | ICD-10-CM | POA: Diagnosis not present

## 2023-10-28 DIAGNOSIS — I10 Essential (primary) hypertension: Secondary | ICD-10-CM | POA: Diagnosis not present

## 2023-10-28 DIAGNOSIS — E782 Mixed hyperlipidemia: Secondary | ICD-10-CM | POA: Diagnosis not present

## 2023-11-02 ENCOUNTER — Other Ambulatory Visit (HOSPITAL_COMMUNITY): Payer: Self-pay

## 2023-11-02 ENCOUNTER — Other Ambulatory Visit: Payer: Self-pay

## 2023-11-02 DIAGNOSIS — Z6831 Body mass index (BMI) 31.0-31.9, adult: Secondary | ICD-10-CM | POA: Diagnosis not present

## 2023-11-02 DIAGNOSIS — M4316 Spondylolisthesis, lumbar region: Secondary | ICD-10-CM | POA: Diagnosis not present

## 2023-11-03 ENCOUNTER — Other Ambulatory Visit (HOSPITAL_COMMUNITY): Payer: Self-pay

## 2023-11-05 ENCOUNTER — Other Ambulatory Visit (HOSPITAL_COMMUNITY): Payer: Self-pay

## 2023-11-05 ENCOUNTER — Other Ambulatory Visit: Payer: Self-pay

## 2023-11-06 ENCOUNTER — Other Ambulatory Visit (HOSPITAL_COMMUNITY): Payer: Self-pay

## 2023-11-07 ENCOUNTER — Other Ambulatory Visit (HOSPITAL_COMMUNITY): Payer: Self-pay

## 2023-11-07 ENCOUNTER — Other Ambulatory Visit: Payer: Self-pay

## 2023-11-07 DIAGNOSIS — I480 Paroxysmal atrial fibrillation: Secondary | ICD-10-CM | POA: Diagnosis not present

## 2023-11-07 DIAGNOSIS — G4733 Obstructive sleep apnea (adult) (pediatric): Secondary | ICD-10-CM | POA: Diagnosis not present

## 2023-11-07 DIAGNOSIS — G4734 Idiopathic sleep related nonobstructive alveolar hypoventilation: Secondary | ICD-10-CM | POA: Diagnosis not present

## 2023-11-07 DIAGNOSIS — I5032 Chronic diastolic (congestive) heart failure: Secondary | ICD-10-CM | POA: Diagnosis not present

## 2023-11-07 DIAGNOSIS — J8410 Pulmonary fibrosis, unspecified: Secondary | ICD-10-CM | POA: Diagnosis not present

## 2023-11-07 DIAGNOSIS — Z9981 Dependence on supplemental oxygen: Secondary | ICD-10-CM | POA: Diagnosis not present

## 2023-11-07 DIAGNOSIS — J9611 Chronic respiratory failure with hypoxia: Secondary | ICD-10-CM | POA: Diagnosis not present

## 2023-11-07 MED ORDER — OXYBUTYNIN CHLORIDE ER 10 MG PO TB24
10.0000 mg | ORAL_TABLET | Freq: Every day | ORAL | 3 refills | Status: AC
  Filled 2023-11-07: qty 90, 90d supply, fill #1
  Filled 2023-11-07: qty 90, 90d supply, fill #0
  Filled 2023-11-29 – 2024-01-31 (×2): qty 90, 90d supply, fill #1

## 2023-11-14 DIAGNOSIS — I480 Paroxysmal atrial fibrillation: Secondary | ICD-10-CM | POA: Diagnosis not present

## 2023-11-14 DIAGNOSIS — Z23 Encounter for immunization: Secondary | ICD-10-CM | POA: Diagnosis not present

## 2023-11-14 DIAGNOSIS — M8589 Other specified disorders of bone density and structure, multiple sites: Secondary | ICD-10-CM | POA: Diagnosis not present

## 2023-11-14 DIAGNOSIS — M48061 Spinal stenosis, lumbar region without neurogenic claudication: Secondary | ICD-10-CM | POA: Diagnosis not present

## 2023-11-14 DIAGNOSIS — M419 Scoliosis, unspecified: Secondary | ICD-10-CM | POA: Diagnosis not present

## 2023-11-14 DIAGNOSIS — E782 Mixed hyperlipidemia: Secondary | ICD-10-CM | POA: Diagnosis not present

## 2023-11-14 DIAGNOSIS — M5126 Other intervertebral disc displacement, lumbar region: Secondary | ICD-10-CM | POA: Diagnosis not present

## 2023-11-14 DIAGNOSIS — M4316 Spondylolisthesis, lumbar region: Secondary | ICD-10-CM | POA: Diagnosis not present

## 2023-11-14 DIAGNOSIS — J449 Chronic obstructive pulmonary disease, unspecified: Secondary | ICD-10-CM | POA: Diagnosis not present

## 2023-11-14 DIAGNOSIS — Z981 Arthrodesis status: Secondary | ICD-10-CM | POA: Diagnosis not present

## 2023-11-14 DIAGNOSIS — J9611 Chronic respiratory failure with hypoxia: Secondary | ICD-10-CM | POA: Diagnosis not present

## 2023-11-14 DIAGNOSIS — I10 Essential (primary) hypertension: Secondary | ICD-10-CM | POA: Diagnosis not present

## 2023-11-14 DIAGNOSIS — Z1331 Encounter for screening for depression: Secondary | ICD-10-CM | POA: Diagnosis not present

## 2023-11-14 DIAGNOSIS — Z0000 Encounter for general adult medical examination without abnormal findings: Secondary | ICD-10-CM | POA: Diagnosis not present

## 2023-11-14 DIAGNOSIS — Z79899 Other long term (current) drug therapy: Secondary | ICD-10-CM | POA: Diagnosis not present

## 2023-11-21 DIAGNOSIS — H353121 Nonexudative age-related macular degeneration, left eye, early dry stage: Secondary | ICD-10-CM | POA: Diagnosis not present

## 2023-11-21 DIAGNOSIS — H52203 Unspecified astigmatism, bilateral: Secondary | ICD-10-CM | POA: Diagnosis not present

## 2023-11-21 DIAGNOSIS — Z961 Presence of intraocular lens: Secondary | ICD-10-CM | POA: Diagnosis not present

## 2023-11-21 DIAGNOSIS — H04222 Epiphora due to insufficient drainage, left lacrimal gland: Secondary | ICD-10-CM | POA: Diagnosis not present

## 2023-11-21 DIAGNOSIS — H04562 Stenosis of left lacrimal punctum: Secondary | ICD-10-CM | POA: Diagnosis not present

## 2023-11-22 DIAGNOSIS — G4733 Obstructive sleep apnea (adult) (pediatric): Secondary | ICD-10-CM | POA: Diagnosis not present

## 2023-11-25 ENCOUNTER — Other Ambulatory Visit (HOSPITAL_COMMUNITY): Payer: Self-pay | Admitting: Psychiatry

## 2023-11-27 ENCOUNTER — Other Ambulatory Visit: Payer: Self-pay

## 2023-11-27 ENCOUNTER — Other Ambulatory Visit (HOSPITAL_COMMUNITY): Payer: Self-pay

## 2023-11-27 DIAGNOSIS — E782 Mixed hyperlipidemia: Secondary | ICD-10-CM | POA: Diagnosis not present

## 2023-11-27 DIAGNOSIS — J449 Chronic obstructive pulmonary disease, unspecified: Secondary | ICD-10-CM | POA: Diagnosis not present

## 2023-11-27 DIAGNOSIS — I509 Heart failure, unspecified: Secondary | ICD-10-CM | POA: Diagnosis not present

## 2023-11-27 DIAGNOSIS — I10 Essential (primary) hypertension: Secondary | ICD-10-CM | POA: Diagnosis not present

## 2023-11-29 ENCOUNTER — Other Ambulatory Visit (HOSPITAL_COMMUNITY): Payer: Self-pay

## 2023-11-29 ENCOUNTER — Other Ambulatory Visit: Payer: Self-pay

## 2023-11-30 DIAGNOSIS — Z6831 Body mass index (BMI) 31.0-31.9, adult: Secondary | ICD-10-CM | POA: Diagnosis not present

## 2023-11-30 DIAGNOSIS — M48062 Spinal stenosis, lumbar region with neurogenic claudication: Secondary | ICD-10-CM | POA: Diagnosis not present

## 2023-12-03 ENCOUNTER — Other Ambulatory Visit (HOSPITAL_COMMUNITY): Payer: Self-pay

## 2023-12-04 ENCOUNTER — Other Ambulatory Visit: Payer: Self-pay | Admitting: Internal Medicine

## 2023-12-04 ENCOUNTER — Encounter (HOSPITAL_COMMUNITY): Payer: Self-pay

## 2023-12-04 ENCOUNTER — Other Ambulatory Visit (HOSPITAL_COMMUNITY): Payer: Self-pay

## 2023-12-04 MED ORDER — REPATHA 140 MG/ML ~~LOC~~ SOSY
140.0000 mg | PREFILLED_SYRINGE | SUBCUTANEOUS | 1 refills | Status: AC
  Filled 2023-12-04: qty 2, 28d supply, fill #0
  Filled 2023-12-28: qty 2, 28d supply, fill #1

## 2023-12-06 ENCOUNTER — Other Ambulatory Visit: Payer: Self-pay

## 2023-12-06 ENCOUNTER — Other Ambulatory Visit (HOSPITAL_COMMUNITY): Payer: Self-pay

## 2023-12-06 MED ORDER — FUROSEMIDE 20 MG PO TABS
20.0000 mg | ORAL_TABLET | Freq: Every day | ORAL | 0 refills | Status: DC
  Filled 2023-12-06: qty 90, 90d supply, fill #0

## 2023-12-10 ENCOUNTER — Telehealth (HOSPITAL_BASED_OUTPATIENT_CLINIC_OR_DEPARTMENT_OTHER): Payer: Self-pay | Admitting: *Deleted

## 2023-12-10 NOTE — Telephone Encounter (Signed)
   Pre-operative Risk Assessment    Patient Name: Judy White  DOB: 06/12/1940 MRN: [id number]   Date of last office visit: 12/12/22 DR. PAULA ROSS Date of next office visit: 12/21/23 DR. PAULA ROSS   Request for Surgical Clearance    Procedure:  SUBLAMINAR DECOMPRESSION L3-L4  Date of Surgery:  Clearance TBD                                Surgeon:  DR. VICTORY GENS Surgeon's Group or Practice Name:  [facility] Phone number:  [phone] Fax number:  [phone] ATTN: KATIE   Type of Clearance Requested:   - Medical ; NONE INDICATED PER FORM TO BE HELD   Type of Anesthesia:  Not Indicated (GENERAL ?)   Additional requests/questions:    [name]   12/10/2023, 1:41 PM

## 2023-12-11 NOTE — Telephone Encounter (Signed)
 Pt has appt in office with Dr. Okey 12/21/23. I will update all parties involved.

## 2023-12-11 NOTE — Telephone Encounter (Signed)
   Name: Judy White  DOB: Oct 18, 1940  MRN: [id number]  Primary Cardiologist: Victory LELON Claudene DOUGLAS, MD (Inactive)  Chart reviewed as part of pre-operative protocol coverage. Because of Judy White past medical history and time since last visit, she will require a follow-up in-office visit in order to better assess preoperative cardiovascular risk.  Pre-op covering staff: - Please schedule appointment and call patient to inform them. If patient already had an upcoming appointment within acceptable timeframe, please add pre-op clearance to the appointment notes so provider is aware. - Please contact requesting surgeon's office via preferred method (i.e, phone, fax) to inform them of need for appointment prior to surgery.  No medications indicated as needing held.  Orren LOISE Fabry, PA-C  12/11/2023, 7:49 AM

## 2023-12-13 ENCOUNTER — Telehealth: Payer: Self-pay | Admitting: Emergency Medicine

## 2023-12-13 NOTE — Telephone Encounter (Signed)
 Fax received from Dr. Victory Gens with [facility] to perform a Sublaminar decompression L3-4  on patient.  Patient needs surgery clearance. Surgery is TBD. Patient was seen on 06/12/22. Office protocol is a risk assessment can be sent to surgeon if patient has been seen in 60 days or less.   Pt needs ov for risk assessment. I called and spoke with her and hve her scheduled with Beth for 12/18/23. Will hold in clearance pool until this visit is complete.

## 2023-12-16 ENCOUNTER — Other Ambulatory Visit (HOSPITAL_COMMUNITY): Payer: Self-pay

## 2023-12-17 ENCOUNTER — Other Ambulatory Visit (HOSPITAL_COMMUNITY): Payer: Self-pay

## 2023-12-17 ENCOUNTER — Other Ambulatory Visit: Payer: Self-pay

## 2023-12-17 DIAGNOSIS — H04562 Stenosis of left lacrimal punctum: Secondary | ICD-10-CM | POA: Diagnosis not present

## 2023-12-17 DIAGNOSIS — H04222 Epiphora due to insufficient drainage, left lacrimal gland: Secondary | ICD-10-CM | POA: Diagnosis not present

## 2023-12-17 MED ORDER — NEOMYCIN-POLYMYXIN-DEXAMETH 0.1 % OP SUSP
1.0000 [drp] | Freq: Four times a day (QID) | OPHTHALMIC | 0 refills | Status: DC
  Filled 2023-12-17: qty 5, 7d supply, fill #0

## 2023-12-18 ENCOUNTER — Ambulatory Visit: Admitting: Primary Care

## 2023-12-18 ENCOUNTER — Ambulatory Visit (INDEPENDENT_AMBULATORY_CARE_PROVIDER_SITE_OTHER)

## 2023-12-18 ENCOUNTER — Encounter: Payer: Self-pay | Admitting: Primary Care

## 2023-12-18 VITALS — BP 107/65 | HR 67 | Temp 97.8°F | Ht 66.0 in | Wt 196.4 lb

## 2023-12-18 DIAGNOSIS — Z01811 Encounter for preprocedural respiratory examination: Secondary | ICD-10-CM

## 2023-12-18 DIAGNOSIS — J449 Chronic obstructive pulmonary disease, unspecified: Secondary | ICD-10-CM | POA: Diagnosis not present

## 2023-12-18 DIAGNOSIS — J439 Emphysema, unspecified: Secondary | ICD-10-CM | POA: Diagnosis not present

## 2023-12-18 DIAGNOSIS — G4733 Obstructive sleep apnea (adult) (pediatric): Secondary | ICD-10-CM | POA: Diagnosis not present

## 2023-12-18 DIAGNOSIS — J9611 Chronic respiratory failure with hypoxia: Secondary | ICD-10-CM

## 2023-12-18 DIAGNOSIS — Z01818 Encounter for other preprocedural examination: Secondary | ICD-10-CM | POA: Diagnosis not present

## 2023-12-18 DIAGNOSIS — J849 Interstitial pulmonary disease, unspecified: Secondary | ICD-10-CM

## 2023-12-18 DIAGNOSIS — J9610 Chronic respiratory failure, unspecified whether with hypoxia or hypercapnia: Secondary | ICD-10-CM | POA: Diagnosis not present

## 2023-12-18 DIAGNOSIS — Z87891 Personal history of nicotine dependence: Secondary | ICD-10-CM | POA: Diagnosis not present

## 2023-12-18 DIAGNOSIS — G4730 Sleep apnea, unspecified: Secondary | ICD-10-CM

## 2023-12-18 NOTE — Patient Instructions (Addendum)
  VISIT SUMMARY: Matty, during your visit today, we reviewed your COPD, chronic respiratory failure, and sleep apnea. Your breathing is well controlled with your current medications and oxygen  therapy. We also discussed your upcoming back surgery and the associated risks due to your respiratory conditions. Your sleep apnea is well managed with your CPAP machine, and you are compliant with its use.  YOUR PLAN: -CHRONIC OBSTRUCTIVE PULMONARY DISEASE (COPD) AND CHRONIC RESPIRATORY FAILURE WITH HYPOXIA: COPD is a chronic lung disease that makes it hard to breathe, and chronic respiratory failure means your lungs can't get enough oxygen  into your blood. Your COPD is well-controlled with the Breztri  inhaler, and your oxygen  therapy is helping manage your chronic respiratory failure. We will continue your current medications and oxygen  therapy. We also discussed the risks associated with your upcoming surgery, including the possibility of needing prolonged mechanical ventilation, postoperative pneumonia, respiratory failure, and blood clots. To help reduce these risks, it's important to get out of bed and do light exercises after surgery. We will also order a chest x-ray and a breathing test to assess your current lung function.  -OBSTRUCTIVE SLEEP APNEA: Obstructive sleep apnea is a condition where your breathing stops and starts during sleep. Your sleep apnea is well-controlled with your CPAP machine, and you are using it consistently. We will continue with your current CPAP settings.  INSTRUCTIONS: Please follow up with the chest x-ray and breathing test as ordered. Continue using your Breztri  inhaler and oxygen  therapy as directed. After your surgery, make sure to get out of bed and do light exercises to help prevent complications. Keep using your CPAP machine every night as you have been.   Orders: CXR today Pre-op spirometry first available   Follow-up: 6 months [place]

## 2023-12-18 NOTE — Progress Notes (Addendum)
 @Patient  ID: Judy White, female    DOB: Jan 10, 1941, 83 y.o.   MRN: [id number]  Chief Complaint  Patient presents with   Medical Management of Chronic Issues    Mid - lower back surgery. [facility] hospital, not scheduled. Dr Colon at [facility].     Referring provider: Dwight Trula SQUIBB, MD  HPI: 83 year old female, former smoker. PMH significant for diastolic heart failure, afib on amiodarone  therapy,  HTN, COPD, ILD, sleep apnea, pneumothorax, infection prosthetic right knee joint.  12/18/2023 Discussed the use of AI scribe software for clinical note transcription with the patient, who gave verbal consent to proceed.  History of Present Illness Judy White is an 83 year old female with COPD, chronic respiratory failure, and sleep apnea who presents for a one-year follow-up and surgical clearance.  She has COPD, chronic respiratory failure, and sleep apnea. Her breathing is well controlled on Breztri , two puffs twice a day. Since her hospitalization in January for knee infection, her oxygen  levels have improved significantly. She uses a portable concentrator with two liters of oxygen , sometimes increasing it when in the car. She experiences occasional low oxygen  levels at night, dropping to 88 or 89, but never lower. She uses oxygen  with her CPAP at night.  No recent bronchitis symptoms, wheezing, or respiratory infections in the last three months. She received her RSV vaccine this week. No hospitalizations for breathing issues in the last three months. Her use of a rescue inhaler is occasional, depending on her activity level, and she has not refilled it since January. She rates her cough as a one, with occasional phlegm but no chest tightness. She experiences shortness of breath when walking up a hill or stairs and has help with household activities. She uses a chairlift for stairs and has not fallen recently, though she feels unsteady at times.  Her sleep apnea is well  controlled with a CPAP, showing 100% compliance with usage over four hours, averaging six hours and fifty minutes per night. Her apnea score is 1.2. She notices a difference in her well-being when not using the CPAP.  She is considering back surgery with Dr. Colon with [facility] for lumbar spondylolisthesis, back pain affects her ability to get going in the mornings. She takes Tylenol  three times a day for pain management.  CPAP Compliance 11/17/23-12/16/23 Usage days 30/30 days (100%) Usage 6 hours 50 mins Pressure 10cm h20 Airleaks 2.7L/min AHI 1.2   Allergies  Allergen Reactions   Bupropion     Other reaction(s): anxiety   Rosuvastatin      Myalgias on 5mg  every other day   Statins     Immunization History  Administered Date(s) Administered   Fluad Quad(high Dose 65+) 01/04/2022   Fluzone Influenza virus vaccine,trivalent (IIV3), split virus 12/19/2011, 12/12/2012, 11/27/2013, 11/12/2015   INFLUENZA, HIGH DOSE SEASONAL PF 11/19/2015, 11/24/2016, 10/30/2018, 01/15/2020, 12/02/2020   Influenza Split 12/01/2008, 11/05/2009, 11/18/2014   Influenza,inj,Quad PF,6+ Mos 11/24/2010   Influenza-Unspecified 12/21/2017   Moderna Sars-Covid-2 Vaccination 12/14/2020, 01/05/2022   PFIZER(Purple Top)SARS-COV-2 Vaccination 03/10/2019, 03/30/2019, 12/13/2019   Pneumococcal Conjugate-13 06/23/2013   Pneumococcal Polysaccharide-23 05/28/2002, 12/03/2015   Td 05/28/2002, 05/15/2009   Zoster Recombinant(Shingrix) 07/10/2017, 09/10/2017   Zoster, Live 05/28/2007, 09/11/2017, 12/11/2017    Past Medical History:  Diagnosis Date   Agoraphobia    Anxiety    Arthritis    normal for the age; nothing gives me problems (01/24/2016)   COPD (chronic obstructive pulmonary disease) (HCC)  mild (01/24/2016)   Depressive disorder, not elsewhere classified    DVT (deep venous thrombosis) (HCC) 10/08/2015   they said the blood clot went from one of my legs to my lung   Dysrhythmia     History of hiatal hernia    small one (01/24/2016)   Hypercholesteremia    Hypertension    pt denies this hx on 01/24/2016   Myalgia and myositis, unspecified    Osteopenia    Overactive bladder    Persistent atrial fibrillation (HCC)    a. s/p Watchman implant 12/2014   Presence of Watchman left atrial appendage closure device    Pulmonary embolism (HCC) 10/08/2015   Sleep apnea    USES CPAP     Tobacco History: Social History   Tobacco Use  Smoking Status Former   Current packs/day: 0.00   Average packs/day: 0.5 packs/day for 60.0 years (30.0 ttl pk-yrs)   Types: Cigarettes   Start date: 10/08/1955   Quit date: 10/08/2015   Years since quitting: 8.2  Smokeless Tobacco Never   Counseling given: Not Answered   Outpatient Medications Prior to Visit  Medication Sig Dispense Refill   acetaminophen  (TYLENOL ) 500 MG tablet Take 500-1,000 mg by mouth every 6 (six) hours as needed for moderate pain (pain score 4-6) or headache.     albuterol  (VENTOLIN  HFA) 108 (90 Base) MCG/ACT inhaler Inhale 2 puffs into the lungs every 4 (four) hours as needed for wheezing or shortness of breath. 8 g 3   budesonide -glycopyrrolate -formoterol  (BREZTRI  AEROSPHERE) 160-9-4.8 MCG/ACT AERO inhaler Inhale 2 puffs into the lungs 2 (two) times daily. 10.7 g 3   Calcium -Vitamin D  (CALTRATE 600 PLUS-VIT D PO) Take 1 tablet by mouth daily.      Cholecalciferol  (VITAMIN D3) 2000 units TABS Take 2,000 Units by mouth daily.     clonazePAM  (KLONOPIN ) 1 MG tablet Take 1 tablet (1 mg total) by mouth 2 (two) times daily. 60 tablet 5   Evolocumab  (REPATHA ) 140 MG/ML SOSY Inject 140 mg into the skin every 14 (fourteen) days. 2 mL 1   FLUoxetine  (PROZAC ) 20 MG capsule Take 1 capsule (20 mg total) by mouth daily with breakfast. 90 capsule 2   FLUoxetine  (PROZAC ) 20 MG capsule Take 1 capsule (20 mg total) by mouth every morning with food. 90 capsule 2   furosemide  (LASIX ) 20 MG tablet Take 1 tablet (20 mg total) by  mouth daily. 90 tablet 0   metoprolol  succinate (TOPROL -XL) 25 MG 24 hr tablet Take 0.5 tablets (12.5 mg total) by mouth daily. 45 tablet 3   Multiple Vitamins-Minerals (PRESERVISION AREDS PO) Take 1 each by mouth in the morning and at bedtime.     neomycin-polymyxin-dexamethasone  (MAXITROL) 0.1 % ophthalmic suspension Place 1 drop into the left eye 4 (four) times daily. 5 mL 0   oxybutynin  (DITROPAN -XL) 10 MG 24 hr tablet Take 1 tablet (10 mg total) by mouth daily. 90 tablet 3   Potassium 99 MG TABS Take 99 mg by mouth daily.     vitamin C  (ASCORBIC ACID ) 500 MG tablet Take 500 mg by mouth daily.     vitamin E  180 MG (400 UNITS) capsule Take 400 Units daily by mouth.     clonazePAM  (KLONOPIN ) 1 MG tablet Take 1 tablet (1 mg total) by mouth 2 (two) times daily. 60 tablet 3   clonazePAM  (KLONOPIN ) 1 MG tablet Take 1.5 tablets (1.5 mg total) by mouth in the morning. May also take 0.5 tablets (0.5 mg total) daily  as needed. 60 tablet 5   clonazePAM  (KLONOPIN ) 1 MG tablet Take 1 tablet (1 mg total) by mouth 2 (two) times daily. 60 tablet 3   FLUoxetine  (PROZAC ) 20 MG capsule Take 1 capsule (20 mg total) by mouth in the morning with food 90 capsule 2   HYDROcodone -acetaminophen  (NORCO/VICODIN) 5-325 MG tablet Take 1 tablet by mouth 3 (three) times daily as needed for moderate pain (pain score 4-6). 30 tablet 0   nystatin  (MYCOSTATIN /NYSTOP ) powder Apply 1 Application topically 3 (three) times daily. 15 g 0   No facility-administered medications prior to visit.   Review of Systems  Review of Systems  Respiratory: Negative.    Musculoskeletal:  Positive for arthralgias.   Physical Exam  BP 107/65   Pulse 67   Temp 97.8 F (36.6 C)   Ht 5' 6 (1.676 m)   Wt 196 lb 6.4 oz (89.1 kg)   SpO2 94% Comment: ra  BMI 31.70 kg/m  Physical Exam Constitutional:      Appearance: Normal appearance. She is well-developed.  HENT:     Head: Normocephalic and atraumatic.     Mouth/Throat:     Mouth:  Mucous membranes are moist.     Pharynx: Oropharynx is clear.  Eyes:     Pupils: Pupils are equal, round, and reactive to light.  Cardiovascular:     Rate and Rhythm: Normal rate and regular rhythm.     Heart sounds: Normal heart sounds. No murmur heard. Pulmonary:     Effort: Pulmonary effort is normal. No respiratory distress.     Breath sounds: Normal breath sounds. No wheezing or rhonchi.  Musculoskeletal:        General: Normal range of motion.     Cervical back: Normal range of motion and neck supple.  Skin:    General: Skin is warm and dry.     Findings: No erythema or rash.  Neurological:     General: No focal deficit present.     Mental Status: She is alert and oriented to person, place, and time. Mental status is at baseline.  Psychiatric:        Mood and Affect: Mood normal.        Behavior: Behavior normal.        Thought Content: Thought content normal.        Judgment: Judgment normal.     Lab Results:  CBC    Component Value Date/Time   WBC 10.8 05/30/2023 1128   RBC 4.76 05/30/2023 1128   HGB 13.4 05/30/2023 1128   HCT 40.7 05/30/2023 1128   PLT 233 05/30/2023 1128   MCV 85.5 05/30/2023 1128   MCH 28.2 05/30/2023 1128   MCHC 32.9 05/30/2023 1128   RDW 13.5 05/30/2023 1128   LYMPHSABS 0.8 03/15/2023 1054   MONOABS 1.3 (H) 03/15/2023 1054   EOSABS 248 05/30/2023 1128   BASOSABS 65 05/30/2023 1128    BMET    Component Value Date/Time   NA 138 05/30/2023 1128   NA 143 03/09/2017 1247   K 4.5 05/30/2023 1128   CL 103 05/30/2023 1128   CO2 25 05/30/2023 1128   GLUCOSE 99 05/30/2023 1128   BUN 21 05/30/2023 1128   BUN 23 03/09/2017 1247   CREATININE 0.81 05/30/2023 1128   CALCIUM  9.2 05/30/2023 1128   GFRNONAA >60 03/19/2023 0245   GFRNONAA 59 (L) 10/26/2015 0929   GFRAA >60 12/28/2018 0315   GFRAA 68 10/26/2015 0929    BNP  Component Value Date/Time   BNP 375.6 (H) 12/02/2015 1611    ProBNP    Component Value Date/Time   PROBNP  136.0 (H) 08/25/2014 1518    Imaging: DG Chest 2 View Result Date: 12/18/2023 CLINICAL DATA:  Preoperative, COPD and chronic respiratory failure EXAM: CHEST - 2 VIEW COMPARISON:  08/22/2022 FINDINGS: The heart size and mediastinal contours are within normal limits. Emphysema. Disc degenerative disease of the thoracic spine. IMPRESSION: Emphysema without acute abnormality of the lungs. Electronically Signed   By: Marolyn JONETTA Jaksch M.D.   On: 12/18/2023 19:00     Assessment & Plan:    1. Sleep apnea, unspecified type (Primary)  2. Chronic respiratory failure with hypoxia (HCC) - Pulmonary Function Test; Future - DG Chest 2 View; Future  3. Interstitial lung disease (HCC)  4. COPD - Pulmonary Function Test; Future - DG Chest 2 View; Future   Assessment and Plan Assessment & Plan Chronic obstructive pulmonary disease (COPD) and chronic respiratory failure with hypoxia COPD is well-controlled with Breztri  inhaler, two puffs twice daily. No recent exacerbations or hospitalizations. Rare use of albuterol  rescue inhaler. Chronic respiratory failure managed with oxygen  therapy, using two to three liters at night and during exertion. Oxygen  levels occasionally drop to 88-89% at night. No recent bronchitis symptoms, wheezing, or chest tightness. Activities are limited due to breathlessness, but she manages with assistance and a chairlift for stairs. Intermediate risk for prolonged mechanical ventilation or postoperative pulmonary complications due to age, COPD, and chronic respiratory failure. Discussed surgical risks including prolonged mechanical ventilation, postoperative pneumonia, respiratory failure, and blood clots. Emphasized importance of post-surgery mobilization to mitigate these risks.  - CXR today showed emphysema without acute abnormality  - Perform breathing test to evaluate lung function before clearance  - Continue Breztri  inhaler, two puffs twice daily. - Continue oxygen  therapy,  two to three liters as needed. - Advise to get out of bed and perform light exercise post-surgery to reduce risk of postoperative pneumonia, respiratory failure, and blood clots.  Major Pulmonary risks identified in the multifactorial risk analysis are but not limited to a) pneumonia; b) recurrent intubation risk; c) prolonged or recurrent acute respiratory failure needing mechanical ventilation; d) prolonged hospitalization; e) DVT/Pulmonary embolism; f) Acute Pulmonary edema  Recommend 1. Short duration of surgery as much as possible and avoid paralytic if possible 2. Recovery in step down or ICU with Pulmonary consultation if needed 3. DVT prophylaxis 4. Aggressive pulmonary toilet with o2, bronchodilatation, and incentive spirometry and early ambulation  Obstructive sleep apnea Obstructive sleep apnea is well-controlled with CPAP therapy. Recent download from CPAP machine shows 100% compliance with usage over four hours per night, averaging six hours and fifty minutes. Apnea score is 1.2, indicating well-controlled condition. She reports feeling better with CPAP use and notices a difference when not using it. - Continue CPAP therapy with current settings.   1) RISK FOR PROLONGED MECHANICAL VENTILAION - > 48h  1A) Arozullah - Prolonged mech ventilation risk Arozullah Postperative Pulmonary Risk Score - for mech ventilation dependence >48h Us[facility], Ann [facility]urg 2000, major non-cardiac surgery) Comment Score  Type of surgery - abd ao aneurysm (27), thoracic (21), neurosurgery / upper abdominal / vascular (21), neck (11) Spinal decompression  21  Emergency Surgery - (11)  0  ALbumin  < 3 or poor nutritional state - (9)  0  BUN > 30 -  (8)  0  Partial or completely dependent functional status - (7)  0  COPD -  (6)  6  Age - 3 to 26 (4), > 70  (6)  6  TOTAL  33  Risk Stratifcation scores  - < 10 (0.5%), 11-19 (1.8%), 20-27 (4.2%), 28-40 (10.1%), >40 (26.6%)  (10.1%)      1B)  GUPTA - Prolonged Mech Vent Risk Score source Risk  Guptal post op prolonged mech ventilation > 48h or reintubation < 30 days - ACS 2007-2008 dataset - [email or web address] 3.0 % Risk of mechanical ventilation for >48 hrs after surgery, or unplanned intubation <=30 days of surgery    2) RISK FOR POST OP PNEUMONIA Score source Risk  Charlanne - Post Op Pnemounia risk  [email or web address] 4.1 % Risk of postoperative pneumonia    R3) ISK FOR ANY POST-OP PULMONARY COMPLICATION Score source Risk  CANET/ARISCAT Score - risk for ANY/ALl pulmonary complications - > risk of in-hospital post-op pulmonary complications (composite including respiratory failure, respiratory infection, pleural effusion, atelectasis, pneumothorax, bronchospasm, aspiration pneumonitis) [email or web address] - based on age, anemia, pulse ox, resp infection prior 30d, incision site, duration of surgery, and emergency v elective surgery Intermediate risk 13.3% risk of in-hospital post-op pulmonary complications (composite including respiratory failure, respiratory infection, pleural effusion, atelectasis, pneumothorax, bronchospasm, aspiration pneumonitis)       Almarie LELON Ferrari, NP 12/23/2023

## 2023-12-19 ENCOUNTER — Ambulatory Visit (INDEPENDENT_AMBULATORY_CARE_PROVIDER_SITE_OTHER): Admitting: *Deleted

## 2023-12-19 DIAGNOSIS — J9611 Chronic respiratory failure with hypoxia: Secondary | ICD-10-CM

## 2023-12-19 DIAGNOSIS — J449 Chronic obstructive pulmonary disease, unspecified: Secondary | ICD-10-CM

## 2023-12-19 LAB — PULMONARY FUNCTION TEST
DL/VA % pred: 106 %
DL/VA: 4.25 ml/min/mmHg/L
DLCO cor % pred: 76 %
DLCO cor: 15.21 ml/min/mmHg
DLCO unc % pred: 76 %
DLCO unc: 15.21 ml/min/mmHg
FEF 25-75 Pre: 0.96 L/s
FEF2575-%Pred-Pre: 76 %
FEV1-%Pred-Pre: 67 %
FEV1-Pre: 1.31 L
FEV1FVC-%Pred-Pre: 103 %
FEV6-%Pred-Pre: 70 %
FEV6-Pre: 1.75 L
FEV6FVC-%Pred-Pre: 106 %
FVC-%Pred-Pre: 66 %
FVC-Pre: 1.75 L
Pre FEV1/FVC ratio: 75 %
Pre FEV6/FVC Ratio: 100 %

## 2023-12-19 NOTE — Patient Instructions (Signed)
 Spirometry and diffusion capacity performed today.

## 2023-12-19 NOTE — Progress Notes (Signed)
 Spirometry and diffusion capacity performed today.

## 2023-12-20 ENCOUNTER — Ambulatory Visit: Admitting: Internal Medicine

## 2023-12-20 DIAGNOSIS — J449 Chronic obstructive pulmonary disease, unspecified: Secondary | ICD-10-CM | POA: Diagnosis not present

## 2023-12-20 DIAGNOSIS — G4733 Obstructive sleep apnea (adult) (pediatric): Secondary | ICD-10-CM | POA: Diagnosis not present

## 2023-12-20 DIAGNOSIS — J9611 Chronic respiratory failure with hypoxia: Secondary | ICD-10-CM | POA: Diagnosis not present

## 2023-12-21 ENCOUNTER — Ambulatory Visit: Attending: Internal Medicine | Admitting: Internal Medicine

## 2023-12-21 ENCOUNTER — Encounter: Payer: Self-pay | Admitting: Internal Medicine

## 2023-12-21 VITALS — BP 124/70 | HR 75 | Ht 66.0 in | Wt 195.2 lb

## 2023-12-21 DIAGNOSIS — I480 Paroxysmal atrial fibrillation: Secondary | ICD-10-CM

## 2023-12-21 DIAGNOSIS — I10 Essential (primary) hypertension: Secondary | ICD-10-CM | POA: Diagnosis not present

## 2023-12-21 DIAGNOSIS — I5032 Chronic diastolic (congestive) heart failure: Secondary | ICD-10-CM

## 2023-12-21 NOTE — Progress Notes (Signed)
 Cardiology Office Note   Date:  12/21/2023   ID:  Judy White, DOB 11-05-1940, MRN [id number]  PCP:  Judy Trula SQUIBB, MD  Cardiologist:   Judy Gull, MD   Patient returns for follow up of HTN    History of Present Illness: CHARLE White is a 83 y.o. female with a history of PAF (s/p ablation at [facility] by Dr Britta and then [facility] in 2016), CAD on CT scan (Ca score was 151 (50th percentile) in  April 2024), HL, HFpEF, ICH, HTN, OSA (on CPAP), DVT with PE, COPD and anxiety     She was previously followed by VEAR Sharps  Seen in Nov 2023      I saw the pt in 2024  Since then she has done OK   Has O2 as needed for SOB   No CP  No palpitations       Current Meds  Medication Sig   acetaminophen  (TYLENOL ) 500 MG tablet Take 500-1,000 mg by mouth every 6 (six) hours as needed for moderate pain (pain score 4-6) or headache.   albuterol  (VENTOLIN  HFA) 108 (90 Base) MCG/ACT inhaler Inhale 2 puffs into the lungs every 4 (four) hours as needed for wheezing or shortness of breath.   aspirin  81 MG chewable tablet Chew 81 mg by mouth 3 (three) times a week.   budesonide -glycopyrrolate -formoterol  (BREZTRI  AEROSPHERE) 160-9-4.8 MCG/ACT AERO inhaler Inhale 2 puffs into the lungs 2 (two) times daily.   Calcium -Vitamin D  (CALTRATE 600 PLUS-VIT D PO) Take 1 tablet by mouth daily.    Cholecalciferol  (VITAMIN D3) 2000 units TABS Take 2,000 Units by mouth daily.   clonazePAM  (KLONOPIN ) 1 MG tablet Take 1.5 tablets (1.5 mg total) by mouth in the morning. May also take 0.5 tablets (0.5 mg total) daily as needed.   clonazePAM  (KLONOPIN ) 1 MG tablet Take 1 tablet (1 mg total) by mouth 2 (two) times daily.   Evolocumab  (REPATHA ) 140 MG/ML SOSY Inject 140 mg into the skin every 14 (fourteen) days.   FLUoxetine  (PROZAC ) 20 MG capsule Take 1 capsule (20 mg total) by mouth daily with breakfast.   FLUoxetine  (PROZAC ) 20 MG capsule Take 1 capsule (20 mg total) by mouth every morning with food.   furosemide  (LASIX ) 20 MG  tablet Take 1 tablet (20 mg total) by mouth daily.   metoprolol  succinate (TOPROL -XL) 25 MG 24 hr tablet Take 0.5 tablets (12.5 mg total) by mouth daily.   Multiple Vitamins-Minerals (PRESERVISION AREDS PO) Take 1 each by mouth in the morning and at bedtime.   neomycin-polymyxin-dexamethasone  (MAXITROL) 0.1 % ophthalmic suspension Place 1 drop into the left eye 4 (four) times daily.   oxybutynin  (DITROPAN -XL) 10 MG 24 hr tablet Take 1 tablet (10 mg total) by mouth daily.   Potassium 99 MG TABS Take 99 mg by mouth daily.   vitamin C  (ASCORBIC ACID ) 500 MG tablet Take 500 mg by mouth daily.   vitamin E  180 MG (400 UNITS) capsule Take 400 Units daily by mouth.     Allergies:   Bupropion, Rosuvastatin , and Statins   Past Medical History:  Diagnosis Date   Agoraphobia    Anxiety    Arthritis    normal for the age; nothing gives me problems (01/24/2016)   COPD (chronic obstructive pulmonary disease) (HCC)    mild (01/24/2016)   Depressive disorder, not elsewhere classified    DVT (deep venous thrombosis) (HCC) 10/08/2015   they said the blood clot went from one of my  legs to my lung   Dysrhythmia    History of hiatal hernia    small one (01/24/2016)   Hypercholesteremia    Hypertension    pt denies this hx on 01/24/2016   Myalgia and myositis, unspecified    Osteopenia    Overactive bladder    Persistent atrial fibrillation (HCC)    a. s/p Watchman implant 12/2014   Presence of Watchman left atrial appendage closure device    Pulmonary embolism (HCC) 10/08/2015   Sleep apnea    USES CPAP     Past Surgical History:  Procedure Laterality Date   APPENDECTOMY  1983   ATRIAL FIBRILLATION ABLATION  06/28/2017   PER PATIENT SUCCESSFUL ABLATION , [facility] , LOV JANUARY 2020   BACK SURGERY     BRONCHOSCOPY  01/24/2016   CARDIAC CATHETERIZATION     >5 yrs   CATARACT EXTRACTION W/ INTRAOCULAR LENS  IMPLANT, BILATERAL Bilateral    CESAREAN SECTION  1967    I & D KNEE WITH POLY EXCHANGE Right 03/16/2023   Procedure: IRRIGATION AND DEBRIDEMENT KNEE WITH POLY EXCHANGE;  Surgeon: Vernetta Lonni GRADE, MD;  Location: [facility] ORS;  Service: Orthopedics;  Laterality: Right;   LEFT ATRIAL APPENDAGE OCCLUSION N/A 12/31/2014   Procedure: LEFT ATRIAL APPENDAGE OCCLUSION;  Surgeon: Lynwood Rakers, MD;  Location: [facility] INVASIVE CV LAB;  Service: Cardiovascular;  Laterality: N/A;   POSTERIOR FUSION LUMBAR SPINE Bilateral 07/2014   SHOULDER ARTHROSCOPY W/ ROTATOR CUFF REPAIR Left    SHOULDER SURGERY Right    tendon broke; couldn't be repaired   TEE WITHOUT CARDIOVERSION N/A 12/22/2014   Procedure: TRANSESOPHAGEAL ECHOCARDIOGRAM (TEE);  Surgeon: Vinie JAYSON Maxcy, MD;  Location: [facility] ENDOSCOPY;  Service: Cardiovascular;  Laterality: N/A;   TEE WITHOUT CARDIOVERSION N/A 02/16/2015   post Watchman TEE with good seal and no leak around device    TONSILLECTOMY     TOOTH EXTRACTION  05/2018   TOTAL ABDOMINAL HYSTERECTOMY     TOTAL KNEE ARTHROPLASTY Right 12/27/2018   Procedure: RIGHT TOTAL KNEE ARTHROPLASTY;  Surgeon: Vernetta Lonni GRADE, MD;  Location: [facility] OR[facility];  Service: Orthopedics;  Laterality: Right;   VIDEO BRONCHOSCOPY Bilateral 01/24/2016   Procedure: VIDEO BRONCHOSCOPY WITH FLUORO;  Surgeon: Lamar GORMAN Chris, MD;  Location: [facility] ENDOSCOPY;  Service: Cardiopulmonary;  Laterality: Bilateral;   WISDOM TOOTH EXTRACTION       Social History:  The patient  reports that she quit smoking about 8 years ago. Her smoking use included cigarettes. She started smoking about 68 years ago. She has a 30 pack-year smoking history. She has never used smokeless tobacco. She reports current alcohol use. She reports that she does not use drugs.   Family History:  The patient's family history includes Aneurysm in her father; Cancer in an other family member; Heart Problems in an other family member; Heart disease in her brother; Hypotension in her mother; Neuropathy in her brother.     ROS:  Please see the history of present illness. All other systems are reviewed and  Negative to the above problem except as noted.    PHYSICAL EXAM: VS:  BP 124/70   Pulse 75   Ht 5' 6 (1.676 m)   Wt 195 lb 3.2 oz (88.5 kg)   SpO2 94%   BMI 31.51 kg/m   GEN: Well nourished, well developed, in no acute distress  HEENT: normal  Neck: no JVD, no carotid bruits Cardiac: RRR;with occasional skip Respiratory:  clear to auscultation bilaterally,MOving air  GI: soft, nontender, no masses   No hepatomegaly  Ext   No LE edema   EKG:  EKG is ordered today.  NSR with PACs  78 bpm    Lipid Panel    Component Value Date/Time   CHOL 185 12/28/2017 0927   TRIG 218 (H) 12/28/2017 0927   HDL 41 12/28/2017 0927   CHOLHDL 4.5 (H) 12/28/2017 0927   LDLCALC 100 (H) 12/28/2017 0927      Wt Readings from Last 3 Encounters:  12/21/23 195 lb 3.2 oz (88.5 kg)  12/18/23 196 lb 6.4 oz (89.1 kg)  06/25/23 188 lb 12.8 oz (85.6 kg)      ASSESSMENT AND PLAN:  1  Hx PAF  s/p remote ablation   Also has Watchman [facility])  DOing well    Follow   2  Hx CAD  Coronary calcifications on CT scan   Score 151 in 2024   Rx risk factors   No angina  3  HTN  BP is well controlled   4  Lipids  WIll get labs from Dr Janette office  5  Metabolics  Will see if A1C checked    Follw up next fall    Current medicines are reviewed at length with the patient today.  The patient does not have concerns regarding medicines.  Signed, Judy Gull, MD  12/21/2023 9:34 AM

## 2023-12-21 NOTE — Patient Instructions (Signed)
 Medication Instructions:  Your physician recommends that you continue on your current medications as directed. Please refer to the Current Medication list given to you today.  *If you need a refill on your cardiac medications before your next appointment, please call your pharmacy*  Follow-Up: At [facility], you and your health needs are our priority.  As part of our continuing mission to provide you with exceptional heart care, our providers are all part of one team.  This team includes your primary Cardiologist (physician) and Advanced Practice Providers or APPs (Physician Assistants and Nurse Practitioners) who all work together to provide you with the care you need, when you need it.  Your next appointment:   1 year(s)  Provider:   Vina Gull, MD   We recommend signing up for the patient portal called MyChart.  Sign up information is provided on this After Visit Summary.  MyChart is used to connect with patients for Virtual Visits (Telemedicine).  Patients are able to view lab/test results, encounter notes, upcoming appointments, etc.  Non-urgent messages can be sent to your provider as well.    To learn more about what you can do with MyChart, go to [email or web address].

## 2023-12-23 ENCOUNTER — Ambulatory Visit: Payer: Self-pay | Admitting: Primary Care

## 2023-12-24 ENCOUNTER — Other Ambulatory Visit: Payer: Self-pay | Admitting: Neurological Surgery

## 2023-12-24 NOTE — Progress Notes (Signed)
 I spoke with the pt and notified of results/recs per [facility]. She verbalized understanding. I faxed her cxr result and note to Dr. Colon.

## 2023-12-24 NOTE — Telephone Encounter (Signed)
 Risk assessment faxed to Dr. Colon.

## 2023-12-25 ENCOUNTER — Other Ambulatory Visit (HOSPITAL_COMMUNITY): Payer: Self-pay

## 2023-12-25 DIAGNOSIS — F3341 Major depressive disorder, recurrent, in partial remission: Secondary | ICD-10-CM | POA: Diagnosis not present

## 2023-12-25 MED ORDER — CLONAZEPAM 1 MG PO TABS
1.0000 mg | ORAL_TABLET | Freq: Two times a day (BID) | ORAL | 5 refills | Status: AC
  Filled 2024-01-13: qty 60, 30d supply, fill #0
  Filled 2024-02-13: qty 60, 30d supply, fill #1
  Filled 2024-03-24: qty 60, 30d supply, fill #2

## 2023-12-25 MED ORDER — FLUOXETINE HCL 20 MG PO CAPS
20.0000 mg | ORAL_CAPSULE | ORAL | 2 refills | Status: AC
  Filled 2024-02-22: qty 90, 90d supply, fill #0

## 2023-12-28 ENCOUNTER — Other Ambulatory Visit: Payer: Self-pay

## 2023-12-28 ENCOUNTER — Other Ambulatory Visit (HOSPITAL_COMMUNITY): Payer: Self-pay

## 2023-12-28 DIAGNOSIS — J449 Chronic obstructive pulmonary disease, unspecified: Secondary | ICD-10-CM | POA: Diagnosis not present

## 2023-12-28 DIAGNOSIS — I509 Heart failure, unspecified: Secondary | ICD-10-CM | POA: Diagnosis not present

## 2023-12-28 DIAGNOSIS — E782 Mixed hyperlipidemia: Secondary | ICD-10-CM | POA: Diagnosis not present

## 2023-12-28 DIAGNOSIS — I10 Essential (primary) hypertension: Secondary | ICD-10-CM | POA: Diagnosis not present

## 2023-12-29 ENCOUNTER — Other Ambulatory Visit (HOSPITAL_COMMUNITY): Payer: Self-pay

## 2023-12-31 ENCOUNTER — Other Ambulatory Visit (HOSPITAL_COMMUNITY): Payer: Self-pay

## 2023-12-31 ENCOUNTER — Other Ambulatory Visit: Payer: Self-pay

## 2023-12-31 ENCOUNTER — Encounter: Payer: Self-pay | Admitting: Radiology

## 2023-12-31 MED ORDER — BREZTRI AEROSPHERE 160-9-4.8 MCG/ACT IN AERO
2.0000 | INHALATION_SPRAY | Freq: Two times a day (BID) | RESPIRATORY_TRACT | 3 refills | Status: AC
  Filled 2023-12-31: qty 10.7, 30d supply, fill #0
  Filled 2024-01-28: qty 10.7, 30d supply, fill #1
  Filled 2024-02-21: qty 10.7, 30d supply, fill #2
  Filled 2024-03-28: qty 10.7, 30d supply, fill #3

## 2024-01-02 ENCOUNTER — Other Ambulatory Visit (HOSPITAL_COMMUNITY): Payer: Self-pay

## 2024-01-07 DIAGNOSIS — J449 Chronic obstructive pulmonary disease, unspecified: Secondary | ICD-10-CM | POA: Diagnosis not present

## 2024-01-07 DIAGNOSIS — G4733 Obstructive sleep apnea (adult) (pediatric): Secondary | ICD-10-CM | POA: Diagnosis not present

## 2024-01-08 NOTE — Pre-Procedure Instructions (Signed)
 Surgical Instructions   Your procedure is scheduled on Tuesday, November 18th  Report to [facility] Main Entrance A at 0920 A.M., then check in with the Admitting office. Any questions or running late day of surgery: call [phone]  Questions prior to your surgery date: call [phone], Monday-Friday, 8am-4pm. If you experience any cold or flu symptoms such as cough, fever, chills, shortness of breath, etc. between now and your scheduled surgery, please notify us  at the above number.     Remember:  Do not eat or drink after midnight the night before your surgery  Take these medicines the morning of surgery with A SIP OF WATER : budesonide -glycopyrrolate -formoterol  (BREZTRI ) inhaler -please bring it with you to the hospital clonazePAM  (KLONOPIN )  FLUoxetine  (PROZAC )  metoprolol  succinate (TOPROL -XL) neomycin-polymyxin-dexamethasone  (MAXITROL) eye drops oxybutynin  (DITROPAN -XL)   May take these medicines IF NEEDED: acetaminophen  (TYLENOL )  albuterol  (VENTOLIN  HFA) inhaler -please bring it with you to the hospital  Follow your surgeon's instructions on when to stop Aspirin ; if no instructions given - call your surgeon's office.  One week prior to surgery, STOP taking any Aleve, Naproxen, Ibuprofen, Motrin, Advil, Goody's, BC's, all herbal medications, fish oil, and non-prescription vitamins.                     Do NOT Smoke (Tobacco/Vaping) for 24 hours prior to your procedure.  If you use a CPAP at night, you may bring your mask/headgear for your overnight stay.   You will be asked to remove any contacts, glasses, piercing's, hearing aid's, dentures/partials prior to surgery. Please bring cases for these items if needed.    Patients discharged the day of surgery will not be allowed to drive home, and someone needs to stay with them for 24 hours.  SURGICAL WAITING ROOM VISITATION Patients may have no more than 2 support people in the waiting area - these visitors may  rotate.   Pre-op nurse will coordinate an appropriate time for 1 ADULT support person, who may not rotate, to accompany patient in pre-op.  Children under the age of 46 must have an adult with them who is not the patient and must remain in the main waiting area with an adult.  If the patient needs to stay at the hospital during part of their recovery, the visitor guidelines for inpatient rooms apply.  Please refer to the [facility] website for the visitor guidelines for any additional information.   If you received a COVID test during your pre-op visit  it is requested that you wear a mask when out in public, stay away from anyone that may not be feeling well and notify your surgeon if you develop symptoms. If you have been in contact with anyone that has tested positive in the last 10 days please notify you surgeon.      Pre-operative 4 CHG Bathing Instructions   You can play a [place]all role in reducing the risk of infection after surgery. Your skin needs to be as free of germs as possible. You can reduce the number of germs on your skin by washing with CHG (chlorhexidine  gluconate) soap before surgery. CHG is an antiseptic soap that kills germs and continues to kill germs even after washing.   DO NOT use if you have an allergy to chlorhexidine /CHG or antibacterial soaps. If your skin becomes reddened or irritated, stop using the CHG and notify one of our RNs at [phone].   Please shower with the CHG soap starting 4 days before  surgery using the following schedule:     Please keep in mind the following:  DO NOT shave, including legs and underarms, starting the day of your first shower.   You may shave your face at any point before/day of surgery.  Place clean sheets on your bed the day you start using CHG soap. Use a clean washcloth (not used since being washed) for each shower. DO NOT sleep with pets once you start using the CHG.   CHG Shower Instructions:  Wash your face and  private area with normal soap. If you choose to wash your hair, wash first with your normal shampoo.  After you use shampoo/soap, rinse your hair and body thoroughly to remove shampoo/soap residue.  Turn the water  OFF and apply  bottle of CHG soap to a CLEAN washcloth.  Apply CHG soap ONLY FROM YOUR NECK DOWN TO YOUR TOES (washing for 3-5 minutes)  DO NOT use CHG soap on face, private areas, open wounds, or sores.  Pay special attention to the area where your surgery is being performed.  If you are having back surgery, having someone wash your back for you may be helpful. Wait 2 minutes after CHG soap is applied, then you may rinse off the CHG soap.  Pat dry with a clean towel  Put on clean clothes/pajamas   If you choose to wear lotion, please use ONLY the CHG-compatible lotions that are listed below.  Additional instructions for the day of surgery:  If you choose, you may shower the morning of surgery with an antibacterial soap.  DO NOT APPLY any lotions, deodorants, cologne, or perfumes.   Do not bring valuables to the hospital. [facility] is not responsible for any belongings/valuables. Do not wear nail polish, gel polish, artificial nails, or any other type of covering on natural nails (fingers and toes) Do not wear jewelry or makeup Put on clean/comfortable clothes.  Please brush your teeth.  Ask your nurse before applying any prescription medications to the skin.     CHG Compatible Lotions   Aveeno Moisturizing lotion  Cetaphil Moisturizing Cream  Cetaphil Moisturizing Lotion  Clairol Herbal Essence Moisturizing Lotion, Dry Skin  Clairol Herbal Essence Moisturizing Lotion, Extra Dry Skin  Clairol Herbal Essence Moisturizing Lotion, Normal Skin  Curel Age Defying Therapeutic Moisturizing Lotion with Alpha Hydroxy  Curel Extreme Care Body Lotion  Curel Soothing Hands Moisturizing Hand Lotion  Curel Therapeutic Moisturizing Cream, Fragrance-Free  Curel Therapeutic  Moisturizing Lotion, Fragrance-Free  Curel Therapeutic Moisturizing Lotion, Original Formula  Eucerin Daily Replenishing Lotion  Eucerin Dry Skin Therapy Plus Alpha Hydroxy Crme  Eucerin Dry Skin Therapy Plus Alpha Hydroxy Lotion  Eucerin Original Crme  Eucerin Original Lotion  Eucerin Plus Crme Eucerin Plus Lotion  Eucerin TriLipid Replenishing Lotion  Keri Anti-Bacterial Hand Lotion  Keri Deep Conditioning Original Lotion Dry Skin Formula Softly Scented  Keri Deep Conditioning Original Lotion, Fragrance Free Sensitive Skin Formula  Keri Lotion Fast Absorbing Fragrance Free Sensitive Skin Formula  Keri Lotion Fast Absorbing Softly Scented Dry Skin Formula  Keri Original Lotion  Keri Skin Renewal Lotion Keri Silky Smooth Lotion  Keri Silky Smooth Sensitive Skin Lotion  Nivea Body Creamy Conditioning Oil  Nivea Body Extra Enriched Teacher, Adult Education Moisturizing Lotion Nivea Crme  Nivea Skin Firming Lotion  NutraDerm 30 Skin Lotion  NutraDerm Skin Lotion  NutraDerm Therapeutic Skin Cream  NutraDerm Therapeutic Skin Lotion  ProShield Protective Hand Cream  Provon moisturizing lotion  Please read over the following fact sheets that you were given.

## 2024-01-09 ENCOUNTER — Encounter (HOSPITAL_COMMUNITY)
Admission: RE | Admit: 2024-01-09 | Discharge: 2024-01-09 | Disposition: A | Discharge status: Home / Self Care | Source: Ambulatory Visit | Attending: Neurological Surgery | Admitting: Neurological Surgery

## 2024-01-09 ENCOUNTER — Other Ambulatory Visit: Payer: Self-pay

## 2024-01-09 ENCOUNTER — Encounter (HOSPITAL_COMMUNITY): Payer: Self-pay

## 2024-01-09 VITALS — BP 115/52 | HR 92 | Temp 100.0°F | Resp 17 | Ht 66.0 in | Wt 200.0 lb

## 2024-01-09 DIAGNOSIS — Z7901 Long term (current) use of anticoagulants: Secondary | ICD-10-CM | POA: Insufficient documentation

## 2024-01-09 DIAGNOSIS — Z01818 Encounter for other preprocedural examination: Secondary | ICD-10-CM | POA: Diagnosis present

## 2024-01-09 DIAGNOSIS — F4000 Agoraphobia, unspecified: Secondary | ICD-10-CM | POA: Diagnosis not present

## 2024-01-09 DIAGNOSIS — Z981 Arthrodesis status: Secondary | ICD-10-CM | POA: Diagnosis not present

## 2024-01-09 DIAGNOSIS — Z86711 Personal history of pulmonary embolism: Secondary | ICD-10-CM | POA: Insufficient documentation

## 2024-01-09 DIAGNOSIS — Z87891 Personal history of nicotine dependence: Secondary | ICD-10-CM | POA: Insufficient documentation

## 2024-01-09 DIAGNOSIS — J449 Chronic obstructive pulmonary disease, unspecified: Secondary | ICD-10-CM | POA: Diagnosis not present

## 2024-01-09 DIAGNOSIS — E7800 Pure hypercholesterolemia, unspecified: Secondary | ICD-10-CM | POA: Insufficient documentation

## 2024-01-09 DIAGNOSIS — I4819 Other persistent atrial fibrillation: Secondary | ICD-10-CM | POA: Insufficient documentation

## 2024-01-09 DIAGNOSIS — Z86718 Personal history of other venous thrombosis and embolism: Secondary | ICD-10-CM | POA: Diagnosis not present

## 2024-01-09 DIAGNOSIS — K449 Diaphragmatic hernia without obstruction or gangrene: Secondary | ICD-10-CM | POA: Insufficient documentation

## 2024-01-09 DIAGNOSIS — Z96651 Presence of right artificial knee joint: Secondary | ICD-10-CM | POA: Diagnosis not present

## 2024-01-09 DIAGNOSIS — Z01812 Encounter for preprocedural laboratory examination: Secondary | ICD-10-CM | POA: Insufficient documentation

## 2024-01-09 DIAGNOSIS — G4733 Obstructive sleep apnea (adult) (pediatric): Secondary | ICD-10-CM | POA: Insufficient documentation

## 2024-01-09 DIAGNOSIS — Z8673 Personal history of transient ischemic attack (TIA), and cerebral infarction without residual deficits: Secondary | ICD-10-CM | POA: Insufficient documentation

## 2024-01-09 DIAGNOSIS — M48062 Spinal stenosis, lumbar region with neurogenic claudication: Secondary | ICD-10-CM | POA: Insufficient documentation

## 2024-01-09 LAB — SURGICAL PCR SCREEN
MRSA, PCR: NEGATIVE
Staphylococcus aureus: NEGATIVE

## 2024-01-09 LAB — CBC
HCT: 40.8 % (ref 36.0–46.0)
Hemoglobin: 13.4 g/dL (ref 12.0–15.0)
MCH: 29.6 pg (ref 26.0–34.0)
MCHC: 32.8 g/dL (ref 30.0–36.0)
MCV: 90.3 fL (ref 80.0–100.0)
Platelets: 195 K/uL (ref 150–400)
RBC: 4.52 MIL/uL (ref 3.87–5.11)
RDW: 13 % (ref 11.5–15.5)
WBC: 19.8 K/uL — ABNORMAL HIGH (ref 4.0–10.5)
nRBC: 0 % (ref 0.0–0.2)

## 2024-01-09 LAB — BASIC METABOLIC PANEL WITH GFR
Anion gap: 10 (ref 5–15)
BUN: 17 mg/dL (ref 8–23)
CO2: 27 mmol/L (ref 22–32)
Calcium: 8.9 mg/dL (ref 8.9–10.3)
Chloride: 99 mmol/L (ref 98–111)
Creatinine, Ser: 0.99 mg/dL (ref 0.44–1.00)
GFR, Estimated: 56 mL/min — ABNORMAL LOW (ref >60)
Glucose, Bld: 137 mg/dL — ABNORMAL HIGH (ref 70–99)
Potassium: 4.2 mmol/L (ref 3.5–5.1)
Sodium: 136 mmol/L (ref 135–145)

## 2024-01-09 NOTE — Progress Notes (Signed)
 PCP - Dwight Trula SQUIBB, MD  Cardiologist - Okey Vina GAILS, MD (LOV 12-21-23) Watchmen in 2016 Pulmonary Disease - Hope Almarie ORN, NP ARNETTA 12-18-23)  PPM/ICD -  denies Device Orders - n/a Rep Notified - n/a  Chest x-ray - 12-18-23 EKG - 12-21-23 Stress Test - 05-30-13 ECHO - 10-09-15 Cardiac Cath - 2017  Sleep Study - greater than 5 years ago CPAP - nightly O2 - prn   Dm- denies  Blood Thinner Instructions: denies Aspirin  Instructions: Last dose 12-31-23  ERAS Protcol - NPO   COVID TEST- n/a   Anesthesia review: Yes, Hx of HTN, COPD, OSA,DVT,PE   Patient denies shortness of breath, fever, cough and chest pain at PAT appointment   All instructions explained to the patient, with a verbal understanding of the material. Patient agrees to go over the instructions while at home for a better understanding. Patient also instructed to self quarantine after being tested for COVID-19. The opportunity to ask questions was provided.

## 2024-01-10 ENCOUNTER — Encounter (HOSPITAL_COMMUNITY): Payer: Self-pay

## 2024-01-10 ENCOUNTER — Ambulatory Visit (HOSPITAL_BASED_OUTPATIENT_CLINIC_OR_DEPARTMENT_OTHER): Admitting: Student

## 2024-01-10 ENCOUNTER — Encounter (HOSPITAL_COMMUNITY): Payer: Self-pay | Admitting: Vascular Surgery

## 2024-01-10 DIAGNOSIS — M25461 Effusion, right knee: Secondary | ICD-10-CM

## 2024-01-10 DIAGNOSIS — Z8739 Personal history of other diseases of the musculoskeletal system and connective tissue: Secondary | ICD-10-CM | POA: Diagnosis not present

## 2024-01-10 MED ORDER — LIDOCAINE HCL 1 % IJ SOLN
4.0000 mL | INTRAMUSCULAR | Status: AC | PRN
  Administered 2024-01-10: 4 mL

## 2024-01-10 NOTE — Progress Notes (Signed)
 Chief Complaint: Right knee pain     History of Present Illness:    Judy White is a 83 y.o. female who presents today for evaluation of right knee pain.  She underwent a total knee arthroplasty in this knee back in 2020, and in January of this year underwent an I&D and poly exchange with Dr. Vernetta.  She was placed on IV antibiotics for 6 weeks and was followed by infectious disease.  At the time there were significantly elevated white counts although cultures remain negative.  Patient reports today that her knee suddenly began hurting yesterday without any known cause or injury.  She was unable to weight-bear at the time.  Her knee feels slightly better today and she is able to ambulate cautiously with use of a cane.  Denies any fever or chills.  She is scheduled for a lumbar procedure next Tuesday with Dr. Colon.   Surgical History:   Right TKA 2020, right knee I&D with poly exchange January 2025  PMH/PSH/Family History/Social History/Meds/Allergies:    Past Medical History:  Diagnosis Date   Agoraphobia    Anxiety    Arthritis    normal for the age; nothing gives me problems (01/24/2016)   COPD (chronic obstructive pulmonary disease) (HCC)    mild (01/24/2016)   Depressive disorder, not elsewhere classified    DVT (deep venous thrombosis) (HCC) 10/08/2015   they said the blood clot went from one of my legs to my lung   Dysrhythmia    History of hiatal hernia    small one (01/24/2016)   Hypercholesteremia    Hypertension    pt denies this hx on 01/24/2016   Myalgia and myositis, unspecified    Osteopenia    Overactive bladder    Persistent atrial fibrillation (HCC)    a. s/p Watchman implant 12/2014   Presence of Watchman left atrial appendage closure device    Pulmonary embolism (HCC) 10/08/2015   Sleep apnea    USES CPAP    Past Surgical History:  Procedure Laterality Date   APPENDECTOMY  1983   ATRIAL FIBRILLATION  ABLATION  06/28/2017   PER PATIENT SUCCESSFUL ABLATION , CARDIO [facility] AND [facility] , LOV JANUARY 2020   BACK SURGERY     BRONCHOSCOPY  01/24/2016   CARDIAC CATHETERIZATION     >5 yrs   CATARACT EXTRACTION W/ INTRAOCULAR LENS  IMPLANT, BILATERAL Bilateral    CESAREAN SECTION  1967   I & D KNEE WITH POLY EXCHANGE Right 03/16/2023   Procedure: IRRIGATION AND DEBRIDEMENT KNEE WITH POLY EXCHANGE;  Surgeon: Vernetta Lonni GRADE, MD;  Location: [facility] OR[facility];  Service: Orthopedics;  Laterality: Right;   LEFT ATRIAL APPENDAGE OCCLUSION N/A 12/31/2014   Procedure: LEFT ATRIAL APPENDAGE OCCLUSION;  Surgeon: Lynwood Rakers, MD;  Location: [facility]AS[facility];  Service: Cardiovascular;  Laterality: N/A;   POSTERIOR FUSION LUMBAR SPINE Bilateral 07/2014   SHOULDER ARTHROSCOPY W/ ROTATOR CUFF REPAIR Left    SHOULDER SURGERY Right    tendon broke; couldn't be repaired   TEE WITHOUT CARDIOVERSION N/A 12/22/2014   Procedure: TRANSESOPHAGEAL ECHOCARDIOGRAM (TEE);  Surgeon: Vinie JAYSON Maxcy, MD;  Location: [facility] ENDOSCOPY;  Service: Cardiovascular;  Laterality: N/A;   TEE WITHOUT CARDIOVERSION N/A 02/16/2015   post Watchman TEE with good seal and no leak around device  TONSILLECTOMY     TOOTH EXTRACTION  05/2018   TOTAL ABDOMINAL HYSTERECTOMY     TOTAL KNEE ARTHROPLASTY Right 12/27/2018   Procedure: RIGHT TOTAL KNEE ARTHROPLASTY;  Surgeon: Vernetta Lonni GRADE, MD;  Location: [facility];  Service: Orthopedics;  Laterality: Right;   VIDEO BRONCHOSCOPY Bilateral 01/24/2016   Procedure: VIDEO BRONCHOSCOPY WITH FLUORO;  Surgeon: Lamar GORMAN Chris, MD;  Location: [facility] ENDOSCOPY;  Service: Cardiopulmonary;  Laterality: Bilateral;   WISDOM TOOTH EXTRACTION     Social History   Socioeconomic History   Marital status: Widowed    Spouse name: Not on file   Number of children: Not on file   Years of education: Not on file   Highest education level: Not on file  Occupational History   Not on file  Tobacco Use    Smoking status: Former    Current packs/day: 0.00    Average packs/day: 0.5 packs/day for 60.0 years (30.0 ttl pk-yrs)    Types: Cigarettes    Start date: 10/08/1955    Quit date: 10/08/2015    Years since quitting: 8.2   Smokeless tobacco: Never  Vaping Use   Vaping status: Never Used  Substance and Sexual Activity   Alcohol use: Yes    Alcohol/week: 0.0 standard drinks of alcohol    Comment: occ   Drug use: No   Sexual activity: Not on file  Other Topics Concern   Not on file  Social History Narrative   Not on file   Social Drivers of Health   Financial Resource Strain: Not on file  Food Insecurity: No Food Insecurity (03/19/2023)   Hunger Vital Sign    Worried About Running Out of Food in the Last Year: Never true    Ran Out of Food in the Last Year: Never true  Transportation Needs: No Transportation Needs (03/19/2023)   PRAPARE - Administrator, Civil Service (Medical): No    Lack of Transportation (Non-Medical): No  Physical Activity: Not on file  Stress: Not on file  Social Connections: Unknown (03/19/2023)   Social Connection and Isolation Panel    Frequency of Communication with Friends and Family: More than three times a week    Frequency of Social Gatherings with Friends and Family: More than three times a week    Attends Religious Services: Patient declined    Database Administrator or Organizations: Yes    Attends Banker Meetings: More than 4 times per year    Marital Status: Widowed   Family History  Problem Relation Age of Onset   Aneurysm Father    Hypotension Mother    Neuropathy Brother    Heart disease Brother    Cancer Other        maternal side   Heart Problems Other        paternal side   Heart attack Neg Hx    Allergies  Allergen Reactions   Rosuvastatin      Myalgias on 5mg  every other day   Statins    Bupropion Anxiety   Current Outpatient Medications  Medication Sig Dispense Refill   acetaminophen  (TYLENOL )  500 MG tablet Take 500-1,000 mg by mouth every 6 (six) hours as needed for moderate pain (pain score 4-6) or headache.     albuterol  (VENTOLIN  HFA) 108 (90 Base) MCG/ACT inhaler Inhale 2 puffs into the lungs every 4 (four) hours as needed for wheezing or shortness of breath. 8 g 3   aspirin  81 MG chewable tablet  Chew 81 mg by mouth every Monday, Wednesday, and Friday.     budesonide -glycopyrrolate -formoterol  (BREZTRI  AEROSPHERE) 160-9-4.8 MCG/ACT AERO inhaler Inhale 2 puffs into the lungs 2 (two) times daily. 10.7 g 3   Calcium -Vitamin D  (CALTRATE 600 PLUS-VIT D PO) Take 1 tablet by mouth daily.      Cholecalciferol  (VITAMIN D3) 2000 units TABS Take 2,000 Units by mouth daily.     clonazePAM  (KLONOPIN ) 1 MG tablet Take 1.5 tablets (1.5 mg total) by mouth in the morning. May also take 0.5 tablets (0.5 mg total) daily as needed. (Patient not taking: Reported on 01/07/2024) 60 tablet 5   clonazePAM  (KLONOPIN ) 1 MG tablet Take 1 tablet (1 mg total) by mouth 2 (two) times daily. (Patient not taking: Reported on 01/07/2024) 60 tablet 3   clonazePAM  (KLONOPIN ) 1 MG tablet Take 1 tablet (1 mg total) by mouth 2 (two) times daily. 60 tablet 5   clonazePAM  (KLONOPIN ) 1 MG tablet Take 1 tablet (1 mg total) by mouth 2 (two) times daily. 60 tablet 5   Evolocumab  (REPATHA ) 140 MG/ML SOSY Inject 140 mg into the skin every 14 (fourteen) days. 2 mL 1   FLUoxetine  (PROZAC ) 20 MG capsule Take 1 capsule (20 mg total) by mouth daily with breakfast. (Patient not taking: Reported on 01/07/2024) 90 capsule 2   FLUoxetine  (PROZAC ) 20 MG capsule Take 1 capsule (20 mg total) by mouth every morning with food. 90 capsule 2   FLUoxetine  (PROZAC ) 20 MG capsule Take 1 capsule (20 mg total) by mouth every morning with food. 90 capsule 2   furosemide  (LASIX ) 20 MG tablet Take 1 tablet (20 mg total) by mouth daily. 90 tablet 0   metoprolol  succinate (TOPROL -XL) 25 MG 24 hr tablet Take 0.5 tablets (12.5 mg total) by mouth daily. 45  tablet 3   Multiple Vitamins-Minerals (CENTRUM SILVER ADULT 50+) TABS Take 1 tablet by mouth daily.     Multiple Vitamins-Minerals (PRESERVISION AREDS PO) Take 1 each by mouth in the morning and at bedtime.     neomycin-polymyxin-dexamethasone  (MAXITROL) 0.1 % ophthalmic suspension Place 1 drop into the left eye 4 (four) times daily. 5 mL 0   NON FORMULARY      oxybutynin  (DITROPAN -XL) 10 MG 24 hr tablet Take 1 tablet (10 mg total) by mouth daily. 90 tablet 3   Potassium 99 MG TABS Take 99 mg by mouth daily.     vitamin C  (ASCORBIC ACID ) 500 MG tablet Take 500 mg by mouth daily.     vitamin E  180 MG (400 UNITS) capsule Take 400 Units daily by mouth.     No current facility-administered medications for this visit.   No results found.  Review of Systems:   A ROS was performed including pertinent positives and negatives as documented in the HPI.  Physical Exam :   Constitutional: NAD and appears stated age Neurological: Alert and oriented Psych: Appropriate affect and cooperative There were no vitals taken for this visit.   Comprehensive Musculoskeletal Exam:    Exam of the right knee demonstrates a previous TKA incision which is well-healed.  There is some mild erythema and warmth throughout the knee.  No diffuse tenderness with palpation.  Active range of motion from 5 to 90 degrees.  Calf is supple and nontender.  Imaging:     Assessment:   83 y.o. female with history of right knee TKA.  In January 2025 she underwent an I&D with poly exchange due to significantly elevated white counts and suspicion  for infection however cultures continue to come back negative.  She was placed on IV antibiotics.  Yesterday she began developing acute pain and swelling within the knee and has had some difficulty weightbearing.  The knee is mildly warm on exam but I was able to aspirate 35 cc of yellow cloudy fluid that I will send for synovial analysis with culture.  Discussed this with Dr. Vernetta who  would like to see her back in clinic this upcoming Monday to discuss results and treatment plan as well as consider if she can proceed with lumbar surgery on 11/18 as planned.  Plan :    - Right knee aspiration performed today and sent for synovial fluid analysis with culture - Follow-up with Dr. Vernetta on 11/17 - Strict return precautions given     Procedure Note  Patient: JAMIAH HOMEYER             Date of Birth: [birth year]           MRN: [id number]             Visit Date: 01/10/2024  Procedures: Visit Diagnoses:  1. 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 Consent was given by the patient. Immediately prior to procedure a time out was called to verify the correct patient, procedure, equipment, support staff and site/side marked as required. Patient was prepped and draped in the usual sterile fashion.       I personally saw and evaluated the patient, and participated in the management and treatment plan.  Leonce Reveal, PA-C Orthopedics

## 2024-01-10 NOTE — Progress Notes (Addendum)
 Anesthesia Chart Review:  Case: [id number] Date/Time: 01/15/24 1106   Procedure: LUMBAR LAMINECTOMY/DECOMPRESSION MICRODISCECTOMY 1 LEVEL (Back) - Sublaminar decompression L3-L4   Anesthesia type: General   Diagnosis: Spinal stenosis, lumbar region, with neurogenic claudication [M48.062]   Pre-op diagnosis: Spinal stenosis of lumbar region with neurogenic claudication   [facility] ROOM [facility] / [facility]   Surgeons: Colon Shove, MD       DISCUSSION: Patient is an 83 year old female scheduled for the above procedure.  History includes former smoker (quit 10/08/2015), COPD (home O2 2-3L as needed), OSA (CPAP & 2L O2), afib (diagnosed ~ 2007; s/p LLA occluder/Watchman 12/31/2014; s/p afib/flutter catheter ablation 06/28/2017 at [facility]), hypercholesterolemia, DVT/PE (LLE DVT, right PE 10/08/2015), hiatal hernia, agoraphobia, osteoarthritis (right TKA 12/27/2018; s/p open arthrotomy with synovectomy, I&D right knee with polyethylene liner exchange 03/16/2023, s/p empiric PJI coverage with daptomycin  and Rocephin  x 6 weeks), SDH (insetting of warfarin, s/p FFP/Vit K 11/12/2005) spinal surgery (L4-5 PSF 08/11/2014), bronchoscopy (for persistent bilateral infiltrates 01/24/2016, c/b left PTX s/p chest tube, BAL+ Pseudomonas).  Last cardiology evaluation with Dr. Vina Gull was on 12/21/2023 for PAF follow-up. She was doing well. Had Coronary calcium  score of 151 (50th percentile) in 05/2022. No angina. Continue medical therapy. Lipids followed by primary care. One year follow-up planned.   Pulmonology evaluation on 12/18/2023 by Almarie Ferrari, NP for follow-up COPD on 2-3 L home O2 as needed and for OSA with CPAP with O2 use. COPD felt well controlled and breathing stable on Breztri . She was compliant with CPAP. She has chronic DOE and uses a chairlift for stairs. CXR showed emphysema without acute abnormality. In regards to surgery, Intermediate risk for prolonged mechanical ventilation or postoperative pulmonary  complications due to age, COPD, and chronic respiratory failure. Discussed surgical risks.SABRASABRAEmphasized importance of post-surgery mobilization to mitigate these risks....  Recommend 1. Short duration of surgery as much as possible and avoid paralytic if possible 2. Recovery in step down or ICU with Pulmonary consultation if needed 3. DVT prophylaxis 4. Aggressive pulmonary toilet with o2, bronchodilatation, and incentive spirometry and early ambulation.  She completed over six weeks of (IV->PO) antibiotics following I&D and poly liner exchange on 03/16/2023 for right knee infection involving her prior right TKR from 2020.  She did well off antibiotics for 3 months and as needed orthopedic follow-up recommended in June 2025. In the interim, she was re-evaluated by ortho Randene Reveal, PA-C) on 01/10/2024 for one day history of right knee pain. She had denied fever or chills. He noted pans for lumbar surgery on 01/15/2024. Right knee incision was noted to be well healed with mild erythema and warmth throughout the knee. Aspiration performed with synovial fluid since for analysis and culture. He communicated with Dr. Vernetta with plan for follow-up on 01/14/2024. In the interim, 01/09/2024 lab results reviewed showing WBC 19.8K. I contacted Katie at Dr. Thayer office about the elevated WBC and ongoing right knee evaluation, and he would like her CBC rechecked prior to surgery. I also reached out to [facility] at Dr. Damian office to make sure he was aware of her leukocytosis in setting of her right knee pain/erythema, and inquired if he would like to start antibiotic therapy or even consider ED referral. Sherrie discussed this with Dr. Vernetta, and he plans to follow synovial fluid analysis and cultures, and re-evaluate on 01/14/2024 as scheduled.   I did call and speak with Earnie Murray, and with her permission, I also called and spoke with her only son  Franky Murray. Conversations were similar with each.  We reviewed her elevated WBC. She denied recent steroids. I told them of the recommendations I had received from Dr. Colon and Dr. Vernetta. She understands proceeding with back surgery will likely depend on what her right knee culture shows. She said her knee feels some better following aspiration. She has been able to walk with a cane. Her temperature was recorded at 37.8C on 01/09/2024, but she had not noticed any fever. She checked her temperature while we were on the phone, and it was 98.3. She wore a sweatshirt today in her home, but did not describe any definite chills. Other than her back pain and some right knee discomfort, she otherwise feels good. Her son was with her for several hours  on 01/10/2024, and confirmed she was acting normally, without confusion or lethargy. She reports chronic diarrhea, but no worsening. She may notice a slight more respiratory mucous then her baseline, but overall feels her breathing has been good. No known rash or dysuria. They both expressed some frustration that they feel she may be headed down the same path as in January 2025--That time they took her to the ED (WBC 28.4K then), did multiple cultures, had surgery, took multiple antibiotics and still without known causative agent. They are not inclined to go to the ED currently given she is feeling okay and was already evaluated by ortho today (01/10/2024). They would prefer she wait and see Dr. Vernetta as scheduled but were receptive to my concerns she could worsen over the next few days without treatment (particularly given her advanced age), and were agreeable that should she develop a fever, chills, worsening knee pain or redness, confusion, lethargy, or generally feeling poor that she should go to the ED asap.  Will leave chart for follow-up.   ADDENDUM 01/14/2024 10:55 AM: Reviewed today's note by Dr. Lonni Vernetta. Right knee synovial fluid aspirate from 01/10/2024 is still marked as collected  without results yet in [facility], but per Dr. Damian note, That fluid was turbid and shows a high nucleated cell count of 73,000. This is concerning for an infection again. There is no organisms and no crystals. New specimen sent today, and CBC with differential repeated--results are pending. Right knee was not swollen with minimal effusion by ortho exam, but due to concern she may be fighting underlying infection, he felt her back surgery scheduled for 01/15/2024 should likely be cancelled.    VS: BP (!) 115/52   Pulse 92   Temp 37.8 C   Resp 17   Ht 5' 6 (1.676 m)   Wt 90.7 kg   SpO2 95%   BMI 32.28 kg/m    PROVIDERS: Dwight Trula SQUIBB, MD is PCP Okey Moccasin, MD is cardiologist Vernetta Lonni, MD is orthopedic surgeon Dennise Kingsley, MD is ID Shelah Charleston, MD is pulmonologist. Primarily has been seeing Hope Norris, NP.   LABS: See DISCUSSION.  (all labs ordered are listed, but only abnormal results are displayed)  Labs Reviewed  BASIC METABOLIC PANEL WITH GFR - Abnormal; Notable for the following components:      Result Value   Glucose, Bld 137 (*)    GFR, Estimated 56 (*)    All other components within normal limits  CBC - Abnormal; Notable for the following components:   WBC 19.8 (*)    All other components within normal limits  SURGICAL PCR SCREEN     IMAGES: CXR 12/18/2023: FINDINGS: The heart size and mediastinal contours are  within normal limits. Emphysema. Disc degenerative disease of the thoracic spine. IMPRESSION: Emphysema without acute abnormality of the lungs.  MRI L-spine 11/14/2023 (Canopy/PACS): IMPRESSION: 1. Mildly transitional anatomy, chronic L4-L5 fusion designated, with chronic lumbosacral junction fusion or ankylosis. 2. Progressed adjacent segment disease at L3-L4 since 2016 MRI. Moderate multifactorial spinal stenosis now, moderate to severe right greater than left foraminal stenosis. 3. L2-L3 degeneration has progressed, no  significant spinal stenosis or convincing neural impingement at this time. 4. Lower thoracic hyperostosis related interbody ankylosis.    EKG: 12/21/2023: Sinus rhythm with PACs Confirmed by Okey Moccasin ([id number]) on 12/21/2023 1:20:46 PM   CV: CT Cardiac Calcium  Scoring 06/22/2022: Ascending Aorta: Normal diameter 3.2 cm Pericardium: Normal Coronary arteries: Mild 3 vessel calcium  LM 0 LAD 149 LCX 1.23 RCA 0.954 Total 151   Patient also appears to have a Watchman device implanted in the LAA   IMPRESSION: Coronary calcium  score of 151. This was 50 th percentile for age and sex matched control.   Long term patch monitor 01/05/2022 - 01/19/2022:   Basic rhythm is NSR with average HR 68 bpm.   Brief SVT episode, all self terminating within 30 seconds or less.   SVE burden is 13%   No atrial fibrillation or ventricular tachycardia   OVERALL, the study is benign and she remains free of A Fib.   US  Carotid 02/08/2016: Impression: Minimal plaque bilaterally, and serpentine distal ICAs. 1 to 39% bilateral ICA stenosis. Patent vertebral arteries with antegrade flow. Normal subclavian arteries, bilaterally.  No: Cardiac ectopy throughout exam. Follow-up as needed.   Nuclear stress test 05/30/2013: Overall Impression:  Normal stress nuclear study. LV Wall Motion:  NL LV Function, EF 76%; NL Wall Motion    negative cardiac catheterization in 1997   Past Medical History:  Diagnosis Date   Agoraphobia    Anxiety    Arthritis    normal for the age; nothing gives me problems (01/24/2016)   COPD (chronic obstructive pulmonary disease) (HCC)    mild (01/24/2016)   Depressive disorder, not elsewhere classified    DVT (deep venous thrombosis) (HCC) 10/08/2015   they said the blood clot went from one of my legs to my lung   Dysrhythmia    History of hiatal hernia    small one (01/24/2016)   Hypercholesteremia    Hypertension    pt denies this hx on 01/24/2016    Myalgia and myositis, unspecified    Osteopenia    Overactive bladder    Persistent atrial fibrillation (HCC)    a. s/p Watchman implant 12/2014   Presence of Watchman left atrial appendage closure device    Pulmonary embolism (HCC) 10/08/2015   Sleep apnea    USES CPAP     Past Surgical History:  Procedure Laterality Date   APPENDECTOMY  1983   ATRIAL FIBRILLATION ABLATION  06/28/2017   PER PATIENT SUCCESSFUL ABLATION , CARDIO [facility] [place] , LOV JANUARY 2020   BACK SURGERY     BRONCHOSCOPY  01/24/2016   CARDIAC CATHETERIZATION     >5 yrs   CATARACT EXTRACTION W/ INTRAOCULAR LENS  IMPLANT, BILATERAL Bilateral    CESAREAN SECTION  1967   I & D KNEE WITH POLY EXCHANGE Right 03/16/2023   Procedure: IRRIGATION AND DEBRIDEMENT KNEE WITH POLY EXCHANGE;  Surgeon: Vernetta Lonni GRADE, MD;  Location: [facility] ORS;  Service: Orthopedics;  Laterality: Right;   LEFT ATRIAL APPENDAGE OCCLUSION N/A 12/31/2014   Procedure: LEFT ATRIAL APPENDAGE OCCLUSION;  Surgeon:  Lynwood Rakers, MD;  Location: [facility] INVASIVE CV LAB;  Service: Cardiovascular;  Laterality: N/A;   POSTERIOR FUSION LUMBAR SPINE Bilateral 07/2014   SHOULDER ARTHROSCOPY W/ ROTATOR CUFF REPAIR Left    SHOULDER SURGERY Right    tendon broke; couldn't be repaired   TEE WITHOUT CARDIOVERSION N/A 12/22/2014   Procedure: TRANSESOPHAGEAL ECHOCARDIOGRAM (TEE);  Surgeon: Vinie JAYSON Maxcy, MD;  Location: [facility] ENDOSCOPY;  Service: Cardiovascular;  Laterality: N/A;   TEE WITHOUT CARDIOVERSION N/A 02/16/2015   post Watchman TEE with good seal and no leak around device    TONSILLECTOMY     TOOTH EXTRACTION  05/2018   TOTAL ABDOMINAL HYSTERECTOMY     TOTAL KNEE ARTHROPLASTY Right 12/27/2018   Procedure: RIGHT TOTAL KNEE ARTHROPLASTY;  Surgeon: Vernetta Lonni GRADE, MD;  Location: [facility]S;  Service: Orthopedics;  Laterality: Right;   VIDEO BRONCHOSCOPY Bilateral 01/24/2016   Procedure: VIDEO BRONCHOSCOPY WITH FLUORO;  Surgeon: Lamar GORMAN Chris, MD;  Location: [facility] ENDOSCOPY;  Service: Cardiopulmonary;  Laterality: Bilateral;   WISDOM TOOTH EXTRACTION      MEDICATIONS:  acetaminophen  (TYLENOL ) 500 MG tablet   albuterol  (VENTOLIN  HFA) 108 (90 Base) MCG/ACT inhaler   aspirin  81 MG chewable tablet   budesonide -glycopyrrolate -formoterol  (BREZTRI  AEROSPHERE) 160-9-4.8 MCG/ACT AERO inhaler   Calcium -Vitamin D  (CALTRATE 600 PLUS-VIT D PO)   Cholecalciferol  (VITAMIN D3) 2000 units TABS   clonazePAM  (KLONOPIN ) 1 MG tablet   clonazePAM  (KLONOPIN ) 1 MG tablet   clonazePAM  (KLONOPIN ) 1 MG tablet   clonazePAM  (KLONOPIN ) 1 MG tablet   Evolocumab  (REPATHA ) 140 MG/ML SOSY   FLUoxetine  (PROZAC ) 20 MG capsule   FLUoxetine  (PROZAC ) 20 MG capsule   FLUoxetine  (PROZAC ) 20 MG capsule   furosemide  (LASIX ) 20 MG tablet   metoprolol  succinate (TOPROL -XL) 25 MG 24 hr tablet   Multiple Vitamins-Minerals (CENTRUM SILVER ADULT 50+) TABS   Multiple Vitamins-Minerals (PRESERVISION AREDS PO)   neomycin-polymyxin-dexamethasone  (MAXITROL) 0.1 % ophthalmic suspension   NON FORMULARY   oxybutynin  (DITROPAN -XL) 10 MG 24 hr tablet   Potassium 99 MG TABS   vitamin C  (ASCORBIC ACID ) 500 MG tablet   vitamin E  180 MG (400 UNITS) capsule   No current facility-administered medications for this encounter.    Isaiah Ruder, PA-C Surgical Short Stay/Anesthesiology [facility] Phone [phone] [place] Phone [phone] 01/10/2024 7:44 PM

## 2024-01-13 ENCOUNTER — Other Ambulatory Visit (HOSPITAL_COMMUNITY): Payer: Self-pay

## 2024-01-14 ENCOUNTER — Other Ambulatory Visit (HOSPITAL_COMMUNITY): Payer: Self-pay

## 2024-01-14 ENCOUNTER — Ambulatory Visit: Admitting: Orthopaedic Surgery

## 2024-01-14 ENCOUNTER — Encounter: Payer: Self-pay | Admitting: Orthopaedic Surgery

## 2024-01-14 ENCOUNTER — Other Ambulatory Visit: Payer: Self-pay

## 2024-01-14 DIAGNOSIS — M25461 Effusion, right knee: Secondary | ICD-10-CM | POA: Diagnosis not present

## 2024-01-14 DIAGNOSIS — Z8739 Personal history of other diseases of the musculoskeletal system and connective tissue: Secondary | ICD-10-CM | POA: Diagnosis not present

## 2024-01-14 NOTE — Progress Notes (Signed)
 The patient is a 83 year old female who we are seeing today due to right knee pain and swelling.  She actually had a knee replacement done remotely back and October 2020.  In January of this year she had acute swelling and synovitis of the right knee and we performed an open irrigation debridement with synovectomy and a poly exchange.  She had never been on antibiotics and nothing grew out from her knee.  However she was seen by infectious disease specialist and was on 6 weeks of IV antibiotics followed by oral antibiotics.  She has been off antibiotics for a while now.  She is actually scheduled to have a lumbar laminectomy with decompression and microdiscectomy at 1 level tomorrow by Dr. Colon.  However her peripheral white blood cell count last week was over 19,000.  She was having knee pain and swelling and did see one of the physician assistants Leonce Fruits at [facility] and he took fluid off of her knee.  That fluid was turbid and shows a high nucleated cell count of 73,000.  This is concerning for an infection again.  There is no organisms and no crystals.  She denies any fever and chills and has not felt bad at all.  Her son who is a it sales professional is with her today.  On examination of her right knee today there is no swelling.  There is a minimal effusion and I did aspirate about 10 to 15 cc of fluid off of her right knee today.  She has no significant pain with range of motion of the knee.  I do need to send this off for a cell count and Gram stain and culture as well.  We will also check a peripheral white blood cell count today.  Her son feels that the knee is not the main source of blood going on with her and I feel like it is more likely the knee but certainly I understand his concerns since there have been no organisms before with her knee.  Regardless, I do feel that her surgery tomorrow she would likely be canceled given the fact that her body may be fighting some type of infection and I do  feel it is most likely the knee.  I will call them with results of the labs and we can see her back in 1 week for further follow-up.

## 2024-01-14 NOTE — Addendum Note (Signed)
 Addended by: EILLEEN ROSINA HERO on: 01/14/2024 10:44 AM   Modules accepted: Orders

## 2024-01-15 ENCOUNTER — Ambulatory Visit (HOSPITAL_COMMUNITY): Admission: RE | Admit: 2024-01-15 | Source: Home / Self Care | Admitting: Neurological Surgery

## 2024-01-15 ENCOUNTER — Encounter (HOSPITAL_COMMUNITY): Admission: RE | Payer: Self-pay | Source: Home / Self Care

## 2024-01-15 ENCOUNTER — Other Ambulatory Visit: Payer: Self-pay

## 2024-01-15 SURGERY — LUMBAR LAMINECTOMY/DECOMPRESSION MICRODISCECTOMY 1 LEVEL
Anesthesia: General | Site: Back

## 2024-01-16 ENCOUNTER — Other Ambulatory Visit: Payer: Self-pay

## 2024-01-16 ENCOUNTER — Other Ambulatory Visit (HOSPITAL_COMMUNITY): Payer: Self-pay

## 2024-01-16 ENCOUNTER — Telehealth: Payer: Self-pay | Admitting: Orthopaedic Surgery

## 2024-01-16 NOTE — Telephone Encounter (Signed)
 Patient called and said she had an infection in her knees and when she came to her appointment Per Vernetta after he did some drawing from her knees she stated he said he would contact her back for the results and she never heard anything. CB#[phone]

## 2024-01-16 NOTE — Telephone Encounter (Signed)
 Scott from [facility] called and needed the order form that was created for the lab work that the doctor ordered. CB#[phone]  Fax#[phone]

## 2024-01-16 NOTE — Telephone Encounter (Signed)
 Refaxed Requisition to [facility]

## 2024-01-18 ENCOUNTER — Telehealth: Payer: Self-pay | Admitting: Radiology

## 2024-01-18 NOTE — Telephone Encounter (Signed)
 Since this was lost by [facility] she will coming back in Monday

## 2024-01-18 NOTE — Telephone Encounter (Signed)
 Scott from [facility] called stating that he needs the order for [name] labs to be faxed to him so that they can try to locate the labs. [facility] says that they never rec'd the specimen, and at this time are missing.  Order was faxed to [facility] @ [phone]. He is going to do a search for the specimen.

## 2024-01-20 DIAGNOSIS — G4733 Obstructive sleep apnea (adult) (pediatric): Secondary | ICD-10-CM | POA: Diagnosis not present

## 2024-01-20 DIAGNOSIS — J9611 Chronic respiratory failure with hypoxia: Secondary | ICD-10-CM | POA: Diagnosis not present

## 2024-01-21 ENCOUNTER — Encounter: Payer: Self-pay | Admitting: Orthopaedic Surgery

## 2024-01-21 ENCOUNTER — Ambulatory Visit: Admitting: Orthopaedic Surgery

## 2024-01-21 DIAGNOSIS — Z8739 Personal history of other diseases of the musculoskeletal system and connective tissue: Secondary | ICD-10-CM | POA: Diagnosis not present

## 2024-01-21 DIAGNOSIS — G8929 Other chronic pain: Secondary | ICD-10-CM | POA: Diagnosis not present

## 2024-01-21 DIAGNOSIS — M25461 Effusion, right knee: Secondary | ICD-10-CM

## 2024-01-21 DIAGNOSIS — M25561 Pain in right knee: Secondary | ICD-10-CM | POA: Diagnosis not present

## 2024-01-21 LAB — SPECIMEN STATUS

## 2024-01-21 LAB — BODY FLUID CULTURE

## 2024-01-21 LAB — SPECIMEN STATUS REPORT

## 2024-01-21 LAB — SYNOVIAL FLUID, CELL COUNT

## 2024-01-21 NOTE — Progress Notes (Signed)
 The patient comes in today a week after we saw her in the office in the draw a peripheral CBC.  Finally we found that white blood cell count and it came back to below 8 which was normal.  Her peripheral white blood cell count at the day of her short stay visit prior to canceling her lumbar spine surgery was 19,000.  She does report some knee pain.  Her original aspiration of that knee 2 weeks ago showed no organisms and minimal white cells on the Gram stain but the cell count in the aspiration was high in terms of white blood cell count.  We sent off additional fluid from her knee last week but was lost by the lab.  On exam her right knee she has only minimal effusion I was able to aspirate about 10 cc of fluid from her knee.  We will send this off for cell count with Gram stain and culture today.  She knows I will be in touch with her about these findings.  If it shows a low white count and no organisms, it would be fine with proceeding with her lumbar spine surgery.  Will be in touch about this.

## 2024-01-22 ENCOUNTER — Telehealth: Payer: Self-pay

## 2024-01-22 NOTE — Telephone Encounter (Signed)
 Scott with [facility] called stating that they are not able to locate specimen.  CB# [phone].  Please advise.

## 2024-01-23 ENCOUNTER — Telehealth: Payer: Self-pay | Admitting: Orthopaedic Surgery

## 2024-01-23 NOTE — Telephone Encounter (Signed)
 Erica from [facility] called regarding a lab order that they received. Call back number is [phone]

## 2024-01-23 NOTE — Telephone Encounter (Signed)
 [facility] aware Aerobic and anaerobic culture is needed and cell ct

## 2024-01-24 LAB — CBC WITH DIFFERENTIAL/PLATELET
Basophils Absolute: 0.1 x10E3/uL (ref 0.0–0.2)
Basos: 1 %
EOS (ABSOLUTE): 0.1 x10E3/uL (ref 0.0–0.4)
Eos: 1 %
Hematocrit: 42.7 % (ref 34.0–46.6)
Hemoglobin: 13.8 g/dL (ref 11.1–15.9)
Immature Grans (Abs): 0 x10E3/uL (ref 0.0–0.1)
Immature Granulocytes: 0 %
Lymphocytes Absolute: 1.3 x10E3/uL (ref 0.7–3.1)
Lymphs: 17 %
MCH: 30.1 pg (ref 26.6–33.0)
MCHC: 32.3 g/dL (ref 31.5–35.7)
MCV: 93 fL (ref 79–97)
Monocytes Absolute: 0.6 x10E3/uL (ref 0.1–0.9)
Monocytes: 7 %
Neutrophils Absolute: 5.7 x10E3/uL (ref 1.4–7.0)
Neutrophils: 74 %
Platelets: 280 x10E3/uL (ref 150–450)
RBC: 4.58 x10E6/uL (ref 3.77–5.28)
RDW: 12.9 % (ref 11.7–15.4)
WBC: 7.8 x10E3/uL (ref 3.4–10.8)

## 2024-01-24 LAB — SYNOVIAL FLUID, CELL COUNT
Eos, Fluid: 0 %
Lining Cells, Synovial: 2 %
Lymphs, Fluid: 49 %
Macrophages Fld: 0 %
Nuc cell # Fld: 256 {cells}/uL — AB (ref 0–200)
Polys, Fluid: 49 %

## 2024-01-24 LAB — GRAM STAIN: Organism ID, Bacteria: NONE SEEN

## 2024-01-24 LAB — SPECIMEN STATUS REPORT

## 2024-01-26 LAB — BODY FLUID CULTURE

## 2024-01-27 DIAGNOSIS — I10 Essential (primary) hypertension: Secondary | ICD-10-CM | POA: Diagnosis not present

## 2024-01-27 DIAGNOSIS — I509 Heart failure, unspecified: Secondary | ICD-10-CM | POA: Diagnosis not present

## 2024-01-27 DIAGNOSIS — M779 Enthesopathy, unspecified: Secondary | ICD-10-CM | POA: Diagnosis not present

## 2024-01-27 DIAGNOSIS — J449 Chronic obstructive pulmonary disease, unspecified: Secondary | ICD-10-CM | POA: Diagnosis not present

## 2024-01-27 DIAGNOSIS — E782 Mixed hyperlipidemia: Secondary | ICD-10-CM | POA: Diagnosis not present

## 2024-01-28 ENCOUNTER — Other Ambulatory Visit: Payer: Self-pay

## 2024-01-28 ENCOUNTER — Other Ambulatory Visit (HOSPITAL_COMMUNITY): Payer: Self-pay

## 2024-01-28 MED ORDER — NAPROXEN 500 MG PO TABS
500.0000 mg | ORAL_TABLET | Freq: Two times a day (BID) | ORAL | 0 refills | Status: DC | PRN
  Filled 2024-01-28: qty 10, 5d supply, fill #0

## 2024-01-31 ENCOUNTER — Other Ambulatory Visit (HOSPITAL_COMMUNITY): Payer: Self-pay

## 2024-02-01 ENCOUNTER — Other Ambulatory Visit: Payer: Self-pay

## 2024-02-05 ENCOUNTER — Other Ambulatory Visit: Payer: Self-pay | Admitting: Neurological Surgery

## 2024-02-06 ENCOUNTER — Other Ambulatory Visit: Payer: Self-pay

## 2024-02-06 ENCOUNTER — Encounter (HOSPITAL_COMMUNITY): Payer: Self-pay | Admitting: Neurological Surgery

## 2024-02-06 NOTE — Anesthesia Preprocedure Evaluation (Addendum)
 Anesthesia Evaluation  Patient identified by MRN, date of birth, ID band Patient awake    Reviewed: Allergy & Precautions, NPO status , Patient's Chart, lab work & pertinent test results  History of Anesthesia Complications Negative for: history of anesthetic complications  Airway Mallampati: II  TM Distance: >3 FB Neck ROM: Full    Dental  (+) Teeth Intact, Implants   Pulmonary sleep apnea, Continuous Positive Airway Pressure Ventilation and Oxygen  sleep apnea , COPD,  COPD inhaler and oxygen  dependent, Patient abstained from smoking.Not current smoker, former smoker    + decreased breath sounds      Cardiovascular Exercise Tolerance: Good METShypertension, Pt. on medications (-) CAD and (-) Past MI + dysrhythmias Atrial Fibrillation  Rhythm:Regular Rate:Normal - Systolic murmurs    Neuro/Psych  PSYCHIATRIC DISORDERS Anxiety Depression    negative neurological ROS     GI/Hepatic hiatal hernia,GERD  Controlled,,(+)     (-) substance abuse    Endo/Other  neg diabetes    Renal/GU negative Renal ROS     Musculoskeletal  (+) Arthritis ,    Abdominal   Peds  Hematology   Anesthesia Other Findings Per PAT note: History includes former smoker (quit 10/08/2015), COPD (home O2 2-3L as needed), OSA (CPAP & 2L O2), afib (diagnosed ~ 2007; s/p LLA occluder/Watchman 12/31/2014; s/p afib/flutter catheter ablation [facility]/28/2017 at [facility]), hypercholesterolemia, DVT/PE (LLE DVT, right PE 10/08/2015), hiatal hernia, agoraphobia, osteoarthritis (right TKA 12/27/2018; s/p open arthrotomy with synovectomy, I&D right knee with polyethylene liner exchange 03/16/2023, s/p empiric PJI coverage with daptomycin  and Rocephin  x 6 weeks), SDH (insetting of warfarin, s/p FFP/Vit K 11/12/2005) spinal surgery (L4-5 PSF 08/11/2014), bronchoscopy (for persistent bilateral infiltrates 01/24/2016, c/b left PTX s/p chest tube, BAL+ Pseudomonas).   Last cardiology  evaluation with Dr. Vina Gull was on 12/21/2023 for PAF follow-up. She was doing well. Had Coronary calcium  score of 151 (50th percentile) in 05/2022. No angina. Continue medical therapy. Lipids followed by primary care. One year follow-up planned.    Pulmonology evaluation on 12/18/2023 by Almarie Ferrari, NP for follow-up COPD on 2-3 L home O2 as needed and for OSA with CPAP with O2 use. COPD felt well controlled and breathing stable on Breztri . She was compliant with CPAP. She has chronic DOE and uses a chairlift for stairs. CXR showed emphysema without acute abnormality. In regards to surgery, Intermediate risk for prolonged mechanical ventilation or postoperative pulmonary complications due to age, COPD, and chronic respiratory failure.   Surgery was initially scheduled for 01/15/2024, but it was postponed due to concern for recurrent right prosthetic knee infection as she developed right knee swelling and pain with WBC elevated at 19.8K.  See my previous note from date of service 01/09/2024. Since then she had follow-up with Dr. Lonni Poli. Right knee aspirate showed no growth by 01/14/24 and 01/21/2024 cultures. WBC improved to 7.8K. She was rescheduled for lumbar laminectomy.     Reproductive/Obstetrics                              Anesthesia Physical Anesthesia Plan  ASA: 3  Anesthesia Plan: General   Post-op Pain Management: Tylenol  PO (pre-op)*   Induction: Intravenous  PONV Risk Score and Plan: 4 or greater and Ondansetron , Dexamethasone  and Treatment may vary due to age or medical condition  Airway Management Planned: Oral ETT  Additional Equipment: None  Intra-op Plan:   Post-operative Plan: Extubation in OR  Informed Consent: I  have reviewed the patients History and Physical, chart, labs and discussed the procedure including the risks, benefits and alternatives for the proposed anesthesia with the patient or authorized representative  who has indicated his/her understanding and acceptance.     Dental advisory given  Plan Discussed with: CRNA and Surgeon  Anesthesia Plan Comments: (Discussed risks of anesthesia with patient, including PONV, sore throat, lip/dental/eye damage, post operative cognitive dysfunction. Rare risks discussed as well, such as cardiorespiratory and neurological sequelae, and allergic reactions. Discussed the role of CRNA in patient's perioperative care. Patient understands.)         Anesthesia Quick Evaluation

## 2024-02-06 NOTE — Progress Notes (Signed)
 PCP - Dr Trula Brim Cardiologist - Dr Vina Gull Infectious Diseases - Dr Loney Stank Pulmonology - Dr Lamar Chris  Chest x-ray - 12/18/23 EKG - 12/21/23 Stress Test - 06/18/17 CE ECHO - 10/09/15 Cardiac Cath - 06/28/17 CE  ICD Pacemaker/Loop - n/a  Sleep Study -  Yes (04/08/18) CPAP - uses CPAP nightly  Diabetes - n/a  Blood Thinner Instructions:  n/a  Aspirin  Instructions: Follow your surgeon's instructions on when to stop aspirin  prior to surgery,  If no instructions were given by your surgeon then you will need to call the office for those instructions.  NPO  Anesthesia review: Yes  STOP now taking any Aspirin  (unless otherwise instructed by your surgeon), Aleve , Naproxen , Ibuprofen, Motrin, Advil, Goody's, BC's, all herbal medications, fish oil, and all vitamins.   Coronavirus Screening Do you have any of the following symptoms:  Cough yes/no: No Fever (>100.73F)  yes/no: No Runny nose yes/no: No Sore throat yes/no: No Difficulty breathing/shortness of breath  yes/no: No  Have you traveled in the last 14 days and where? yes/no: No  Patient verbalized understanding of instructions that were given via phone.

## 2024-02-06 NOTE — Progress Notes (Signed)
 Anesthesia Chart Review: CANDELARIA MICHAE POLITE  Case: [id number] Date/Time: 02/07/24 1315   Procedure: LUMBAR LAMINECTOMY/DECOMPRESSION MICRODISCECTOMY 1 LEVEL (Back) - Sublaminar decompression L3-L4   Anesthesia type: General   Diagnosis: Spinal stenosis, lumbar region, with neurogenic claudication [M48.062]   Pre-op diagnosis: Spinal stenosis of lumbar region with neurogenic claudication   [facility] ROOM 18 / [facility]   Surgeons: Colon Shove, MD       DISCUSSION: Patient is an 83 year old female scheduled for the above procedure.  Surgery was initially scheduled for 01/15/2024, but it was postponed due to concern for recurrent right prosthetic knee infection as she developed right knee swelling and pain with WBC elevated at 19.8K.  See my previous note from date of service 01/09/2024. Since then she had follow-up with Dr. Lonni Poli. Right knee aspirate showed no growth by 01/14/24 and 01/21/2024 cultures. WBC improved to 7.8K. She was rescheduled for lumbar laminectomy.  Most recent labs in [facility] include: Lab Results  Component Value Date   WBC 7.8 01/14/2024   WBC WILL FOLLOW 01/14/2024   HGB 13.8 01/14/2024   HGB WILL FOLLOW 01/14/2024   HCT 42.7 01/14/2024   HCT WILL FOLLOW 01/14/2024   PLT 280 01/14/2024   PLT WILL FOLLOW 01/14/2024   GLUCOSE 137 (H) 01/09/2024   ALT 10 05/30/2023   AST 13 05/30/2023   NA 136 01/09/2024   K 4.2 01/09/2024   CL 99 01/09/2024   CREATININE 0.99 01/09/2024   BUN 17 01/09/2024   CO2 27 01/09/2024    See my previous progress note that outlined history and last cardiac testing.   Anesthesia team to evaluate on the day of surgery.   Isaiah Ruder, PA-C Surgical Short Stay/Anesthesiology [facility] Phone [phone] [facility] Phone [phone] 02/06/2024 12:27 PM

## 2024-02-07 ENCOUNTER — Observation Stay (HOSPITAL_COMMUNITY)
Admission: RE | Admit: 2024-02-07 | Discharge: 2024-02-08 | Disposition: A | Attending: Neurological Surgery | Admitting: Neurological Surgery

## 2024-02-07 ENCOUNTER — Ambulatory Visit (HOSPITAL_COMMUNITY): Admitting: Vascular Surgery

## 2024-02-07 ENCOUNTER — Encounter (HOSPITAL_COMMUNITY): Payer: Self-pay | Admitting: Neurological Surgery

## 2024-02-07 ENCOUNTER — Ambulatory Visit (HOSPITAL_COMMUNITY)

## 2024-02-07 ENCOUNTER — Encounter: Admission: RE | Disposition: A | Payer: Self-pay | Attending: Neurological Surgery

## 2024-02-07 DIAGNOSIS — M48062 Spinal stenosis, lumbar region with neurogenic claudication: Principal | ICD-10-CM | POA: Diagnosis present

## 2024-02-07 HISTORY — PX: LUMBAR LAMINECTOMY/DECOMPRESSION MICRODISCECTOMY: SHX5026

## 2024-02-07 SURGERY — LUMBAR LAMINECTOMY/DECOMPRESSION MICRODISCECTOMY 1 LEVEL
Anesthesia: General | Site: Back

## 2024-02-07 MED ORDER — FENTANYL CITRATE (PF) 250 MCG/5ML IJ SOLN
INTRAMUSCULAR | Status: DC | PRN
  Administered 2024-02-07: 50 ug via INTRAVENOUS

## 2024-02-07 MED ORDER — CLONAZEPAM 1 MG PO TABS
1.5000 mg | ORAL_TABLET | Freq: Every morning | ORAL | Status: DC
  Administered 2024-02-08: 1.5 mg via ORAL
  Filled 2024-02-07: qty 1

## 2024-02-07 MED ORDER — ACETAMINOPHEN 500 MG PO TABS
1000.0000 mg | ORAL_TABLET | Freq: Once | ORAL | Status: DC
  Filled 2024-02-07: qty 2

## 2024-02-07 MED ORDER — SODIUM CHLORIDE 0.9% FLUSH
3.0000 mL | Freq: Two times a day (BID) | INTRAVENOUS | Status: DC
  Administered 2024-02-07: 3 mL via INTRAVENOUS

## 2024-02-07 MED ORDER — PHENYLEPHRINE HCL-NACL 20-0.9 MG/250ML-% IV SOLN
INTRAVENOUS | Status: DC | PRN
  Administered 2024-02-07: 25 ug/min via INTRAVENOUS
  Administered 2024-02-07: 15 ug/min via INTRAVENOUS

## 2024-02-07 MED ORDER — OXYCODONE HCL 5 MG/5ML PO SOLN: 5.0000 mg | Freq: Once | ORAL | Status: DC | PRN

## 2024-02-07 MED ORDER — CLONAZEPAM 1 MG PO TABS: 1.0000 mg | ORAL_TABLET | Freq: Two times a day (BID) | ORAL | Status: DC

## 2024-02-07 MED ORDER — OXYBUTYNIN CHLORIDE ER 10 MG PO TB24
10.0000 mg | ORAL_TABLET | Freq: Every day | ORAL | Status: DC
  Filled 2024-02-07: qty 1

## 2024-02-07 MED ORDER — OXYCODONE-ACETAMINOPHEN 5-325 MG PO TABS
1.0000 | ORAL_TABLET | Freq: Four times a day (QID) | ORAL | Status: DC | PRN
  Administered 2024-02-07 – 2024-02-08 (×2): 1 via ORAL
  Filled 2024-02-07: qty 2
  Filled 2024-02-07: qty 1

## 2024-02-07 MED ORDER — CEFAZOLIN SODIUM-DEXTROSE 2-4 GM/100ML-% IV SOLN
2.0000 g | INTRAVENOUS | Status: AC
  Administered 2024-02-07: 2 g via INTRAVENOUS
  Filled 2024-02-07: qty 100

## 2024-02-07 MED ORDER — MENTHOL 3 MG MT LOZG: 1.0000 | LOZENGE | OROMUCOSAL | Status: DC | PRN

## 2024-02-07 MED ORDER — BUPIVACAINE HCL (PF) 0.5 % IJ SOLN
INTRAMUSCULAR | Status: AC
  Filled 2024-02-07: qty 30

## 2024-02-07 MED ORDER — SODIUM CHLORIDE 0.9 % IV SOLN: 250.0000 mL | INTRAVENOUS | Status: DC

## 2024-02-07 MED ORDER — ACETAMINOPHEN 325 MG PO TABS
650.0000 mg | ORAL_TABLET | ORAL | Status: DC | PRN
  Administered 2024-02-08: 650 mg via ORAL
  Filled 2024-02-07: qty 2

## 2024-02-07 MED ORDER — SENNA 8.6 MG PO TABS
1.0000 | ORAL_TABLET | Freq: Two times a day (BID) | ORAL | Status: DC
  Administered 2024-02-07: 8.6 mg via ORAL
  Filled 2024-02-07: qty 1

## 2024-02-07 MED ORDER — BUPIVACAINE HCL (PF) 0.5 % IJ SOLN
INTRAMUSCULAR | Status: DC | PRN
  Administered 2024-02-07: 25 mL
  Administered 2024-02-07: 5 mL

## 2024-02-07 MED ORDER — EVOLOCUMAB 140 MG/ML ~~LOC~~ SOSY: 140.0000 mg | PREFILLED_SYRINGE | SUBCUTANEOUS | Status: DC

## 2024-02-07 MED ORDER — METHOCARBAMOL 1000 MG/10ML IJ SOLN: 500.0000 mg | Freq: Four times a day (QID) | INTRAMUSCULAR | Status: DC | PRN

## 2024-02-07 MED ORDER — LIDOCAINE-EPINEPHRINE 1 %-1:100000 IJ SOLN
INTRAMUSCULAR | Status: AC
  Filled 2024-02-07: qty 1

## 2024-02-07 MED ORDER — POTASSIUM CHLORIDE CRYS ER 10 MEQ PO TBCR: 10.0000 meq | EXTENDED_RELEASE_TABLET | Freq: Every day | ORAL | Status: DC

## 2024-02-07 MED ORDER — LIDOCAINE 2% (20 MG/ML) 5 ML SYRINGE
INTRAMUSCULAR | Status: DC | PRN
  Administered 2024-02-07: 100 mg via INTRAVENOUS

## 2024-02-07 MED ORDER — CHLORHEXIDINE GLUCONATE CLOTH 2 % EX PADS
6.0000 | MEDICATED_PAD | Freq: Once | CUTANEOUS | Status: AC
  Administered 2024-02-07: 6 via TOPICAL

## 2024-02-07 MED ORDER — DOCUSATE SODIUM 100 MG PO CAPS
100.0000 mg | ORAL_CAPSULE | Freq: Two times a day (BID) | ORAL | Status: DC
  Administered 2024-02-07: 100 mg via ORAL
  Filled 2024-02-07: qty 1

## 2024-02-07 MED ORDER — POLYETHYLENE GLYCOL 3350 17 G PO PACK: 17.0000 g | PACK | Freq: Every day | ORAL | Status: DC | PRN

## 2024-02-07 MED ORDER — THROMBIN 5000 UNITS EX KIT
PACK | CUTANEOUS | Status: AC
  Filled 2024-02-07: qty 1

## 2024-02-07 MED ORDER — EPHEDRINE SULFATE-NACL 50-0.9 MG/10ML-% IV SOSY
PREFILLED_SYRINGE | INTRAVENOUS | Status: DC | PRN
  Administered 2024-02-07 (×3): 5 mg via INTRAVENOUS
  Administered 2024-02-07: 10 mg via INTRAVENOUS

## 2024-02-07 MED ORDER — CLONAZEPAM 1 MG PO TABS
1.0000 mg | ORAL_TABLET | Freq: Two times a day (BID) | ORAL | Status: DC
  Administered 2024-02-07: 1 mg via ORAL
  Filled 2024-02-07: qty 1

## 2024-02-07 MED ORDER — LIDOCAINE-EPINEPHRINE 1 %-1:100000 IJ SOLN
INTRAMUSCULAR | Status: DC | PRN
  Administered 2024-02-07: 5 mL

## 2024-02-07 MED ORDER — SODIUM CHLORIDE 0.9% FLUSH: 3.0000 mL | INTRAVENOUS | Status: DC | PRN

## 2024-02-07 MED ORDER — PROPOFOL 10 MG/ML IV BOLUS
INTRAVENOUS | Status: DC | PRN
  Administered 2024-02-07: 110 mg via INTRAVENOUS

## 2024-02-07 MED ORDER — THROMBIN 5000 UNITS EX SOLR
OROMUCOSAL | Status: DC | PRN
  Administered 2024-02-07: 5 mL via TOPICAL

## 2024-02-07 MED ORDER — BISACODYL 10 MG RE SUPP: 10.0000 mg | Freq: Every day | RECTAL | Status: DC | PRN

## 2024-02-07 MED ORDER — PROPOFOL 10 MG/ML IV BOLUS
INTRAVENOUS | Status: AC
  Filled 2024-02-07: qty 20

## 2024-02-07 MED ORDER — PHENOL 1.4 % MT LIQD: 1.0000 | OROMUCOSAL | Status: DC | PRN

## 2024-02-07 MED ORDER — ALBUTEROL SULFATE (2.5 MG/3ML) 0.083% IN NEBU: 3.0000 mL | INHALATION_SOLUTION | RESPIRATORY_TRACT | Status: DC | PRN

## 2024-02-07 MED ORDER — SUGAMMADEX SODIUM 200 MG/2ML IV SOLN
INTRAVENOUS | Status: DC | PRN
  Administered 2024-02-07: 200 mg via INTRAVENOUS

## 2024-02-07 MED ORDER — HYDROMORPHONE HCL 1 MG/ML IJ SOLN: 0.5000 mg | INTRAMUSCULAR | Status: DC | PRN

## 2024-02-07 MED ORDER — ROCURONIUM BROMIDE 10 MG/ML (PF) SYRINGE
PREFILLED_SYRINGE | INTRAVENOUS | Status: DC | PRN
  Administered 2024-02-07: 20 mg via INTRAVENOUS
  Administered 2024-02-07: 50 mg via INTRAVENOUS

## 2024-02-07 MED ORDER — FENTANYL CITRATE (PF) 100 MCG/2ML IJ SOLN
25.0000 ug | INTRAMUSCULAR | Status: DC | PRN
  Administered 2024-02-07: 25 ug via INTRAVENOUS

## 2024-02-07 MED ORDER — METOPROLOL SUCCINATE ER 25 MG PO TB24: 12.5000 mg | ORAL_TABLET | Freq: Every day | ORAL | Status: DC

## 2024-02-07 MED ORDER — ONDANSETRON HCL 4 MG PO TABS: 4.0000 mg | ORAL_TABLET | Freq: Four times a day (QID) | ORAL | Status: DC | PRN

## 2024-02-07 MED ORDER — ACETAMINOPHEN 650 MG RE SUPP: 650.0000 mg | RECTAL | Status: DC | PRN

## 2024-02-07 MED ORDER — DEXAMETHASONE SOD PHOSPHATE PF 10 MG/ML IJ SOLN
INTRAMUSCULAR | Status: DC | PRN
  Administered 2024-02-07: 10 mg via INTRAVENOUS

## 2024-02-07 MED ORDER — ORAL CARE MOUTH RINSE: 15.0000 mL | Freq: Once | OROMUCOSAL | Status: AC

## 2024-02-07 MED ORDER — POTASSIUM 99 MG PO TABS: 99.0000 mg | ORAL_TABLET | Freq: Every day | ORAL | Status: DC

## 2024-02-07 MED ORDER — FLUOXETINE HCL 20 MG PO CAPS
20.0000 mg | ORAL_CAPSULE | Freq: Every day | ORAL | Status: DC
  Administered 2024-02-08: 20 mg via ORAL
  Filled 2024-02-07: qty 1

## 2024-02-07 MED ORDER — FLEET ENEMA RE ENEM: 1.0000 | ENEMA | Freq: Once | RECTAL | Status: DC | PRN

## 2024-02-07 MED ORDER — DROPERIDOL 2.5 MG/ML IJ SOLN: 0.6250 mg | Freq: Once | INTRAMUSCULAR | Status: DC | PRN

## 2024-02-07 MED ORDER — METHOCARBAMOL 500 MG PO TABS
500.0000 mg | ORAL_TABLET | Freq: Four times a day (QID) | ORAL | Status: DC | PRN
  Administered 2024-02-07 – 2024-02-08 (×2): 500 mg via ORAL
  Filled 2024-02-07 (×2): qty 1

## 2024-02-07 MED ORDER — 0.9 % SODIUM CHLORIDE (POUR BTL) OPTIME
TOPICAL | Status: DC | PRN
  Administered 2024-02-07: 1000 mL

## 2024-02-07 MED ORDER — BUDESON-GLYCOPYRROL-FORMOTEROL 160-9-4.8 MCG/ACT IN AERO
2.0000 | INHALATION_SPRAY | Freq: Two times a day (BID) | RESPIRATORY_TRACT | Status: DC
  Administered 2024-02-07: 2 via RESPIRATORY_TRACT
  Filled 2024-02-07: qty 5.9

## 2024-02-07 MED ORDER — CEFAZOLIN SODIUM-DEXTROSE 2-4 GM/100ML-% IV SOLN
2.0000 g | Freq: Three times a day (TID) | INTRAVENOUS | Status: AC
  Administered 2024-02-07 – 2024-02-08 (×2): 2 g via INTRAVENOUS
  Filled 2024-02-07 (×2): qty 100

## 2024-02-07 MED ORDER — CHLORHEXIDINE GLUCONATE 0.12 % MT SOLN
15.0000 mL | Freq: Once | OROMUCOSAL | Status: AC
  Administered 2024-02-07: 15 mL via OROMUCOSAL
  Filled 2024-02-07: qty 15

## 2024-02-07 MED ORDER — FENTANYL CITRATE (PF) 100 MCG/2ML IJ SOLN
INTRAMUSCULAR | Status: AC
  Filled 2024-02-07: qty 2

## 2024-02-07 MED ORDER — LACTATED RINGERS IV SOLN: INTRAVENOUS | Status: DC

## 2024-02-07 MED ORDER — ONDANSETRON HCL 4 MG/2ML IJ SOLN: 4.0000 mg | Freq: Four times a day (QID) | INTRAMUSCULAR | Status: DC | PRN

## 2024-02-07 MED ORDER — FLUOXETINE HCL 20 MG PO CAPS: 20.0000 mg | ORAL_CAPSULE | ORAL | Status: DC

## 2024-02-07 MED ORDER — FUROSEMIDE 20 MG PO TABS: 20.0000 mg | ORAL_TABLET | Freq: Every day | ORAL | Status: DC

## 2024-02-07 MED ORDER — CHLORHEXIDINE GLUCONATE CLOTH 2 % EX PADS: 6.0000 | MEDICATED_PAD | Freq: Once | CUTANEOUS | Status: DC

## 2024-02-07 MED ORDER — OXYCODONE HCL 5 MG PO TABS: 5.0000 mg | ORAL_TABLET | Freq: Once | ORAL | Status: DC | PRN

## 2024-02-07 MED ORDER — ONDANSETRON HCL 4 MG/2ML IJ SOLN
INTRAMUSCULAR | Status: DC | PRN
  Administered 2024-02-07: 4 mg via INTRAVENOUS

## 2024-02-07 SURGICAL SUPPLY — 40 items
BAG COUNTER SPONGE SURGICOUNT (BAG) ×1 IMPLANT
BAND RUBBER 3X1/6 STRL (MISCELLANEOUS) ×2 IMPLANT
BLADE CLIPPER SURG (BLADE) IMPLANT
BUR ACORN 6.0 (BURR) IMPLANT
BUR MATCHSTICK NEURO 3.0 LAGG (BURR) ×1 IMPLANT
CANISTER SUCTION 3000ML PPV (SUCTIONS) ×1 IMPLANT
DERMABOND ADVANCED .7 DNX12 (GAUZE/BANDAGES/DRESSINGS) ×1 IMPLANT
DEVICE DISSECT PLASMABLAD 3.0S (MISCELLANEOUS) ×1 IMPLANT
DRAPE HALF SHEET 40X57 (DRAPES) IMPLANT
DRAPE LAPAROTOMY T 102X78X121 (DRAPES) ×1 IMPLANT
DRAPE MICROSCOPE LEICA (MISCELLANEOUS) IMPLANT
DRSG OPSITE POSTOP 4X6 (GAUZE/BANDAGES/DRESSINGS) IMPLANT
DURAPREP 26ML APPLICATOR (WOUND CARE) ×1 IMPLANT
ELECTRODE REM PT RTRN 9FT ADLT (ELECTROSURGICAL) ×1 IMPLANT
GAUZE 4X4 16PLY ~~LOC~~+RFID DBL (SPONGE) IMPLANT
GAUZE SPONGE 4X4 12PLY STRL (GAUZE/BANDAGES/DRESSINGS) ×1 IMPLANT
GLOVE BIOGEL PI IND STRL 8.5 (GLOVE) ×1 IMPLANT
GLOVE BIOGEL PI MICRO 8.5 (GLOVE) ×1 IMPLANT
GOWN STRL REUS W/ TWL LRG LVL3 (GOWN DISPOSABLE) IMPLANT
GOWN STRL REUS W/ TWL XL LVL3 (GOWN DISPOSABLE) IMPLANT
GOWN STRL REUS W/TWL 2XL LVL3 (GOWN DISPOSABLE) ×1 IMPLANT
HEMOSTAT POWDER KIT SURGIFOAM (HEMOSTASIS) ×1 IMPLANT
KIT BASIN OR (CUSTOM PROCEDURE TRAY) ×1 IMPLANT
KIT TURNOVER KIT B (KITS) ×1 IMPLANT
NDL HYPO 22X1.5 SAFETY MO (MISCELLANEOUS) ×1 IMPLANT
NDL SPNL 20GX3.5 QUINCKE YW (NEEDLE) IMPLANT
PACK LAMINECTOMY NEURO (CUSTOM PROCEDURE TRAY) ×1 IMPLANT
PAD ARMBOARD POSITIONER FOAM (MISCELLANEOUS) ×3 IMPLANT
PATTIES SURGICAL .5 X1 (DISPOSABLE) ×1 IMPLANT
SOLN 0.9% NACL POUR BTL 1000ML (IV SOLUTION) ×1 IMPLANT
SOLN STERILE WATER BTL 1000 ML (IV SOLUTION) ×1 IMPLANT
SPIKE FLUID TRANSFER (MISCELLANEOUS) ×1 IMPLANT
SPONGE SURGIFOAM ABS GEL SZ50 (HEMOSTASIS) IMPLANT
SUT VIC AB 1 CT1 18XBRD ANBCTR (SUTURE) ×1 IMPLANT
SUT VIC AB 2-0 CP2 18 (SUTURE) ×1 IMPLANT
SUT VIC AB 3-0 SH 8-18 (SUTURE) ×1 IMPLANT
SUT VIC AB 4-0 RB1 18 (SUTURE) ×1 IMPLANT
TOWEL GREEN STERILE (TOWEL DISPOSABLE) ×1 IMPLANT
TOWEL GREEN STERILE FF (TOWEL DISPOSABLE) ×1 IMPLANT
TUBING FEATHERFLOW (TUBING) ×1 IMPLANT

## 2024-02-07 NOTE — Transfer of Care (Signed)
 Immediate Anesthesia Transfer of Care Note  Patient: Judy White  Procedure(s) Performed: LUMBAR SUBLAMINAR /DECOMPRESSION LUMBAR THREE-LUMBAR FOUR (Back)  Patient Location: PACU  Anesthesia Type:General  Level of Consciousness: awake and alert   Airway & Oxygen  Therapy: Patient Spontanous Breathing and Patient connected to nasal cannula oxygen   Post-op Assessment: Report given to RN and Post -op Vital signs reviewed and stable  Post vital signs: Reviewed and stable  Last Vitals:  Vitals Value Taken Time  BP 113/48 02/07/24 15:30  Temp 36.5 C 02/07/24 15:20  Pulse 69 02/07/24 15:32  Resp 17 02/07/24 15:32  SpO2 93 % 02/07/24 15:32  Vitals shown include unfiled device data.  Last Pain:  Vitals:   02/07/24 1520  TempSrc:   PainSc: Asleep         Complications: No notable events documented.

## 2024-02-07 NOTE — Anesthesia Procedure Notes (Addendum)
 Procedure Name: Intubation Date/Time: 02/07/2024 1:58 PM  Performed by: Jolynn Mage, CRNAPre-anesthesia Checklist: Patient identified, Patient being monitored, Timeout performed, Emergency Drugs available and Suction available Patient Re-evaluated:Patient Re-evaluated prior to induction Oxygen  Delivery Method: Circle System Utilized Preoxygenation: Pre-oxygenation with 100% oxygen  Induction Type: IV induction Ventilation: Mask ventilation without difficulty Laryngoscope Size: Miller and 2 Grade View: Grade I Tube type: Oral Tube size: 7.0 mm Number of attempts: 1 Airway Equipment and Method: Stylet Placement Confirmation: ETT inserted through vocal cords under direct vision, positive ETCO2 and breath sounds checked- equal and bilateral Secured at: 21 cm Tube secured with: Tape Dental Injury: Teeth and Oropharynx as per pre-operative assessment

## 2024-02-07 NOTE — Op Note (Signed)
 Date of surgery: 02/07/2024 Preoperative diagnosis: Lumbar spinal stenosis L3-L4 with neurogenic claudication, lumbar radiculopathy Postoperative diagnosis: Same Procedure laminectomy L3-L4 with decompression of the central spinal canal Surgeon: Victory Gens Anesthesia: General Tracheal Indications: Judy White is an 83 year old individual who previously underwent decompression and fusion at L4-L5 as she had done well for a number of years but recently has had increasing symptoms of neurogenic claudication lumbar radiculopathy and increasing back pain.  Recent MRI demonstrates that she has evolved significant stenosis at the adjacent level above her fusion.  She is advised regarding surgery to involve a simple decompression at the level of L3-L4.  Procedure: The patient was brought to the operating room supine on the stretcher.  After the smooth induction of general endotracheal anesthesia she was carefully turned prone.  The bony prominences were appropriately padded and protected.  Back was prepped with alcohol DuraPrep and draped in a sterile fashion.  Midline incision was made in the area of her previous incision and extended cephalad dissection was taken down to the lumbodorsal fascia which was opened on the right side of the midline.  Subperiosteal dissection was performed to expose the lamina of L3-4 and the superior portion of the hardware.  A confirming radiograph identified this as the L3-L4 level.  Then the laminotomy was increased in size removing the inferior margin lamina of L3 out to and including up partial facetectomy at the L3-L4 level.  The thickened redundant yellow ligament was identified in this region.  This was taken up with a 2 and a 3 mm Kerrison punch.  This allowed for good decompression of the central canal and the contralateral portion of the canal was decompressed through this aperture.  There was substantial redundant yellow ligament in this region creating a good portion of the  compression.  Once this was removed the central canal and lateral recesses appeared to open up nicely and a curved Kerrison punch was used to facilitate contralateral decompression on the left side.  Ultimately the common dural to the L4 nerve root inferiorly the L3 nerve root superiorly could be sounded and found to be free and clear once this was achieved hemostasis was checked and the epidural spaces and retractor was then removed and 25 cc of half percent Marcaine  was injected into the paraspinous fascia.  The lumbodorsal fascia was then closed with #1 Vicryl in interrupted fashion 2-0 Vicryl was used in the subcutaneous tissues 3-0 and 4-0 Vicryl in the subcuticular skin Dermabond was placed on the skin.  Blood loss for the procedure was estimated at 25 cc.

## 2024-02-07 NOTE — H&P (Signed)
 Judy White is an 83 y.o. female.   Chief Complaint: Back and bilateral leg pain HPI: Patient is an 83 year old individual whose had significant problems with back pain bilateral leg pain and weakness.  She has been evaluated with an MRI which demonstrates a high-grade stenosis at the level of L4-L5 with a fixed spondylolisthesis.  She has been advised regarding the need for surgical decompression without fusion at the L4-L5 level.  Past Medical History:  Diagnosis Date   Agoraphobia    Anxiety    Arthritis    normal for the age; nothing gives me problems (01/24/2016)   COPD (chronic obstructive pulmonary disease) (HCC)    mild (01/24/2016)   Depressive disorder, not elsewhere classified    DVT (deep venous thrombosis) (HCC) 10/08/2015   they said the blood clot went from one of my legs to my lung   Dysrhythmia    hx a-fib - had ablation at [facility], no current problems   GERD (gastroesophageal reflux disease)    diet control, no meds   History of hiatal hernia    small one (01/24/2016)   Hypercholesteremia    Hypertension    on metoprolol    Myalgia and myositis, unspecified    Osteopenia    Overactive bladder    Persistent atrial fibrillation (HCC)    a. s/p Watchman implant 12/2014   Presence of Watchman left atrial appendage closure device    Pulmonary embolism (HCC) 10/08/2015   SDH (subdural hematoma) (HCC) 11/12/2005   in setting of warfarin   Sleep apnea    USES CPAP NIGHTLY    Past Surgical History:  Procedure Laterality Date   APPENDECTOMY  1983   ATRIAL FIBRILLATION ABLATION  06/28/2017   PER PATIENT SUCCESSFUL ABLATION , [facility] , LOV JANUARY 2020   BRONCHOSCOPY  01/24/2016   CARDIAC CATHETERIZATION  06/28/2017   in [place]   CATARACT EXTRACTION W/ INTRAOCULAR LENS  IMPLANT, BILATERAL Bilateral    CESAREAN SECTION  1967   COLONOSCOPY     x several   I & D KNEE WITH POLY EXCHANGE Right 03/16/2023   Procedure: IRRIGATION AND DEBRIDEMENT  KNEE WITH POLY EXCHANGE;  Surgeon: Vernetta Lonni GRADE, MD;  Location: [facility] ORS;  Service: Orthopedics;  Laterality: Right;   LEFT ATRIAL APPENDAGE OCCLUSION N/A 12/31/2014   Procedure: LEFT ATRIAL APPENDAGE OCCLUSION;  Surgeon: Lynwood Rakers, MD;  Location: [facility] INVASIVE CV LAB;  Service: Cardiovascular;  Laterality: N/A;   POSTERIOR FUSION LUMBAR SPINE Bilateral 07/2014   SHOULDER ARTHROSCOPY W/ ROTATOR CUFF REPAIR Left    SHOULDER SURGERY Right    tendon broke; couldn't be repaired   TEE WITHOUT CARDIOVERSION N/A 12/22/2014   Procedure: TRANSESOPHAGEAL ECHOCARDIOGRAM (TEE);  Surgeon: Vinie JAYSON Maxcy, MD;  Location: [facility] ENDOSCOPY;  Service: Cardiovascular;  Laterality: N/A;   TEE WITHOUT CARDIOVERSION N/A 02/16/2015   post Watchman TEE with good seal and no leak around device    TONSILLECTOMY     TOOTH EXTRACTION  05/2018   TOTAL ABDOMINAL HYSTERECTOMY     TOTAL KNEE ARTHROPLASTY Right 12/27/2018   Procedure: RIGHT TOTAL KNEE ARTHROPLASTY;  Surgeon: Vernetta Lonni GRADE, MD;  Location: [facility] ORS;  Service: Orthopedics;  Laterality: Right;   VIDEO BRONCHOSCOPY Bilateral 01/24/2016   Procedure: VIDEO BRONCHOSCOPY WITH FLUORO;  Surgeon: Lamar GORMAN Chris, MD;  Location: [facility] ENDOSCOPY;  Service: Cardiopulmonary;  Laterality: Bilateral;   WISDOM TOOTH EXTRACTION      Family History  Problem Relation Age of Onset  Aneurysm Father    Hypotension Mother    Neuropathy Brother    Heart disease Brother    Cancer Other        maternal side   Heart Problems Other        paternal side   Heart attack Neg Hx    Social History:  reports that she quit smoking about 8 years ago. Her smoking use included cigarettes. She started smoking about 68 years ago. She has a 30 pack-year smoking history. She has never used smokeless tobacco. She reports that she does not currently use alcohol. She reports that she does not use drugs.  Allergies: Allergies[1]  Medications Prior to Admission  Medication Sig  Dispense Refill   acetaminophen  (TYLENOL ) 500 MG tablet Take 500-1,000 mg by mouth every 6 (six) hours as needed for moderate pain (pain score 4-6) or headache.     albuterol  (VENTOLIN  HFA) 108 (90 Base) MCG/ACT inhaler Inhale 2 puffs into the lungs every 4 (four) hours as needed for wheezing or shortness of breath. 8 g 3   aspirin  81 MG chewable tablet Chew 81 mg by mouth every Monday, Wednesday, and Friday.     budesonide -glycopyrrolate -formoterol  (BREZTRI  AEROSPHERE) 160-9-4.8 MCG/ACT AERO inhaler Inhale 2 puffs into the lungs 2 (two) times daily. 10.7 g 3   Calcium -Vitamin D  (CALTRATE 600 PLUS-VIT D PO) Take 1 tablet by mouth daily.      Cholecalciferol  (VITAMIN D3) 2000 units TABS Take 2,000 Units by mouth daily.     clonazePAM  (KLONOPIN ) 1 MG tablet Take 1.5 tablets (1.5 mg total) by mouth in the morning. May also take 0.5 tablets (0.5 mg total) daily as needed. 60 tablet 5   cyanocobalamin  (VITAMIN B12) 1000 MCG tablet Take 1,000 mcg by mouth daily.     FLUoxetine  (PROZAC ) 20 MG capsule Take 1 capsule (20 mg total) by mouth daily with breakfast. 90 capsule 2   furosemide  (LASIX ) 20 MG tablet Take 1 tablet (20 mg total) by mouth daily. 90 tablet 0   metoprolol  succinate (TOPROL -XL) 25 MG 24 hr tablet Take 0.5 tablets (12.5 mg total) by mouth daily. 45 tablet 3   Multiple Vitamins-Minerals (CENTRUM SILVER ADULT 50+) TABS Take 1 tablet by mouth daily.     Multiple Vitamins-Minerals (PRESERVISION AREDS PO) Take 1 each by mouth daily.     oxybutynin  (DITROPAN -XL) 10 MG 24 hr tablet Take 1 tablet (10 mg total) by mouth daily. 90 tablet 3   Potassium 99 MG TABS Take 99 mg by mouth daily.     vitamin C  (ASCORBIC ACID ) 500 MG tablet Take 500 mg by mouth daily.     vitamin E  180 MG (400 UNITS) capsule Take 400 Units daily by mouth.     clonazePAM  (KLONOPIN ) 1 MG tablet Take 1 tablet (1 mg total) by mouth 2 (two) times daily. (Patient not taking: Reported on 02/04/2024) 60 tablet 3   clonazePAM   (KLONOPIN ) 1 MG tablet Take 1 tablet (1 mg total) by mouth 2 (two) times daily. (Patient not taking: Reported on 02/04/2024) 60 tablet 5   clonazePAM  (KLONOPIN ) 1 MG tablet Take 1 tablet (1 mg total) by mouth 2 (two) times daily. (Patient not taking: Reported on 02/04/2024) 60 tablet 5   Evolocumab  (REPATHA ) 140 MG/ML SOSY Inject 140 mg into the skin every 14 (fourteen) days. (Patient not taking: Reported on 02/04/2024) 2 mL 1   FLUoxetine  (PROZAC ) 20 MG capsule Take 1 capsule (20 mg total) by mouth every morning with food. (Patient not  taking: Reported on 02/04/2024) 90 capsule 2   FLUoxetine  (PROZAC ) 20 MG capsule Take 1 capsule (20 mg total) by mouth every morning with food. (Patient not taking: Reported on 02/04/2024) 90 capsule 2   NON FORMULARY       No results found for this or any previous visit (from the past 48 hours). No results found.  Review of Systems  Constitutional:  Positive for activity change.  Musculoskeletal:  Positive for back pain and gait problem.  Neurological:  Positive for weakness and numbness.  All other systems reviewed and are negative.   Blood pressure 105/74, pulse 94, temperature 98.5 F (36.9 C), temperature source Oral, resp. rate 18, height 5' 6 (1.676 m), weight 88.5 kg, SpO2 94%. Physical Exam Constitutional:      Appearance: Normal appearance.  HENT:     Head: Normocephalic and atraumatic.     Right Ear: Tympanic membrane, ear canal and external ear normal.     Left Ear: Tympanic membrane, ear canal and external ear normal.     Nose: Nose normal.     Mouth/Throat:     Mouth: Mucous membranes are moist.     Pharynx: Oropharynx is clear.  Eyes:     Extraocular Movements: Extraocular movements intact.     Conjunctiva/sclera: Conjunctivae normal.     Pupils: Pupils are equal, round, and reactive to light.  Cardiovascular:     Rate and Rhythm: Normal rate and regular rhythm.     Pulses: Normal pulses.     Heart sounds: Normal heart sounds.   Pulmonary:     Effort: Pulmonary effort is normal.     Breath sounds: Normal breath sounds.  Abdominal:     General: Abdomen is flat. Bowel sounds are normal.     Palpations: Abdomen is soft.  Musculoskeletal:        General: Normal range of motion.     Cervical back: Normal range of motion.  Skin:    General: Skin is warm and dry.  Neurological:     General: No focal deficit present.     Mental Status: She is alert and oriented to person, place, and time. Mental status is at baseline.     Comments: Mild weakness in both tibialis anterior  Psychiatric:        Mood and Affect: Mood normal.        Behavior: Behavior normal.        Thought Content: Thought content normal.        Judgment: Judgment normal.      Assessment/Plan Spondylolisthesis and stenosis L4-L5.  Plan: Bilateral decompression L4-L5.  Victory JINNY Gens, MD 02/07/2024, 1:41 PM       [1]  Allergies Allergen Reactions   Rosuvastatin      Myalgias on 5mg  every other day   Statins     Myalgias   Bupropion Anxiety

## 2024-02-07 NOTE — Anesthesia Postprocedure Evaluation (Signed)
 Anesthesia Post Note  Patient: Judy White  Procedure(s) Performed: LUMBAR SUBLAMINAR /DECOMPRESSION LUMBAR THREE-LUMBAR FOUR (Back)     Patient location during evaluation: PACU Anesthesia Type: General Level of consciousness: awake and alert Pain management: pain level controlled Vital Signs Assessment: post-procedure vital signs reviewed and stable Respiratory status: spontaneous breathing, nonlabored ventilation, respiratory function stable and patient connected to nasal cannula oxygen  Cardiovascular status: blood pressure returned to baseline and stable Postop Assessment: no apparent nausea or vomiting Anesthetic complications: no   No notable events documented.  Last Vitals:  Vitals:   02/07/24 1600 02/07/24 1630  BP: 97/85 133/72  Pulse: (!) 59 64  Resp: (!) 8 20  Temp: 36.5 C   SpO2: 93% 92%    Last Pain:  Vitals:   02/07/24 1550  TempSrc:   PainSc: 4    Pain Goal:                   Rome Ade

## 2024-02-08 ENCOUNTER — Other Ambulatory Visit (HOSPITAL_COMMUNITY): Payer: Self-pay

## 2024-02-08 ENCOUNTER — Encounter (HOSPITAL_COMMUNITY): Payer: Self-pay | Admitting: Neurological Surgery

## 2024-02-08 MED ORDER — METHOCARBAMOL 500 MG PO TABS
500.0000 mg | ORAL_TABLET | Freq: Four times a day (QID) | ORAL | 3 refills | Status: AC | PRN
  Filled 2024-02-08 – 2024-02-22 (×5): qty 30, 8d supply, fill #0
  Filled 2024-03-14: qty 30, 8d supply, fill #1

## 2024-02-08 MED ORDER — DEXAMETHASONE 0.5 MG PO TABS
ORAL_TABLET | ORAL | 0 refills | Status: AC
  Filled 2024-02-08: qty 13, fill #0
  Filled 2024-02-08 (×2): qty 28, 6d supply, fill #0
  Filled 2024-02-08: qty 13, 6d supply, fill #0

## 2024-02-08 MED ORDER — OXYCODONE-ACETAMINOPHEN 5-325 MG PO TABS
1.0000 | ORAL_TABLET | ORAL | 0 refills | Status: DC | PRN
  Filled 2024-02-08 (×2): qty 30, 5d supply, fill #0

## 2024-02-08 NOTE — Plan of Care (Signed)
 Pt doing well. Pt and son given D/C instructions with verbal understanding. Rx's were sent to the pharmacy by MD. Pt's incision is clean and dry with no sign of infection. Pt's IV was removed prior to D/C. Pt D/C'd home via wheelchair per MD order. Pt is stable @ D/C and has no other needs at this time. Rema Fendt, RN

## 2024-02-08 NOTE — Care Management Obs Status (Signed)
 MEDICARE OBSERVATION STATUS NOTIFICATION   Patient Details  Name: Judy White MRN: [id number] Date of Birth: [birth year]   Medicare Observation Status Notification Given:  Yes    Jennie Laneta Dragon 02/08/2024, 10:42 AM

## 2024-02-08 NOTE — Evaluation (Signed)
 Occupational Therapy Evaluation Patient Details Name: Judy White MRN: [id number] DOB: [birth year] Today's Date: 02/08/2024   History of Present Illness   Judy White is an 83 y/o admitted for planned laminectomy L3-L4 with decompression of the central spinal canal due to back pain and BLE pain/weakness. Previously underwent decompression and fusion at L4-L5. PMH includes Agoraphobia, Anxiety, Arthritis, COPD, Depressive disorder, Dysrhythmia, hiatal hernia, HTN, Myalgia and myositis, Osteopenia, Overactive bladder, Persistent atrial fibrillation, Presence of Watchman left atrial appendage closure device, Pulmonary embolism (10/08/2015), SDH (11/12/2005), Posterior fusion lumbar spine (Bilateral, 07/2014); Shoulder arthroscopy w/ rotator cuff repair (Left); Shoulder surgery (Right); Cardiac catheterization (06/28/2017); Bronchoscopy (01/24/2016); ABLATION (06/28/2017); Total knee arthroplasty (Right, 12/27/2018); I & D knee with poly exchange (Right, 03/16/2023)     Clinical Impressions Pt is typically independent with DME (RW vs Rollator vs hurry cane) for mobility, shower chair for bathing with grab bars for safety. Pt educated on back precautions post-op (no brace needed) Today she is supervision for ADL - mod A for LB ADL as Pt is currently unable to achieve figure 4. Education provided for AE (grabber/reacher, long handle sponge, sock donner) demonstration, practice and teach back. Pt educated on compensatory strategies for back precautions with focus on ADL. Pt/son with no questions at end of session, At this time recommending HHOT post-acute to maximize safety and independence in ADL and functional transfers and maintain back precautions.      If plan is discharge home, recommend the following:   A little help with walking and/or transfers;A lot of help with bathing/dressing/bathroom;Assistance with cooking/housework;Assist for transportation;Help with stairs or ramp for entrance      Functional Status Assessment   Patient has had a recent decline in their functional status and demonstrates the ability to make significant improvements in function in a reasonable and predictable amount of time.     Equipment Recommendations   None recommended by OT     Recommendations for Other Services   PT consult     Precautions/Restrictions   Precautions Precautions: Back Precaution Booklet Issued: Yes (comment) Recall of Precautions/Restrictions: Impaired Precaution/Restrictions Comments: mod cues to maintain during functional tasks Required Braces or Orthoses:  (no brace per orders) Restrictions Weight Bearing Restrictions Per Provider Order: No     Mobility Bed Mobility Overal bed mobility: Needs Assistance Bed Mobility: Rolling, Sidelying to Sit Rolling: Min assist, Used rails Sidelying to sit: Min assist       General bed mobility comments: educated in log roll technique and practiced from flat bed    Transfers Overall transfer level: Needs assistance Equipment used: Straight cane, None (hurry cane) Transfers: Sit to/from Stand Sit to Stand: Supervision           General transfer comment: extra time, no physical assist      Balance Overall balance assessment: Mild deficits observed, not formally tested                                         ADL either performed or assessed with clinical judgement   ADL Overall ADL's : Needs assistance/impaired Eating/Feeding: Independent   Grooming: Supervision/safety;Cueing for safety;Standing Grooming Details (indicate cue type and reason): cues to maintain back precautions and education for compensatory strategies like cup method for oral care Upper Body Bathing: Moderate assistance Upper Body Bathing Details (indicate cue type and reason): educated on long handle sponge Lower Body Bathing: Moderate  assistance Lower Body Bathing Details (indicate cue type and reason): educated  on long handle sponge Upper Body Dressing : Set up;Sitting   Lower Body Dressing: Minimal assistance;Sit to/from stand Lower Body Dressing Details (indicate cue type and reason): Pt unable to get to feet without bending. provided grabber/reacher and education for compensatory strategies Toilet Transfer: Supervision/safety;Ambulation (SPC hurry cane)   Toileting- Clothing Manipulation and Hygiene: Contact guard assist;Sit to/from stand Toileting - Clothing Manipulation Details (indicate cue type and reason): front and back peri care     Functional mobility during ADLs: Contact guard assist;Cane General ADL Comments: provided with grabber/reacher, and sock donner     Vision Baseline Vision/History: 1 Wears glasses Ability to See in Adequate Light: 0 Adequate Patient Visual Report: No change from baseline Vision Assessment?: No apparent visual deficits     Perception         Praxis         Pertinent Vitals/Pain Pain Assessment Pain Assessment: Faces Faces Pain Scale: Hurts little more Pain Location: back at incision Pain Descriptors / Indicators: Discomfort, Sore Pain Intervention(s): Limited activity within patient's tolerance, Monitored during session, Repositioned     Extremity/Trunk Assessment Upper Extremity Assessment Upper Extremity Assessment: Overall WFL for tasks assessed;Right hand dominant (brace on R wrist, it's just a little tendonitis)   Lower Extremity Assessment Lower Extremity Assessment: Defer to PT evaluation   Cervical / Trunk Assessment Cervical / Trunk Assessment: Back Surgery   Communication Communication Communication: No apparent difficulties   Cognition Arousal: Alert   Cognition: Cognition impaired     Awareness: Intellectual awareness intact, Online awareness impaired Memory impairment (select all impairments): Short-term memory, Working memory Attention impairment (select first level of impairment): Selective attention Executive  functioning impairment (select all impairments): Problem solving OT - Cognition Comments: Pt VERY pleasant and cooperative, eager to learn - but struggles to maintain education and apply to functional situations.                 Following commands: Intact       Cueing  General Comments   Cueing Techniques: Verbal cues;Gestural cues  son present throughout and very supportive   Exercises     Shoulder Instructions      Home Living Family/patient expects to be discharged to:: Private residence Living Arrangements: Alone Available Help at Discharge: Family;Neighbor;Available 24 hours/day Type of Home: House Home Access: Stairs to enter [employer] of Steps: 2+1 Entrance Stairs-Rails: None Home Layout: One level     Bathroom Shower/Tub: Producer, Television/film/video: Handicapped height Bathroom Accessibility: Yes How Accessible: Accessible via walker Home Equipment: Agricultural Consultant (2 wheels);Rollator (4 wheels);Shower seat - built in;Grab bars - tub/shower;Hand held shower head;Lift chair;Cane - single point          Prior Functioning/Environment Prior Level of Function : Independent/Modified Independent             Mobility Comments: RW PRN ADLs Comments: struggled with compression socks PTA. but mostly independent    OT Problem List: Decreased range of motion;Decreased activity tolerance;Impaired balance (sitting and/or standing);Decreased safety awareness;Decreased knowledge of use of DME or AE;Decreased knowledge of precautions;Pain   OT Treatment/Interventions: Self-care/ADL training;Energy conservation;DME and/or AE instruction;Therapeutic activities;Patient/family education;Balance training      OT Goals(Current goals can be found in the care plan section)   Acute Rehab OT Goals Patient Stated Goal: get back to being independent and walking better OT Goal Formulation: With patient/family Time For Goal Achievement:  02/22/24 Potential to  Achieve Goals: Good ADL Goals Pt Will Perform Grooming: with modified independence;standing Pt Will Perform Lower Body Bathing: with modified independence;with adaptive equipment;sit to/from stand Pt Will Perform Lower Body Dressing: with modified independence;with adaptive equipment;sit to/from stand Pt Will Transfer to Toilet: with modified independence;ambulating Pt Will Perform Toileting - Clothing Manipulation and hygiene: with modified independence;sitting/lateral leans Pt Will Perform Tub/Shower Transfer: Shower transfer;with supervision;shower seat;grab bars   OT Frequency:  Min 2X/week    Co-evaluation              AM-PAC OT 6 Clicks Daily Activity     Outcome Measure Help from another person eating meals?: None Help from another person taking care of personal grooming?: A Little Help from another person toileting, which includes using toliet, bedpan, or urinal?: A Little Help from another person bathing (including washing, rinsing, drying)?: A Lot Help from another person to put on and taking off regular upper body clothing?: A Little Help from another person to put on and taking off regular lower body clothing?: A Lot 6 Click Score: 17   End of Session Equipment Utilized During Treatment:  (SPC hurry cane) Nurse Communication: Mobility status;Precautions  Activity Tolerance: Patient tolerated treatment well Patient left: in chair;with call bell/phone within reach;with family/visitor present  OT Visit Diagnosis: History of falling (Z91.81);Muscle weakness (generalized) (M62.81);Pain;Unsteadiness on feet (R26.81) Pain - Right/Left:  (central/lumbar) Pain - part of body:  (back)                Time: 9183-9156 OT Time Calculation (min): 27 min Charges:  OT General Charges $OT Visit: 1 Visit OT Evaluation $OT Eval Low Complexity: 1 Low  Leita DEL OTR/L Acute Rehabilitation Services Office: [phone]   Leita PARAS [facility] 02/08/2024,  9:50 AM

## 2024-02-08 NOTE — Discharge Summary (Signed)
 Physician Discharge Summary  Patient ID: Judy White MRN: [id number] DOB/AGE: 1941-01-27 83 y.o.  Admit date: 02/07/2024 Discharge date: 02/08/2024  Admission Diagnoses: Lumbar stenosis with radiculopathy and neurogenic claudication  Discharge Diagnoses: Lumbar stenosis.  Lumbar radiculopathy.  Neurogenic claudication.  History of fusion L4-L5. Principal Problem:   Lumbar stenosis with neurogenic claudication   Discharged Condition: good  Hospital Course: Patient tolerated surgery well.  Consults: None  Significant Diagnostic Studies: None  Treatments: surgery: See op note  Discharge Exam: Blood pressure (!) 105/50, pulse 62, temperature 98.3 F (36.8 C), temperature source Oral, resp. rate 19, height 5' 6 (1.676 m), weight 88.5 kg, SpO2 95%. Incision is clean and dry Station and gait are intact.  Disposition: Discharge disposition: 01-Home or Self Care       Discharge Instructions     Call MD for:  redness, tenderness, or signs of infection (pain, swelling, redness, odor or green/yellow discharge around incision site)   Complete by: As directed    Call MD for:  severe uncontrolled pain   Complete by: As directed    Call MD for:  temperature >100.4   Complete by: As directed    Discharge wound care:   Complete by: As directed    Okay to shower. Do not apply salves or appointments to incision. No heavy lifting with the upper extremities greater than 10 pounds. May resume driving when not requiring pain medication and patient feels comfortable with doing so.   Incentive spirometry RT   Complete by: As directed    Increase activity slowly   Complete by: As directed       Allergies as of 02/08/2024       Reactions   Rosuvastatin     Myalgias on 5mg  every other day   Statins    Myalgias   Bupropion Anxiety        Medication List     TAKE these medications    acetaminophen  500 MG tablet Commonly known as: TYLENOL  Take 500-1,000 mg by mouth every 6  (six) hours as needed for moderate pain (pain score 4-6) or headache.   albuterol  108 (90 Base) MCG/ACT inhaler Commonly known as: VENTOLIN  HFA Inhale 2 puffs into the lungs every 4 (four) hours as needed for wheezing or shortness of breath.   ascorbic acid  500 MG tablet Commonly known as: VITAMIN C  Take 500 mg by mouth daily.   aspirin  81 MG chewable tablet Chew 81 mg by mouth every Monday, Wednesday, and Friday.   Breztri  Aerosphere 160-9-4.8 MCG/ACT Aero inhaler Generic drug: budesonide -glycopyrrolate -formoterol  Inhale 2 puffs into the lungs 2 (two) times daily.   CALTRATE 600 PLUS-VIT D PO Take 1 tablet by mouth daily.   clonazePAM  1 MG tablet Commonly known as: KLONOPIN  Take 1.5 tablets (1.5 mg total) by mouth in the morning. May also take 0.5 tablets (0.5 mg total) daily as needed.   clonazePAM  1 MG tablet Commonly known as: KlonoPIN  Take 1 tablet (1 mg total) by mouth 2 (two) times daily.   clonazePAM  1 MG tablet Commonly known as: KlonoPIN  Take 1 tablet (1 mg total) by mouth 2 (two) times daily.   clonazePAM  1 MG tablet Commonly known as: KlonoPIN  Take 1 tablet (1 mg total) by mouth 2 (two) times daily.   cyanocobalamin  1000 MCG tablet Commonly known as: VITAMIN B12 Take 1,000 mcg by mouth daily.   dexamethasone  1 MG tablet Commonly known as: DECADRON  2 tablets twice daily for 2 days, one tablet twice daily for 2  days, one tablet daily for 2 days.   FLUoxetine  20 MG capsule Commonly known as: PROZAC  Take 1 capsule (20 mg total) by mouth daily with breakfast.   FLUoxetine  20 MG capsule Commonly known as: PROZAC  Take 1 capsule (20 mg total) by mouth every morning with food.   FLUoxetine  20 MG capsule Commonly known as: PROZAC  Take 1 capsule (20 mg total) by mouth every morning with food.   furosemide  20 MG tablet Commonly known as: LASIX  Take 1 tablet (20 mg total) by mouth daily.   methocarbamol  500 MG tablet Commonly known as: ROBAXIN  Take 1  tablet (500 mg total) by mouth every 6 (six) hours as needed for muscle spasms.   metoprolol  succinate 25 MG 24 hr tablet Commonly known as: TOPROL -XL Take 0.5 tablets (12.5 mg total) by mouth daily.   NON FORMULARY   oxybutynin  10 MG 24 hr tablet Commonly known as: DITROPAN -XL Take 1 tablet (10 mg total) by mouth daily.   oxyCODONE -acetaminophen  5-325 MG tablet Commonly known as: PERCOCET/ROXICET Take 1 tablet by mouth every 4 (four) hours as needed for moderate pain (pain score 4-6) or severe pain (pain score 7-10).   Potassium 99 MG Tabs Take 99 mg by mouth daily.   PRESERVISION AREDS PO Take 1 each by mouth daily.   Centrum Silver Adult 50+ Tabs Take 1 tablet by mouth daily.   Repatha  140 MG/ML Sosy Generic drug: Evolocumab  Inject 140 mg into the skin every 14 (fourteen) days.   Vitamin D3 50 MCG (2000 UT) Tabs Take 2,000 Units by mouth daily.   vitamin E  180 MG (400 UNITS) capsule Take 400 Units daily by mouth.               Discharge Care Instructions  (From admission, onward)           Start     Ordered   02/08/24 0000  Discharge wound care:       Comments: Okay to shower. Do not apply salves or appointments to incision. No heavy lifting with the upper extremities greater than 10 pounds. May resume driving when not requiring pain medication and patient feels comfortable with doing so.   02/08/24 0801             Signed: [name] 02/08/2024, 8:02 AM

## 2024-02-08 NOTE — Evaluation (Signed)
 Physical Therapy Evaluation  Patient Details Name: Judy White MRN: [id number] DOB: 06/22/1940 Today's Date: 02/08/2024  History of Present Illness  Judy White is an 83 y/o admitted for planned laminectomy L3-L4 with decompression of the central spinal canal due to back pain and BLE pain/weakness. Previously underwent decompression and fusion at L4-L5. PMH includes Agoraphobia, Anxiety, Arthritis, COPD, Depressive disorder, Dysrhythmia, hiatal hernia, HTN, Myalgia and myositis, Osteopenia, Overactive bladder, Persistent atrial fibrillation, Presence of Watchman left atrial appendage closure device, Pulmonary embolism (10/08/2015), SDH (11/12/2005), Posterior fusion lumbar spine (Bilateral, 07/2014); Shoulder arthroscopy w/ rotator cuff repair (Left); Shoulder surgery (Right); Cardiac catheterization (06/28/2017); Bronchoscopy (01/24/2016); ABLATION (06/28/2017); Total knee arthroplasty (Right, 12/27/2018); I & D knee with poly exchange (Right, 03/16/2023)  Clinical Impression  Pt admitted with above diagnosis. At the time of PT eval, pt was able to demonstrate transfers and ambulation with gross CGA and SPC for support. Pt was educated on precautions, brace application/wearing schedule, appropriate activity progression, and car transfer. Pt currently with functional limitations due to the deficits listed below (see PT Problem List). Pt will benefit from skilled PT to increase their independence and safety with mobility to allow discharge to the venue listed below.          If plan is discharge home, recommend the following: A little help with walking and/or transfers;A little help with bathing/dressing/bathroom;Assistance with cooking/housework;Assist for transportation;Help with stairs or ramp for entrance   Can travel by private vehicle        Equipment Recommendations None recommended by PT  Recommendations for Other Services       Functional Status Assessment Patient has had a recent  decline in their functional status and demonstrates the ability to make significant improvements in function in a reasonable and predictable amount of time.     Precautions / Restrictions Precautions Precautions: Back Precaution Booklet Issued: Yes (comment) Recall of Precautions/Restrictions: Impaired Precaution/Restrictions Comments: mod cues to maintain during functional tasks Required Braces or Orthoses:  (no brace per orders) Restrictions Weight Bearing Restrictions Per Provider Order: No      Mobility  Bed Mobility               General bed mobility comments: Pt was received sitting up in recliner. Verbally reviewed log roll technique.    Transfers Overall transfer level: Needs assistance Equipment used: Straight cane (hurry cane) Transfers: Sit to/from Stand Sit to Stand: Contact Guard Assist           General transfer comment: Multiple practice reps to improve posture. VC's for hand placement on seated surface for safety and wide BOS.    Ambulation/Gait Ambulation/Gait assistance: Contact guard assist Gait Distance (Feet): 250 Feet Assistive device: Straight cane Gait Pattern/deviations: Step-through pattern, Decreased stride length, Trunk flexed Gait velocity: Decreased Gait velocity interpretation: <1.31 ft/sec, indicative of household ambulator   General Gait Details: VC's for maintenance of precautions, improved posture and forward gaze. Pt twisting to look around hall and speak to passersby. No overt LOB noted, however mildly unsteady when not using cane properly. When navigating in tighter spaces, pt dragging cane behind her.  Stairs            Wheelchair Mobility     Tilt Bed    Modified Rankin (Stroke Patients Only)       Balance Overall balance assessment: Mild deficits observed, not formally tested  Pertinent Vitals/Pain Pain Assessment Pain Assessment: Faces Faces  Pain Scale: Hurts little more Pain Location: back at incision Pain Descriptors / Indicators: Discomfort, Sore    Home Living Family/patient expects to be discharged to:: Private residence Living Arrangements: Alone Available Help at Discharge: Family;Neighbor;Available 24 hours/day Type of Home: House Home Access: Stairs to enter Entrance Stairs-Rails: None Entrance Stairs-Number of Steps: 2+1   Home Layout: One level Home Equipment: Agricultural Consultant (2 wheels);Rollator (4 wheels);Shower seat - built in;Grab bars - tub/shower;Hand held shower head;Lift chair;Cane - single point      Prior Function Prior Level of Function : Independent/Modified Independent             Mobility Comments: RW or SPC PRN ADLs Comments: struggled with compression socks PTA. but mostly independent     Extremity/Trunk Assessment   Upper Extremity Assessment Upper Extremity Assessment: Defer to OT evaluation    Lower Extremity Assessment Lower Extremity Assessment: Generalized weakness    Cervical / Trunk Assessment Cervical / Trunk Assessment: Back Surgery  Communication   Communication Communication: No apparent difficulties    Cognition Arousal: Alert Behavior During Therapy: Impulsive, Restless   PT - Cognitive impairments: No apparent impairments                         Following commands: Intact       Cueing Cueing Techniques: Verbal cues, Gestural cues     General Comments General comments (skin integrity, edema, etc.): Son present and supportive throughout.    Exercises     Assessment/Plan    PT Assessment Patient needs continued PT services  PT Problem List Decreased strength;Decreased activity tolerance;Decreased balance;Decreased mobility;Decreased knowledge of use of DME;Decreased safety awareness;Decreased knowledge of precautions;Pain       PT Treatment Interventions DME instruction;Gait training;Stair training;Functional mobility training;Therapeutic  activities;Therapeutic exercise;Balance training;Patient/family education    PT Goals (Current goals can be found in the Care Plan section)  Acute Rehab PT Goals Patient Stated Goal: Home at d/c PT Goal Formulation: With patient Potential to Achieve Goals: Good    Frequency Min 5X/week     Co-evaluation               AM-PAC PT 6 Clicks Mobility  Outcome Measure Help needed turning from your back to your side while in a flat bed without using bedrails?: A Little Help needed moving from lying on your back to sitting on the side of a flat bed without using bedrails?: A Little Help needed moving to and from a bed to a chair (including a wheelchair)?: A Little Help needed standing up from a chair using your arms (e.g., wheelchair or bedside chair)?: A Little Help needed to walk in hospital room?: A Little Help needed climbing 3-5 steps with a railing? : A Little 6 Click Score: 18    End of Session Equipment Utilized During Treatment: Gait belt Activity Tolerance: Patient tolerated treatment well Patient left: in chair;with call bell/phone within reach;with family/visitor present Nurse Communication: Mobility status;Patient requests pain meds PT Visit Diagnosis: Unsteadiness on feet (R26.81);Pain Pain - part of body:  (back)    Time: 9069-9046 PT Time Calculation (min) (ACUTE ONLY): 23 min   Charges:   PT Evaluation $PT Eval Low Complexity: 1 Low PT Treatments $Gait Training: 8-22 mins PT General Charges $$ ACUTE PT VISIT: 1 Visit         Leita Sable, PT, DPT Acute Rehabilitation Services Secure Chat Preferred Office: [phone]  Leita JONETTA Sable 02/08/2024, 10:27 AM

## 2024-02-14 ENCOUNTER — Other Ambulatory Visit: Payer: Self-pay

## 2024-02-22 ENCOUNTER — Other Ambulatory Visit (HOSPITAL_COMMUNITY): Payer: Self-pay

## 2024-02-22 ENCOUNTER — Other Ambulatory Visit (HOSPITAL_BASED_OUTPATIENT_CLINIC_OR_DEPARTMENT_OTHER): Payer: Self-pay

## 2024-02-22 ENCOUNTER — Other Ambulatory Visit: Payer: Self-pay

## 2024-02-26 ENCOUNTER — Other Ambulatory Visit (HOSPITAL_COMMUNITY): Payer: Self-pay

## 2024-02-27 ENCOUNTER — Other Ambulatory Visit: Payer: Self-pay

## 2024-03-03 ENCOUNTER — Other Ambulatory Visit: Payer: Self-pay | Admitting: Internal Medicine

## 2024-03-05 MED ORDER — FUROSEMIDE 20 MG PO TABS
20.0000 mg | ORAL_TABLET | Freq: Every day | ORAL | 1 refills | Status: AC
  Filled 2024-03-05: qty 90, 90d supply, fill #0

## 2024-03-06 ENCOUNTER — Other Ambulatory Visit (HOSPITAL_COMMUNITY): Payer: Self-pay

## 2024-03-07 ENCOUNTER — Other Ambulatory Visit (HOSPITAL_COMMUNITY): Payer: Self-pay

## 2024-03-07 ENCOUNTER — Other Ambulatory Visit: Payer: Self-pay

## 2024-03-10 ENCOUNTER — Other Ambulatory Visit: Payer: Self-pay

## 2024-03-11 ENCOUNTER — Encounter: Payer: Self-pay | Admitting: Physician Assistant

## 2024-03-11 ENCOUNTER — Inpatient Hospital Stay: Admitting: Physician Assistant

## 2024-03-11 ENCOUNTER — Inpatient Hospital Stay

## 2024-03-11 VITALS — BP 116/66 | HR 80 | Temp 97.6°F | Resp 16 | Ht 66.0 in | Wt 199.8 lb

## 2024-03-11 DIAGNOSIS — D72829 Elevated white blood cell count, unspecified: Secondary | ICD-10-CM

## 2024-03-11 DIAGNOSIS — G4733 Obstructive sleep apnea (adult) (pediatric): Secondary | ICD-10-CM | POA: Diagnosis not present

## 2024-03-11 DIAGNOSIS — Z86718 Personal history of other venous thrombosis and embolism: Secondary | ICD-10-CM | POA: Insufficient documentation

## 2024-03-11 DIAGNOSIS — Z86711 Personal history of pulmonary embolism: Secondary | ICD-10-CM | POA: Insufficient documentation

## 2024-03-11 DIAGNOSIS — I10 Essential (primary) hypertension: Secondary | ICD-10-CM | POA: Insufficient documentation

## 2024-03-11 DIAGNOSIS — E7800 Pure hypercholesterolemia, unspecified: Secondary | ICD-10-CM | POA: Insufficient documentation

## 2024-03-11 DIAGNOSIS — Z79899 Other long term (current) drug therapy: Secondary | ICD-10-CM | POA: Insufficient documentation

## 2024-03-11 DIAGNOSIS — Z87891 Personal history of nicotine dependence: Secondary | ICD-10-CM | POA: Diagnosis not present

## 2024-03-11 LAB — CMP (CANCER CENTER ONLY)
ALT: 13 U/L (ref 0–44)
AST: 18 U/L (ref 15–41)
Albumin: 4.3 g/dL (ref 3.5–5.0)
Alkaline Phosphatase: 79 U/L (ref 38–126)
Anion gap: 9 (ref 5–15)
BUN: 17 mg/dL (ref 8–23)
CO2: 29 mmol/L (ref 22–32)
Calcium: 9.2 mg/dL (ref 8.9–10.3)
Chloride: 102 mmol/L (ref 98–111)
Creatinine: 0.92 mg/dL (ref 0.44–1.00)
GFR, Estimated: >60 mL/min
Glucose, Bld: 93 mg/dL (ref 70–99)
Potassium: 4.4 mmol/L (ref 3.5–5.1)
Sodium: 140 mmol/L (ref 135–145)
Total Bilirubin: 0.4 mg/dL (ref 0.0–1.2)
Total Protein: 7 g/dL (ref 6.5–8.1)

## 2024-03-11 LAB — C-REACTIVE PROTEIN: CRP: <0.5 mg/dL

## 2024-03-11 LAB — CBC WITH DIFFERENTIAL (CANCER CENTER ONLY)
Abs Immature Granulocytes: 0.03 K/uL (ref 0.00–0.07)
Basophils Absolute: 0.1 K/uL (ref 0.0–0.1)
Basophils Relative: 1 %
Eosinophils Absolute: 0.1 K/uL (ref 0.0–0.5)
Eosinophils Relative: 1 %
HCT: 40.6 % (ref 36.0–46.0)
Hemoglobin: 13.5 g/dL (ref 12.0–15.0)
Immature Granulocytes: 0 %
Lymphocytes Relative: 19 %
Lymphs Abs: 1.7 K/uL (ref 0.7–4.0)
MCH: 29.7 pg (ref 26.0–34.0)
MCHC: 33.3 g/dL (ref 30.0–36.0)
MCV: 89.2 fL (ref 80.0–100.0)
Monocytes Absolute: 0.6 K/uL (ref 0.1–1.0)
Monocytes Relative: 7 %
Neutro Abs: 6.4 K/uL (ref 1.7–7.7)
Neutrophils Relative %: 72 %
Platelet Count: 208 K/uL (ref 150–400)
RBC: 4.55 MIL/uL (ref 3.87–5.11)
RDW: 13.1 % (ref 11.5–15.5)
WBC Count: 8.9 K/uL (ref 4.0–10.5)
nRBC: 0 % (ref 0.0–0.2)

## 2024-03-11 LAB — TECHNOLOGIST SMEAR REVIEW: Plt Morphology: NORMAL

## 2024-03-11 LAB — URIC ACID: Uric Acid, Serum: 6.5 mg/dL (ref 2.5–7.1)

## 2024-03-11 LAB — SEDIMENTATION RATE: Sed Rate: 8 mm/h (ref 0–22)

## 2024-03-11 NOTE — Progress Notes (Signed)
 " [facility] [phone]   [phone]  INITIAL CONSULT NOTE  Patient Care Team: Dwight Trula SQUIBB, MD as PCP - General (Internal Medicine) Okey Vina GAILS, MD as PCP - Cardiology (Cardiology)   CHIEF COMPLAINTS/PURPOSE OF CONSULTATION:  Intermittent leukocytosis  HISTORY OF PRESENTING ILLNESS:  Judy White 84 y.o. female with medical history significant for hypercholesteremia, hypertension, GERD, DVT/PE, COPD and sleep apnea currently using CPAP who presents to the clinic for recurrent episodes of leukocytosis. She is accompanied by her son for this visit.   Judy White reports having intermittent episodes of leukocytosis starting January 2025 after presenting with synovitis of the right knee. She underwent right TKA several  years ago with no complications after surgery. Gram stains and cultures from the fluid from her right knee did not grow any organisms. She was treated with IV antibiotics until end of February 2025. She presented again in November 2025 with fluid in the right knee with leukocytosis. Fluid analysis again did not show any growth of organism.   Judy White reports that she isn't exhibiting any signs of infection at this time including fevers, cough, sore throat or urinary symptoms. She feels there is start of fluid accumulation in her knee again. She has no B symptoms including fevers, night sweats or weight loss. She denies any steroid therapy when she presents with leukocytosis. She is not a current smoker and is compliant with CPAP usage for obstructive sleep apnea. She has no other complaints. Rest of the ROS is below.   MEDICAL HISTORY:  Past Medical History:  Diagnosis Date   Agoraphobia    Anxiety    Arthritis    normal for the age; nothing gives me problems (01/24/2016)   COPD (chronic obstructive pulmonary disease) (HCC)    mild (01/24/2016)   Depressive disorder, not elsewhere classified    DVT (deep venous thrombosis) (HCC)  10/08/2015   they said the blood clot went from one of my legs to my lung   Dysrhythmia    hx a-fib - had ablation at [facility], no current problems   GERD (gastroesophageal reflux disease)    diet control, no meds   History of hiatal hernia    small one (01/24/2016)   Hypercholesteremia    Hypertension    on metoprolol    Myalgia and myositis, unspecified    Osteopenia    Overactive bladder    Persistent atrial fibrillation (HCC)    a. s/p Watchman implant 12/2014   Presence of Watchman left atrial appendage closure device    Pulmonary embolism (HCC) 10/08/2015   SDH (subdural hematoma) (HCC) 11/12/2005   in setting of warfarin   Sleep apnea    USES CPAP NIGHTLY    SURGICAL HISTORY: Past Surgical History:  Procedure Laterality Date   APPENDECTOMY  1983   ATRIAL FIBRILLATION ABLATION  06/28/2017   PER PATIENT SUCCESSFUL ABLATION , [facility] , LOV JANUARY 2020   BRONCHOSCOPY  01/24/2016   CARDIAC CATHETERIZATION  06/28/2017   in [place]   CATARACT EXTRACTION W/ INTRAOCULAR LENS  IMPLANT, BILATERAL Bilateral    CESAREAN SECTION  1967   COLONOSCOPY     x several   I & D KNEE WITH POLY EXCHANGE Right 03/16/2023   Procedure: IRRIGATION AND DEBRIDEMENT KNEE WITH POLY EXCHANGE;  Surgeon: Vernetta Lonni GRADE, MD;  Location: [facility] ORS;  Service: Orthopedics;  Laterality: Right;   LEFT ATRIAL APPENDAGE OCCLUSION N/A 12/31/2014   Procedure: LEFT ATRIAL APPENDAGE  OCCLUSION;  Surgeon: Lynwood Rakers, MD;  Location: [facility] INVASIVE CV LAB;  Service: Cardiovascular;  Laterality: N/A;   LUMBAR LAMINECTOMY/DECOMPRESSION MICRODISCECTOMY N/A 02/07/2024   Procedure: LUMBAR SUBLAMINAR /DECOMPRESSION LUMBAR THREE-LUMBAR FOUR;  Surgeon: Colon Shove, MD;  Location: [facility] OR;  Service: Neurosurgery;  Laterality: N/A;   POSTERIOR FUSION LUMBAR SPINE Bilateral 07/2014   SHOULDER ARTHROSCOPY W/ ROTATOR CUFF REPAIR Left    SHOULDER SURGERY Right    tendon broke; couldn't be repaired   TEE  WITHOUT CARDIOVERSION N/A 12/22/2014   Procedure: TRANSESOPHAGEAL ECHOCARDIOGRAM (TEE);  Surgeon: Vinie JAYSON Maxcy, MD;  Location: [facility] ENDOSCOPY;  Service: Cardiovascular;  Laterality: N/A;   TEE WITHOUT CARDIOVERSION N/A 02/16/2015   post Watchman TEE with good seal and no leak around device    TONSILLECTOMY     TOOTH EXTRACTION  05/2018   TOTAL ABDOMINAL HYSTERECTOMY     TOTAL KNEE ARTHROPLASTY Right 12/27/2018   Procedure: RIGHT TOTAL KNEE ARTHROPLASTY;  Surgeon: Vernetta Lonni GRADE, MD;  Location: [facility];  Service: Orthopedics;  Laterality: Right;   VIDEO BRONCHOSCOPY Bilateral 01/24/2016   Procedure: VIDEO BRONCHOSCOPY WITH FLUORO;  Surgeon: Lamar GORMAN Chris, MD;  Location: [facility] ENDOSCOPY;  Service: Cardiopulmonary;  Laterality: Bilateral;   WISDOM TOOTH EXTRACTION      SOCIAL HISTORY: Social History   Socioeconomic History   Marital status: Widowed    Spouse name: Not on file   Number of children: Not on file   Years of education: Not on file   Highest education level: Not on file  Occupational History   Not on file  Tobacco Use   Smoking status: Former    Current packs/day: 0.00    Average packs/day: 0.5 packs/day for 60.0 years (30.0 ttl pk-yrs)    Types: Cigarettes    Start date: 10/08/1955    Quit date: 10/08/2015    Years since quitting: 8.4   Smokeless tobacco: Never  Vaping Use   Vaping status: Never Used  Substance and Sexual Activity   Alcohol use: Not Currently    Comment: occ mixed drink   Drug use: No   Sexual activity: Not Currently    Birth control/protection: Surgical    Comment: Hysterectomy  Other Topics Concern   Not on file  Social History Narrative   Not on file   Social Drivers of Health   Tobacco Use: Medium Risk (02/07/2024)   Patient History    Smoking Tobacco Use: Former    Smokeless Tobacco Use: Never    Passive Exposure: Not on Actuary Strain: Not on file  Food Insecurity: No Food Insecurity (03/11/2024)   Epic     Worried About Programme Researcher, Broadcasting/film/video in the Last Year: Never true    Ran Out of Food in the Last Year: Never true  Transportation Needs: No Transportation Needs (03/11/2024)   Epic    Lack of Transportation (Medical): No    Lack of Transportation (Non-Medical): No  Physical Activity: Not on file  Stress: Not on file  Social Connections: Unknown (03/19/2023)   Social Connection and Isolation Panel    Frequency of Communication with Friends and Family: More than three times a week    Frequency of Social Gatherings with Friends and Family: More than three times a week    Attends Religious Services: Patient declined    Database Administrator or Organizations: Yes    Attends Banker Meetings: More than 4 times per year    Marital Status: Widowed  Intimate Partner Violence: Not At Risk (03/11/2024)   Epic    Fear of Current or Ex-Partner: No    Emotionally Abused: No    Physically Abused: No    Sexually Abused: No  Depression (PHQ2-9): Low Risk (03/11/2024)   Depression (PHQ2-9)    PHQ-2 Score: 1  Alcohol Screen: Not on file  Housing: Unknown (03/11/2024)   Epic    Unable to Pay for Housing in the Last Year: No    Number of Times Moved in the Last Year: Not on file    Homeless in the Last Year: No  Utilities: Not At Risk (03/11/2024)   Epic    Threatened with loss of utilities: No  Health Literacy: Not on file    FAMILY HISTORY: Family History  Problem Relation Age of Onset   Aneurysm Father    Hypotension Mother    Neuropathy Brother    Heart disease Brother    Cancer Other        maternal side   Heart Problems Other        paternal side   Heart attack Neg Hx     ALLERGIES:  is allergic to rosuvastatin , statins, and bupropion.  MEDICATIONS:  Current Outpatient Medications  Medication Sig Dispense Refill   acetaminophen  (TYLENOL ) 500 MG tablet Take 500-1,000 mg by mouth every 6 (six) hours as needed for moderate pain (pain score 4-6) or headache.     albuterol   (VENTOLIN  HFA) 108 (90 Base) MCG/ACT inhaler Inhale 2 puffs into the lungs every 4 (four) hours as needed for wheezing or shortness of breath. 8 g 3   aspirin  81 MG chewable tablet Chew 81 mg by mouth every Monday, Wednesday, and Friday.     budesonide -glycopyrrolate -formoterol  (BREZTRI  AEROSPHERE) 160-9-4.8 MCG/ACT AERO inhaler Inhale 2 puffs into the lungs 2 (two) times daily. 10.7 g 3   Calcium -Vitamin D  (CALTRATE 600 PLUS-VIT D PO) Take 1 tablet by mouth daily.      Cholecalciferol  (VITAMIN D3) 2000 units TABS Take 2,000 Units by mouth daily.     clonazePAM  (KLONOPIN ) 1 MG tablet Take 1.5 tablets (1.5 mg total) by mouth in the morning. May also take 0.5 tablets (0.5 mg total) daily as needed. 60 tablet 5   cyanocobalamin  (VITAMIN B12) 1000 MCG tablet Take 1,000 mcg by mouth daily.     FLUoxetine  (PROZAC ) 20 MG capsule Take 1 capsule (20 mg total) by mouth daily with breakfast. 90 capsule 2   furosemide  (LASIX ) 20 MG tablet Take 1 tablet (20 mg total) by mouth daily. 90 tablet 1   methocarbamol  (ROBAXIN ) 500 MG tablet Take 1 tablet (500 mg total) by mouth every 6 (six) hours as needed for muscle spasms. 30 tablet 3   metoprolol  succinate (TOPROL -XL) 25 MG 24 hr tablet Take 0.5 tablets (12.5 mg total) by mouth daily. 45 tablet 3   Multiple Vitamins-Minerals (CENTRUM SILVER ADULT 50+) TABS Take 1 tablet by mouth daily.     Multiple Vitamins-Minerals (PRESERVISION AREDS PO) Take 1 each by mouth daily.     NON FORMULARY      oxybutynin  (DITROPAN -XL) 10 MG 24 hr tablet Take 1 tablet (10 mg total) by mouth daily. 90 tablet 3   Potassium 99 MG TABS Take 99 mg by mouth daily.     vitamin C  (ASCORBIC ACID ) 500 MG tablet Take 500 mg by mouth daily.     vitamin E  180 MG (400 UNITS) capsule Take 400 Units daily by mouth.     clonazePAM  (  KLONOPIN ) 1 MG tablet Take 1 tablet (1 mg total) by mouth 2 (two) times daily. (Patient not taking: Reported on 03/11/2024) 60 tablet 3   clonazePAM  (KLONOPIN ) 1 MG tablet  Take 1 tablet (1 mg total) by mouth 2 (two) times daily. (Patient not taking: Reported on 03/11/2024) 60 tablet 5   clonazePAM  (KLONOPIN ) 1 MG tablet Take 1 tablet (1 mg total) by mouth 2 (two) times daily. (Patient not taking: Reported on 03/11/2024) 60 tablet 5   Evolocumab  (REPATHA ) 140 MG/ML SOSY Inject 140 mg into the skin every 14 (fourteen) days. (Patient not taking: Reported on 03/11/2024) 2 mL 1   FLUoxetine  (PROZAC ) 20 MG capsule Take 1 capsule (20 mg total) by mouth every morning with food. (Patient not taking: Reported on 03/11/2024) 90 capsule 2   FLUoxetine  (PROZAC ) 20 MG capsule Take 1 capsule (20 mg total) by mouth every morning with food. (Patient not taking: Reported on 03/11/2024) 90 capsule 2   methocarbamol  (ROBAXIN ) 500 MG tablet take one tablet by mouth every  six hours as needed for spasm 30 tablet 4   No current facility-administered medications for this visit.    REVIEW OF SYSTEMS:   Constitutional: ( - ) fevers, ( - )  chills , ( - ) night sweats Eyes: ( - ) blurriness of vision, ( - ) double vision, ( - ) watery eyes Ears, nose, mouth, throat, and face: ( - ) mucositis, ( - ) sore throat Respiratory: ( - ) cough, ( - ) dyspnea, ( - ) wheezes Cardiovascular: ( - ) palpitation, ( - ) chest discomfort, ( - ) lower extremity swelling Gastrointestinal:  ( - ) nausea, ( - ) heartburn, ( - ) change in bowel habits Skin: ( - ) abnormal skin rashes Lymphatics: ( - ) new lymphadenopathy, ( - ) easy bruising Neurological: ( - ) numbness, ( - ) tingling, ( - ) new weaknesses Behavioral/Psych: ( - ) mood change, ( - ) new changes  All other systems were reviewed with the patient and are negative.  PHYSICAL EXAMINATION: ECOG PERFORMANCE STATUS: 1 - Symptomatic but completely ambulatory  Vitals:   03/11/24 1108  BP: 116/66  Pulse: 80  Resp: 16  Temp: 97.6 F (36.4 C)  SpO2: 95%   Filed Weights   03/11/24 1108  Weight: 199 lb 12.8 oz (90.6 kg)    GENERAL: well appearing  female in NAD  SKIN: skin color, texture, turgor are normal, no rashes or significant lesions EYES: conjunctiva are pink and non-injected, sclera clear OROPHARYNX: no exudate, no erythema; lips, buccal mucosa, and tongue normal  NECK: supple, non-tender LYMPH:  no palpable lymphadenopathy in the cervical or supraclavicular lymph nodes.  LUNGS: clear to auscultation and percussion with normal breathing effort HEART: regular rate & rhythm and no murmurs and no lower extremity edema Musculoskeletal: no cyanosis of digits and no clubbing  PSYCH: alert & oriented x 3, fluent speech NEURO: no focal motor/sensory deficits  LABORATORY DATA:  I have reviewed the data as listed    Latest Ref Rng & Units 03/11/2024   12:22 PM 01/14/2024   10:44 AM 01/09/2024    1:55 PM  CBC  WBC 4.0 - 10.5 K/uL 8.9  7.8    WILL FOLLOW  P 19.8   Hemoglobin 12.0 - 15.0 g/dL 86.4  86.1    WILL FOLLOW  P 13.4   Hematocrit 36.0 - 46.0 % 40.6  42.7    WILL FOLLOW  P 40.8  Platelets 150 - 400 K/uL 208  280    WILL FOLLOW  P 195     P Preliminary result       Latest Ref Rng & Units 03/11/2024   12:22 PM 01/09/2024    1:55 PM 05/30/2023   11:28 AM  CMP  Glucose 70 - 99 mg/dL 93  862  99   BUN 8 - 23 mg/dL 17  17  21    Creatinine 0.44 - 1.00 mg/dL 9.07  9.00  9.18   Sodium 135 - 145 mmol/L 140  136  138   Potassium 3.5 - 5.1 mmol/L 4.4  4.2  4.5   Chloride 98 - 111 mmol/L 102  99  103   CO2 22 - 32 mmol/L 29  27  25    Calcium  8.9 - 10.3 mg/dL 9.2  8.9  9.2   Total Protein 6.5 - 8.1 g/dL 7.0   6.4   Total Bilirubin 0.0 - 1.2 mg/dL 0.4   0.3   Alkaline Phos 38 - 126 U/L 79     AST 15 - 41 U/L 18   13   ALT 0 - 44 U/L 13   10     RADIOGRAPHIC STUDIES: I have personally reviewed the radiological images as listed and agreed with the findings in the report. No results found.  ASSESSMENT & PLAN Judy White is a 84 y.o. female who presents to the hematology clinic for evaluation of recurrent leukocytosis.     Neutrophilic predominant leukocytosis is a common hematological finding with numerous possible etiologies. Possible causes include chronic inflammation, infection (transient or chronic), medication side effect (particularly steroids), myeloproliferative neoplasm, or idiopathic. The most common cause of chronic inflammation causing leukocytosis is cigarette smoking. Obstructive sleep apnea can also produce a similar pattern of leukocytosis. If there is no clear cause (or if there is an increase in other cell lines) a myeloproliferative neoplasm workup should be conducted with JAK2 w/ reflex and BCR/ABL FISH.  Idiopathic cases should be observed for stability.    # Leukocytosis, Neutrophilic Predominant  --patient has OSA and compliant with CPAP use.  --previous gram stains/cultures of fluid from right knee as not resulted in growth of organism.  --no focal signs concerning for infection.   --today will order CBC, CMP, and LDH  --inflammatory workup with ESR, CRP, ANA --will proceed with MPN workup to include JAK2 w/ reflex and BCR/ABL FISH --RTC once workup is complete.   Orders Placed This Encounter  Procedures   CBC with Differential (Cancer Center Only)    Standing Status:   Future    Number of Occurrences:   1    Expiration Date:   03/11/2025   CMP (Cancer Center only)    Standing Status:   Future    Number of Occurrences:   1    Expiration Date:   03/11/2025   Technologist smear review    Clinical information::   leukocytosis   Sedimentation rate    Standing Status:   Future    Number of Occurrences:   1    Expiration Date:   03/11/2025   C-reactive protein    Standing Status:   Future    Number of Occurrences:   1    Expiration Date:   03/11/2025   Uric acid    Standing Status:   Future    Number of Occurrences:   1    Expiration Date:   03/11/2025   ANA, IFA (with reflex)  Standing Status:   Future    Number of Occurrences:   1    Expiration Date:   03/11/2025   JAK2  V617 reflex CALR/MPL/E12-15    Standing Status:   Future    Number of Occurrences:   1    Expiration Date:   03/11/2025   BCR-ABL1 FISH    Standing Status:   Future    Number of Occurrences:   1    Expiration Date:   03/11/2025    All questions were answered. The patient knows to call the clinic with any problems, questions or concerns.  I have spent a total of 60 minutes minutes of face-to-face and non-face-to-face time, preparing to see the patient, obtaining and/or reviewing separately obtained history, performing a medically appropriate examination, counseling and educating the patient, ordering tests/procedures, documenting clinical information in the electronic health record, independently interpreting results and communicating results to the patient, and care coordination.   Johnston Police, PA-C Department of Hematology/Oncology [facility] Phone: [phone]  I have read the above note and personally examined the patient. I agree with the assessment and plan as noted above.  Briefly Judy White is an 84 year old female who presents for evaluation of intermittent leukocytosis.  Today we discussed the possible etiologies of leukocytosis including infection, inflammation, and underlying bone marrow disorder.  At this time I do believe her findings are most consistent with recurrent inflammation of her knee.  She is not having any other focal infectious symptoms such as fevers, chills, sweats, nausea, vomiting or diarrhea.  In order rule out MPN we will also order MPN testing.  The patient voiced understanding of our findings and recommendations moving forward.  Additionally I do think she may benefit from a rheumatological evaluation of her knee.     Judy IVAR Kidney, MD Department of Hematology/Oncology [facility] Phone: [phone] Pager: [phone] Email: [email or web address]  "

## 2024-03-12 LAB — ANTINUCLEAR ANTIBODIES, IFA: ANA Ab, IFA: NEGATIVE

## 2024-03-14 ENCOUNTER — Other Ambulatory Visit: Payer: Self-pay

## 2024-03-14 ENCOUNTER — Other Ambulatory Visit (HOSPITAL_COMMUNITY): Payer: Self-pay

## 2024-03-14 MED ORDER — METHOCARBAMOL 500 MG PO TABS
500.0000 mg | ORAL_TABLET | Freq: Four times a day (QID) | ORAL | 4 refills | Status: AC | PRN
  Filled 2024-03-14: qty 30, 8d supply, fill #0

## 2024-03-15 LAB — BCR-ABL1 FISH
Cells Analyzed: 200
Cells Counted: 200

## 2024-03-17 ENCOUNTER — Other Ambulatory Visit (HOSPITAL_COMMUNITY): Payer: Self-pay

## 2024-03-19 LAB — JAK2 V617F RFX CALR/MPL/E12-15

## 2024-03-19 LAB — CALR +MPL + E12-E15  (REFLEX)

## 2024-03-24 ENCOUNTER — Other Ambulatory Visit (HOSPITAL_COMMUNITY): Payer: Self-pay

## 2024-03-25 ENCOUNTER — Telehealth: Payer: Self-pay | Admitting: Physician Assistant

## 2024-03-25 ENCOUNTER — Other Ambulatory Visit: Payer: Self-pay

## 2024-03-25 NOTE — Telephone Encounter (Signed)
 I called and spoke to patient and her son, Judy White, to review the lab results from 03/11/2024. Findings showed no evidence of leukocytosis or other lab abnormalities. Remaining workup showed no evidence of elevated inflammatory markers, no driver mutations with negative MPN panel and no evidence of BCR/ABL gene fusion. No further hematologic workup is required. Prior leukocytosis is most likely secondary to inflammatory/infectious process. Patient can return to our clinic as needed. Judy White and her son expressed understanding of the plan provided.

## 2024-03-28 ENCOUNTER — Other Ambulatory Visit: Payer: Self-pay

## 2024-03-31 ENCOUNTER — Other Ambulatory Visit: Payer: Self-pay

## 2024-03-31 ENCOUNTER — Other Ambulatory Visit (HOSPITAL_COMMUNITY): Payer: Self-pay

## 2024-05-02 ENCOUNTER — Ambulatory Visit: Admitting: Emergency Medicine
